# Patient Record
Sex: Female | Born: 1980 | Race: Black or African American | Hispanic: No | Marital: Married | Smoking: Never smoker
Health system: Southern US, Community
[De-identification: ages and names within clinical notes are randomized; demographics above are authoritative.]

## PROBLEM LIST (undated history)

## (undated) ENCOUNTER — Inpatient Hospital Stay (HOSPITAL_COMMUNITY): Payer: Self-pay

## (undated) DIAGNOSIS — I10 Essential (primary) hypertension: Secondary | ICD-10-CM

## (undated) DIAGNOSIS — F419 Anxiety disorder, unspecified: Secondary | ICD-10-CM

## (undated) DIAGNOSIS — L732 Hidradenitis suppurativa: Secondary | ICD-10-CM

## (undated) DIAGNOSIS — L0390 Cellulitis, unspecified: Secondary | ICD-10-CM

## (undated) DIAGNOSIS — E669 Obesity, unspecified: Secondary | ICD-10-CM

## (undated) DIAGNOSIS — M109 Gout, unspecified: Secondary | ICD-10-CM

## (undated) DIAGNOSIS — I878 Other specified disorders of veins: Secondary | ICD-10-CM

## (undated) DIAGNOSIS — F32A Depression, unspecified: Secondary | ICD-10-CM

## (undated) DIAGNOSIS — F329 Major depressive disorder, single episode, unspecified: Secondary | ICD-10-CM

## (undated) DIAGNOSIS — I872 Venous insufficiency (chronic) (peripheral): Secondary | ICD-10-CM

## (undated) HISTORY — DX: Depression, unspecified: F32.A

## (undated) HISTORY — DX: Hidradenitis suppurativa: L73.2

## (undated) HISTORY — PX: TUBAL LIGATION: SHX77

## (undated) HISTORY — DX: Major depressive disorder, single episode, unspecified: F32.9

## (undated) SURGERY — Surgical Case
Anesthesia: Regional | Laterality: Bilateral

---

## 2004-11-30 ENCOUNTER — Ambulatory Visit (HOSPITAL_COMMUNITY): Admission: RE | Admit: 2004-11-30 | Discharge: 2004-11-30 | Payer: Self-pay | Admitting: Obstetrics and Gynecology

## 2004-12-03 ENCOUNTER — Observation Stay (HOSPITAL_COMMUNITY): Admission: AD | Admit: 2004-12-03 | Discharge: 2004-12-03 | Payer: Self-pay | Admitting: Obstetrics & Gynecology

## 2004-12-04 ENCOUNTER — Inpatient Hospital Stay (HOSPITAL_COMMUNITY): Admission: AD | Admit: 2004-12-04 | Discharge: 2004-12-12 | Payer: Self-pay | Admitting: Obstetrics & Gynecology

## 2004-12-07 ENCOUNTER — Encounter: Payer: Self-pay | Admitting: *Deleted

## 2004-12-09 ENCOUNTER — Ambulatory Visit: Payer: Self-pay | Admitting: *Deleted

## 2005-08-01 ENCOUNTER — Emergency Department (HOSPITAL_COMMUNITY): Admission: EM | Admit: 2005-08-01 | Discharge: 2005-08-01 | Payer: Self-pay | Admitting: Emergency Medicine

## 2005-10-09 ENCOUNTER — Emergency Department (HOSPITAL_COMMUNITY): Admission: EM | Admit: 2005-10-09 | Discharge: 2005-10-09 | Payer: Self-pay | Admitting: Emergency Medicine

## 2005-12-02 ENCOUNTER — Inpatient Hospital Stay (HOSPITAL_COMMUNITY): Admission: RE | Admit: 2005-12-02 | Discharge: 2005-12-04 | Payer: Self-pay | Admitting: Obstetrics and Gynecology

## 2005-12-06 ENCOUNTER — Observation Stay (HOSPITAL_COMMUNITY): Admission: AD | Admit: 2005-12-06 | Discharge: 2005-12-06 | Payer: Self-pay | Admitting: Obstetrics and Gynecology

## 2006-01-07 ENCOUNTER — Ambulatory Visit (HOSPITAL_COMMUNITY): Admission: AD | Admit: 2006-01-07 | Discharge: 2006-01-07 | Payer: Self-pay | Admitting: Obstetrics and Gynecology

## 2006-01-15 ENCOUNTER — Ambulatory Visit (HOSPITAL_COMMUNITY): Admission: RE | Admit: 2006-01-15 | Discharge: 2006-01-15 | Payer: Self-pay | Admitting: Obstetrics and Gynecology

## 2006-01-17 ENCOUNTER — Ambulatory Visit (HOSPITAL_COMMUNITY): Admission: AD | Admit: 2006-01-17 | Discharge: 2006-01-17 | Payer: Self-pay | Admitting: Obstetrics and Gynecology

## 2006-01-19 ENCOUNTER — Inpatient Hospital Stay (HOSPITAL_COMMUNITY): Admission: RE | Admit: 2006-01-19 | Discharge: 2006-01-20 | Payer: Self-pay | Admitting: Obstetrics and Gynecology

## 2006-01-27 ENCOUNTER — Ambulatory Visit (HOSPITAL_COMMUNITY): Admission: RE | Admit: 2006-01-27 | Discharge: 2006-01-27 | Payer: Self-pay | Admitting: Obstetrics and Gynecology

## 2006-02-09 ENCOUNTER — Ambulatory Visit (HOSPITAL_COMMUNITY): Admission: AD | Admit: 2006-02-09 | Discharge: 2006-02-09 | Payer: Self-pay | Admitting: Obstetrics and Gynecology

## 2006-02-11 ENCOUNTER — Observation Stay (HOSPITAL_COMMUNITY): Admission: AD | Admit: 2006-02-11 | Discharge: 2006-02-11 | Payer: Self-pay | Admitting: Obstetrics and Gynecology

## 2006-02-12 ENCOUNTER — Inpatient Hospital Stay (HOSPITAL_COMMUNITY): Admission: AD | Admit: 2006-02-12 | Discharge: 2006-02-15 | Payer: Self-pay | Admitting: Obstetrics and Gynecology

## 2006-02-13 ENCOUNTER — Encounter (INDEPENDENT_AMBULATORY_CARE_PROVIDER_SITE_OTHER): Payer: Self-pay | Admitting: Specialist

## 2006-05-20 ENCOUNTER — Emergency Department (HOSPITAL_COMMUNITY): Admission: EM | Admit: 2006-05-20 | Discharge: 2006-05-21 | Payer: Self-pay | Admitting: Emergency Medicine

## 2007-08-01 ENCOUNTER — Emergency Department (HOSPITAL_COMMUNITY): Admission: EM | Admit: 2007-08-01 | Discharge: 2007-08-01 | Payer: Self-pay | Admitting: Emergency Medicine

## 2007-12-25 ENCOUNTER — Emergency Department (HOSPITAL_COMMUNITY): Admission: EM | Admit: 2007-12-25 | Discharge: 2007-12-26 | Payer: Self-pay | Admitting: Emergency Medicine

## 2008-04-01 ENCOUNTER — Emergency Department (HOSPITAL_COMMUNITY): Admission: EM | Admit: 2008-04-01 | Discharge: 2008-04-01 | Payer: Self-pay | Admitting: Emergency Medicine

## 2010-07-01 ENCOUNTER — Other Ambulatory Visit: Payer: Self-pay

## 2010-07-01 ENCOUNTER — Other Ambulatory Visit: Payer: Self-pay | Admitting: Emergency Medicine

## 2010-07-02 ENCOUNTER — Ambulatory Visit: Payer: Self-pay | Admitting: Advanced Practice Midwife

## 2010-07-02 ENCOUNTER — Inpatient Hospital Stay (HOSPITAL_COMMUNITY): Admission: AD | Admit: 2010-07-02 | Discharge: 2010-07-02 | Payer: Self-pay | Admitting: Obstetrics & Gynecology

## 2010-07-15 ENCOUNTER — Ambulatory Visit: Payer: Self-pay | Admitting: Advanced Practice Midwife

## 2010-07-15 ENCOUNTER — Inpatient Hospital Stay (HOSPITAL_COMMUNITY): Admission: AD | Admit: 2010-07-15 | Discharge: 2010-07-15 | Payer: Self-pay | Admitting: Obstetrics & Gynecology

## 2010-08-05 ENCOUNTER — Emergency Department (HOSPITAL_COMMUNITY): Admission: EM | Admit: 2010-08-05 | Discharge: 2010-08-05 | Payer: Self-pay | Admitting: Emergency Medicine

## 2010-08-11 ENCOUNTER — Ambulatory Visit: Payer: Self-pay | Admitting: Obstetrics & Gynecology

## 2010-08-11 ENCOUNTER — Inpatient Hospital Stay (HOSPITAL_COMMUNITY): Admission: AD | Admit: 2010-08-11 | Discharge: 2010-08-14 | Payer: Self-pay | Admitting: Obstetrics & Gynecology

## 2010-12-16 ENCOUNTER — Encounter: Payer: Self-pay | Admitting: Family Medicine

## 2011-02-08 LAB — GLUCOSE, CAPILLARY
Glucose-Capillary: 118 mg/dL — ABNORMAL HIGH (ref 70–99)
Glucose-Capillary: 126 mg/dL — ABNORMAL HIGH (ref 70–99)
Glucose-Capillary: 130 mg/dL — ABNORMAL HIGH (ref 70–99)
Glucose-Capillary: 134 mg/dL — ABNORMAL HIGH (ref 70–99)
Glucose-Capillary: 135 mg/dL — ABNORMAL HIGH (ref 70–99)
Glucose-Capillary: 137 mg/dL — ABNORMAL HIGH (ref 70–99)
Glucose-Capillary: 146 mg/dL — ABNORMAL HIGH (ref 70–99)
Glucose-Capillary: 160 mg/dL — ABNORMAL HIGH (ref 70–99)
Glucose-Capillary: 164 mg/dL — ABNORMAL HIGH (ref 70–99)
Glucose-Capillary: 94 mg/dL (ref 70–99)

## 2011-02-08 LAB — CBC
HCT: 27.2 % — ABNORMAL LOW (ref 36.0–46.0)
Hemoglobin: 9 g/dL — ABNORMAL LOW (ref 12.0–15.0)
Hemoglobin: 9.8 g/dL — ABNORMAL LOW (ref 12.0–15.0)
MCH: 27 pg (ref 26.0–34.0)
MCH: 27.5 pg (ref 26.0–34.0)
MCHC: 33.1 g/dL (ref 30.0–36.0)
MCV: 83.8 fL (ref 78.0–100.0)
Platelets: 193 10*3/uL (ref 150–400)
Platelets: 241 10*3/uL (ref 150–400)
RBC: 3.27 MIL/uL — ABNORMAL LOW (ref 3.87–5.11)
RBC: 3.63 MIL/uL — ABNORMAL LOW (ref 3.87–5.11)
WBC: 9.5 10*3/uL (ref 4.0–10.5)

## 2011-02-08 LAB — URINALYSIS, ROUTINE W REFLEX MICROSCOPIC
Bilirubin Urine: NEGATIVE
Ketones, ur: NEGATIVE mg/dL
Nitrite: NEGATIVE
Protein, ur: NEGATIVE mg/dL
Urobilinogen, UA: 0.2 mg/dL (ref 0.0–1.0)

## 2011-02-08 LAB — WET PREP, GENITAL
Clue Cells Wet Prep HPF POC: NONE SEEN
Trich, Wet Prep: NONE SEEN
Trich, Wet Prep: NONE SEEN
Yeast Wet Prep HPF POC: NONE SEEN
Yeast Wet Prep HPF POC: NONE SEEN

## 2011-02-08 LAB — URINE MICROSCOPIC-ADD ON

## 2011-02-09 LAB — URINE CULTURE: Colony Count: >=100000

## 2011-02-09 LAB — DIFFERENTIAL
Basophils Absolute: 0 10*3/uL (ref 0.0–0.1)
Eosinophils Absolute: 0.2 10*3/uL (ref 0.0–0.7)
Lymphocytes Relative: 22 % (ref 12–46)
Lymphs Abs: 1.7 10*3/uL (ref 0.7–4.0)
Neutrophils Relative %: 68 % (ref 43–77)

## 2011-02-09 LAB — URINE MICROSCOPIC-ADD ON

## 2011-02-09 LAB — URINALYSIS, ROUTINE W REFLEX MICROSCOPIC
Ketones, ur: NEGATIVE mg/dL
Nitrite: NEGATIVE
Specific Gravity, Urine: 1.02 (ref 1.005–1.030)
Urobilinogen, UA: 1 mg/dL (ref 0.0–1.0)
pH: 6.5 (ref 5.0–8.0)

## 2011-02-09 LAB — BASIC METABOLIC PANEL
Calcium: 8.5 mg/dL (ref 8.4–10.5)
Creatinine, Ser: 0.49 mg/dL (ref 0.4–1.2)
GFR calc Af Amer: >60 mL/min (ref >60)

## 2011-02-09 LAB — CBC
Platelets: 219 10*3/uL (ref 150–400)
RBC: 3.1 MIL/uL — ABNORMAL LOW (ref 3.87–5.11)
WBC: 7.6 10*3/uL (ref 4.0–10.5)

## 2011-02-09 LAB — ABO/RH: ABO/RH(D): B POS

## 2011-02-09 LAB — GLUCOSE, CAPILLARY: Glucose-Capillary: 81 mg/dL (ref 70–99)

## 2011-04-13 NOTE — Discharge Summary (Signed)
Theresa Malone, Theresa Malone                 ACCOUNT NO.:  [id number]   MEDICAL RECORD NO.:  [id number]          PATIENT TYPE:  INP   LOCATION:  9103                          FACILITY:  [facility]   PHYSICIAN:  Adrian Blackwater, MDDATE OF BIRTH:  04-27-1981   DATE OF ADMISSION:  12/06/2004  DATE OF DISCHARGE:  12/12/2004                                 DISCHARGE SUMMARY   ADMISSION DIAGNOSES:  1.  A 33-1/7 week intrauterine pregnancy.  2.  Abdominal pain.  3.  Maternal fever.   DISCHARGE DIAGNOSES:  1.  Normal spontaneous vaginal delivery at 33 weeks and four days      gestational age.  2.  Gestational diabetes mellitus.  3.  Pregnancy induced hypertension.   DISCHARGE MEDICATIONS:  1.  Ibuprofen 600 mg q.6h. p.r.n.  2.  Prenatal vitamins p.o. one daily.  3.  Micronor birth control.   BRIEF HOSPITAL COURSE:  This is a 30 year old G1, para 1-0-0-1 who was  admitted with preterm contractions after being referred by [facility] with  positive fetal fibronectin, previous terbutaline treatment with contractions  and presenting UTI, treated with Keflex.  She was transferred to [facility] with suspicion of chorioamnionitis and preterm contractions.  Patient also _________ gestational diabetes for which she was using insulin  for control and also developed PIH that was closely monitored during  hospitalization.   PROBLEM #1 -  PRETERM CONTRACTIONS:  The patient was placed on continued  monitoring which showed occasional uterine irritability but no contractions  but having continued abdominal pain until December 09, 2004, at six in the  morning when patient spontaneously ruptured membranes.   PROBLEM #2 -  POSSIBLE CHORIOAMNIONITIS DUE TO MATERNAL FEVER AND SUBJECTIVE  ABDOMINAL TENDERNESS WITH VERY VIABLE ABDOMINAL PHYSICAL EXAMINATION AND NO  LOCALIZED POINT OF TENDERNESS:  Patient was placed on a ________ treatment 3  g q.6h. during hospital course.  It was changed to triple  antibiotic,  ampicillin, gentamicin and clindamycin, to continue treatment up to 24 hours  post delivery after blood cultures negative and patient being afebrile.   PROBLEM #3 -  GESTATIONAL DIABETES:  During hospital course the patient  presented with episodes of hypoglycemia that required to stop insulin  treatments.  The patient was discharged with no pharmaceutical treatment for  the diabetes but low carbohydrate diet.   PROBLEM #4 -  PREGNANCY INDUCED HYPERTENSION:  Blood pressure remained  stable during hospital course with normal values.  PIH panel done showed  some abnormalities.  The patient was placed on magnesium therapy as  prevention and therapy continued through 24 hours post delivery.  When  patient shows good diuresis and no symptoms of toxicity on toxic level.   PROCEDURES:  Abdominal and pelvic CT scan with contrast done on December 07, 2004, shows no appendicitis and no other focal abnormalities noted.   LABORATORY DATA:  Admission urinalysis with white blood cells 21 to 50,  small leukocyte esterase.  GBS negative.  White blood cells 8.9, hemoglobin  9.7, hematocrit 28.9, platelets 244.  Sodium 138, potassium 3.5, chloride  111, CO2  20, BUN 6, creatinine 0.8, glucose 111.  Lipase 26, amylase 92.  On  December 08, 2004, wbc 6.9, hemoglobin 9.7, platelets 257.  Creatinine  increased to 0.9.  Uric acid 8.  Liver function tests within normal limits.   DISCHARGE LABORATORY DATA:  Hemoglobin A 96.8, hemoglobin A2 2.6 indicating  possible iron-deficiency anemia.  Urine culture and blood culture negative.  Hemoglobin 9.  AST 20 ALT 13, LDH 177.  _________ 6.5, creatinine 0.7.   CONTRACEPTION:  Micronor pills.   The patient will be in follow-up appointment at [facility] in six weeks.      IM/MEDQ  D:  12/14/2004  T:  12/14/2004  Job:  [id number]

## 2011-04-13 NOTE — H&P (Signed)
[name], [name]                 ACCOUNT NO.:  [id number]   MEDICAL RECORD NO.:  [id number]          PATIENT TYPE:  OIB   LOCATION:  LDR3                          FACILITY:  [facility]   PHYSICIAN:  Tilda Burrow, M.D. DATE OF BIRTH:  10/26/1981   DATE OF ADMISSION:  12/06/2005  DATE OF DISCHARGE:  LH                                HISTORY & PHYSICAL   OBSERVATION NOTE   REASON FOR ADMISSION:  Theresa Malone is a 30 year old gravida 2, para 1, previous  Cesarean section X1 who has a history this week of prior hospitalization for  preterm labor which was arrested.  She was admitted last night with  complaints of right sided pain.  Upon arrival at the hospital and monitoring  for approximately one hour there was no contractions noted and repeat  vaginal examination reveals that there is no change in her cervix.   PHYSICAL EXAMINATION:  VITAL SIGNS:  Stable.  Fetal heart rate pattern is  stable.  PELVIC:  Cervix is unchanged.   PLAN:  We will discharge her home to continue her p.o. Brethine at home.  She is to follow up with Dr. Emelda Fear in the morning for evaluation.      Theresa Malone, Theresa Malone      Tilda Burrow, M.D.  Electronically Signed    DL/MEDQ  D:  82/95/6213  T:  12/06/2005  Job:  [id number]

## 2011-04-13 NOTE — H&P (Signed)
NAMEDRAVEN, LAINE                 ACCOUNT NO.:  [id number]   MEDICAL RECORD NO.:  [id number]          PATIENT TYPE:  OIB   LOCATION:  A415                          FACILITY:  [facility]   PHYSICIAN:  Tilda Burrow, M.D. DATE OF BIRTH:  05/09/1981   DATE OF ADMISSION:  01/07/2006  DATE OF DISCHARGE:  LH                                HISTORY & PHYSICAL   REASON FOR ADMISSION:  Pregnancy at 28 weeks with pelvic pressure.   HISTORY OF PRESENT ILLNESS:  Blia is a 30 year old, gravida 2, para 1, EDC  of February 13, 2006, at 34 weeks.   MEDICAL HISTORY:  Positive for gestational diabetes.   SURGICAL HISTORY:  Negative.   ALLERGIES:  No known allergies.   PHYSICAL EXAMINATION:  VITAL SIGNS:  Stable.  Her pulse is a little  irregular, but I think it is due to the fact that she is very excited.  PELVIC:  Cervix is 1-2 outer os, 1 inner os.  Presenting part is high.  The  cervix is long.  Fetal heart rate pattern is stable.   PLAN:  We are going to observe.  If no cervical change in 1-2 hours, will  discharge her home.      [name], [name]      Tilda Burrow, M.D.  Electronically Signed    DL/MEDQ  D:  04/54/0981  T:  01/07/2006  Job:  [id number]   cc:   [facility]

## 2011-04-13 NOTE — Consult Note (Signed)
NAMEALACIA, REHMANN                 ACCOUNT NO.:  [id number]   MEDICAL RECORD NO.:  [id number]          PATIENT TYPE:  INP   LOCATION:  A413                          FACILITY:  [facility]   PHYSICIAN:  Tilda Burrow, M.D. DATE OF BIRTH:  November 14, 1981   DATE OF CONSULTATION:  DATE OF DISCHARGE:  02/15/2006                                   CONSULTATION   CHIEF COMPLAINT:  Rule out labor.   HISTORY OF PRESENT ILLNESS:  This 30 year old female gravida 2, para 1,  prior preterm vaginal delivery of a 34-week gestation infant is placed in  Labor and Delivery to rule out labor.  She was seen in the office by Dr.  Despina Hidden and sent to Labor and Delivery where monitoring shows some occasional  mild contractions.  Cervix is posterior, long and closed.  The patient has  lots of complaints with pelvic discomfort, and that is the main reason she  is sent to Labor and Delivery.  External monitoring upon arrival shows only  an occasional mild contraction.  She is observed.  Fetal heart rate tracing  meets criteria for reactive __________.  The patient is observed through the  day, but makes no cervical change, and the pain essentially resolves with  continued bed rest.  After reassessment at the end of the work day (7:30  p.m.), the patient was considered stable for outpatient management and to be  brought back to the office in two days to recheck the cervix.   IMPRESSION:  False labor at 39 weeks, 5 days.      Tilda Burrow, M.D.  Electronically Signed     JVF/MEDQ  D:  02/18/2006  T:  02/19/2006  Job:  [id number]

## 2011-04-13 NOTE — H&P (Signed)
Theresa Malone, Theresa Malone                 ACCOUNT NO.:  [id number]   MEDICAL RECORD NO.:  [id number]          PATIENT TYPE:  INP   LOCATION:  LDR1                          FACILITY:  [facility]   PHYSICIAN:  Tilda Burrow, M.D. DATE OF BIRTH:  1980-12-31   DATE OF ADMISSION:  02/12/2006  DATE OF DISCHARGE:  LH                                HISTORY & PHYSICAL   CHIEF COMPLAINT:  Labor.   HISTORY OF PRESENT ILLNESS:  Theresa Malone is a 30 year old gravida 2, para 1 with  an EDC of February 13, 2006 placing her at 39 weeks 6 days' gestation. She  began prenatal care in her first trimester and has had regular visits since  then.   PRENATAL LABORATORY DATA:  Blood type B positive. Rubella is immune. HBSAG,  HIV, RPR,  gonorrhea, chlamydia and group B strep are all negative. She is  also negative for HSV-2. She had an abnormal one-hour GTT with a subsequent  normal three-hour GTT. MSAFP was also within normal limits.   Blood pressures have been 120s to 130s/60s to 80s. Total weight gain has  been 16 pounds. Her fundal height has actually been above normal limits. She  received serial ultrasounds starting at 25 weeks for amniotic fluid indices  and estimated fetal weight due to the increased fundal height; at 32 weeks,  estimated fetal weight was 5 pounds 8 ounce with normal AFI. She had a  repeat ultrasound at 38 weeks which placed her at the 90th to 99th  percentile in weight of an 8 pound 2 ounce baby. Also with a normal amniotic  fluid index. Fundal height is 42 cm.   PAST MEDICAL HISTORY:  Positive for hidradenitis on her body, under her  breasts and the genital area. Pregnancy has worsened it. She has taken  antibiotics from time to time.   PAST SURGICAL HISTORY:  No surgical history.   ALLERGIES:  No known drug allergies.   MEDICATIONS:  No medications at this time.   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 She  denies cigarette smoking, alcohol or drug use.   FAMILY HISTORY:  Noncontributory.   OBSTETRICAL HISTORY:  Positive for a vaginal delivery on January 09, 2005  of a 5-pound, 6-ounce female infant at 32 weeks due to an intra-amniotic  infection.   PHYSICAL EXAMINATION:  HEENT:  Within normal limits.  HEART:  Regular rate and rhythm.  LUNGS:  Clear.  ABDOMEN:  Soft and nontender.   She is having some mild irregular contractions. Fetal heart rate is in the  140s to 150s with average long-term variability. We have not seen any  accelerations at this point, and she does have an occasional very mild less  than 10 second variable. Cervical exam is 3 cm, 50% effaced, -2 station  which is a change from her exam yesterday.   As noted before, she does have multiple weeping areas of the hidradenitis.  She did receive some IV antibiotics for that yesterday. Legs are trace  edema.   IMPRESSION:  Intrauterine pregnancy at 39 weeks  6 days, prodromal labor.   PLAN:  We will plan an amniotomy and Pitocin to augment her labor. She also  is requesting an epidural at this time.      Theresa Malone, C.N.M.      Tilda Burrow, M.D.  Electronically Signed    FC/MEDQ  D:  02/12/2006  T:  02/12/2006  Job:  [id number]   cc:   [facility]   Francoise Schaumann. Milford Cage DO, FAAP  Fax: [phone]

## 2011-04-13 NOTE — Discharge Summary (Signed)
NAMEANTONAE, Theresa Malone                 ACCOUNT NO.:  [id number]   MEDICAL RECORD NO.:  [id number]          PATIENT TYPE:  INP   LOCATION:  LDR4                          FACILITY:  [facility]   PHYSICIAN:  Tilda Burrow, M.D. DATE OF BIRTH:  1981-10-11   DATE OF ADMISSION:  12/02/2005  DATE OF DISCHARGE:  LH                                 DISCHARGE SUMMARY   ADMISSION DIAGNOSIS:  Pregnancy at 29 weeks with preterm labor.   DISCHARGE DIAGNOSES:  1.  Pregnancy at 29 weeks with preterm labor.  2.  Gestational diabetes.   HOSPITAL COURSE:  Theresa Malone responded well with mag sulfate therapy and was  discontinued on put on p.o. Brethine which she continued to do well on that.  She is now on 5 of Brethine q.4h. which she states it makes her feel really  bad, so we are going to cut her dose to 2.5 unless she has breakthrough  contractions of which she is to go up to 5. Also, she has had diabetic  teaching. She is to be discharged with Brethine 2.5 p.o. q.4h., a  prescription for her Glucometer lancets, strips and supplies. She is to  monitor her glucose fasting upon arising every morning and two hours post  meals, keep a log of that, and she is to follow up with Dr. Emelda Fear Friday  in the office. Also, she is to have preterm labor cautions. She is not to  work. She is to have modified bedrest.   DISCHARGE MEDICATIONS:  Brethine 2.5 p.o. q.4h.   Preterm labor precautions are going to be issued, and she is to notify the  office or the hospital if she has any regular contractions greater than 5 in  an hour.      [name], [name]      Tilda Burrow, M.D.  Electronically Signed    DL/MEDQ  D:  98/09/9146  T:  12/04/2005  Job:  [id number]   cc:   [facility]   Francoise Schaumann. Milford Cage DO, FAAP  Fax: [phone]

## 2011-04-13 NOTE — H&P (Signed)
NAMEDENALY, GATLING                 ACCOUNT NO.:  [id number]   MEDICAL RECORD NO.:  [id number]          PATIENT TYPE:  INP   LOCATION:  LDR3                          FACILITY:  [facility]   PHYSICIAN:  Lazaro Arms, M.D.   DATE OF BIRTH:  10/25/1981   DATE OF ADMISSION:  01/19/2006  DATE OF DISCHARGE:  LH                                HISTORY & PHYSICAL   HISTORY OF PRESENT ILLNESS:  Theresa Malone is 30 year old African-American female  gravida 2, para 1, estimated date of delivery of February 13, 2006 currently at  35-3/[redacted] weeks gestation who came in complaining of nausea, vomiting, and  diarrhea.  She states she has had multiple episodes of each and also has  been dizzy-headed at home.  She was admitted, underwent IV hydration, her  hemoglobin was 10.4, hematocrit 30.6, white count was 10,900 with an  unremarkable differential, her comprehensive metabolic panel was completely  normal, her BUN does not point to dehydration since it was only 2 and her  albumin was slightly low but pretty normal for a pregnant lady.  As a  result, she will be admitted for IV hydration and observation.   PAST MEDICAL HISTORY:  History of gestational diabetes.   PAST SURGICAL HISTORY:  Negative.   PAST OBSTETRICAL HISTORY:  Vaginal delivery back in 2006 at [place].   PRENATAL LABORATORIES:  All of her prenatal labs are unremarkable.   IMPRESSION:  1.  Intrauterine pregnancy at 35-1/[redacted] weeks gestation.  2.  Viral gastroenteritis.   PLAN:  Patient admitted for IV hydration and evaluation.      Lazaro Arms, M.D.  Electronically Signed     LHE/MEDQ  D:  01/20/2006  T:  01/20/2006  Job:  [id number]

## 2011-04-13 NOTE — Discharge Summary (Signed)
NAMEPERCILLA, Theresa Malone                 ACCOUNT NO.:  [id number]   MEDICAL RECORD NO.:  [id number]          PATIENT TYPE:  OIB   LOCATION:  LDR2                          FACILITY:  [facility]   PHYSICIAN:  Langley Gauss, MD     DATE OF BIRTH:  [birth year]   DATE OF ADMISSION:  12/03/2004  DATE OF DISCHARGE:  01/08/2006LH                                 DISCHARGE SUMMARY   This is a 30 year old, gravida 1, para 0, at 29 weeks' gestation, who  presents to [facility] with the chief complaint of low pelvic pain,  onset p.m., December 02, 2004.  The patient is on p.o. terbutaline.  She  states she initially started having some vague pelvic pressure, was  continuing to take her p.o. terbutaline every 4 hours.  She did take a dose  at 2330 of December 02, 2004, as prescribed; however, this failed to have any  significant therapeutic result, such that the pelvic pressure as well as  some occasional tightening and loosening increased.  When she presented, she  continued to complain of these symptoms as well as some shooting back pain,  denied any vaginal bleeding, denied any leakage of fluid, denies any change  in vaginal discharge, no pain in her bowel habits and no change in the  prescriptions.  No recent sexual activity.  Prenatal course has been  complicated by a visit to [facility] for an office visit 2 days previously,  at which time patient was started on p.o. terbutaline and as a result of the  history she provided, she did not require monitoring at [facility].  She states that she had good response with the p.o. terbutaline at home  until this most recent episode.  History is not available to me, but patient  provides a history that some type of test was performed in their office  which subsequently came back positive and thus was also a deciding factor in  putting her on p.o. terbutaline.  Best conclusion I can make based on this  vague history is that the patient had a positive  fetal fibronectin test.  She has been somewhat compliant with the modified bed rest.  The patient's  prenatal course also complicated by findings of gestational diabetes.  Currently she is on dietary changes only.  Notation indicates that she has  had a blood sugar of 225 and based on this, she increased her home blood  glucose monitoring.   PAST MEDICAL HISTORY:  1.  In 1991, she had left arm surgery for boils.  2.  Otherwise, no other significant medical or surgical history.  3.  No prior history of diabetes prior to this diagnosis of gestational      diabetes.   FAMILY HISTORY:  Per the patient is significant for her mom also being  gestational diabetic and delivering by C-section a 9+ pound baby at 37  weeks.   The patient's entire prenatal record is reviewed which reveals a previous  hemoglobin of 11.5, hepatitis negative, HIV negative.  RPR is nonreactive.  Pap smear is class I.  Negative GC and Chlamydia cultures.  The patient did  decline the maternal serum AFP screening.   PHYSICAL EXAMINATION:  GENERAL:  She is noted to be obese, pleasant,  adolescent female.  VITAL SIGNS:  Temperature 98.9, pulse 124, respiratory rate 24, blood  pressure 134/81.  HEENT:  Negative.  No adenopathy.  NECK:  Supple.  Thyroid is nonpalpable.  LUNGS:  Clear.  CARDIOVASCULAR:  Regular rhythm.  ABDOMEN:  Soft and nontender.  Gravid uterus identified.  Panniculus is  noted.  Fundal height is 34 cm.  She is vertex presentation by Leopold's  maneuvers.  EXTREMITIES:  Normal.  PELVIC EXAM:  Normal external genitalia.  No lesions or ulcerations  identified.  No vaginal bleeding, no leakage of fluid.  External fetal  monitor does reveal reassuring fetal heart rate with fetal heart rate  baseline of 155 with an average long-term variability.  No decelerations are  noted.  Most pertinently, the toco for monitoring of uterine contractions  reveals initial pattern which is most consistent with urinary  irritability.  Cervix is noted to be closed with the presenting part not engaged.   INITIAL IMPRESSION:  Pelvic pressure and pelvic pain.  No uterine activity  identified.  Cervix closed.  However, due to the patient's prior history of  tightening and her positive fetal fibronectin, she is continued for close  evaluation and monitoring.  During the night, she was noted to have several  episodes of what appeared to be trace apparent uterine contractions;  however, she continued to have no cervical change.  She was treated with  subcu terbutaline x 1 which gave no significant results.  Best results were  obtained by giving the patient 2 p.o. Tylox which she states had complete  resolution/cessation of her uterine discomfort.  Subsequent review of her  laboratory studies revealed presence of moderate ketones upon her initial  presentation.  Thus, with return of what appeared to be uterine-type  activity with contractions on external fetal monitor, the patient was  treated with IV fluid, 1 liter of D5 LR as well as 2 g of IV morphine.  On  this regimen, the patient did very well.  She was able to rest.  She had  near complete cessation of pelvic pressure, pelvic tightening, or any  semblance of uterine activity.  This was confirmed by review of the fetal  monitor strip.  Cervix is again examined.  Cervix is noted to be closed.  There is a narrow mid pelvis noted.  Presenting part is not engaged but  seemingly is vertex.  Laboratory studies obtained did reveal a very good  clean catch with only rare epithelial cells, few bacteria, 21-50 white  cells, small leukocyte esterase.  Thus, patient is treated empirically based  on these with Keflex 500 mg p.o. b.i.d. x 7 days.   DISCHARGE MEDICATIONS:  1.  The patient is to continue with the p.o. terbutaline q.4h.  2.  Keflex 500 mg p.o. b.i.d. x 7 days. 3.  Tylox 1-2 q.4-6h. p.r.n. abdominal discomfort with pregnancy #40 with no      refill.    FINAL DIAGNOSES:  1.  A 32+ week intrauterine pregnancy.  2.  Threatened preterm labor.  3.  Gestational diabetes, currently dietary controlled only.  4.  Urinary tract infection.     Vira Blanco   DC/MEDQ  D:  12/03/2004  T:  12/03/2004  Job:  [id number]

## 2011-04-13 NOTE — Discharge Summary (Signed)
Theresa Malone, Theresa Malone                 ACCOUNT NO.:  [id number]   MEDICAL RECORD NO.:  [id number]          PATIENT TYPE:  OIB   LOCATION:  LDR3                          FACILITY:  [facility]   PHYSICIAN:  Tilda Burrow, M.D. DATE OF BIRTH:  04/03/1981   DATE OF ADMISSION:  12/06/2005  DATE OF DISCHARGE:  01/11/2007LH                                 DISCHARGE SUMMARY   REASON FOR ADMISSION:  Theresa Malone is a 30 year old gravida 2, para 1, previous  Cesarean section at 29 weeks who came in with right sided pain last night.  She was hospitalized previously this past week for preterm labor which was  arrested.  She is on Brethine p.o. and she is to follow up with Dr. Emelda Fear  in the morning.   HOSPITAL COURSE:  After getting here Theresa Malone settled down and went to sleep  and slept during the night and was awakened by myself at approximately 08:15  this morning.  On physical examination her vital signs are stable.  Cervix  is unchanged.   PLAN:  Will discharge her home on her previous medications and she is to see  Dr. Emelda Fear in the morning for evaluation.      Theresa Malone, Theresa Malone      Tilda Burrow, M.D.  Electronically Signed    DL/MEDQ  D:  60/45/4098  T:  12/06/2005  Job:  [id number]

## 2011-04-13 NOTE — H&P (Signed)
NAMEPARISH, AUGUSTINE                 ACCOUNT NO.:  [id number]   MEDICAL RECORD NO.:  [id number]          PATIENT TYPE:  INP   LOCATION:  A415                          FACILITY:  [facility]   PHYSICIAN:  Lazaro Arms, M.D.   DATE OF BIRTH:  Nov 06, 1981   DATE OF ADMISSION:  12/04/2004  DATE OF DISCHARGE:  LH                                HISTORY & PHYSICAL   REASON FOR ADMISSION:  Pregnancy at 32 weeks and 6 days with preterm  contractions and fever of 102.8.   HISTORY OF PRESENT ILLNESS:  Dorrie was discharged home Sunday from the  hospital for preterm labor.  She was given Brethine, Keflex 500 p.o. q.i.d.  and also some pain medicines for the discomfort.  She states that she did  fine up until last night.  She started hurting really bad last night.  She  got a little better this morning, but then again she started feeling worse  during the day and started chilling at that time.  She was not aware at the  time she had a fever.   PAST MEDICAL HISTORY:  Negative.   PAST SURGICAL HISTORY:  Positive for right arm surgery in 1999.   PRENATAL HISTORY:  She was transferred from [facility] for  gestational diabetes.  Blood type is B positive.  Hepatitis B surface  antigen negative, HIV negative, serologies nonreactive.  Pap smears normal.  GC and chlamydia are negative.  She declined AFP.  Her 1-hour glucose was  elevated.  Also her 3-hour glucoses were elevated, and she was transferred  to [place].  We saw her for the first time on September 1.  At that time she  attended from August 11 until August 28 and then transferred to [facility] and  came back to [place] on January 5.  At that time, fundal height was 40 cm at 31  weeks, and the patient was screened for possible preterm labor due to  macrosomia, and she had a positive AFP.   PHYSICAL EXAMINATION TODAY:  VITAL SIGNS:  Weight is 242 pounds.  Blood  pressure 138/80, temperature 102.8, apical pulse in the 150s, respirations  28.  Fetal  heart rate is in the 170s.  Fundal height is 42 cm.  She has  blood, leukocytes and 3+ in her urine.  Blood sugar was 61.  Heart rate was  regular rhythm and rate.  LUNGS:  Clear to auscultation bilaterally.   GC and chlamydia were obtained.  Also GBS.  The patient consulted with Dr.  Despina Hidden and exam done.  The patient was 1 cm thick, funneling and presenting  part high.   PLAN:  We are going to admit, get screening labs, electronic fetal  monitoring, a cath UA, IV antibiotics and observe.  Dr. Despina Hidden is aware and is  in agreement with the patient's plan of care.     Darl   DL/MEDQ  D:  16/08/9603  T:  12/04/2004  Job:  [id number]   cc:   [facility]

## 2011-04-13 NOTE — Op Note (Signed)
Theresa Malone, STRUVE                 ACCOUNT NO.:  [id number]   MEDICAL RECORD NO.:  [id number]          PATIENT TYPE:  INP   LOCATION:  A413                          FACILITY:  [facility]   PHYSICIAN:  Tilda Burrow, M.D. DATE OF BIRTH:  1981/07/28   DATE OF PROCEDURE:  02/13/2006  DATE OF DISCHARGE:  02/15/2006                                 OPERATIVE REPORT   PREOP DIAGNOSIS:  1.  Pregnancy at 40 weeks' gestation.  2.  Failure to progress ( cephalopelvic disproportion),   POSTOPERATIVE DIAGNOSES:  1.  Pregnancy at 40 weeks' gestation.  2.  Failure to progress (cephalopelvic disproportion),   PROCEDURE:  Primary low transverse cervical cesarean section.   SURGEON:  Tilda Burrow, M.D.   FIRST ASSISTANT:  None.   ANESTHESIA:  General.   COMPLICATIONS:  None.   FINDINGS:  Healthy infant, Apgars 9 and 9, and 9 pounds 7.7 ounces with  significant molding of the vertex, but presenting part still indicating bony  obstruction.   DETAILS OF PROCEDURE:  The patient was taken to the operating room.  The  epidural catheter had quit working due to dislodging of the catheter, with  the patient sweating and movement in bed.  We proceeded with spinal  anesthesia which was effected by Marylene Buerger C.R.N.A. with good results.  Foley catheter was then placed.  Pfannenstiel-type incision was performed  with surprisingly easy entry of the abdominal cavity and identification of  the bladder flap.  This was surprising given the significant obesity.  The  bladder flap was developed, transverse uterine incision made, and extended  laterally using index finger traction.  Fetal vertex could be identified. It  was elevated out of the pelvis, rotated into the incision, and delivered  with fundal pressure and axillary traction and the infant.  The cord was  clamped; and the infant placed in care of the pediatrician in attendance,  Dr. Milford Cage.   Subsequent cord blood samples were obtained; and then  the placenta delivered  intact Tomasa Blase presentation; a 3-vessel cord confirmed.  Uterus was  irrigated with antibiotic solution after removal of the placenta.  No  membrane remnants were encountered.  The uterus required a single-layer,  running, locking closure of the incision, followed by irrigation and  reapproximation of the bladder flap with 2-0 chromic.  Anterior peritoneum  was inspected, laparotomy equipment removed; and then the peritoneum closed  with 2-0 chromic.  The anterior peritoneum was closed with 2-0 chromic, the  fascia closed with #0 Vicryl.  The subcu fatty tissue was irrigated;  cauterized, as  necessary to achieve hemostasis, and then closed with 2-0 plain to  obliterate the potential space; and then staple closure of skin completed  the procedure.  The patient tolerated things well; went to recovery room in  good condition.  Sponge and needle counts correct.  She tolerated labor and  delivery quite well.      Tilda Burrow, M.D.  Electronically Signed     JVF/MEDQ  D:  02/18/2006  T:  02/18/2006  Job:  [id number]   cc:  [facility]   Francoise Schaumann. Milford Cage DO, FAAP  Fax: [phone]

## 2011-04-13 NOTE — H&P (Signed)
Theresa Malone, Theresa Malone                 ACCOUNT NO.:  [id number]   MEDICAL RECORD NO.:  [id number]          PATIENT TYPE:  OIB   LOCATION:  LDR4                          FACILITY:  [facility]   PHYSICIAN:  Tilda Burrow, M.D. DATE OF BIRTH:  [birth year]   DATE OF ADMISSION:  12/02/2005  DATE OF DISCHARGE:  LH                                HISTORY & PHYSICAL   ADMISSION DIAGNOSES:  1.  Pregnancy at 29+ weeks gestation.  Pre-term labor.  2.  History of pre-term delivery at [redacted] weeks gestation.   HISTORY OF PRESENT ILLNESS:  This 30 year old female, gravida 2, para 1, AB  0, LMP May 09, 2005, placing menstrual EDC of February 13, 2006, with  corresponding first and second trimester ultrasounds within one week is  admitted after presenting in the early morning hours of December 02, 2005,  complaining of pelvic pressures like when I delivered my other baby.  Initial evaluation suggested cervix was dilated to 4 cm, but after  reassessment, including transvaginal ultrasound, we found that she has a  floppy external os, but a quite long cervix, which on ultrasound is closed  with cervical length of 5.2 cm.  Her abdominal girth makes external  monitoring assessment challenging, but the patient gives the appearance of  having discomfort on a regular pattern, suggestive of contractions.  She is  admitted and placed on terbutaline dose x1, and then magnesium sulfate.  We  will treat this as a possible pre-term labor, including testing for Group B  Strep, initiation of antibiotic therapy until further results are obtained.  Currently, at 8 a.m., the patient remains stable with no visible  contractions on external monitoring.  Fetal heart rate is in normal range.  The patient is afebrile.   PAST MEDICAL HISTORY:  History of gestational diabetes with prior  pregnancies.  No other ongoing medical illnesses.   PAST SURGICAL HISTORY:  Negative.   ALLERGIES:  No known drug allergies.   SOCIAL HISTORY:  Works  as Clinical biochemist at [employer].   PHYSICAL EXAMINATION:  GENERAL:  She is a healthy-appearing, overweight  African-American female.  Alert and oriented x3.  Complaining of mild to  moderate intermittent abdominal discomfort.  HEENT:  Unremarkable.  ABDOMEN:  She is at 35 cm under light with examination, limited by abdominal  girth.  Height 5' 1.  PELVIC:  Shows a very long vaginal length, the cervix is anterior, soft,  floppy external os, but 5.2 cm in length and completely closed internal os  on transvaginal ultrasound.   LABORATORY DATA:  Chlamydia culture was previously negative in the  pregnancy.  Group B Strep is pending.   PLAN:  Magnesium sulfate for now.  May consider reduction of intervention  once CBC, urinalysis, etc., are back.      Tilda Burrow, M.D.  Electronically Signed     JVF/MEDQ  D:  12/02/2005  T:  12/02/2005  Job:  [id number]   cc:   [facility]

## 2011-04-13 NOTE — Op Note (Signed)
Theresa Malone, Theresa Malone                 ACCOUNT NO.:  [id number]   MEDICAL RECORD NO.:  [id number]          PATIENT TYPE:  INP   LOCATION:  A413                          FACILITY:  [facility]   PHYSICIAN:  Tilda Burrow, M.D. DATE OF BIRTH:  02-21-1981   DATE OF PROCEDURE:  02/13/2006  DATE OF DISCHARGE:                                 OPERATIVE REPORT   PREOPERATIVE DIAGNOSIS:  Pregnancy, 40 weeks. Cephalopelvic disproportion.   POSTOPERATIVE DIAGNOSIS:  Pregnancy, 40 weeks. Cephalopelvic disproportion.   PROCEDURE:  Primary low transverse cervical Cesarean section.   SURGEON:  Tilda Burrow, M.D.   ASSISTANT:  ___N/A_______.   ANESTHESIA:  Spinal, Nelda Severe, CRNA.   COMPLICATIONS:  None.   FINDINGS:  Cephalopelvic disproportion, arrest of labor at 7 cm, completely  effaced, -2 station after a lengthy strong labor. Findings:  9-pound, 7.7-  ounce female infant, Apgars 9 and 9, molded vertex.   DETAILS OF PROCEDURE:  The patient was taken to the operating room, prepped  and draped for lower abdominal surgery with spinal anesthesia introduced on  the first attempt with success. Satisfactory analgesic effect was confirmed.  Fetal heart tones had been documented shortly before spinal. Transverse  lower abdominal incision was performed in standard Pfannenstiel technique,  beneath a rather technically challenging panniculus. The patient had  evidence of hidradenitis suppurativa, but no abscesses were encountered  during the incision itself. Peritoneal cavity was entered, bladder flap  developed and the very thinned out lower uterine segment was opened  transversely and was quite thick. Fetal vertex was rotated into the incision  and expelled by fundal pressure. The shoulders were easily delivered without  any traction on the head. Body was easily further expelled, bulb suctioning  having been performed on the pharynx. Amniotic fluid was without malodor.  Infant was cared for by Dr.  Ara Kussmaul; see his notes for further  details regarding the baby with Apgars of 8 and 9 assigned.   Infant was subsequently weighed 9 pounds 7.7 ounces.   Uterus was irrigated with antibiotic solution after the cord blood samples  had been obtained from the placenta and the placenta expelled. A single  layer of running locking closure of the uterus was performed. The left side  had an arterial bleeder from the collateral artery in that area of the left  uterine artery, and this required three efforts to ligate it. Hemostasis was  excellent at the end of the procedure. Bladder flap was reapproximated with  2-0 chromic. The abdomen had been irrigated as had the bladder flap. The  anterior peritoneum was closed with running 2-0 chromic. Muscles were  loosely pulled together with 0 chromic interrupted x1. The fascia was pulled  together in two layers, sewing the internal and external oblique separately  for the lateral 3 cm on each side. The rest was run as a continuous running  closure. Subcutaneous fatty tissue was mobilized inferiorly enough to allow  for good tissue edge approximation. Interrupted 2-0 plain was used x5  sutures to reduce the potential space and staple closure of the skin  completed the procedure. The patient tolerated the procedure well with 800  cc blood loss estimated. Sponge and needle counts were correct. Condition in  the recovery room good.      Tilda Burrow, M.D.  Electronically Signed     JVF/MEDQ  D:  02/13/2006  T:  02/14/2006  Job:  [id number]   cc:   Francoise Schaumann. Milford Cage DO, FAAP  Fax: [phone]

## 2011-04-13 NOTE — Group Therapy Note (Signed)
Theresa Malone, Theresa Malone                 ACCOUNT NO.:  [id number]   MEDICAL RECORD NO.:  [id number]          PATIENT TYPE:  OIB   LOCATION:  LDR1                          FACILITY:  [facility]   PHYSICIAN:  Lazaro Arms, M.D.   DATE OF BIRTH:  12/10/1980   DATE OF PROCEDURE:  01/15/2006  DATE OF DISCHARGE:  01/15/2006                                   PROGRESS NOTE   HISTORY OF PRESENT ILLNESS:  Theresa Malone is a gravida 2, para 1 with an EDC of  February 13, 2006, putting her at about 34 weeks 5 days gestation who came to  Labor and Delivery this morning at about 7 a.m. with complaints of  contractions since last night, still persisting to this morning.  Her cervix  upon exam was about 1-2 cm, still real thick, -2 station.  Fetal heart rate  has been reactive.  She is having contractions on the monitor irregularly.  We kept her for five hours observation with opportunity to walk around and  also be monitored.  She did complain of questionable spontaneous rupture of  membranes with some clear urine smelling fluids in the bed that was  Nitrazine negative.  A sterile speculum exam was performed, and there was no  pulling of fluid in the vagina.  No fluids seen with coughing or Valsalva.  Nitrazine negative, fern negative.  Her cervix was reexamined, and there was  essentially no change.  She was offered a therapeutic rest which she  accepted, so 14 mg of morphine was given IM in the left thigh, and she was  discharged home with instructions to return to Labor and Delivery for  contractions increasing in intensity.      Jacklyn Shell, C.N.M.      Lazaro Arms, M.D.  Electronically Signed    FC/MEDQ  D:  01/15/2006  T:  01/15/2006  Job:  [id number]

## 2011-04-13 NOTE — Op Note (Signed)
NAMEHARBOR, PASTER                 ACCOUNT NO.:  [id number]   MEDICAL RECORD NO.:  [id number]          PATIENT TYPE:  INP   LOCATION:  A413                          FACILITY:  [facility]   PHYSICIAN:  Tilda Burrow, M.D. DATE OF BIRTH:  Apr 06, 1981   DATE OF PROCEDURE:  02/12/2006  DATE OF DISCHARGE:                                 OPERATIVE REPORT   PROCEDURE:  Epidural catheter placement.   INDICATIONS:  The patient in early labor at 3 cm dilated, requesting  epidural for labor management.   DETAILS OF PROCEDURE:  Continuous lumbar epidural catheter placed using loss-  of-resistance technique. The patient had fluid bolus, and while placed in  sitting position, the back was prepped and draped. Midline site was  identified. It was felt to represent L4-5 interspace. This was significantly  below the waist and the patient's natural waist. Loss-of-resistance  technique was used under local anesthesia to identify the epidural space on  the first attempt, at a depth of almost 9 cm depth. The test dose of 5 cc of  1.5% lidocaine with epinephrine was instilled and the epidural catheter  threaded 3 cm into the epidural space. It would not thread further. The  catheter was taped to the back with several layers of extra long tape and an  extra strip of tape below it in order to optimize potential for the tape to  remain stable, given the patient's very edematous and mobile skin of the  lower back. Catheter was attached to the pump. A 9-cc bolus of 0.125%  Marcaine with 2 mcg/mL of fentanyl administered and then 12 cc per hour  subsequently administered with good analgesic effect. Symmetric analgesia to  T8 was achieved. The patient tolerated the procedure well.      Tilda Burrow, M.D.  Electronically Signed     JVF/MEDQ  D:  02/13/2006  T:  02/14/2006  Job:  [id number]

## 2011-04-13 NOTE — Discharge Summary (Signed)
NAMEZURIEL, YEAMAN                 ACCOUNT NO.:  [id number]   MEDICAL RECORD NO.:  [id number]          PATIENT TYPE:  INP   LOCATION:  9103                          FACILITY:  [facility]   PHYSICIAN:  Adrian Blackwater, MDDATE OF BIRTH:  Apr 14, 1981   DATE OF ADMISSION:  12/06/2004  DATE OF DISCHARGE:  12/12/2004                                 DISCHARGE SUMMARY   ADMISSION DIAGNOSES:  1.  Preterm contractions.  2.  Urinary tract infection.  3.  Gestational diabetes mellitus.  4.  Abdominal pain.   DISCHARGE DIAGNOSES:  1.  Postpartum day number 3.  2.  Spontaneous vaginal delivery at 33.58 weeks with a spontaneous rupture      of membranes.  3.  Pregnancy-induced hypertension and possible endometritis.   BRIEF HISTORY:  This is a 30 year old P1 para 1 0 0 1 who presented at 32-1  weeks to [facility]U, referred from primary physician with complaints of premature  contractions 2-3 days prior to admission and fever of 102.  Received pre-  treatment prior to arrival to [facility] at [facility] with __________ and  terbutaline IM.  Urinalysis from there suggests UTI.  The patient was  started on Keflex and Tylox for pain at that point.  She was seen on Monday,  December 04, 2004, by Dr. Despina Hidden at [facility], having more contractions and  was sent back to [facility] and admitted, receiving treatment for  PTL.  On the day of admission, December 06, 2004, the patient came to [facility]  with fever of 102.8, fetal tachycardia and started treatment with Unasyn,  suspected a chorioamnionitis or UTI.  A PIH pane was negative, hemoglobin  11.5, hepatitis and HIV are negative.  GBS negative.  RPR negative.  GC and  Chlamydia negative.  GTC elevated with diagnosis of gestational diabetes.   LABORATORY DATA:  On admission: White blood cell 8.9, hemoglobin 9.7,  hematocrit 28.9, platelets 244,000.  A standard fetal monitor was  reassuring.   The patient was admitted on the 11th with persistent diagnosis  of preterm  contractions and fever suggesting UTI versus chorioamnionitis and also  gestational diabetes mellitus.  The patient was started on insulin regimen,  Tylox for pain, Unasyn for presumptive UTI.  During hospitalization, the  patient's blood pressure registered in the range of 135-155 systolic and 85-  90 diastolic.  So PIH panel was ordered and came back with liver function  tests within normal limits, BMP within normal limits, uric acid was 7.2.  The patient was placed on magnesium therapy for the [facility] since chief  complaint of abdominal pain, lab work shows increasing urinalysis  creatinine.   Gestational diabetes mellitus.  The patient came with diagnosis of  gestational diabetes mellitus and was placed on insulin during  hospitalization, requiring to halt doses on December 08, 2004, because of  hypoglycemia.  After that, the patient continued stabilizing glucose levels;  all were within normal limits.   Dictation ended at this point.      IM/MEDQ  D:  12/12/2004  T:  12/12/2004  Job:  [id number] 

## 2011-04-13 NOTE — Op Note (Signed)
Theresa Malone, FAIT                 ACCOUNT NO.:  [id number]   MEDICAL RECORD NO.:  [id number]          PATIENT TYPE:  INP   LOCATION:  A413                          FACILITY:  [facility]   PHYSICIAN:  Tilda Burrow, M.D. DATE OF BIRTH:  1981-05-03   DATE OF PROCEDURE:  02/12/2006  DATE OF DISCHARGE:  02/15/2006                                 OPERATIVE REPORT   PROCEDURE:  Epidural catheter placement.   INDICATIONS:  This is a 30 year old female gravida 2, para 1, requesting  epidural for labor management.  She is having prodromal labor symptoms,  tolerated poorly.  Cervix was 3 cm at admission.   DETAILS OF PROCEDURE:  The patient was placed in the sitting position,  flexed forward.  The epidural catheter placement attempt was performed at L4-  5 interspace, well below the waist.  The patient's assessment is compromised  by her significant obesity and mobile edematous posterior skin of her back  and buttocks.   Nonetheless, on the first attempt, we were able to identify the epidural  space at a depth of approximately 8-9 cm when loss-of-resistance technique  gave the sensation of epidural space location.  A 5 mL test dose of 1.5%  lidocaine with epinephrine is infused, followed by placement of the epidural  catheter 3 cm into the epidural space, whereupon, the catheter would not  advance any further.  The Tuohy needle was withdrawn, catheter left in  place; and taped thoroughly to the patient's back in order to minimize  chances of subsequent dislodging, which is increased to the skin mobility  and obesity.  The patient tolerated the procedure well and symmetric  analgesic effect with improved tolerance of labor.      Tilda Burrow, M.D.  Electronically Signed     JVF/MEDQ  D:  02/18/2006  T:  02/18/2006  Job:  [id number]

## 2011-04-13 NOTE — Discharge Summary (Signed)
Theresa Malone                 ACCOUNT NO.:  [id number]   MEDICAL RECORD NO.:  [id number]          PATIENT TYPE:  INP   LOCATION:  A415                          FACILITY:  [facility]   PHYSICIAN:  Lazaro Arms, M.D.   DATE OF BIRTH:  [birth year]   DATE OF ADMISSION:  12/04/2004  DATE OF DISCHARGE:  01/11/2006LH                                 DISCHARGE SUMMARY   TRANSFER FACILITY:  [facility].   ACCEPTING PHYSICIAN:  Dr. Okey Dupre.   TRANSFER DIAGNOSES:  1.  Intrauterine pregnancy at 32-2/[redacted] weeks gestation.  2.  Intraamniotic infection not responding to intravenous Unasyn.  3.  Class A2 diabetes mellitus.  4.  Preterm uterine activity without significant cervical change secondary      to intraamniotic uterine infection.   Please refer to the transcribed history and physical antepartum chart for  details of admission to the hospital.   HOSPITAL COURSE:  Patient was admitted on December 04, 2004 with what appeared  to be an intraamniotic infection.  She had not experienced rupture of  membranes.  She had a positive fetal fibronectin last week and we  prophylactically put her on terbutaline.  She came to the hospital on the  8th for about approximately 12 hours for increased uterine activity but did  not change her cervix.  She was afebrile at that time.  She presented to our  office on December 04, 2004 in the afternoon and stated that just prior to  coming to the office she started having chills.  In the office she had a  temperature of 102.8.  Her lungs were clear.  She had no upper respiratory  symptoms.  She had no CVA tenderness on examination but her uterus was quite  tender and she was having significant uterine irritability.  Her uterus was  basically long, thick, and closed at that time.  As a result, we admitted  her to the hospital, put her on IV Unasyn.  Her white blood count at time of  admission was 13,800.  It subsequently has come down to 10,500 and her left  shift  has improved with 88 segs down to 71% segs.  She has had a negative  urine culture to this point.  Two sets of blood cultures which have also  been negative to this point.  She continues to spike temperature yesterday  afternoon and last night up to 102.8 again with shaking chills and of course  when that happens her uterine activity picks up significantly.  The fetal  status has been stable.  Of course when she becomes significantly febrile  the fetal heart rate goes up to the 170s, 180s, but is nice and reactive  when she is not febrile.  There has been no concern about the fetal status  otherwise.  There has been no decelerations and no persistent fetal  tachycardia when the patient is not febrile.  Her cervix this morning is 1,  thick, and -2.  The baby is vertex.  She has had a couple of ultrasounds in  the last week which reveal  the estimated fetal weight to be 2441 g  consistent with [redacted] weeks gestation and an AFI of 16.2.   The patient continues to not improve despite IV antibiotics.  My impression  is this represents a non-responding intraamniotic infection which unusually  is occurred in a patient who has intact membranes.  As a result of patient's  ongoing status I am concerned about decompensation with sepsis and  decompensating fetal status and as a result of that feel as if the patient  will need to have delivery in the next short period of time.  As a result of  that, the fetus will need neonatal intensive care services and the patient  is transferred for that reason.     Luth   LHE/MEDQ  D:  12/06/2004  T:  12/06/2004  Job:  [id number]

## 2011-04-13 NOTE — Consult Note (Signed)
NAMEKRISTYANNA, Theresa Malone                 ACCOUNT NO.:  [id number]   MEDICAL RECORD NO.:  [id number]          PATIENT TYPE:  INP   LOCATION:  A413                          FACILITY:  [facility]   PHYSICIAN:  Tilda Burrow, M.D. DATE OF BIRTH:  [birth year]   DATE OF CONSULTATION:  02/13/2006  DATE OF DISCHARGE:                                   CONSULTATION   SUMMARYY NOTE:  Midnight (2400 hours) Theresa Malone has progressed slowly through  the night.  The patient has reached 7 cm dilated, completely effaced and -2  station but the vertex is absolutely not descending at all.  The vertex  shows molding present.  External monitor shows optimal contraction pattern  has been achieved.   IMPRESSION:  Cephalopelvic disproportion.   PLAN:  We will proceed with cesarean section at this time.  OR notified.      Tilda Burrow, M.D.  Electronically Signed     JVF/MEDQ  D:  02/13/2006  T:  02/14/2006  Job:  [id number]

## 2011-04-13 NOTE — Discharge Summary (Signed)
NAMEANIELA, Theresa Malone                 ACCOUNT NO.:  [id number]   MEDICAL RECORD NO.:  [id number]          PATIENT TYPE:  INP   LOCATION:  LDR3                          FACILITY:  [facility]   PHYSICIAN:  Lazaro Arms, M.D.   DATE OF BIRTH:  1981/07/19   DATE OF ADMISSION:  01/19/2006  DATE OF DISCHARGE:  02/25/2007LH                                 DISCHARGE SUMMARY   DISCHARGE DIAGNOSES:  1.  Intrauterine pregnancy at 35-1/[redacted] weeks gestation.  2.  Viral gastroenteritis.  3.  Resolved viral gastroenteritis with correction of dehydration.   PROCEDURES:  1.  Admission observation.  2.  Interpretation of NST.   Please refer to the transcribed history and physical and the antepartum  chart for details of admission to the hospital.   HOSPITAL COURSE:  The patient was admitted because of presumed dehydration  from viral gastroenteritis.  When she got to the hospital she did not have  any episodes of vomiting except 1 and no episodes of diarrhea.  Her  laboratory data are all normal.  The external fetal monitor revealed no  uterine activity and a reactive NST on several occasions.  She began feeling  better, was able to take clear liquids and then a regular diet and ate quite  a large amount without difficulty.  As a result she was discharged to home  on the afternoon of February 25.  She was given a prescription for Phenergan  and instructed to keep her followup appointment later this week.      Lazaro Arms, M.D.  Electronically Signed     LHE/MEDQ  D:  01/20/2006  T:  01/21/2006  Job:  [id number]

## 2011-04-13 NOTE — Discharge Summary (Signed)
[name], Theresa Malone                 ACCOUNT NO.:  [id number]   MEDICAL RECORD NO.:  [id number]          PATIENT TYPE:  INP   LOCATION:  A413                          FACILITY:  [facility]   PHYSICIAN:  Tilda Burrow, M.D. DATE OF BIRTH:  Sep 29, 1981   DATE OF ADMISSION:  02/12/2006  DATE OF DISCHARGE:  03/23/2007LH                                 DISCHARGE SUMMARY   ADMITTING DIAGNOSIS:  Pregnancy, term, active labor, 39 weeks 6 days.   DISCHARGE DIAGNOSES:  1.  Pregnancy, 39 weeks 6 days.  2.  Cephalopelvic disproportion.   PROCEDURE:  1.  Pitocin augmentation of labor which was on February 12, 2006.  2.  Primary low transverse cervical cesarean section February 13, 2006.  3.  Also, procedure on February 12, 2006, was lumbar epidural catheter      placement, Jannifer Franklin, M.D.   DISCHARGE MEDICATIONS:  1.  Tylox 1-2 q4-6h. p.r.n. pain.  2.  Motrin 800 mg q.8h. p.r.n. pain.  3.  Prenatal vitamins and iron daily.   FOLLOWUP:  In 5 days, staple removal in our office.   HOSPITAL SUMMARY:  This 30 year old female gravida 2, para 1, at 39 weeks 6  days was admitted for labor management.  She had had prior vaginal delivery  of a pre-term infant 5 pounds 6 ounces one year ago.  This was a baby  delivered at 32 weeks.   HOSPITAL COURSE:  The patient was admitted at 3 cm dilated, 50% effaced, -2  with prodrome of labor symptoms and was admitted early on February 12, 2006.  This patient progressed slowly through the day, had at 1:45 p.m. amniotomy  performed, epidural was in place and effective.  Cervix only made gradual  progress, reaching 4 cm, 50%, -2 by 6 p.m.  At 7 p.m. was 5, 50%, -3 and  then arrested at 7 cm, 100%, -2 by midnight, at which time primary cesarean  section was performed delivering a 9 pound 7.7 ounce female infant with molded  vertex.  Postoperatively, the hemoglobin was 8, hematocrit 26.  The patient  had an uneventful postoperative course, being stable for discharge home on  postop day 2 tolerating regular diet with staples to be removed in 1 week.     Tilda Burrow, M.D.  Electronically Signed    JVF/MEDQ  D:  03/11/2006  T:  03/12/2006  Job:  [id number]

## 2011-08-16 LAB — CBC
HCT: 34 — ABNORMAL LOW
MCHC: 33.4
MCV: 83.9
Platelets: 316

## 2011-08-16 LAB — URINALYSIS, ROUTINE W REFLEX MICROSCOPIC
Bilirubin Urine: NEGATIVE
Glucose, UA: NEGATIVE
Hgb urine dipstick: NEGATIVE
Ketones, ur: NEGATIVE
Protein, ur: NEGATIVE
pH: 6

## 2011-08-16 LAB — POCT CARDIAC MARKERS
Myoglobin, poc: 33.1
Operator id: 198161

## 2011-08-16 LAB — DIFFERENTIAL
Basophils Relative: 1
Eosinophils Absolute: 0.4
Eosinophils Relative: 4
Lymphs Abs: 3
Neutrophils Relative %: 57

## 2011-08-16 LAB — BASIC METABOLIC PANEL
BUN: 9
CO2: 26
Chloride: 101
Creatinine, Ser: 0.65
Glucose, Bld: 103 — ABNORMAL HIGH
Potassium: 3.6

## 2011-09-07 LAB — CBC
HCT: 33.4 — ABNORMAL LOW
MCHC: 33.9
MCV: 84.3
RBC: 3.97
WBC: 8.2

## 2011-09-07 LAB — BASIC METABOLIC PANEL
BUN: 8
CO2: 27
Chloride: 102
Creatinine, Ser: 0.64
GFR calc Af Amer: >60
Potassium: 3.3 — ABNORMAL LOW

## 2011-09-07 LAB — DIFFERENTIAL
Basophils Relative: 0
Eosinophils Absolute: 0.3
Eosinophils Relative: 3
Lymphs Abs: 2.6
Monocytes Relative: 8
Neutrophils Relative %: 56

## 2011-09-07 LAB — URINALYSIS, ROUTINE W REFLEX MICROSCOPIC
Bilirubin Urine: NEGATIVE
Glucose, UA: NEGATIVE
Hgb urine dipstick: NEGATIVE
Ketones, ur: NEGATIVE
Protein, ur: NEGATIVE
Urobilinogen, UA: 0.2

## 2011-09-07 LAB — PREGNANCY, URINE: Preg Test, Ur: NEGATIVE

## 2011-10-12 ENCOUNTER — Emergency Department (HOSPITAL_COMMUNITY)
Admission: EM | Admit: 2011-10-12 | Discharge: 2011-10-12 | Disposition: A | Discharge status: Home / Self Care | Attending: Emergency Medicine | Admitting: Emergency Medicine

## 2011-10-12 DIAGNOSIS — IMO0001 Reserved for inherently not codable concepts without codable children: Secondary | ICD-10-CM | POA: Insufficient documentation

## 2011-10-12 DIAGNOSIS — R059 Cough, unspecified: Secondary | ICD-10-CM | POA: Insufficient documentation

## 2011-10-12 DIAGNOSIS — R509 Fever, unspecified: Secondary | ICD-10-CM | POA: Insufficient documentation

## 2011-10-12 DIAGNOSIS — R51 Headache: Secondary | ICD-10-CM | POA: Insufficient documentation

## 2011-10-12 DIAGNOSIS — J029 Acute pharyngitis, unspecified: Secondary | ICD-10-CM | POA: Insufficient documentation

## 2011-10-12 DIAGNOSIS — R05 Cough: Secondary | ICD-10-CM | POA: Insufficient documentation

## 2011-10-12 DIAGNOSIS — R1110 Vomiting, unspecified: Secondary | ICD-10-CM | POA: Insufficient documentation

## 2011-10-12 DIAGNOSIS — J329 Chronic sinusitis, unspecified: Secondary | ICD-10-CM

## 2011-10-12 MED ORDER — FEXOFENADINE-PSEUDOEPHED ER 60-120 MG PO TB12: 1.0000 | ORAL_TABLET | Freq: Two times a day (BID) | ORAL | Status: AC

## 2011-10-12 MED ORDER — HYDROCODONE-ACETAMINOPHEN 5-325 MG PO TABS: 2.0000 | ORAL_TABLET | ORAL | Status: AC | PRN

## 2011-10-12 MED ORDER — DOXYCYCLINE HYCLATE 100 MG PO CAPS: 100.0000 mg | ORAL_CAPSULE | Freq: Two times a day (BID) | ORAL | Status: AC

## 2011-10-12 NOTE — ED Notes (Signed)
Sore throat for a month.  

## 2011-10-12 NOTE — ED Provider Notes (Signed)
History     CSN: [id number] Arrival date & time: 10/12/2011  9:45 AM   First MD Initiated Contact with Patient 10/12/11 [phone]      Chief Complaint  Patient presents with  . Sore Throat    (Consider location/radiation/quality/duration/timing/severity/associated sxs/prior treatment) Patient is a 30 y.o. female presenting with pharyngitis. The history is provided by the patient.  Sore Throat This is a new problem. The current episode started 1 to 4 weeks ago. The problem occurs daily. The problem has been gradually worsening. Associated symptoms include coughing, a fever, headaches, myalgias, a sore throat and vomiting. Pertinent negatives include no abdominal pain, arthralgias, chest pain or neck pain. The symptoms are aggravated by nothing. She has tried acetaminophen for the symptoms. The treatment provided no relief.    History reviewed. No pertinent past medical history.  History reviewed. No pertinent past surgical history.  History reviewed. No pertinent family history.  History  Substance Use Topics  . Smoking status: Not on file  . Smokeless tobacco: Not on file  . Alcohol Use: No    OB History    Grav Para Term Preterm Abortions TAB SAB Ect Mult Living                  Review of Systems  Constitutional: Positive for fever. Negative for activity change.       All ROS Neg except as noted in HPI  HENT: Positive for sore throat. Negative for nosebleeds and neck pain.   Eyes: Negative for photophobia and discharge.  Respiratory: Positive for cough. Negative for shortness of breath and wheezing.   Cardiovascular: Negative for chest pain and palpitations.  Gastrointestinal: Positive for vomiting. Negative for abdominal pain and blood in stool.  Genitourinary: Negative for dysuria, frequency and hematuria.  Musculoskeletal: Positive for myalgias. Negative for back pain and arthralgias.  Skin: Negative.   Neurological: Positive for headaches. Negative for dizziness,  seizures and speech difficulty.  Psychiatric/Behavioral: Negative for hallucinations and confusion.    Allergies  Review of patient's allergies indicates no known allergies.  Home Medications  No current outpatient prescriptions on file.  BP 125/60  Pulse 99  Temp 97.6 F (36.4 C)  Resp 20  Ht 5\' 1"  (1.549 m)  Wt 270 lb (122.471 kg)  BMI 51.02 kg/m2  SpO2 100%  LMP 10/08/2011  Physical Exam  Nursing note and vitals reviewed. Constitutional: She is oriented to person, place, and time. She appears well-developed and well-nourished.  Non-toxic appearance.  HENT:  Head: Normocephalic.  Right Ear: Tympanic membrane and external ear normal.  Left Ear: Tympanic membrane and external ear normal.       Increase redness of the posterior pharynx. Nasal congestion  Eyes: EOM and lids are normal. Pupils are equal, round, and reactive to light.  Neck: Normal range of motion. Neck supple. Carotid bruit is not present.  Cardiovascular: Normal rate, regular rhythm, normal heart sounds, intact distal pulses and normal pulses.   Pulmonary/Chest: Breath sounds normal. No respiratory distress.  Abdominal: Soft. Bowel sounds are normal. There is no tenderness. There is no guarding.  Musculoskeletal: Normal range of motion.  Lymphadenopathy:       Head (right side): No submandibular adenopathy present.       Head (left side): No submandibular adenopathy present.    She has no cervical adenopathy.  Neurological: She is alert and oriented to person, place, and time. She has normal strength. No cranial nerve deficit or sensory deficit.  Skin: Skin is  warm and dry.  Psychiatric: She has a normal mood and affect. Her speech is normal.    ED Course  Procedures (including critical care time)  Labs Reviewed - No data to display No results found.   No diagnosis found.    MDM  I have reviewed nursing notes, vital signs, and all appropriate lab and imaging results for this  patient.        Kathie Dike, [place] 10/12/11 [phone]

## 2011-10-15 NOTE — ED Provider Notes (Signed)
Medical screening examination/treatment/procedure(s) were performed by non-physician practitioner and as supervising physician I was immediately available for consultation/collaboration.  Harshika Mago, MD 10/15/11 1059 

## 2012-07-02 ENCOUNTER — Encounter (HOSPITAL_COMMUNITY): Payer: Self-pay

## 2012-07-02 ENCOUNTER — Emergency Department (HOSPITAL_COMMUNITY)
Admission: EM | Admit: 2012-07-02 | Discharge: 2012-07-02 | Disposition: A | Discharge status: Home / Self Care | Payer: Medicaid Other | Attending: Emergency Medicine | Admitting: Emergency Medicine

## 2012-07-02 DIAGNOSIS — I10 Essential (primary) hypertension: Secondary | ICD-10-CM | POA: Insufficient documentation

## 2012-07-02 DIAGNOSIS — J029 Acute pharyngitis, unspecified: Secondary | ICD-10-CM

## 2012-07-02 DIAGNOSIS — E119 Type 2 diabetes mellitus without complications: Secondary | ICD-10-CM | POA: Insufficient documentation

## 2012-07-02 HISTORY — DX: Essential (primary) hypertension: I10

## 2012-07-02 MED ORDER — KETOROLAC TROMETHAMINE 60 MG/2ML IM SOLN
60.0000 mg | Freq: Once | INTRAMUSCULAR | Status: AC
  Administered 2012-07-02: 60 mg via INTRAMUSCULAR
  Filled 2012-07-02: qty 2

## 2012-07-02 MED ORDER — HYDROCODONE-ACETAMINOPHEN 7.5-325 MG/15ML PO SOLN: 15.0000 mL | Freq: Three times a day (TID) | ORAL | Status: AC | PRN

## 2012-07-02 NOTE — ED Notes (Signed)
Headache, sore throat, cough, fever since monday

## 2012-07-02 NOTE — ED Provider Notes (Signed)
History     CSN: [id number]  Arrival date & time 07/02/12  0230   First MD Initiated Contact with Patient 07/02/12 [phone]      Chief Complaint  Patient presents with  . Headache  . Fever    (Consider location/radiation/quality/duration/timing/severity/associated sxs/prior treatment) HPI Comments: 31 year old female with a history of sore throat for the last 3 days. This is persistent, gradually worsening, associated with headache, cough and fever. She denies stiff neck, blurred vision, weakness or numbness. Her symptoms are moderate and rated her pain at 8/10. She states that she has a child with similar symptoms.  Patient is a 31 y.o. female presenting with headaches and fever. The history is provided by the patient and a relative.  Headache  Associated symptoms include a fever. Pertinent negatives include no nausea and no vomiting.  Fever Primary symptoms of the febrile illness include fever, headaches and cough. Primary symptoms do not include nausea, vomiting or rash.    Past Medical History  Diagnosis Date  . Hypertension   . Diabetes mellitus     Past Surgical History  Procedure Date  . Cesarean section     No family history on file.  History  Substance Use Topics  . Smoking status: Never Smoker   . Smokeless tobacco: Not on file  . Alcohol Use: No    OB History    Grav Para Term Preterm Abortions TAB SAB Ect Mult Living                  Review of Systems  Constitutional: Positive for fever.  HENT: Positive for sore throat.   Respiratory: Positive for cough.   Gastrointestinal: Negative for nausea and vomiting.  Skin: Negative for rash.  Neurological: Positive for headaches.    Allergies  Review of patient's allergies indicates no known allergies.  Home Medications   Current Outpatient Rx  Name Route Sig Dispense Refill  . LEVONORGESTREL 20 MCG/24HR IU IUD Intrauterine 1 each by Intrauterine route once.      . METFORMIN HCL 500 MG PO TABS Oral  Take 500 mg by mouth 2 (two) times daily with a meal.    . FEXOFENADINE-PSEUDOEPHED ER 60-120 MG PO TB12 Oral Take 1 tablet by mouth every 12 (twelve) hours. 20 tablet 0  . HYDROCODONE-ACETAMINOPHEN 7.5-325 MG/15ML PO SOLN Oral Take 15 mLs by mouth every 8 (eight) hours as needed for pain. 120 mL 0  . IBUPROFEN 200 MG PO TABS Oral Take 800 mg by mouth every 6 (six) hours as needed. Pain      BP 139/75  Pulse 124  Temp 100.6 F (38.1 C) (Oral)  Resp 18  Ht 5\' 1"  (1.549 m)  Wt 272 lb (123.378 kg)  BMI 51.39 kg/m2  SpO2 98%  LMP 06/30/2012  Physical Exam  Nursing note and vitals reviewed. Constitutional: She appears well-developed and well-nourished. No distress.  HENT:  Head: Normocephalic and atraumatic.  Mouth/Throat: No oropharyngeal exudate.       Oropharynx is erythematous with exudative spots on both tonsils, no asymmetry, no hypertrophy, mucous membranes moist, tympanic membranes normal bilaterally, nostrils clear bilaterally  Eyes: Conjunctivae and EOM are normal. Pupils are equal, round, and reactive to light. Right eye exhibits no discharge. Left eye exhibits no discharge. No scleral icterus.  Neck: Normal range of motion. Neck supple. No JVD present. No thyromegaly present.  Cardiovascular: Regular rhythm, normal heart sounds and intact distal pulses.  Exam reveals no gallop and no friction rub.  No murmur heard.      Pulse of 105 on my exam  Pulmonary/Chest: Effort normal and breath sounds normal. No respiratory distress. She has no wheezes. She has no rales.  Lymphadenopathy:    She has no cervical adenopathy.  Neurological: She is alert. Coordination normal.       Speech is clear, patient is oriented to place and situation, moves all extremities without difficulty.  Skin: Skin is warm and dry. No rash noted. No erythema.  Psychiatric: She has a normal mood and affect. Her behavior is normal.    ED Course  Procedures (including critical care time)   Labs Reviewed   RAPID STREP SCREEN   No results found.   1. Pharyngitis       MDM  Rule out strep pharyngitis by swab, intramuscular ketorolac given, patient benign in appearance overall with sore throat and fever likely pharyngitis. She has a normal mental status and no meningismus   Patient well appearing, strep is negative, home with hydrocodone suspension.   Discharge Prescriptions include:  Hydrocodone apap suspension  Vida Roller, MD 07/02/12 [phone]

## 2012-10-24 ENCOUNTER — Encounter (HOSPITAL_COMMUNITY): Payer: Self-pay | Admitting: Emergency Medicine

## 2012-10-24 ENCOUNTER — Emergency Department (HOSPITAL_COMMUNITY): Payer: Worker's Compensation

## 2012-10-24 ENCOUNTER — Emergency Department (HOSPITAL_COMMUNITY)
Admission: EM | Admit: 2012-10-24 | Discharge: 2012-10-24 | Disposition: A | Discharge status: Home / Self Care | Payer: Worker's Compensation | Attending: Emergency Medicine | Admitting: Emergency Medicine

## 2012-10-24 DIAGNOSIS — Y9210 Unspecified residential institution as the place of occurrence of the external cause: Secondary | ICD-10-CM | POA: Insufficient documentation

## 2012-10-24 DIAGNOSIS — I10 Essential (primary) hypertension: Secondary | ICD-10-CM | POA: Insufficient documentation

## 2012-10-24 DIAGNOSIS — R209 Unspecified disturbances of skin sensation: Secondary | ICD-10-CM | POA: Insufficient documentation

## 2012-10-24 DIAGNOSIS — S335XXA Sprain of ligaments of lumbar spine, initial encounter: Secondary | ICD-10-CM | POA: Insufficient documentation

## 2012-10-24 DIAGNOSIS — Y93F9 Activity, other caregiving: Secondary | ICD-10-CM | POA: Insufficient documentation

## 2012-10-24 DIAGNOSIS — X503XXA Overexertion from repetitive movements, initial encounter: Secondary | ICD-10-CM | POA: Insufficient documentation

## 2012-10-24 DIAGNOSIS — M5430 Sciatica, unspecified side: Secondary | ICD-10-CM | POA: Insufficient documentation

## 2012-10-24 DIAGNOSIS — Z79899 Other long term (current) drug therapy: Secondary | ICD-10-CM | POA: Insufficient documentation

## 2012-10-24 DIAGNOSIS — S39012A Strain of muscle, fascia and tendon of lower back, initial encounter: Secondary | ICD-10-CM

## 2012-10-24 DIAGNOSIS — E119 Type 2 diabetes mellitus without complications: Secondary | ICD-10-CM | POA: Insufficient documentation

## 2012-10-24 DIAGNOSIS — X500XXA Overexertion from strenuous movement or load, initial encounter: Secondary | ICD-10-CM | POA: Insufficient documentation

## 2012-10-24 DIAGNOSIS — M5431 Sciatica, right side: Secondary | ICD-10-CM

## 2012-10-24 MED ORDER — CYCLOBENZAPRINE HCL 10 MG PO TABS: ORAL_TABLET | ORAL | Status: DC

## 2012-10-24 MED ORDER — CYCLOBENZAPRINE HCL 10 MG PO TABS
10.0000 mg | ORAL_TABLET | Freq: Once | ORAL | Status: AC
  Administered 2012-10-24: 10 mg via ORAL
  Filled 2012-10-24: qty 1

## 2012-10-24 MED ORDER — PREDNISONE 50 MG PO TABS: 50.0000 mg | ORAL_TABLET | Freq: Every day | ORAL | Status: DC

## 2012-10-24 MED ORDER — PREDNISONE 50 MG PO TABS
60.0000 mg | ORAL_TABLET | Freq: Once | ORAL | Status: AC
  Administered 2012-10-24: 60 mg via ORAL
  Filled 2012-10-24: qty 1

## 2012-10-24 NOTE — ED Notes (Signed)
Pt transferring pt last night at work, [employer]. C/o lower back pain with most pain on right. Nad.

## 2012-10-24 NOTE — ED Provider Notes (Signed)
History     CSN: [id number]  Arrival date & time 10/24/12  1120   First MD Initiated Contact with Patient 10/24/12 1137      Chief Complaint  Patient presents with  . 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  She was trying to move a resident yest who was not being cooperative.  The resident pulled and nearly fell.  The pt injured her R lower back and has some "numbness" in the R leg.  States she also is having some numbness in the R arm.  She denies any arm or neck injury.  Has an MD in [place].  Patient is a 31 y.o. female presenting with back pain. The history is provided by the patient. No language interpreter was used.  Back Pain  This is a new problem. The current episode started yesterday. The problem occurs constantly. The problem has not changed since onset.The pain is the same all the time. Associated symptoms include numbness, leg pain and paresthesias. Pertinent negatives include no fever, no bowel incontinence, no perianal numbness, no bladder incontinence, no dysuria, no pelvic pain, no paresis, no tingling and no weakness. She has tried NSAIDs for the symptoms. The treatment provided no relief. Risk factors include obesity.    Past Medical History  Diagnosis Date  . Hypertension   . Diabetes mellitus     Past Surgical History  Procedure Date  . Cesarean section     History reviewed. No pertinent family history.  History  Substance Use Topics  . Smoking status: Never Smoker   . Smokeless tobacco: Not on file  . Alcohol Use: No    OB History    Grav Para Term Preterm Abortions TAB SAB Ect Mult Living                  Review of Systems  Constitutional: Negative for fever.  Gastrointestinal: Negative for bowel incontinence.  Genitourinary: Negative for bladder incontinence, dysuria and pelvic pain.  Musculoskeletal: Positive for  back pain.  Neurological: Positive for numbness and paresthesias. Negative for tingling and weakness.  All other systems reviewed and are negative.    Allergies  Review of patient's allergies indicates no known allergies.  Home Medications   Current Outpatient Rx  Name  Route  Sig  Dispense  Refill  . DOXYCYCLINE HYCLATE 100 MG PO TABS   Oral   Take 100 mg by mouth 2 (two) times daily.         Marland Kitchen HYDROCODONE-ACETAMINOPHEN 5-325 MG PO TABS   Oral   Take 1 tablet by mouth every 6 (six) hours as needed. Pain         . LEVONORGESTREL 20 MCG/24HR IU IUD   Intrauterine   1 each by Intrauterine route once.           . METFORMIN HCL 500 MG PO TABS   Oral   Take 500 mg by mouth 2 (two) times daily with a meal.         . CYCLOBENZAPRINE HCL 10 MG PO TABS      1/2 to one tab po TID   20 tablet   0   . PREDNISONE 50 MG PO TABS   Oral   Take 1 tablet (50 mg total) by mouth daily.   6 tablet   0     BP 136/88  Pulse 88  Temp 97.9 F (36.6 C) (Oral)  Resp 18  SpO2 100%  LMP 09/23/2012  Physical Exam  Nursing note and vitals reviewed. Constitutional: She is oriented to person, place, and time. She appears well-developed and well-nourished. No distress.  HENT:  Head: Normocephalic and atraumatic.  Eyes: EOM are normal.  Neck: Normal range of motion.  Cardiovascular: Normal rate and regular rhythm.   Pulmonary/Chest: Effort normal.  Abdominal: Soft. She exhibits no distension. There is no tenderness.  Musculoskeletal: She exhibits tenderness.       Lumbar back: She exhibits decreased range of motion, tenderness, bony tenderness and pain. She exhibits no swelling, no edema, no deformity, no laceration, no spasm and normal pulse.       Back:  Neurological: She is alert and oriented to person, place, and time.  Skin: Skin is warm and dry.  Psychiatric: She has a normal mood and affect. Judgment normal.    ED Course  Procedures (including critical care  time)  Labs Reviewed  GLUCOSE, CAPILLARY - Abnormal; Notable for the following:    Glucose-Capillary 157 (*)     All other components within normal limits   Dg Lumbar Spine Complete  10/24/2012  *RADIOLOGY REPORT*  Clinical Data: Back pain.  LUMBAR SPINE - COMPLETE 4+ VIEW  Comparison: None.  Findings: There are five lumbar-type vertebral bodies.  No fracture or malalignment.  Disc spaces well maintained.  SI joints are symmetric.  IUD noted in the uterus.  IMPRESSION: No acute bony abnormality.   Original Report Authenticated By: Charlett Nose, M.D.      1. Lumbar strain   2. Right sided sciatica       MDM  rx-flexeril, 20 rx-prednisone 50 mg, 6 Ice F/u with dr. Hilda Lias prn         Evalina Field, PA 10/24/12 1635

## 2012-10-25 NOTE — ED Provider Notes (Signed)
Medical screening examination/treatment/procedure(s) were performed by non-physician practitioner and as supervising physician I was immediately available for consultation/collaboration.   Arlenis Blaydes, MD 10/25/12 0819 

## 2013-03-07 ENCOUNTER — Emergency Department (HOSPITAL_COMMUNITY)
Admission: EM | Admit: 2013-03-07 | Discharge: 2013-03-07 | Disposition: A | Discharge status: Home / Self Care | Payer: Medicaid Other | Attending: Emergency Medicine | Admitting: Emergency Medicine

## 2013-03-07 ENCOUNTER — Encounter (HOSPITAL_COMMUNITY): Payer: Self-pay | Admitting: *Deleted

## 2013-03-07 DIAGNOSIS — J029 Acute pharyngitis, unspecified: Secondary | ICD-10-CM

## 2013-03-07 DIAGNOSIS — J209 Acute bronchitis, unspecified: Secondary | ICD-10-CM | POA: Insufficient documentation

## 2013-03-07 DIAGNOSIS — J3489 Other specified disorders of nose and nasal sinuses: Secondary | ICD-10-CM | POA: Insufficient documentation

## 2013-03-07 DIAGNOSIS — R059 Cough, unspecified: Secondary | ICD-10-CM | POA: Insufficient documentation

## 2013-03-07 DIAGNOSIS — J069 Acute upper respiratory infection, unspecified: Secondary | ICD-10-CM

## 2013-03-07 DIAGNOSIS — R51 Headache: Secondary | ICD-10-CM | POA: Insufficient documentation

## 2013-03-07 DIAGNOSIS — Z79899 Other long term (current) drug therapy: Secondary | ICD-10-CM | POA: Insufficient documentation

## 2013-03-07 DIAGNOSIS — J40 Bronchitis, not specified as acute or chronic: Secondary | ICD-10-CM

## 2013-03-07 DIAGNOSIS — E119 Type 2 diabetes mellitus without complications: Secondary | ICD-10-CM | POA: Insufficient documentation

## 2013-03-07 DIAGNOSIS — R1310 Dysphagia, unspecified: Secondary | ICD-10-CM | POA: Insufficient documentation

## 2013-03-07 DIAGNOSIS — R599 Enlarged lymph nodes, unspecified: Secondary | ICD-10-CM | POA: Insufficient documentation

## 2013-03-07 DIAGNOSIS — R05 Cough: Secondary | ICD-10-CM | POA: Insufficient documentation

## 2013-03-07 DIAGNOSIS — I10 Essential (primary) hypertension: Secondary | ICD-10-CM | POA: Insufficient documentation

## 2013-03-07 DIAGNOSIS — R21 Rash and other nonspecific skin eruption: Secondary | ICD-10-CM | POA: Insufficient documentation

## 2013-03-07 LAB — RAPID STREP SCREEN (MED CTR MEBANE ONLY): Streptococcus, Group A Screen (Direct): NEGATIVE

## 2013-03-07 MED ORDER — AZITHROMYCIN 250 MG PO TABS: 250.0000 mg | ORAL_TABLET | Freq: Every day | ORAL | Status: DC

## 2013-03-07 MED ORDER — NAPROXEN 500 MG PO TABS: 500.0000 mg | ORAL_TABLET | Freq: Two times a day (BID) | ORAL | Status: DC

## 2013-03-07 MED ORDER — HYDROCODONE-ACETAMINOPHEN 5-325 MG PO TABS: 1.0000 | ORAL_TABLET | Freq: Four times a day (QID) | ORAL | Status: DC | PRN

## 2013-03-07 NOTE — ED Notes (Signed)
Pt states she has had a cold for awhile and now it is painful to swallow and the pain extends to her left ear at times.

## 2013-03-07 NOTE — ED Notes (Signed)
Pt c/o sore throat which makes it hard for her to eat and has an earache.

## 2013-03-07 NOTE — ED Provider Notes (Signed)
History  This chart was scribed for Shelda Jakes, MD by Erskine Emery, ED Scribe. This patient was seen in room APA15/APA15 and the patient's care was started at 21:58.   CSN: [id number]  Arrival date & time 03/07/13  2135   First MD Initiated Contact with Patient 03/07/13 2158      Chief Complaint  Patient presents with  . Sore Throat  . Otalgia    (Consider location/radiation/quality/duration/timing/severity/associated sxs/prior treatment) The history is provided by the patient. No language interpreter was used.  Theresa Malone is a 32 y.o. female who presents to the Emergency Department complaining of gradually worsening sore throat with associated otalgia upon trying to swallow for the past week. Pt reports some associated productive cough (chunky phlegm, sometimes with blood), rhinorrhea, rash (on chest), and intermittent headache (not currently). Pt denies any associated fevers, nausea, emesis, diarrhea, visual changes, chest pain, SOB, abdominal pain, dysuria, swelling in the legs, neck pain, or new back pain. She has no h/o strep throat.   [facility] is the pt's PCP.  Past Medical History  Diagnosis Date  . Hypertension   . Diabetes mellitus     Past Surgical History  Procedure Laterality Date  . Cesarean section      History reviewed. No pertinent family history.  History  Substance Use Topics  . Smoking status: Never Smoker   . Smokeless tobacco: Not on file  . Alcohol Use: No    OB History   Grav Para Term Preterm Abortions TAB SAB Ect Mult Living                  Review of Systems  Constitutional: Negative for fever and diaphoresis.  HENT: Positive for ear pain, sore throat, rhinorrhea and trouble swallowing.   Eyes: Negative for visual disturbance.  Respiratory: Positive for cough. Negative for shortness of breath.   Cardiovascular: Negative for chest pain.  Gastrointestinal: Negative for nausea, vomiting, abdominal pain and diarrhea.   Genitourinary: Negative for dysuria.  Musculoskeletal: Negative for back pain.  Skin: Positive for rash.  Neurological: Positive for headaches.  Hematological: Does not bruise/bleed easily.  Psychiatric/Behavioral: Negative for confusion.  All other systems reviewed and are negative.    Allergies  Review of patient's allergies indicates no known allergies.  Home Medications   Current Outpatient Rx  Name  Route  Sig  Dispense  Refill  . HYDROcodone-acetaminophen (NORCO/VICODIN) 5-325 MG per tablet   Oral   Take 1 tablet by mouth every 6 (six) hours as needed. Pain         . metFORMIN (GLUCOPHAGE) 500 MG tablet   Oral   Take 500 mg by mouth 2 (two) times daily with a meal.         . azithromycin (ZITHROMAX) 250 MG tablet   Oral   Take 1 tablet (250 mg total) by mouth daily. Take first 2 tablets together, then 1 every day until finished.   6 tablet   0   . HYDROcodone-acetaminophen (NORCO/VICODIN) 5-325 MG per tablet   Oral   Take 1-2 tablets by mouth every 6 (six) hours as needed for pain.   10 tablet   0   . levonorgestrel (MIRENA) 20 MCG/24HR IUD   Intrauterine   1 each by Intrauterine route once.           . naproxen (NAPROSYN) 500 MG tablet   Oral   Take 1 tablet (500 mg total) by mouth 2 (two) times daily.  14 tablet   0     Triage Vitals: BP 147/98  Pulse 119  Temp(Src) 98.4 F (36.9 C) (Oral)  Resp 20  Ht 5\' 1"  (1.549 m)  Wt 260 lb (117.935 kg)  BMI 49.15 kg/m2  SpO2 100%  Physical Exam  Nursing note and vitals reviewed. Constitutional: She is oriented to person, place, and time. She appears well-developed and well-nourished. No distress.  HENT:  Head: Normocephalic and atraumatic.  Right Ear: External ear normal.  Left Ear: External ear normal.  Mouth/Throat: Oropharynx is clear and moist.  Tonsils are not enlarged or red.  Eyes: EOM are normal. Pupils are equal, round, and reactive to light. No scleral icterus.  Neck: Neck supple.  No tracheal deviation present.  Cervical adenopathy on both sides, more on the right.  Cardiovascular: Normal rate, regular rhythm and normal heart sounds.   No murmur heard. Pulmonary/Chest: Effort normal and breath sounds normal. No respiratory distress. She has no wheezes.  Abdominal: Soft. Bowel sounds are normal. She exhibits no distension. There is no tenderness.  Musculoskeletal: Normal range of motion. She exhibits no edema and no tenderness.  Lymphadenopathy:    She has cervical adenopathy.  Neurological: She is alert and oriented to person, place, and time. No cranial nerve deficit. Coordination normal.  Skin: Skin is warm and dry.  Psychiatric: She has a normal mood and affect.    ED Course  Procedures (including critical care time) DIAGNOSTIC STUDIES: Oxygen Saturation is 100% on room air, normal by my interpretation.    COORDINATION OF CARE: 22:05-I evaluated the patient and we discussed a treatment plan including strep test and antibiotics to which the pt agreed.  22:36--I rechecked the pt and notified her of the negative results of her strep test. I explained that we would put her on an antibiotic.   Results for orders placed during the hospital encounter of 03/07/13  RAPID STREP SCREEN      Result Value Range   Streptococcus, Group A Screen (Direct) NEGATIVE  NEGATIVE      1. Upper respiratory infection   2. Pharyngitis   3. Bronchitis       MDM  Patient is nontoxic no acute distress. Presents with sore throat upper respiratory infection and productive cough suggestive of a bronchitis. Strep throat test is negative symptoms most likely viral however will give a trial of a Z-Pak for the bronchitis and possible pneumonia. Patient also be treated with Naprosyn and hydrocodone as needed for more severe pain. She'll followup with her primary care Dr. in 2 days if not better.      I personally performed the services described in this documentation, which was  scribed in my presence. The recorded information has been reviewed and is accurate.     Shelda Jakes, MD 03/07/13 2245

## 2013-11-02 ENCOUNTER — Telehealth: Payer: Self-pay | Admitting: *Deleted

## 2013-11-02 NOTE — Telephone Encounter (Signed)
Pt informed that we would be unable to give her a letter for something that occurred in 2006, per Dr. Despina Hidden. Pt verbalized understanding.

## 2014-03-03 ENCOUNTER — Encounter: Payer: Self-pay | Admitting: Women's Health

## 2014-03-03 ENCOUNTER — Ambulatory Visit (INDEPENDENT_AMBULATORY_CARE_PROVIDER_SITE_OTHER): Payer: Medicaid Other | Admitting: Women's Health

## 2014-03-03 VITALS — BP 150/98 | Ht 61.0 in | Wt 259.0 lb

## 2014-03-03 DIAGNOSIS — Z30432 Encounter for removal of intrauterine contraceptive device: Secondary | ICD-10-CM

## 2014-03-03 DIAGNOSIS — O24919 Unspecified diabetes mellitus in pregnancy, unspecified trimester: Secondary | ICD-10-CM | POA: Insufficient documentation

## 2014-03-03 DIAGNOSIS — O10019 Pre-existing essential hypertension complicating pregnancy, unspecified trimester: Secondary | ICD-10-CM | POA: Insufficient documentation

## 2014-03-03 DIAGNOSIS — E119 Type 2 diabetes mellitus without complications: Secondary | ICD-10-CM

## 2014-03-03 DIAGNOSIS — I10 Essential (primary) hypertension: Secondary | ICD-10-CM

## 2014-03-03 DIAGNOSIS — Z6841 Body Mass Index (BMI) 40.0 and over, adult: Secondary | ICD-10-CM

## 2014-03-03 MED ORDER — PRENATAL PLUS 27-1 MG PO TABS: 1.0000 | ORAL_TABLET | Freq: Every day | ORAL | Status: DC

## 2014-03-03 NOTE — Patient Instructions (Addendum)
Labetalol 218m BID (twice a day) for blood pressure and Glyburide 586mBID for diabetes  Check blood sugars 4 times a day: in the morning before eating/drinking anything (<90), 2 hours after eating breakfast, lunch, and supper (<120)   Preparing for Pregnancy Preparing for pregnancy (preconceptual care) by getting counseling and information from your caregiver before getting pregnant is a good idea. It will help you and your baby have a better chance to have a healthy, safe pregnancy and delivery of your baby. Make an appointment with your caregiver to talk about your health, medical, and family history and how to prepare yourself before getting pregnant. Your caregiver will do a complete physical exam and a Pap test. They will want to know:  About you, your spouse or partner, and your family's medical and genetic history.  If you are eating a balanced diet and drinking enough fluids.  What vitamins and mineral supplements you are taking. This includes taking folic acid before getting pregnant to help prevent birth defects.  What medications you are taking including prescription, over-the-counter and herbal medications.  If there is any substance abuse like alcohol, smoking, and illegal drugs.  If there is any mental or physical domestic violence.  If there is any risk of sexually transmitted disease between you and your partner.  What immunizations and vaccinations you have had and what you may need before getting pregnant.  If you should get tested for HIV infection.  If there is any exposure to chemical or toxic substances at home or work.  If there are medical problems you have that need to be treated and kept under control before getting pregnant such as diabetes, high blood pressure or others.  If there were any past surgeries, pregnancies and problems with them.  What your current weight is and to set a goal as to how much weight you should gain while pregnant. Also, they will  check if you should lose or gain weight before getting pregnant.  What is your exercise routine and what it is safe when you are pregnant.  If there are any physical disabilities that need to be addressed.  About spacing your pregnancies when there are other children.  If there is a financial problem that may affect you having a child. After talking about the above points with your caregiver, your caregiver will give you advice on how to help treat and work with you on solving any issues, if necessary, before getting pregnant. The goal is to have a healthy and safe pregnancy for you and your baby. You should keep an accurate record of your menstrual periods because it will help in determining your due date. Immunizations that you should have before getting pregnant:   Regular measles, GeKoreaeasles (rubella) and mumps.  Tetanus and diphtheria.  Chickenpox, if not immune.  Herpes zoster (Varicella) if not immune.  Human papilloma virus vaccine (HPV) between the age of 9 50nd 265ears old.  Hepatitis A vaccine.  Hepatitis B vaccine.  Influenza vaccine.  Pneumococcal vaccine (pneumonia). You should avoid getting pregnant for one month after getting vaccinated with a live virus vaccine such as GeKoreaeasles (rubella) which is in the MMR (Measles, Mumps and Rubella) vaccine. Other immunizations may be necessary depending on where you live, such as malaria. Ask your caregiver if any other immunizations are needed for you. HOME CARE INSTRUCTIONS   Follow the advice of your caregiver.  Before getting pregnant:  Begin taking vitamins, supplements, and 0.4 milligrams folic  acid daily.  Get your immunizations up-to-date.  Get help from a nutrition counselor if you do not understand what a balanced diet is, need help with a special medical diet or if you need help to lose or gain weight.  Begin exercising.  Stop smoking, taking illegal drugs, and drinking alcoholic beverages.  Get  counseling if there is and type of domestic violence.  Get checked for sexually transmitted diseases including HIV.  Get any medical problems under control (diabetes, high blood pressure, convulsions, asthma or others).  Resolve any financial concerns or create a plan to do so.  Be sure you and your spouse or partner are ready to have a baby.  Keep an accurate record of your menstrual periods. Document Released: 10/25/2008 Document Revised: 09/02/2013 Document Reviewed: 10/25/2008 [facility] Patient Information 2014 [place].

## 2014-03-03 NOTE — Progress Notes (Signed)
Patient ID: Theresa Malone, female   DOB: [birth year], 33 y.o.   MRN: [id number] Theresa FriarKelly W Conard is a 33 y.o. year old [id number] African American female who presents for removal of a Mirena IUD, she would like to get pregnant. Her Mirena IUD was placed 2011.   PC[facility], has f/u appt w/ her next month  H/O Type2 DM since 33yo w/o evidence of end-organ damage, states last eye exam was normal, denies peripheral neuropathy. Was on Metformin, gave her terrible diarrhea, so she was just switched to Januvia 100mg  in January. States her cbg's are running in the 200-300s, checks them randomly.   H/O CHTN x 2338yrs, on HCTZ 25mg  daily x 2332yr  Patient's last menstrual period was 02/19/2014. BP 150/98  Ht 5\' 1"  (1.549 m)  Wt 259 lb (117.482 kg)  BMI 48.96 kg/m2  LMP 02/19/2014  A Graves speculum was placed in the vagina.  The cervix was visualized and the strings were visible. They were grasped and the Mirena was easily removed.   A: Mirena removal w/ intent to get pregnant     Morbid obesity     CHTN on HCTZ     Type2DM on Januvia     Preconception counseling  P: Would like to switch her to Labetalol and Glyburide in anticipation of pregnancy- she is to call her   PCP to make sure this is OK w/ her, and call us back so we can rx      Recommended postponing pregnancy until [facility] and DM under control      To begin checking CBGs QID: fasting (goal of <90) & 2hr pp breakfast, lunch, and supper (goal of   <120)      Reviewed ideal diet: lean meats, lots of vegetables. Drastically decrease carbs, water to drink      Advised exercise 3-5x/wk x 30mins, can start w/ walking      Begin pnv, rx prenatal plus w/ 11RF      F/U 1 mth for pap & physical      Keep appt w/ PCP next month to manage CHTN/DM until she is pregnant       Marge DuncansKimberly Randall Booker CNM, [facility]NP-BC 03/03/2014 2:26 PM

## 2014-03-04 ENCOUNTER — Telehealth: Payer: Self-pay | Admitting: Women's Health

## 2014-03-04 NOTE — Telephone Encounter (Signed)
Pt states discussed medication for her BS and hypertension with PCP as discussed with Joellyn HaffKim Booker, CNM and was given the ok. Pt states Selena BattenKim can e-scribed to [facility] in [place]. Pt informed Selena BattenKim not in office today will route message to her.

## 2014-03-08 MED ORDER — GLYBURIDE 5 MG PO TABS: 5.0000 mg | ORAL_TABLET | Freq: Two times a day (BID) | ORAL | Status: DC

## 2014-03-08 MED ORDER — LABETALOL HCL 200 MG PO TABS: 200.0000 mg | ORAL_TABLET | Freq: Two times a day (BID) | ORAL | Status: DC

## 2014-03-08 NOTE — Telephone Encounter (Signed)
Pt informed of Joellyn HaffKim Booker, CNM recommendations, pt verbalized understanding. Call transferred to front staff for an appt for f/u of effectiveness of medications in 1-2 weeks.

## 2014-03-22 ENCOUNTER — Encounter: Payer: Self-pay | Admitting: Women's Health

## 2014-03-22 ENCOUNTER — Ambulatory Visit (INDEPENDENT_AMBULATORY_CARE_PROVIDER_SITE_OTHER): Payer: Medicaid Other | Admitting: Women's Health

## 2014-03-22 VITALS — BP 150/70 | Ht 61.0 in | Wt 271.0 lb

## 2014-03-22 DIAGNOSIS — E119 Type 2 diabetes mellitus without complications: Secondary | ICD-10-CM

## 2014-03-22 DIAGNOSIS — I10 Essential (primary) hypertension: Secondary | ICD-10-CM

## 2014-03-22 NOTE — Patient Instructions (Signed)
Increase glyburide to 10mg  twice daily (2 pills in the morning, 2 pills at night of your 5mg  prescription until it runs out) Decrease carbohydrates: breads, pasta, rice, potatoes, corn, candy, cake, cookies, sweets, sodas Increase activity Increase water intake  Diabetes Meal Planning Guide The diabetes meal planning guide is a tool to help you plan your meals and snacks. It is important for people with diabetes to manage their blood glucose (sugar) levels. Choosing the right foods and the right amounts throughout your day will help control your blood glucose. Eating right can even help you improve your blood pressure and reach or maintain a healthy weight. CARBOHYDRATE COUNTING MADE EASY When you eat carbohydrates, they turn to sugar. This raises your blood glucose level. Counting carbohydrates can help you control this level so you feel better. When you plan your meals by counting carbohydrates, you can have more flexibility in what you eat and balance your medicine with your food intake. Carbohydrate counting simply means adding up the total amount of carbohydrate grams in your meals and snacks. Try to eat about the same amount at each meal. Foods with carbohydrates are listed below. Each portion below is 1 carbohydrate serving or 15 grams of carbohydrates. Ask your dietician how many grams of carbohydrates you should eat at each meal or snack. Grains and Starches  1 slice bread.   English muffin or hotdog/hamburger bun.   cup cold cereal (unsweetened).   cup cooked pasta or rice.   cup starchy vegetables (corn, potatoes, peas, beans, winter squash).  1 tortilla (6 inches).   bagel.  1 waffle or pancake (size of a CD).   cup cooked cereal.  4 to 6 small crackers. *Whole grain is recommended. Fruit  1 cup fresh unsweetened berries, melon, papaya, pineapple.  1 small fresh fruit.   banana or mango.   cup fruit juice (4 oz unsweetened).   cup canned fruit in natural  juice or water.  2 tbs dried fruit.  12 to 15 grapes or cherries. Milk and Yogurt  1 cup fat-free or 1% milk.  1 cup soy milk.  6 oz light yogurt with sugar-free sweetener.  6 oz low-fat soy yogurt.  6 oz plain yogurt. Vegetables  1 cup raw or  cup cooked is counted as 0 carbohydrates or a "free" food.  If you eat 3 or more servings at 1 meal, count them as 1 carbohydrate serving. Other Carbohydrates   oz chips or pretzels.   cup ice cream or frozen yogurt.   cup sherbet or sorbet.  2 inch square cake, no frosting.  1 tbs honey, sugar, jam, jelly, or syrup.  2 small cookies.  3 squares of graham crackers.  3 cups popcorn.  6 crackers.  1 cup broth-based soup.  Count 1 cup casserole or other mixed foods as 2 carbohydrate servings.  Foods with less than 20 calories in a serving may be counted as 0 carbohydrates or a "free" food. You may want to purchase a book or computer software that lists the carbohydrate gram counts of different foods. In addition, the nutrition facts panel on the labels of the foods you eat are a good source of this information. The label will tell you how big the serving size is and the total number of carbohydrate grams you will be eating per serving. Divide this number by 15 to obtain the number of carbohydrate servings in a portion. Remember, 1 carbohydrate serving equals 15 grams of carbohydrate. SERVING SIZES Measuring foods  and serving sizes helps you make sure you are getting the right amount of food. The list below tells how big or small some common serving sizes are.  1 oz.........4 stacked dice.  3 oz........Marland Kitchen.Deck of cards.  1 tsp.......Marland Kitchen.Tip of little finger.  1 tbs......Marland Kitchen.Marland Kitchen.Thumb.  2 tbs.......Marland Kitchen.Golf ball.   cup......Marland Kitchen.Half of a fist.  1 cup.......Marland Kitchen.A fist. SAMPLE DIABETES MEAL PLAN Below is a sample meal plan that includes foods from the grain and starches, dairy, vegetable, fruit, and meat groups. A dietician can  individualize a meal plan to fit your calorie needs and tell you the number of servings needed from each food group. However, controlling the total amount of carbohydrates in your meal or snack is more important than making sure you include all of the food groups at every meal. You may interchange carbohydrate containing foods (dairy, starches, and fruits). The meal plan below is an example of a 2000 calorie diet using carbohydrate counting. This meal plan has 17 carbohydrate servings. Breakfast  1 cup oatmeal (2 carb servings).   cup light yogurt (1 carb serving).  1 cup blueberries (1 carb serving).   cup almonds. Snack  1 large apple (2 carb servings).  1 low-fat string cheese stick. Lunch  Chicken breast salad.  1 cup spinach.   cup chopped tomatoes.  2 oz chicken breast, sliced.  2 tbs low-fat [place] dressing.  12 whole-wheat crackers (2 carb servings).  12 to 15 grapes (1 carb serving).  1 cup low-fat milk (1 carb serving). Snack  1 cup carrots.   cup hummus (1 carb serving). Dinner  3 oz broiled salmon.  1 cup brown rice (3 carb servings). Snack  1  cups steamed broccoli (1 carb serving) drizzled with 1 tsp olive oil and lemon juice.  1 cup light pudding (2 carb servings). DIABETES MEAL PLANNING WORKSHEET Your dietician can use this worksheet to help you decide how many servings of foods and what types of foods are right for you.  BREAKFAST Food Group and Servings / Carb Servings Grain/Starches __________________________________ Dairy __________________________________________ Vegetable ______________________________________ Fruit ___________________________________________ Meat __________________________________________ Fat ____________________________________________ LUNCH Food Group and Servings / Carb Servings Grain/Starches ___________________________________ Dairy ___________________________________________ Fruit  ____________________________________________ Meat ___________________________________________ Fat _____________________________________________ Laural GoldenINNER Food Group and Servings / Carb Servings Grain/Starches ___________________________________ Dairy ___________________________________________ Fruit ____________________________________________ Meat ___________________________________________ Fat _____________________________________________ SNACKS Food Group and Servings / Carb Servings Grain/Starches ___________________________________ Dairy ___________________________________________ Vegetable _______________________________________ Fruit ____________________________________________ Meat ___________________________________________ Fat _____________________________________________ DAILY TOTALS Starches _________________________ Vegetable ________________________ Fruit ____________________________ Dairy ____________________________ Meat ____________________________ Fat ______________________________ Document Released: 08/09/2005 Document Revised: 02/04/2012 Document Reviewed: 06/20/2009 [employer] Patient Information 2014 [employer].

## 2014-03-22 NOTE — Progress Notes (Signed)
Patient ID: Theresa BoutonKelly R Pinckney, female   DOB: 1981-11-16, 33 y.o.   MRN: [id number]   [facility]  Patient name: Theresa Malone MRN [id number]  Date of birth: 1981-11-16  CC & HPI:  Theresa BoutonKelly R Lacson is a 33 y.o. African American female presenting today for f/u after switching from hctz 25mg  daily to labetalol 200mg  bid and januvia 100mg  daily to glyburide 5mg  bid on 4/13 in anticipation of pregnancy after having Mirena IUD removed. FBS 95, 269, 251, 370. 2hr PP: 126-345 mostly in 200s. Hasn't been checking bp at home.  Has gained 11lbs since 03/03/14.  Still eating lots of carbs, has 1-2 sprites daily. States she hasn't had recent A1C or other labs.   Pertinent History Reviewed:  Medical & Surgical Hx:   Past Medical History  Diagnosis Date  . Hypertension   . Diabetes mellitus    Past Surgical History  Procedure Laterality Date  . Cesarean section     Medications: Reviewed & Updated - see associated section Social History: Reviewed -  reports that she has never smoked. She has never used smokeless tobacco.  Objective Findings:  Vitals: BP 150/70  Ht 5\' 1"  (1.549 m)  Wt 271 lb (122.925 kg)  BMI 51.23 kg/m2  LMP 03/17/2014  Physical Examination: General appearance - alert, well appearing, and in no distress  No results found for this or any previous visit (from the past 24 hour(s)).   Assessment & Plan:  A:   Type II DM uncontrolled  CHTN  Morbid obesity, BMI 51.23  Preconception counseling P:  Increase glyburide to 10mg  BID  Continue Labetalol 200mg  BID, take bp at home BID  Continue pnv daily   Drastically decrease/cut out carbs, including sodas- offered dietician referral- states she has been multiple times and knows what she is supposed to do  Increase activity  F/U this week for fasting labs: CBC, CMP, TSH, A1C, Lipid panel  F/U 5/11 as scheduled for pap & physical  Marge DuncansKimberly Randall Delayna Sparlin CNM, [facility]NP-BC 03/22/2014 3:45 PM

## 2014-03-26 ENCOUNTER — Other Ambulatory Visit: Payer: Medicaid Other

## 2014-03-29 ENCOUNTER — Other Ambulatory Visit: Payer: Medicaid Other

## 2014-04-05 ENCOUNTER — Other Ambulatory Visit: Payer: Medicaid Other | Admitting: Women's Health

## 2014-04-20 ENCOUNTER — Encounter (HOSPITAL_COMMUNITY): Payer: Self-pay | Admitting: Emergency Medicine

## 2014-04-20 ENCOUNTER — Emergency Department (INDEPENDENT_AMBULATORY_CARE_PROVIDER_SITE_OTHER)
Admission: EM | Admit: 2014-04-20 | Discharge: 2014-04-20 | Disposition: A | Discharge status: Home / Self Care | Payer: Self-pay | Source: Home / Self Care

## 2014-04-20 DIAGNOSIS — M778 Other enthesopathies, not elsewhere classified: Secondary | ICD-10-CM

## 2014-04-20 DIAGNOSIS — X503XXA Overexertion from repetitive movements, initial encounter: Secondary | ICD-10-CM

## 2014-04-20 DIAGNOSIS — M79601 Pain in right arm: Secondary | ICD-10-CM

## 2014-04-20 DIAGNOSIS — T148XXA Other injury of unspecified body region, initial encounter: Secondary | ICD-10-CM

## 2014-04-20 DIAGNOSIS — M779 Enthesopathy, unspecified: Principal | ICD-10-CM

## 2014-04-20 DIAGNOSIS — M79602 Pain in left arm: Secondary | ICD-10-CM

## 2014-04-20 DIAGNOSIS — M65839 Other synovitis and tenosynovitis, unspecified forearm: Secondary | ICD-10-CM

## 2014-04-20 DIAGNOSIS — M79609 Pain in unspecified limb: Secondary | ICD-10-CM

## 2014-04-20 DIAGNOSIS — R209 Unspecified disturbances of skin sensation: Secondary | ICD-10-CM

## 2014-04-20 DIAGNOSIS — X58XXXA Exposure to other specified factors, initial encounter: Secondary | ICD-10-CM

## 2014-04-20 DIAGNOSIS — R202 Paresthesia of skin: Secondary | ICD-10-CM

## 2014-04-20 DIAGNOSIS — M65849 Other synovitis and tenosynovitis, unspecified hand: Secondary | ICD-10-CM

## 2014-04-20 NOTE — ED Provider Notes (Signed)
Medical screening examination/treatment/procedure(s) were performed by resident physician or non-physician practitioner and as supervising physician I was immediately available for consultation/collaboration.   Pam Vanalstine DOUGLAS MD.   Sekou Zuckerman D Kiyoko Mcguirt, MD 04/20/14 1644 

## 2014-04-20 NOTE — Discharge Instructions (Signed)
Paresthesia Paresthesia is an abnormal burning or prickling sensation. This sensation is generally felt in the hands, arms, legs, or feet. However, it may occur in any part of the body. It is usually not painful. The feeling may be described as:  Tingling or numbness.  "Pins and needles."  Skin crawling.  Buzzing.  Limbs "falling asleep."  Itching. Most people experience temporary (transient) paresthesia at some time in their lives. CAUSES  Paresthesia may occur when you breathe too quickly (hyperventilation). It can also occur without any apparent cause. Commonly, paresthesia occurs when pressure is placed on a nerve. The feeling quickly goes away once the pressure is removed. For some people, however, paresthesia is a long-lasting (chronic) condition caused by an underlying disorder. The underlying disorder may be:  A traumatic, direct injury to nerves. Examples include a:  Broken (fractured) neck.  Fractured skull.  A disorder affecting the brain and spinal cord (central nervous system). Examples include:  Transverse myelitis.  Encephalitis.  Transient ischemic attack.  Multiple sclerosis.  Stroke.  Tumor or blood vessel problems, such as an arteriovenous malformation pressing against the brain or spinal cord.  A condition that damages the peripheral nerves (peripheral neuropathy). Peripheral nerves are not part of the brain and spinal cord. These conditions include:  Diabetes.  Peripheral vascular disease.  Nerve entrapment syndromes, such as carpal tunnel syndrome.  Shingles.  Hypothyroidism.  Vitamin B12 deficiencies.  Alcoholism.  Heavy metal poisoning (lead, arsenic).  Rheumatoid arthritis.  Systemic lupus erythematosus. DIAGNOSIS  Your caregiver will attempt to find the underlying cause of your paresthesia. Your caregiver may:  Take your medical history.  Perform a physical exam.  Order various lab tests.  Order imaging tests. TREATMENT    Treatment for paresthesia depends on the underlying cause. HOME CARE INSTRUCTIONS  Avoid drinking alcohol.  You may consider massage or acupuncture to help relieve your symptoms.  Keep all follow-up appointments as directed by your caregiver. SEEK IMMEDIATE MEDICAL CARE IF:   You feel weak.  You have trouble walking or moving.  You have problems with speech or vision.  You feel confused.  You cannot control your bladder or bowel movements.  You feel numbness after an injury.  You faint.  Your burning or prickling feeling gets worse when walking.  You have pain, cramps, or dizziness.  You develop a rash. MAKE SURE YOU:  Understand these instructions.  Will watch your condition.  Will get help right away if you are not doing well or get worse. Document Released: 11/02/2002 Document Revised: 02/04/2012 Document Reviewed: 08/03/2011 [facility] Patient Information 2014 [employer] [place].  Repetitive Strain Injuries Repetitive strain injuries (RSIs) result from overuse or misuse of soft tissues including muscles, tendons, or nerves. Tendons are the cord-like structures that attach muscles to bones. RSIs can affect almost any part of the body. However, RSIs are most common in the arms (thumbs, wrists, elbows, shoulders) and legs (ankles, knees). Common medical conditions that are often caused by repetitive strain include carpal tunnel syndrome, tennis or golfer's elbow, bursitis, and tendonitis. If RSIs are treated early, and therepeated activity is reduced or removed, the severity and length of your problems can usually be reduced. RSIs are also called cumulative trauma disorders (CTD).  CAUSES  Many RSIs occur due to repeating the same activity at work over weeks or months without sufficient rest, such as prolonged typing. RSIs also commonly occur when a hobby or sport is done repeatedly without sufficient rest. RSIs can also occur due to  repeated strain or stress on a body part  in someone who has one or more risk factors for RSIs. RISK FACTORS Workplace risk factors  Frequent computer use, especially if your workstation is not adjusted for your body type.  Infrequent rest breaks.  Working in a high-pressure environment.  Working at a [employer].  Repeating the same motion, such as frequent typing.  Working in an awkward position or holding the same position for a long time.  Forceful movements such as lifting, pulling, or pushing.  Vibration caused by using power tools.  Working in cold temperatures.  Job stress. Personal risk factors  Poor posture.  Being loose-jointed.  Not exercising regularly.  Being overweight.  Arthritis, diabetes, thyroid problems, or other long-term (chronic)medical conditions.  Vitamin deficiencies.  Keeping your fingernails long.  An unhealthy, stressful, or inactive lifestyle.  Not sleeping well. SYMPTOMS  Symptoms often begin at work but become more noticeable after the repeated stress has ended. For example, you may develop fatigue or soreness in your wrist while typingat work, and at night you may develop numbness and tingling in your fingers. Common symptoms include:   Burning, shooting, or aching pain, especially in the fingers, palms, wrists, forearms, or shoulders.  Tenderness.  Swelling.  Tingling, numbness, or loss of feeling.  Pain with certain activities, such as turning a doorknob or reaching above your head.  Weakness, heaviness, or loss of coordination in yourhand.  Muscle spasms or tightness. In some cases, symptoms can become so intense that it is difficult to perform everyday tasks. Symptoms that do not improve with rest may indicate a more serious condition.  DIAGNOSIS  Your caregiver may determine the type ofRSI you have based on your medical evaluation and a description of your activities.  TREATMENT  Treatment depends on the severity and type of RSI you have. Your caregiver  may recommend rest for the affected body part, medicines, and physical or occupational therapy to reduce pain, swelling, and soreness. Discuss the activities you do repeatedly with your caregiver. Your caregiver can help you decide whether you need to change your activities. An RSI may take months or years to heal, especially if the affected body part gets insufficient rest. In some cases, such as severe carpal tunnel syndrome, surgery may be recommended. PREVENTION  Talk with your supervisor to make sure you have the proper equipment for your work station.  Maintain good posture at your desk or work station with:  Feet flat on the floor.  Knees directly over the feet, bent at a right angle.  Lower back supported by your chair or a cushion in the curve of your lower back.  Shoulders and arms relaxed and at your sides.  Neck relaxed and not bent forwards or backwards.  Your desk and computer workstation properly adjusted to your body type.  Your chair adjusted so there is no excess pressure on the back of your thighs.  The keyboard resting above your thighs. You should be able to reach the keys with your elbows at your side, bent at a right angle. Your arms should be supported on forearm rests, with your forearms parallel to the ground.  The computer mouse within easy reach.  The monitor directly in front of you, so that your eyes are aligned with the top of the screen. The screen should be about 15 to 25 inches from your eyes.  While typing, keep your wrist straight, in a neutral position. Move your entire arm when you move  your mouse or when typing hard-to-reach keys.  Only use your computer as much as you need to for work. Do not use it during breaks.  Take breaks often from any repeated activity. Alternate with another task which requires you to use different muscles, or rest at least once every hour.  Change positions regularly. If you spend a lot of time sitting, get up, walk  around, and stretch.  Do not hold pens or pencils tightly when writing.  Exercise regularly.  Maintain a normal weight.  Eat a diet with plenty of vegetables, whole grains, and fruit.  Get sufficient, restful sleep. HOME CARE INSTRUCTIONS  If your caregiver prescribed medicine to help reduce swelling, take it as directed.  Only take over-the-counter or prescription medicines for pain, discomfort, or fever as directed by your caregiver.  Reduce, and if needed, stopthe activities that are causing your problems until you have no further symptoms.If your symptoms are work-related, you may need to talk to your supervisor about changing your activities.  When symptoms develop, put ice or a cold pack on the aching area.  Put ice in a plastic bag.  Place a towel between your skin and the bag.  Leave the ice on for 15-20 minutes.  If you were given a splint to keep your wrist from bending, wear it as instructed. It is important to wear the splint at night. Use the splint for as long as your caregiver recommends. SEEK MEDICAL CARE IF:  You develop new problems.  Your problems do not get better with medicine. MAKE SURE YOU:  Understand these instructions.  Will watch your condition.  Will get help right away if you are not doing well or get worse. Document Released: 11/02/2002 Document Revised: 05/13/2012 Document Reviewed: 01/03/2012 [facility] Patient Information 2014 [place], [place].  Tendinitis Tendinitis is swelling and inflammation of the tendons. Tendons are band-like tissues that connect muscle to bone. Tendinitis commonly occurs in the:   Shoulders (rotator cuff).  Heels (Achilles tendon).  Elbows (triceps tendon). CAUSES Tendinitis is usually caused by overusing the tendon, muscles, and joints involved. When the tissue surrounding a tendon (synovium) becomes inflamed, it is called tenosynovitis. Tendinitis commonly develops in people whose jobs require repetitive  motions. SYMPTOMS  Pain.  Tenderness.  Mild swelling. DIAGNOSIS Tendinitis is usually diagnosed by physical exam. Your caregiver may also order X-rays or other imaging tests. TREATMENT Your caregiver may recommend certain medicines or exercises for your treatment. HOME CARE INSTRUCTIONS   Use a sling or splint for as long as directed by your caregiver until the pain decreases.  Put ice on the injured area.  Put ice in a plastic bag.  Place a towel between your skin and the bag.  Leave the ice on for 15-20 minutes, 03-04 times a day.  Avoid using the limb while the tendon is painful. Perform gentle range of motion exercises only as directed by your caregiver. Stop exercises if pain or discomfort increase, unless directed otherwise by your caregiver.  Only take over-the-counter or prescription medicines for pain, discomfort, or fever as directed by your caregiver. SEEK MEDICAL CARE IF:   Your pain and swelling increase.  You develop new, unexplained symptoms, especially increased numbness in the hands. MAKE SURE YOU:   Understand these instructions.  Will watch your condition.  Will get help right away if you are not doing well or get worse. Document Released: 11/09/2000 Document Revised: 02/04/2012 Document Reviewed: 01/29/2011 [facility] Patient Information 2014 [place], [place].

## 2014-04-20 NOTE — ED Notes (Signed)
Numbness in both forearms and hands.  Intermittently has sharp pain that shoots.  Right arm worse than left.  Onset of symptoms started one week ago.  Reports a headache last night.

## 2014-04-20 NOTE — ED Provider Notes (Signed)
CSN: [id number]     Arrival date & time 04/20/14  1217 History   First MD Initiated Contact with Patient 04/20/14 1446     Chief Complaint  Patient presents with  . 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 No trauma or known injury. Gradual onset. Sx's intermittent and numbness mostly at night after work.    Past Medical History  Diagnosis Date  . Hypertension   . Diabetes mellitus    Past Surgical History  Procedure Laterality Date  . Cesarean section     Family History  Problem Relation Age of Onset  . Diabetes Paternal Grandfather   . Cancer Paternal Grandmother     liver  . Hypertension Father   . Diabetes Mother   . Hypertension Mother   . Diabetes Maternal Uncle    History  Substance Use Topics  . Smoking status: Never Smoker   . Smokeless tobacco: Never Used  . Alcohol Use: Yes     Comment: occ   OB History   Grav Para Term Preterm Abortions TAB SAB Ect Mult Living   3 3 2 1      3      Review of Systems  Constitutional: Positive for activity change. Negative for fever, chills and fatigue.  HENT: Negative.   Respiratory: Negative.   Musculoskeletal: Negative for arthralgias, back pain, joint swelling and neck pain.       As per HPI  Skin: Negative for color change, pallor and rash.  Neurological: Positive for numbness.    Allergies  Lisinopril  Home Medications   Prior to Admission medications   Medication Sig Start Date End Date Taking? Authorizing Provider  glyBURIDE (DIABETA) 5 MG tablet Take 1 tablet (5 mg total) by mouth 2 (two) times daily with a meal. 03/08/14   Marge Duncans, CNM  hydrochlorothiazide (HYDRODIURIL) 25 MG tablet Take 25 mg by mouth daily.    Historical Provider, MD  labetalol (NORMODYNE) 200 MG tablet Take 1 tablet (200 mg total) by mouth 2  (two) times daily. 03/08/14   Marge Duncans, CNM  prenatal vitamin w/FE, FA (PRENATAL 1 + 1) 27-1 MG TABS tablet Take 1 tablet by mouth daily at 12 noon. 03/03/14   Marge Duncans, CNM   BP 139/87  Pulse 94  Temp(Src) 98.3 F (36.8 C) (Oral)  Resp 16  SpO2 100%  LMP 03/17/2014 Physical Exam  Nursing note and vitals reviewed. Constitutional: She is oriented to person, place, and time. She appears well-developed and well-nourished. No distress.  HENT:  Head: Normocephalic and atraumatic.  Eyes: EOM are normal.  Neck: Normal range of motion. Neck supple.  Pulmonary/Chest: Effort normal.  Musculoskeletal:  Tenderness to bilat forearm extensor muscles, R lateral epicondyle and wrists.  + bilat Finklesteins', Pos extensor tendon pain of forearms with wrist extension.  Strength 4/5 bilat. Distal vascular, sensory, motor intact. Brisk cap refill. Radial pulses 2+.  Neurological: She is alert and oriented to person, place, and time. No cranial nerve deficit.  Skin: Skin is warm and dry.    ED Course  Procedures (including critical care time) Labs Review Labs Reviewed - No data to display  Imaging Review No results found.   MDM   1. Tendinitis of forearm   2. Tendinitis of thumb   3. Paresthesia and pain of both upper extremities  4. Overuse injury     Wrist /forearm splint during work, may switch sides prn Ice before, during and especially after work Modify extremity movement to lessen use of extensor muscles, as demo'd.    Hayden Rasmussenavid Cortlynn Hollinsworth, NP 04/20/14 [phone]

## 2014-06-21 ENCOUNTER — Telehealth: Payer: Self-pay | Admitting: Women's Health

## 2014-06-21 MED ORDER — PRENATAL PLUS 27-1 MG PO TABS: 1.0000 | ORAL_TABLET | Freq: Every day | ORAL | Status: DC

## 2014-06-21 NOTE — Telephone Encounter (Signed)
Pt states is now pregnant and wanted to make sure she should be taking the medication that was Rx'd by Selena BattenKim form DM and HTN.  I advised pt to remain on current medications until her new ob appointment.  Pt also states that she recently switched pharmacies and needs a refill on her PNV, she now uses [facility] [address]

## 2014-07-05 ENCOUNTER — Other Ambulatory Visit: Payer: Self-pay | Admitting: Obstetrics and Gynecology

## 2014-07-05 DIAGNOSIS — O3680X0 Pregnancy with inconclusive fetal viability, not applicable or unspecified: Secondary | ICD-10-CM

## 2014-07-06 ENCOUNTER — Other Ambulatory Visit: Payer: Self-pay | Admitting: Obstetrics and Gynecology

## 2014-07-06 ENCOUNTER — Ambulatory Visit (INDEPENDENT_AMBULATORY_CARE_PROVIDER_SITE_OTHER): Payer: Medicaid Other

## 2014-07-06 DIAGNOSIS — O09299 Supervision of pregnancy with other poor reproductive or obstetric history, unspecified trimester: Secondary | ICD-10-CM

## 2014-07-06 DIAGNOSIS — O34219 Maternal care for unspecified type scar from previous cesarean delivery: Secondary | ICD-10-CM

## 2014-07-06 DIAGNOSIS — O3680X0 Pregnancy with inconclusive fetal viability, not applicable or unspecified: Secondary | ICD-10-CM

## 2014-07-06 NOTE — Progress Notes (Signed)
U/S(7+5wks)-single IUP with +FCA noted, FHR-143 bpm, CRL c/w LMP dates, cx appears closed, bilateral adnexa appears WNL

## 2014-07-14 ENCOUNTER — Ambulatory Visit (INDEPENDENT_AMBULATORY_CARE_PROVIDER_SITE_OTHER): Payer: Medicaid Other | Admitting: Women's Health

## 2014-07-14 ENCOUNTER — Other Ambulatory Visit (HOSPITAL_COMMUNITY)
Admission: RE | Admit: 2014-07-14 | Discharge: 2014-07-14 | Disposition: A | Discharge status: Home / Self Care | Payer: Medicaid Other | Source: Ambulatory Visit | Attending: Obstetrics & Gynecology | Admitting: Obstetrics & Gynecology

## 2014-07-14 ENCOUNTER — Encounter: Payer: Self-pay | Admitting: Women's Health

## 2014-07-14 VITALS — BP 126/78 | Wt 255.0 lb

## 2014-07-14 DIAGNOSIS — Z1389 Encounter for screening for other disorder: Secondary | ICD-10-CM

## 2014-07-14 DIAGNOSIS — O09891 Supervision of other high risk pregnancies, first trimester: Secondary | ICD-10-CM

## 2014-07-14 DIAGNOSIS — Z1371 Encounter for nonprocreative screening for genetic disease carrier status: Secondary | ICD-10-CM

## 2014-07-14 DIAGNOSIS — O09211 Supervision of pregnancy with history of pre-term labor, first trimester: Secondary | ICD-10-CM

## 2014-07-14 DIAGNOSIS — Z331 Pregnant state, incidental: Secondary | ICD-10-CM

## 2014-07-14 DIAGNOSIS — Z1159 Encounter for screening for other viral diseases: Secondary | ICD-10-CM

## 2014-07-14 DIAGNOSIS — O24919 Unspecified diabetes mellitus in pregnancy, unspecified trimester: Secondary | ICD-10-CM

## 2014-07-14 DIAGNOSIS — Z01419 Encounter for gynecological examination (general) (routine) without abnormal findings: Secondary | ICD-10-CM | POA: Insufficient documentation

## 2014-07-14 DIAGNOSIS — Z1151 Encounter for screening for human papillomavirus (HPV): Secondary | ICD-10-CM | POA: Diagnosis present

## 2014-07-14 DIAGNOSIS — Z3481 Encounter for supervision of other normal pregnancy, first trimester: Secondary | ICD-10-CM

## 2014-07-14 DIAGNOSIS — Z113 Encounter for screening for infections with a predominantly sexual mode of transmission: Secondary | ICD-10-CM | POA: Insufficient documentation

## 2014-07-14 DIAGNOSIS — O34219 Maternal care for unspecified type scar from previous cesarean delivery: Secondary | ICD-10-CM | POA: Insufficient documentation

## 2014-07-14 DIAGNOSIS — Z0189 Encounter for other specified special examinations: Secondary | ICD-10-CM

## 2014-07-14 DIAGNOSIS — O10019 Pre-existing essential hypertension complicating pregnancy, unspecified trimester: Secondary | ICD-10-CM

## 2014-07-14 DIAGNOSIS — Z0184 Encounter for antibody response examination: Secondary | ICD-10-CM

## 2014-07-14 DIAGNOSIS — O09899 Supervision of other high risk pregnancies, unspecified trimester: Secondary | ICD-10-CM | POA: Insufficient documentation

## 2014-07-14 DIAGNOSIS — O09219 Supervision of pregnancy with history of pre-term labor, unspecified trimester: Secondary | ICD-10-CM

## 2014-07-14 DIAGNOSIS — Z130 Encounter for screening for diseases of the blood and blood-forming organs and certain disorders involving the immune mechanism: Secondary | ICD-10-CM

## 2014-07-14 LAB — POCT URINALYSIS DIPSTICK
Blood, UA: NEGATIVE
Glucose, UA: NEGATIVE
KETONES UA: NEGATIVE
Leukocytes, UA: NEGATIVE
Nitrite, UA: NEGATIVE
PROTEIN UA: NEGATIVE

## 2014-07-14 LAB — CBC
HEMATOCRIT: 30.4 % — AB (ref 36.0–46.0)
HEMOGLOBIN: 10.1 g/dL — AB (ref 12.0–15.0)
MCH: 26.2 pg (ref 26.0–34.0)
MCHC: 33.2 g/dL (ref 30.0–36.0)
MCV: 79 fL (ref 78.0–100.0)
Platelets: 288 10*3/uL (ref 150–400)
RBC: 3.85 MIL/uL — ABNORMAL LOW (ref 3.87–5.11)
RDW: 17.1 % — ABNORMAL HIGH (ref 11.5–15.5)
WBC: 8.8 10*3/uL (ref 4.0–10.5)

## 2014-07-14 LAB — HEMOGLOBIN A1C
Hgb A1c MFr Bld: 7.9 % — ABNORMAL HIGH (ref <5.7)
MEAN PLASMA GLUCOSE: 180 mg/dL — AB (ref <117)

## 2014-07-14 NOTE — Patient Instructions (Addendum)
Begin taking an 81mg  baby aspirin daily starting at 12 weeks (08/05/14)  Get strips for your meter, and check blood sugars 4 times a day: in the morning before eating/drinking anything (<90) and 2 hours after eating breakfast, lunch, and supper (<120).   Call eye doctor and schedule an appointment to have your eyes checked    Nausea & Vomiting  Have saltine crackers or pretzels by your bed and eat a few bites before you raise your head out of bed in the morning  Eat small frequent meals throughout the day instead of large meals  Drink plenty of fluids throughout the day to stay hydrated, just don't drink a lot of fluids with your meals.  This can make your stomach fill up faster making you feel sick  Do not brush your teeth right after you eat  Products with real ginger are good for nausea, like ginger ale and ginger hard candy Make sure it says made with real ginger!  Sucking on sour candy like lemon heads is also good for nausea  If your prenatal vitamins make you nauseated, take them at night so you will sleep through the nausea  Sea Bands  If you feel like you need medicine for the nausea & vomiting please let us know  If you are unable to keep any fluids or food down please let us know   First Trimester of Pregnancy The first trimester of pregnancy is from week 1 until the end of week 12 (months 1 through 3). A week after a sperm fertilizes an egg, the egg will implant on the wall of the uterus. This embryo will begin to develop into a baby. Genes from you and your partner are forming the baby. The female genes determine whether the baby is a boy or a girl. At 6-8 weeks, the eyes and face are formed, and the heartbeat can be seen on ultrasound. At the end of 12 weeks, all the baby's organs are formed.  Now that you are pregnant, you will want to do everything you can to have a healthy baby. Two of the most important things are to get good prenatal care and to follow your health care  provider's instructions. Prenatal care is all the medical care you receive before the baby's birth. This care will help prevent, find, and treat any problems during the pregnancy and childbirth. BODY CHANGES Your body goes through many changes during pregnancy. The changes vary from woman to woman.   You may gain or lose a couple of pounds at first.  You may feel sick to your stomach (nauseous) and throw up (vomit). If the vomiting is uncontrollable, call your health care provider.  You may tire easily.  You may develop headaches that can be relieved by medicines approved by your health care provider.  You may urinate more often. Painful urination may mean you have a bladder infection.  You may develop heartburn as a result of your pregnancy.  You may develop constipation because certain hormones are causing the muscles that push waste through your intestines to slow down.  You may develop hemorrhoids or swollen, bulging veins (varicose veins).  Your breasts may begin to grow larger and become tender. Your nipples may stick out more, and the tissue that surrounds them (areola) may become darker.  Your gums may bleed and may be sensitive to brushing and flossing.  Dark spots or blotches (chloasma, mask of pregnancy) may develop on your face. This will likely fade after  the baby is born.  Your menstrual periods will stop.  You may have a loss of appetite.  You may develop cravings for certain kinds of food.  You may have changes in your emotions from day to day, such as being excited to be pregnant or being concerned that something may go wrong with the pregnancy and baby.  You may have more vivid and strange dreams.  You may have changes in your hair. These can include thickening of your hair, rapid growth, and changes in texture. Some women also have hair loss during or after pregnancy, or hair that feels dry or thin. Your hair will most likely return to normal after your baby is  born. WHAT TO EXPECT AT YOUR PRENATAL VISITS During a routine prenatal visit:  You will be weighed to make sure you and the baby are growing normally.  Your blood pressure will be taken.  Your abdomen will be measured to track your baby's growth.  The fetal heartbeat will be listened to starting around week 10 or 12 of your pregnancy.  Test results from any previous visits will be discussed. Your health care provider may ask you:  How you are feeling.  If you are feeling the baby move.  If you have had any abnormal symptoms, such as leaking fluid, bleeding, severe headaches, or abdominal cramping.  If you have any questions. Other tests that may be performed during your first trimester include:  Blood tests to find your blood type and to check for the presence of any previous infections. They will also be used to check for low iron levels (anemia) and Rh antibodies. Later in the pregnancy, blood tests for diabetes will be done along with other tests if problems develop.  Urine tests to check for infections, diabetes, or protein in the urine.  An ultrasound to confirm the proper growth and development of the baby.  An amniocentesis to check for possible genetic problems.  Fetal screens for spina bifida and Down syndrome.  You may need other tests to make sure you and the baby are doing well. HOME CARE INSTRUCTIONS  Medicines  Follow your health care provider's instructions regarding medicine use. Specific medicines may be either safe or unsafe to take during pregnancy.  Take your prenatal vitamins as directed.  If you develop constipation, try taking a stool softener if your health care provider approves. Diet  Eat regular, well-balanced meals. Choose a variety of foods, such as meat or vegetable-based protein, fish, milk and low-fat dairy products, vegetables, fruits, and whole grain breads and cereals. Your health care provider will help you determine the amount of  weight gain that is right for you.  Avoid raw meat and uncooked cheese. These carry germs that can cause birth defects in the baby.  Eating four or five small meals rather than three large meals a day may help relieve nausea and vomiting. If you start to feel nauseous, eating a few soda crackers can be helpful. Drinking liquids between meals instead of during meals also seems to help nausea and vomiting.  If you develop constipation, eat more high-fiber foods, such as fresh vegetables or fruit and whole grains. Drink enough fluids to keep your urine clear or pale yellow. Activity and Exercise  Exercise only as directed by your health care provider. Exercising will help you:  Control your weight.  Stay in shape.  Be prepared for labor and delivery.  Experiencing pain or cramping in the lower abdomen or low back is  a good sign that you should stop exercising. Check with your health care provider before continuing normal exercises.  Try to avoid standing for long periods of time. Move your legs often if you must stand in one place for a long time.  Avoid heavy lifting.  Wear low-heeled shoes, and practice good posture.  You may continue to have sex unless your health care provider directs you otherwise. Relief of Pain or Discomfort  Wear a good support bra for breast tenderness.   Take warm sitz baths to soothe any pain or discomfort caused by hemorrhoids. Use hemorrhoid cream if your health care provider approves.   Rest with your legs elevated if you have leg cramps or low back pain.  If you develop varicose veins in your legs, wear support hose. Elevate your feet for 15 minutes, 3-4 times a day. Limit salt in your diet. Prenatal Care  Schedule your prenatal visits by the twelfth week of pregnancy. They are usually scheduled monthly at first, then more often in the last 2 months before delivery.  Write down your questions. Take them to your prenatal visits.  Keep all your  prenatal visits as directed by your health care provider. Safety  Wear your seat belt at all times when driving.  Make a list of emergency phone numbers, including numbers for family, friends, the hospital, and police and fire departments. General Tips  Ask your health care provider for a referral to a local prenatal education class. Begin classes no later than at the beginning of month 6 of your pregnancy.  Ask for help if you have counseling or nutritional needs during pregnancy. Your health care provider can offer advice or refer you to specialists for help with various needs.  Do not use hot tubs, steam rooms, or saunas.  Do not douche or use tampons or scented sanitary pads.  Do not cross your legs for long periods of time.  Avoid cat litter boxes and soil used by cats. These carry germs that can cause birth defects in the baby and possibly loss of the fetus by miscarriage or stillbirth.  Avoid all smoking, herbs, alcohol, and medicines not prescribed by your health care provider. Chemicals in these affect the formation and growth of the baby.  Schedule a dentist appointment. At home, brush your teeth with a soft toothbrush and be gentle when you floss. SEEK MEDICAL CARE IF:   You have dizziness.  You have mild pelvic cramps, pelvic pressure, or nagging pain in the abdominal area.  You have persistent nausea, vomiting, or diarrhea.  You have a bad smelling vaginal discharge.  You have pain with urination.  You notice increased swelling in your face, hands, legs, or ankles. SEEK IMMEDIATE MEDICAL CARE IF:   You have a fever.  You are leaking fluid from your vagina.  You have spotting or bleeding from your vagina.  You have severe abdominal cramping or pain.  You have rapid weight gain or loss.  You vomit blood or material that looks like coffee grounds.  You are exposed to [place] measles and have never had them.  You are exposed to fifth disease or  chickenpox.  You develop a severe headache.  You have shortness of breath.  You have any kind of trauma, such as from a fall or a car accident. Document Released: 11/06/2001 Document Revised: 03/29/2014 Document Reviewed: 09/22/2013 [facility] Patient Information 2015 [place], [place]. This information is not intended to replace advice given to you by your health care  provider. Make sure you discuss any questions you have with your health care provider.

## 2014-07-14 NOTE — Progress Notes (Signed)
Subjective:  Theresa Malone is a 33 y.o. [id number] African American female at 6746w6d by LMP c/w 7wk u/s, being seen today for her first obstetrical visit.  Her obstetrical history is significant for obesity, 1st pregnancy preterm SVD @ 30wks after SOL- was thought to be d/t possible chorio per pt, then 2nd c/s for FTP @ 7cm, 3rd pregnancy oligo and RLTCS. Dx w/ DM 1st pregnancy at age 89yo- never went away, taking glyburide 5mg  bid now. CHTN- taking labetalol 200mg  bid now. .  [facility]  [facility]. Was on Januvia 100mg  daily for Class B DM prior to pregnancy. Last visit to eye doctor was last year- all was normal, time to go again. Hasn't been checking cbg's b/c she's out of strips and she doesn't have insurance right now. Applied for [employer] last week.  No problems w/ PTL w/ any subsequent pregnancies after 1st.Pregnancy history fully reviewed.  Patient reports occ dizziness. No n/v. Denies vb, cramping, uti s/s, abnormal/malodorous vag d/c, or vulvovaginal itching/irritation.   BP 126/78  Wt 255 lb (115.667 kg)  LMP 05/13/2014  HISTORY: OB History  Gravida Para Term Preterm AB SAB TAB Ectopic Multiple Living  4 3 2 1      3     # Outcome Date GA Lbr Len/2nd Weight Sex Delivery Anes PTL Lv  4 CUR           3 TRM 08/11/10 561w0d  10 lb 4 oz (4.649 kg) M LTCS EPI  Y  2 TRM 02/13/06 5544w0d  9 lb 6.6 oz (4.269 kg) F LTCS EPI  Y  1 PRE 12/09/04 2271w0d  5 lb 6.6 oz (2.455 kg) F SVD EPI  Y     Past Medical History  Diagnosis Date  . Hypertension   . Diabetes mellitus    Past Surgical History  Procedure Laterality Date  . Cesarean section     Family History  Problem Relation Age of Onset  . Diabetes Paternal Grandfather   . Cancer Paternal Grandmother     liver  . Hypertension Father   . Diabetes Mother   . Hypertension Mother   . Diabetes Maternal Uncle   . Diabetes Maternal Grandmother   . Cancer Maternal Grandfather     Exam   System:     General: Well  developed & nourished, no acute distress   Skin: Warm & dry, normal coloration and turgor, no rashes   Neurologic: Alert & oriented, normal mood   Cardiovascular: Regular rate & rhythm   Respiratory: Effort & rate normal, LCTAB, acyanotic   Abdomen: Soft, non tender   Extremities: normal strength, tone   Pelvic Exam:    Perineum: Normal perineum   Vulva: Normal, no lesions   Vagina:  Normal mucosa, normal discharge   Cervix: Normal, bulbous, appears closed   Uterus: Normal size/shape/contour for GA   Thin prep pap smear obtained w/ high risk HPV cotesting FHR: 165 via informal transabdominal u/s   Assessment:   Pregnancy: [id number] Patient Active Problem List   Diagnosis Date Noted  . Supervision of other high-risk pregnancy(V23.89) 07/14/2014    Priority: High  . Benign essential hypertension antepartum 03/03/2014    Priority: High  . Diabetes mellitus, antepartum, Class B(648.03) 03/03/2014    Priority: High  . Morbid obesity with BMI of 45.0-49.9, adult 03/03/2014    Priority: High    10146w6d G4P2103 New OB visit Obesity CHTN, on labetalol Class B DM, on glyburide Prev  c/s x2 Prev spontaneous PTB @ 30wks    Plan:  Initial labs drawn, including A1C, 24hr urine for baseline protein Continue prenatal vitamins Problem list reviewed and updated Reviewed n/v relief measures and warning s/s to report Reviewed recommended weight gain based on pre-gravid BMI Encouraged well-balanced diet Genetic Screening discussed Integrated Screen: requested Cystic fibrosis screening discussed requested Ultrasound discussed; fetal survey: requested Follow up in 3 weeks for 1st nt/it and hrob visit w/ MD To get strips and start checking cbg's QID- gave printed info w/ this and goals Placed referral for diabetes educator/dietician for refresher on diet for pregnancy/DM CCNC completed Begin baby asa @ [address]   Marge Duncans CNM, [facility] 07/14/2014 9:47 AM

## 2014-07-15 LAB — URINALYSIS, ROUTINE W REFLEX MICROSCOPIC
Bilirubin Urine: NEGATIVE
Glucose, UA: NEGATIVE mg/dL
Hgb urine dipstick: NEGATIVE
KETONES UR: NEGATIVE mg/dL
Leukocytes, UA: NEGATIVE
NITRITE: NEGATIVE
Protein, ur: NEGATIVE mg/dL
Specific Gravity, Urine: 1.025 (ref 1.005–1.030)
UROBILINOGEN UA: 0.2 mg/dL (ref 0.0–1.0)
pH: 5.5 (ref 5.0–8.0)

## 2014-07-15 LAB — DRUG SCREEN, URINE, NO CONFIRMATION
Amphetamine Screen, Ur: NEGATIVE
BARBITURATE QUANT UR: NEGATIVE
Benzodiazepines.: NEGATIVE
CREATININE, U: 236.8 mg/dL
Cocaine Metabolites: NEGATIVE
METHADONE: NEGATIVE
Marijuana Metabolite: NEGATIVE
Opiate Screen, Urine: NEGATIVE
Phencyclidine (PCP): NEGATIVE
Propoxyphene: NEGATIVE

## 2014-07-15 LAB — HIV ANTIBODY (ROUTINE TESTING W REFLEX): HIV 1&2 Ab, 4th Generation: NONREACTIVE

## 2014-07-15 LAB — RPR

## 2014-07-15 LAB — CYTOLOGY - PAP

## 2014-07-15 LAB — VARICELLA ZOSTER ANTIBODY, IGG: Varicella IgG: 86.3 Index (ref <135.00)

## 2014-07-15 LAB — ABO AND RH: Rh Type: POSITIVE

## 2014-07-15 LAB — HEPATITIS B SURFACE ANTIGEN: Hepatitis B Surface Ag: NEGATIVE

## 2014-07-15 LAB — CYSTIC FIBROSIS DIAGNOSTIC STUDY

## 2014-07-15 LAB — RUBELLA SCREEN: RUBELLA: 1.94 {index} — AB (ref <0.90)

## 2014-07-15 LAB — SICKLE CELL SCREEN: SICKLE CELL SCREEN: NEGATIVE

## 2014-07-15 LAB — OXYCODONE SCREEN, UA, RFLX CONFIRM: OXYCODONE SCRN UR: NEGATIVE ng/mL

## 2014-07-15 LAB — ANTIBODY SCREEN: Antibody Screen: NEGATIVE

## 2014-07-16 LAB — URINE CULTURE: Colony Count: 5000

## 2014-07-17 LAB — PROTEIN, URINE, 24 HOUR
PROTEIN, URINE: 5 mg/dL
Protein, 24H Urine: 80 mg/d (ref 50–100)

## 2014-07-19 ENCOUNTER — Telehealth: Payer: Self-pay | Admitting: Women's Health

## 2014-07-19 NOTE — Telephone Encounter (Signed)
Patient states she has some boils on arm that are hurting and leaking green pus, advised pt she needs to be seen and call transferred to front staff for appointment to be made.

## 2014-07-20 ENCOUNTER — Encounter: Payer: Self-pay | Admitting: Advanced Practice Midwife

## 2014-07-20 ENCOUNTER — Ambulatory Visit (INDEPENDENT_AMBULATORY_CARE_PROVIDER_SITE_OTHER): Payer: Medicaid Other | Admitting: Advanced Practice Midwife

## 2014-07-20 VITALS — BP 98/62 | Wt 255.0 lb

## 2014-07-20 DIAGNOSIS — Z1389 Encounter for screening for other disorder: Secondary | ICD-10-CM

## 2014-07-20 DIAGNOSIS — Z331 Pregnant state, incidental: Secondary | ICD-10-CM

## 2014-07-20 DIAGNOSIS — L732 Hidradenitis suppurativa: Secondary | ICD-10-CM | POA: Insufficient documentation

## 2014-07-20 LAB — POCT URINALYSIS DIPSTICK
Blood, UA: NEGATIVE
Glucose, UA: NEGATIVE
Ketones, UA: NEGATIVE
LEUKOCYTES UA: NEGATIVE
Nitrite, UA: NEGATIVE
PROTEIN UA: NEGATIVE

## 2014-07-20 MED ORDER — CLINDAMYCIN HCL 300 MG PO CAPS: 300.0000 mg | ORAL_CAPSULE | Freq: Three times a day (TID) | ORAL | Status: DC

## 2014-07-20 MED ORDER — METRONIDAZOLE 500 MG PO TABS: 500.0000 mg | ORAL_TABLET | Freq: Two times a day (BID) | ORAL | Status: DC

## 2014-07-20 NOTE — Progress Notes (Signed)
[facility] Visit  Patient name: Theresa Malone MRN [id number]  Date of birth: [birth year]  CC & HPI:  MAUREENA DABBS is a 33 y.o. African American female presenting today for c/o "boils" on arms leaking green pus for 3 days.  She is 9.[redacted] weeks pregnant, Class B diabetes and has CHTN.  Has had this before, also in groin region  Pertinent History Reviewed:  Medical & Surgical Hx:   Past Medical History  Diagnosis Date  . Hypertension   . Diabetes mellitus    Past Surgical History  Procedure Laterality Date  . Cesarean section     Medications: Reviewed & Updated - see associated section Social History: Reviewed -  reports that she has never smoked. She has never used smokeless tobacco.  Objective Findings:  Vitals: BP 98/62  Wt 255 lb (115.667 kg)  LMP 05/13/2014  Physical Examination: General appearance - alert, well appearing, and in no distress Mental status - alert, oriented to person, place, and time Extremities - bilateral axillary abscesses  Results for orders placed in visit on 07/20/14 (from the past 24 hour(s))  POCT URINALYSIS DIPSTICK   Collection Time    07/20/14 10:32 AM      Result Value Ref Range   Color, UA       Clarity, UA       Glucose, UA neg     Bilirubin, UA       Ketones, UA neg     Spec Grav, UA       Blood, UA neg     pH, UA       Protein, UA neg     Urobilinogen, UA       Nitrite, UA neg     Leukocytes, UA Negative       Assessment & Plan:  A:   Hydradenitis suppurativa P:  Cleocin  TID X 14 days  Flagyl  BID X 14 days  Warm compresses  Wound culture taken   F/U as scheduled for NT/IT   CRESENZO-DISHMAN,Charles Andringa CNM 07/20/2014 10:54 AM

## 2014-07-23 LAB — WOUND CULTURE: Gram Stain: NONE SEEN

## 2014-07-24 ENCOUNTER — Encounter: Payer: Self-pay | Admitting: Women's Health

## 2014-07-24 DIAGNOSIS — O09899 Supervision of other high risk pregnancies, unspecified trimester: Secondary | ICD-10-CM | POA: Insufficient documentation

## 2014-07-24 DIAGNOSIS — Z2839 Other underimmunization status: Secondary | ICD-10-CM | POA: Insufficient documentation

## 2014-07-24 DIAGNOSIS — Z283 Underimmunization status: Secondary | ICD-10-CM

## 2014-07-27 ENCOUNTER — Encounter: Payer: Self-pay | Admitting: *Deleted

## 2014-07-27 ENCOUNTER — Encounter: Payer: Medicaid Other | Attending: Pediatric Endocrinology | Admitting: *Deleted

## 2014-07-27 VITALS — Ht 61.0 in | Wt 254.8 lb

## 2014-07-27 DIAGNOSIS — Z713 Dietary counseling and surveillance: Secondary | ICD-10-CM | POA: Insufficient documentation

## 2014-07-27 DIAGNOSIS — O99810 Abnormal glucose complicating pregnancy: Secondary | ICD-10-CM | POA: Diagnosis not present

## 2014-07-27 DIAGNOSIS — O24919 Unspecified diabetes mellitus in pregnancy, unspecified trimester: Secondary | ICD-10-CM

## 2014-07-27 NOTE — Progress Notes (Signed)
Appt start time: 1530 end time:  1630.  Assessment:  Patient was seen on  07/27/14 for individual diabetes education complicated by pregnancy. Theresa Malone is pregnant with her 4th pregnancy. She has a history a GDM X2 and now T2DM. She is not testing her glucose at present due to not having any test strips. She has now obtained pregnancy Medicaid and will begin testing again today. She will be testing , FBS and 2hpp breakfast, lunch and dinner.  Patient Education Plan per assessed needs and concerns is to attend individual session for Diabetes Self Management Education.  Current HbA1c: 7.9%  Preferred Learning Style:   No preference indicated   Learning Readiness:   Change in progress  MEDICATIONS: See list: glyburide  Usual physical activity: None   Intervention:  Nutrition counseling provided.  Discussed diabetes disease process and treatment options.  Discussed physiology of diabetes and role of obesity on insulin resistance.  Encouraged moderate weight reduction following delivery of baby to improve glucose levels.  Discussed role of medications and diet in glucose control  Provided education on macronutrients on glucose levels.  Provided education on carb counting, importance of regularly scheduled meals/snacks, and meal planning  Discussed effects of physical activity on glucose levels and long-term glucose control.  Recommended 150 minutes of physical activity/week.  Reviewed patient medications.  Discussed role of medication on blood glucose and possible side effects  Discussed blood glucose monitoring and interpretation.  Discussed recommended target ranges and individual ranges.    Described short-term complications: hyper- and hypo-glycemia.  Discussed causes,symptoms, and treatment options.  Discussed prevention, detection, and treatment of long-term complications.  Discussed the role of prolonged elevated glucose levels on body systems.  Discussed role of stress on blood  glucose levels and discussed strategies to manage psychosocial issues.  Plan:  Aim for 2 Carb Choices per meal (30 grams) +/- 1 either way for breakfast Aim for 3 Carb Choices per meal (45 grams) +/- 1 either way from lunch and dinner Aim for 1-2 Carbs per snack  Teaching Method Utilized:  Visual Auditory Hands on  Handouts given during visit include: Living Well with Diabetes Carb Counting  Meal Plan Card Nutrition, Diabetes & Pregnancy  Barriers to learning/adherence to lifestyle change: finances  Diabetes self-care support plan:   [facility] support group  Demonstrated degree of understanding via:  Teach Back   Monitoring/Evaluation:  Dietary intake, exercise, monitor glucose, and body weight prn.

## 2014-07-28 ENCOUNTER — Telehealth: Payer: Self-pay | Admitting: Adult Health

## 2014-07-28 NOTE — Telephone Encounter (Signed)
Pt states she was having some pressure yesterday and this morning when went to bathroom saw a trace of blood when wiped, no other bleeding since and she is having cramps today, she reports that she has had sex recently.  Advised pt to push fluids, rest and take Tylenol and if no improvement in cramps or if they get worse or she starts bleeding to call us back.  Pt verbalized understanding.

## 2014-08-02 ENCOUNTER — Other Ambulatory Visit: Payer: Self-pay | Admitting: Advanced Practice Midwife

## 2014-08-02 DIAGNOSIS — L732 Hidradenitis suppurativa: Secondary | ICD-10-CM

## 2014-08-02 MED ORDER — SULFAMETHOXAZOLE-TMP DS 800-160 MG PO TABS: 1.0000 | ORAL_TABLET | Freq: Two times a day (BID) | ORAL | Status: DC

## 2014-08-02 NOTE — Progress Notes (Unsigned)
Axillary lesions grew GBS, abx changed to bactrim

## 2014-08-04 ENCOUNTER — Other Ambulatory Visit: Payer: Self-pay

## 2014-08-04 ENCOUNTER — Encounter: Payer: Self-pay | Admitting: Obstetrics and Gynecology

## 2014-08-10 ENCOUNTER — Ambulatory Visit (INDEPENDENT_AMBULATORY_CARE_PROVIDER_SITE_OTHER): Payer: Medicaid Other

## 2014-08-10 ENCOUNTER — Other Ambulatory Visit: Payer: Self-pay | Admitting: Women's Health

## 2014-08-10 ENCOUNTER — Ambulatory Visit (INDEPENDENT_AMBULATORY_CARE_PROVIDER_SITE_OTHER): Payer: Medicaid Other | Admitting: Obstetrics and Gynecology

## 2014-08-10 ENCOUNTER — Encounter: Payer: Self-pay | Admitting: Obstetrics and Gynecology

## 2014-08-10 ENCOUNTER — Telehealth: Payer: Self-pay | Admitting: Obstetrics and Gynecology

## 2014-08-10 VITALS — BP 122/76 | Wt 255.0 lb

## 2014-08-10 DIAGNOSIS — O09899 Supervision of other high risk pregnancies, unspecified trimester: Secondary | ICD-10-CM

## 2014-08-10 DIAGNOSIS — O09219 Supervision of pregnancy with history of pre-term labor, unspecified trimester: Secondary | ICD-10-CM

## 2014-08-10 DIAGNOSIS — O09211 Supervision of pregnancy with history of pre-term labor, first trimester: Secondary | ICD-10-CM

## 2014-08-10 DIAGNOSIS — Z36 Encounter for antenatal screening of mother: Secondary | ICD-10-CM

## 2014-08-10 DIAGNOSIS — Z331 Pregnant state, incidental: Secondary | ICD-10-CM

## 2014-08-10 DIAGNOSIS — O161 Unspecified maternal hypertension, first trimester: Secondary | ICD-10-CM

## 2014-08-10 DIAGNOSIS — L732 Hidradenitis suppurativa: Secondary | ICD-10-CM

## 2014-08-10 DIAGNOSIS — O24311 Unspecified pre-existing diabetes mellitus in pregnancy, first trimester: Secondary | ICD-10-CM

## 2014-08-10 DIAGNOSIS — O169 Unspecified maternal hypertension, unspecified trimester: Secondary | ICD-10-CM

## 2014-08-10 DIAGNOSIS — O09891 Supervision of other high risk pregnancies, first trimester: Secondary | ICD-10-CM

## 2014-08-10 DIAGNOSIS — O24919 Unspecified diabetes mellitus in pregnancy, unspecified trimester: Secondary | ICD-10-CM

## 2014-08-10 DIAGNOSIS — Z1389 Encounter for screening for other disorder: Secondary | ICD-10-CM

## 2014-08-10 LAB — POCT URINALYSIS DIPSTICK
Blood, UA: NEGATIVE
Leukocytes, UA: NEGATIVE
NITRITE UA: NEGATIVE

## 2014-08-10 MED ORDER — INSULIN GLARGINE 100 UNIT/ML ~~LOC~~ SOLN: 15.0000 [IU] | Freq: Every day | SUBCUTANEOUS | Status: DC

## 2014-08-10 NOTE — Telephone Encounter (Signed)
phnone numbers corrected

## 2014-08-10 NOTE — Progress Notes (Signed)
High Risk Pregnancy Diagnosis(es):  1. 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  Urinalysis: neg HPI:CBG's160-248fasting,    pc's up to 300   BP weight and urine results all reviewed and noted. Patient reports good fetal movement, denies any bleeding and no rupture of membranes symptoms or regular contractions.  Fundal Height:  n/a Fetal Heart rate:  156 Edema:  neg  Patient is without complaints. All questions were answered.  Assessment:  [redacted]w[redacted]d,   Poor diabetic control  Medication(s) Plans:  Ad Lantus 15 u hs  Treatment Plan:  Weekly visit til controlled  Follow up in 1 weeks for OB appt, cbg review

## 2014-08-10 NOTE — Progress Notes (Signed)
Pt denies any problems or concerns at this time. Pt wants to discuss number of bowel movements she should be having.

## 2014-08-10 NOTE — Patient Instructions (Signed)
Add lantus 15 units HS

## 2014-08-10 NOTE — Progress Notes (Signed)
U/S(12+5wks)-single active fetus, CRL c/w dates, cx appears closed, posterior Gr 0 placenta, bilateral adnexa appears WNL, FHR-156 bpm, NB present, NT-1.38mm

## 2014-08-12 ENCOUNTER — Other Ambulatory Visit: Payer: Self-pay | Admitting: Adult Health

## 2014-08-12 ENCOUNTER — Telehealth: Payer: Self-pay | Admitting: Obstetrics and Gynecology

## 2014-08-12 ENCOUNTER — Telehealth: Payer: Self-pay | Admitting: *Deleted

## 2014-08-12 ENCOUNTER — Other Ambulatory Visit: Payer: Self-pay | Admitting: Obstetrics and Gynecology

## 2014-08-12 MED ORDER — "INSULIN SYRINGE 29G X 1/2"" 1 ML MISC": 1.0000 | Freq: Every day | Status: DC

## 2014-08-12 MED ORDER — GLYBURIDE 5 MG PO TABS: 5.0000 mg | ORAL_TABLET | Freq: Two times a day (BID) | ORAL | Status: DC

## 2014-08-12 MED ORDER — GLYBURIDE 5 MG PO TABS: ORAL_TABLET | ORAL | Status: DC

## 2014-08-12 NOTE — Telephone Encounter (Signed)
Pt is aware that JVF is out of the office and that as soon as I speak with him I would let her know. PT verbalized understanding.

## 2014-08-12 NOTE — Telephone Encounter (Signed)
Pt aware that Rx's have been sent to the pharmacy. Pt verbalized understanding.

## 2014-08-13 LAB — MATERNAL SCREEN, INTEGRATED #1

## 2014-08-16 ENCOUNTER — Ambulatory Visit (INDEPENDENT_AMBULATORY_CARE_PROVIDER_SITE_OTHER): Payer: Medicaid Other | Admitting: Obstetrics and Gynecology

## 2014-08-16 ENCOUNTER — Encounter: Payer: Self-pay | Admitting: Obstetrics and Gynecology

## 2014-08-16 VITALS — BP 120/76 | Wt 256.0 lb

## 2014-08-16 DIAGNOSIS — O24812 Other pre-existing diabetes mellitus in pregnancy, second trimester: Secondary | ICD-10-CM

## 2014-08-16 DIAGNOSIS — Z1389 Encounter for screening for other disorder: Secondary | ICD-10-CM

## 2014-08-16 DIAGNOSIS — O10912 Unspecified pre-existing hypertension complicating pregnancy, second trimester: Secondary | ICD-10-CM

## 2014-08-16 DIAGNOSIS — Z331 Pregnant state, incidental: Secondary | ICD-10-CM

## 2014-08-16 DIAGNOSIS — O24919 Unspecified diabetes mellitus in pregnancy, unspecified trimester: Secondary | ICD-10-CM

## 2014-08-16 DIAGNOSIS — O10019 Pre-existing essential hypertension complicating pregnancy, unspecified trimester: Secondary | ICD-10-CM

## 2014-08-16 DIAGNOSIS — O09219 Supervision of pregnancy with history of pre-term labor, unspecified trimester: Secondary | ICD-10-CM

## 2014-08-16 DIAGNOSIS — O09899 Supervision of other high risk pregnancies, unspecified trimester: Secondary | ICD-10-CM

## 2014-08-16 LAB — POCT URINALYSIS DIPSTICK
Glucose, UA: NEGATIVE
Ketones, UA: NEGATIVE
Leukocytes, UA: NEGATIVE
Nitrite, UA: NEGATIVE
Protein, UA: NEGATIVE
RBC UA: NEGATIVE

## 2014-08-16 MED ORDER — GLYBURIDE 5 MG PO TABS: ORAL_TABLET | ORAL | Status: DC

## 2014-08-16 MED ORDER — INSULIN GLARGINE 100 UNIT/ML ~~LOC~~ SOLN: 25.0000 [IU] | Freq: Every day | SUBCUTANEOUS | Status: DC

## 2014-08-16 NOTE — Progress Notes (Signed)
Pt states that she is having a lot of pressure on her right side. Pt denies any other problems or concerns at this time.

## 2014-08-16 NOTE — Progress Notes (Signed)
Disp; Rx glyburide 10 BID and increase lantus to 25 units HS.

## 2014-08-16 NOTE — Progress Notes (Signed)
Patient ID: Theresa Malone, female   DOB: 1981-07-18, 33 y.o.   MRN: [id number]  [id number] [redacted]w[redacted]d Estimated Date of Delivery: 02/17/15  Blood pressure 120/76, weight 256 lb (116.121 kg), last menstrual period 05/13/2014.   refer to the ob flow sheet for FH and FHR, also BP, Wt, Urine results: negative  Patient reports good fetal movement, denies any bleeding and no rupture of membranes symptoms or regular contractions.    Patient complaints: Hyperglycemia. She has been checking her sugar levels regularly.  They have been between 140 and 150 in the morning.  Her sugar levels have not been over 200 since her last visit.  After she eats her sugars will be between 150 and 170.  She has been taking Lantus once at night and  Glyburide twice a day.  She has felt side effects of hypoglycemia in the past and was given a Glucagon.      Questions were answered. Assessment: [redacted]w[redacted]d, [id number] Plan:  Continued routine obstetrical care, will increase Glyburide dosage to 10 bid, and Lantus dosage to 25 units.    F/u in 1 weeks

## 2014-08-27 ENCOUNTER — Telehealth: Payer: Self-pay | Admitting: *Deleted

## 2014-08-27 NOTE — Telephone Encounter (Signed)
Pt states she has not felt the baby move today, no vaginal bleeding, no cramping. Pt informed not unusual this early in the pregnancy not to feel fetal movement consistently. Pt verbalized understanding.

## 2014-08-30 ENCOUNTER — Ambulatory Visit (INDEPENDENT_AMBULATORY_CARE_PROVIDER_SITE_OTHER): Payer: Medicaid Other | Admitting: Obstetrics and Gynecology

## 2014-08-30 ENCOUNTER — Encounter: Payer: Self-pay | Admitting: Obstetrics and Gynecology

## 2014-08-30 VITALS — BP 104/66 | Wt 257.0 lb

## 2014-08-30 DIAGNOSIS — O24912 Unspecified diabetes mellitus in pregnancy, second trimester: Secondary | ICD-10-CM

## 2014-08-30 DIAGNOSIS — Z331 Pregnant state, incidental: Secondary | ICD-10-CM

## 2014-08-30 DIAGNOSIS — Z1389 Encounter for screening for other disorder: Secondary | ICD-10-CM

## 2014-08-30 DIAGNOSIS — O10012 Pre-existing essential hypertension complicating pregnancy, second trimester: Secondary | ICD-10-CM

## 2014-08-30 DIAGNOSIS — O09892 Supervision of other high risk pregnancies, second trimester: Secondary | ICD-10-CM

## 2014-08-30 DIAGNOSIS — Z369 Encounter for antenatal screening, unspecified: Secondary | ICD-10-CM

## 2014-08-30 LAB — POCT URINALYSIS DIPSTICK
Blood, UA: NEGATIVE
GLUCOSE UA: NEGATIVE
Ketones, UA: NEGATIVE
Leukocytes, UA: NEGATIVE
Nitrite, UA: NEGATIVE
Protein, UA: NEGATIVE

## 2014-08-30 NOTE — Progress Notes (Signed)
Patient ID: Theresa Malone, female   DOB: 03/16/1981, 33 y.o.   MRN: [id number]  High Risk Pregnancy Diagnosis(es):   DM-B and chronic Hypertension  G4P2103 7255w4d Estimated Date of Delivery: 02/17/15  Blood pressure 104/66, weight 257 lb (116.574 kg), last menstrual period 05/13/2014.  Urinalysis: Negative HPI: Pt is complaining of an increase in vaginal discharge that is thick and clear and intermittent cramping.  She denies any odor or irritation.  Her fasting blood sugar is 178 today.  She was taking insulin during her last pregnancy.  She is not symptomatic at 90 but is symptomatic at 75.  She takes 10mg  of Glyburide before breakfast and dinner and 25 units of Lantus at night.    BP weight and urine results all reviewed and noted.   Lengthy discussion of DM medication and diet. Pt will adjust meals , smaller, more frequent  Fundal Height:  n/a Fetal Heart rate:  152 Edema:  none  Assessment:  5655w4d, [id number]   Medication(s) Plans:  30 units of Lantus plus better spacing of diet Treatment Plan:  Encouraged pt to eat a smaller,healthier diet and pay more attention to portion control, pt motivated  Follow up in 2 weeks for OB appt,   This chart was scribed for Tilda BurrowJohn Ferguson V, MD by Carl Bestelina Holson, ED Scribe. This patient was seen in room 3 and the patient's care was started at 11:00 AM.

## 2014-08-30 NOTE — Progress Notes (Signed)
Pt states that she has noticed an increase in discharge, it thick and clear. Pt denies any odor, or irritation. Pt states that she does still have some cramping at times.

## 2014-09-01 ENCOUNTER — Telehealth: Payer: Self-pay | Admitting: Obstetrics and Gynecology

## 2014-09-01 NOTE — Telephone Encounter (Signed)
Pt states she is out of lancets and strips because she is checking her blood sugars 4 x daily verses 2 x day. Please clarify how often pt needs to check her blood sugars with pharmacy, so pt can have what she needs to check her blood sugars accurately.

## 2014-09-02 LAB — MATERNAL SCREEN, INTEGRATED #2
AFP MoM: 0.71
AFP, Serum: 17.2 ng/mL
CALCULATED GESTATIONAL AGE MAT SCREEN: 15.1
Crown Rump Length: 60.5 mm
Estriol Mom: 0.84
Estriol, Free: 0.45 ng/mL
INHIBIN A MOM MAT SCREEN: 0.63
Inhibin A Dimeric: 90 pg/mL
MSS Down Syndrome: <1:5000 {titer}
NT MOM MAT SCREEN: 1.02
NUMBER OF FETUSES MAT SCREEN 2: 1
Nuchal Translucency: 1.42 mm
PAPP-A MOM MAT SCREEN: 0.42
PAPP-A: 210 ng/mL
hCG MoM: 0.83
hCG, Serum: 32.3 IU/mL

## 2014-09-03 NOTE — Telephone Encounter (Signed)
Pt is testing self up to qid. Please call in correct info to Pharmacy

## 2014-09-06 ENCOUNTER — Encounter: Payer: Self-pay | Admitting: Obstetrics and Gynecology

## 2014-09-06 NOTE — Telephone Encounter (Signed)
Called [facility] pharmacy in [place] and clarified order for lantus and strips per Dr.Ferguson. Pt checks blood sugars 4 times daily.

## 2014-09-13 ENCOUNTER — Encounter: Payer: Medicaid Other | Admitting: Obstetrics and Gynecology

## 2014-09-15 ENCOUNTER — Encounter: Payer: Self-pay | Admitting: Obstetrics and Gynecology

## 2014-09-15 ENCOUNTER — Ambulatory Visit (INDEPENDENT_AMBULATORY_CARE_PROVIDER_SITE_OTHER): Payer: Medicaid Other | Admitting: Obstetrics and Gynecology

## 2014-09-15 VITALS — BP 112/62 | Wt 255.5 lb

## 2014-09-15 DIAGNOSIS — O09892 Supervision of other high risk pregnancies, second trimester: Secondary | ICD-10-CM

## 2014-09-15 DIAGNOSIS — Z331 Pregnant state, incidental: Secondary | ICD-10-CM

## 2014-09-15 DIAGNOSIS — O24912 Unspecified diabetes mellitus in pregnancy, second trimester: Secondary | ICD-10-CM

## 2014-09-15 DIAGNOSIS — O09212 Supervision of pregnancy with history of pre-term labor, second trimester: Secondary | ICD-10-CM

## 2014-09-15 DIAGNOSIS — L732 Hidradenitis suppurativa: Secondary | ICD-10-CM

## 2014-09-15 DIAGNOSIS — Z1389 Encounter for screening for other disorder: Secondary | ICD-10-CM

## 2014-09-15 DIAGNOSIS — R309 Painful micturition, unspecified: Secondary | ICD-10-CM

## 2014-09-15 LAB — POCT URINALYSIS DIPSTICK
Glucose, UA: NEGATIVE
Ketones, UA: NEGATIVE
Leukocytes, UA: NEGATIVE
Nitrite, UA: NEGATIVE
Protein, UA: NEGATIVE
RBC UA: 3

## 2014-09-15 MED ORDER — NITROFURANTOIN MONOHYD MACRO 100 MG PO CAPS: 100.0000 mg | ORAL_CAPSULE | Freq: Two times a day (BID) | ORAL | Status: DC

## 2014-09-15 MED ORDER — INSULIN ASPART 100 UNIT/ML ~~LOC~~ SOLN: 4.0000 [IU] | Freq: Three times a day (TID) | SUBCUTANEOUS | Status: DC

## 2014-09-15 NOTE — Progress Notes (Signed)
High Risk Pregnancy Diagnosis(es):   DM-B and chronic HTN G4P2103 7827w6d Estimated Date of Delivery: 02/17/15  Blood pressure 112/62, weight 255 lb 8 oz (115.894 kg), last menstrual period 05/13/2014.  Urinalysis: Positive for blood in urine otherwise negative   HPI: She states that she thinks that she has a UTI. Pt states that she has a lot of pressure and her stomach hurts. Pt states that she has had the symptoms since Saturday. She states that it hurts inside. She states that she is on 30 units of Lantus. Fastings are 131-172. Breakfast pc are good and under 140. Lunch pc is highly variable from 87-224.   BP weight and urine results all reviewed and noted. Patient reports good fetal movement, denies any bleeding and no rupture of membranes symptoms or regular contractions.  Fundal Height:  Not indicated Fetal Heart rate:  138 Edema:  negative I&D of 1 cm left axillary hidradenitis opened under local anesthesia. Granulation tissue treated with silver nitrate and partial extraction of granulation tissue.   Patient is without complaints. All questions were answered.  Assessment:  5127w6d,                n                  UTI,                        hidradenitis Medication(s) Plans:  Add 4 units of novolog with breakfast and lunch and 6 units at bedtime.                                    Continue Lantus 30 hs,                                    Continue Glyburide 10 bid                                     Consider switch to NPH instead of Lantus if Fasting am cbg's remain high   Treatment Plan:  Rx hidranitis with I &D, continue ABX Follow up in 1 weeks for OB appt, aid with DM   This chart was scribed for Tilda BurrowJohn Luiza Carranco V, MD by Chestine SporeSoijett Blue, ED Scribe. The patient was seen in room 2 at 11:07 AM.

## 2014-09-16 LAB — URINALYSIS
BILIRUBIN URINE: NEGATIVE
Glucose, UA: NEGATIVE mg/dL
KETONES UR: NEGATIVE mg/dL
Leukocytes, UA: NEGATIVE
Nitrite: NEGATIVE
PROTEIN: NEGATIVE mg/dL
SPECIFIC GRAVITY, URINE: 1.028 (ref 1.005–1.030)
UROBILINOGEN UA: 0.2 mg/dL (ref 0.0–1.0)
pH: 5 (ref 5.0–8.0)

## 2014-09-17 LAB — URINE CULTURE: Colony Count: 60000

## 2014-09-22 ENCOUNTER — Ambulatory Visit (INDEPENDENT_AMBULATORY_CARE_PROVIDER_SITE_OTHER): Payer: Medicaid Other | Admitting: Obstetrics and Gynecology

## 2014-09-22 ENCOUNTER — Encounter: Payer: Self-pay | Admitting: Obstetrics and Gynecology

## 2014-09-22 VITALS — BP 118/76 | Wt 257.0 lb

## 2014-09-22 DIAGNOSIS — O09892 Supervision of other high risk pregnancies, second trimester: Secondary | ICD-10-CM

## 2014-09-22 DIAGNOSIS — O24912 Unspecified diabetes mellitus in pregnancy, second trimester: Secondary | ICD-10-CM

## 2014-09-22 DIAGNOSIS — O09212 Supervision of pregnancy with history of pre-term labor, second trimester: Secondary | ICD-10-CM

## 2014-09-22 DIAGNOSIS — Z331 Pregnant state, incidental: Secondary | ICD-10-CM

## 2014-09-22 DIAGNOSIS — Z1389 Encounter for screening for other disorder: Secondary | ICD-10-CM

## 2014-09-22 LAB — POCT URINALYSIS DIPSTICK
Blood, UA: NEGATIVE
Glucose, UA: NEGATIVE
Ketones, UA: NEGATIVE
Leukocytes, UA: NEGATIVE
Nitrite, UA: NEGATIVE

## 2014-09-22 MED ORDER — INSULIN ASPART 100 UNIT/ML ~~LOC~~ SOLN: 6.0000 [IU] | Freq: Three times a day (TID) | SUBCUTANEOUS | Status: DC

## 2014-09-22 MED ORDER — INSULIN GLARGINE 100 UNIT/ML ~~LOC~~ SOLN: 30.0000 [IU] | Freq: Every day | SUBCUTANEOUS | Status: DC

## 2014-09-22 NOTE — Progress Notes (Signed)
Pt states that she has discomfort with sex and sometimes it hurts. Pt denies any other problems or concerns at this time.

## 2014-09-22 NOTE — Progress Notes (Signed)
High Risk Pregnancy Diagnosis(es):   DM-B and chronic HTN  G4P2103 1680w6d Estimated Date of Delivery: 02/17/15  Blood pressure 118/76, weight 257 lb (116.574 kg), last menstrual period 05/13/2014.  Urinalysis: trace of protein, otherwise negative  HPI: Pt states that she has discomfort with sex and sometimes it hurts. She states that it has only occurred with this pregnancy. She states that her weight has not changed since her last pregnancy. She states that she has been numb at her C-section scar since her first C-section. She denies any other symptoms.  Pt is NOT taking CBG's but once per day. Pt once AGAIN told of importance of 4x/day cbg monitoring, +FM.    BP weight and urine results all reviewed and noted. Patient reports good fetal movement, denies any bleeding and no rupture of membranes symptoms or regular contractions.  Fundal Height:  26 cm, U+6 with laxity of abd wall Fetal Heart rate:  145 Edema:  negative  Patient is without complaints. All questions were answered.  Assessment:  5180w6d,   DM-B and chronic HTN Medication(s) Plans:  Increase Lantus to 6 units for breakfast and 10 for dinner Treatment Plan:  Test Blood Sugar 4 times a day for a week.   Follow up in 1 weeks for OB appt and US  This chart was scribed for Tilda BurrowJohn Demontrez Rindfleisch V, MD by Chestine SporeSoijett Blue, ED Scribe. The patient was seen in room 1 at 9:51 AM.

## 2014-09-27 ENCOUNTER — Encounter: Payer: Self-pay | Admitting: Obstetrics and Gynecology

## 2014-09-29 ENCOUNTER — Ambulatory Visit (INDEPENDENT_AMBULATORY_CARE_PROVIDER_SITE_OTHER): Payer: Medicaid Other | Admitting: Obstetrics and Gynecology

## 2014-09-29 ENCOUNTER — Encounter: Payer: Self-pay | Admitting: Obstetrics and Gynecology

## 2014-09-29 VITALS — BP 120/70 | Wt 258.0 lb

## 2014-09-29 DIAGNOSIS — O24912 Unspecified diabetes mellitus in pregnancy, second trimester: Secondary | ICD-10-CM

## 2014-09-29 DIAGNOSIS — O09892 Supervision of other high risk pregnancies, second trimester: Secondary | ICD-10-CM

## 2014-09-29 DIAGNOSIS — Z1389 Encounter for screening for other disorder: Secondary | ICD-10-CM

## 2014-09-29 DIAGNOSIS — O09212 Supervision of pregnancy with history of pre-term labor, second trimester: Secondary | ICD-10-CM

## 2014-09-29 DIAGNOSIS — Z331 Pregnant state, incidental: Secondary | ICD-10-CM

## 2014-09-29 LAB — POCT URINALYSIS DIPSTICK
Glucose, UA: NEGATIVE
KETONES UA: NEGATIVE
Leukocytes, UA: NEGATIVE
Nitrite, UA: NEGATIVE
Protein, UA: NEGATIVE
RBC UA: NEGATIVE

## 2014-09-29 MED ORDER — INSULIN ASPART 100 UNIT/ML ~~LOC~~ SOLN: 6.0000 [IU] | Freq: Three times a day (TID) | SUBCUTANEOUS | Status: DC

## 2014-09-29 NOTE — Progress Notes (Signed)
High Risk Pregnancy Diagnosis(es):   CHTN, DMA2, repeat c.s and BTL Pt still noting sx of low blood sugar even with cbg of 105 Lunch. Has been back on glyburide 10 bid and Lantus 30 , as Novolog was lost, fell out of refridge. [id number] 6222w6d Estimated Date of Delivery: 02/17/15  Blood pressure 120/70, weight 258 lb (117.028 kg), last menstrual period 05/13/2014.  Urinalysis: Negative cbg's fasting : [phone]     2hr pc more variable: 50%are above 150, only one low 74, was symptomatic. HPI:  BP weight and urine results all reviewed and noted. Patient reports good fetal movement, denies any bleeding and no rupture of membranes symptoms or regular contractions.  Fundal Height:  U+4, 26 Fetal Heart rate:  144 Edema:  Neg.  Patient is without complaints. All questions were answered.  Assessment:  3322w6d,    DMA2 to go back to Novolog and Lantus.  Medication(s) Plans:  As above Treatment Plan:  U/s asap                            2wk  Follow up in 2 weeks for OB appt, u/s ?today

## 2014-09-29 NOTE — Progress Notes (Signed)
Pt states that she has been taking her glyburide due to her insulin falling out of the fridge. Pt states that her blood sugars have been ok but the pharmacy told her it was too early to refill it.

## 2014-10-05 ENCOUNTER — Other Ambulatory Visit: Payer: Self-pay | Admitting: Obstetrics and Gynecology

## 2014-10-05 DIAGNOSIS — O24912 Unspecified diabetes mellitus in pregnancy, second trimester: Secondary | ICD-10-CM

## 2014-10-05 DIAGNOSIS — O10912 Unspecified pre-existing hypertension complicating pregnancy, second trimester: Secondary | ICD-10-CM

## 2014-10-05 DIAGNOSIS — Z3689 Encounter for other specified antenatal screening: Secondary | ICD-10-CM

## 2014-10-06 ENCOUNTER — Other Ambulatory Visit: Payer: Self-pay | Admitting: Obstetrics and Gynecology

## 2014-10-06 ENCOUNTER — Ambulatory Visit (INDEPENDENT_AMBULATORY_CARE_PROVIDER_SITE_OTHER): Payer: Medicaid Other

## 2014-10-06 DIAGNOSIS — O24912 Unspecified diabetes mellitus in pregnancy, second trimester: Secondary | ICD-10-CM

## 2014-10-06 DIAGNOSIS — O09292 Supervision of pregnancy with other poor reproductive or obstetric history, second trimester: Secondary | ICD-10-CM

## 2014-10-06 DIAGNOSIS — O10912 Unspecified pre-existing hypertension complicating pregnancy, second trimester: Secondary | ICD-10-CM

## 2014-10-06 DIAGNOSIS — O34219 Maternal care for unspecified type scar from previous cesarean delivery: Secondary | ICD-10-CM

## 2014-10-06 DIAGNOSIS — O09212 Supervision of pregnancy with history of pre-term labor, second trimester: Secondary | ICD-10-CM

## 2014-10-06 DIAGNOSIS — O3421 Maternal care for scar from previous cesarean delivery: Secondary | ICD-10-CM

## 2014-10-06 DIAGNOSIS — O09892 Supervision of other high risk pregnancies, second trimester: Secondary | ICD-10-CM

## 2014-10-06 DIAGNOSIS — Z3689 Encounter for other specified antenatal screening: Secondary | ICD-10-CM

## 2014-10-06 DIAGNOSIS — Z36 Encounter for antenatal screening of mother: Secondary | ICD-10-CM

## 2014-10-06 NOTE — Progress Notes (Signed)
U/S(20+6wks)-active fetus, meas c/w dates, fluid wnl, posterior Gr 0 placenta, cx appears closed (4.8cm), bilateral adnexa appears WNL, no obvious abnl noted although unable to clearly view cardiac OFT's, FHR-148 bpm, female fetus, would like to reck anatomy ~28 weeks

## 2014-10-13 ENCOUNTER — Encounter: Payer: Medicaid Other | Admitting: Obstetrics and Gynecology

## 2014-10-18 ENCOUNTER — Ambulatory Visit (INDEPENDENT_AMBULATORY_CARE_PROVIDER_SITE_OTHER): Payer: Medicaid Other | Admitting: Obstetrics and Gynecology

## 2014-10-18 ENCOUNTER — Encounter: Payer: Self-pay | Admitting: Obstetrics and Gynecology

## 2014-10-18 VITALS — BP 122/70

## 2014-10-18 DIAGNOSIS — Z331 Pregnant state, incidental: Secondary | ICD-10-CM

## 2014-10-18 DIAGNOSIS — O24912 Unspecified diabetes mellitus in pregnancy, second trimester: Secondary | ICD-10-CM

## 2014-10-18 DIAGNOSIS — O09212 Supervision of pregnancy with history of pre-term labor, second trimester: Secondary | ICD-10-CM

## 2014-10-18 DIAGNOSIS — Z91199 Patient's noncompliance with other medical treatment and regimen due to unspecified reason: Secondary | ICD-10-CM

## 2014-10-18 DIAGNOSIS — Z9119 Patient's noncompliance with other medical treatment and regimen: Secondary | ICD-10-CM

## 2014-10-18 DIAGNOSIS — Z1389 Encounter for screening for other disorder: Secondary | ICD-10-CM

## 2014-10-18 DIAGNOSIS — O09892 Supervision of other high risk pregnancies, second trimester: Secondary | ICD-10-CM

## 2014-10-18 LAB — POCT URINALYSIS DIPSTICK
Blood, UA: NEGATIVE
GLUCOSE UA: NEGATIVE
Ketones, UA: NEGATIVE
Leukocytes, UA: NEGATIVE
NITRITE UA: NEGATIVE
Protein, UA: NEGATIVE

## 2014-10-18 NOTE — Progress Notes (Signed)
Patient ID: Theresa Malone, female   DOB: 1981/03/28, 33 y.o.   MRN: [id number]  High Risk Pregnancy Diagnosis(es):   Gestational DM  [id number] 3846w4d Estimated Date of Delivery: 02/17/15  Blood pressure 122/70, last menstrual period 05/13/2014.  Urinalysis: Negative  HPI: She has not checked her sugars since November 17 because she ran out of strips.  She has rare fasting checks and is averaging about 2-3 checks daily.  Her only fasting sugar was 155 and her post-granules have been 90-222, with half of them above 130.  BP weight and urine results all reviewed and noted. Patient reports good fetal movement, denies any bleeding and no rupture of membranes symptoms or regular contractions.  Fundal Height:  U+14cm Fetal Heart rate:  145 Edema:  none  Patient is complaining of constant cough, headache, and intermittent episodes of epistaxis.  She denies fever as an associated symptom.   All questions were answered.  Assessment:  7346w4d, [id number]  Medication(s) Plans:  Novolog 6 units q meal, to add 4 units before thanksgiving,dinner, and if cbg >250, Lantus HS 30 injection hs, Glyburide 10 mg bid Treatment Plan:  No change in pt's current medications,  Insufficient data  To change Follow up in 2 weeks for OB appt IMPortance of at least TID CBG strongly emphasized to pt.  This chart was scribed for Tilda BurrowJohn Citlally Captain V, MD by Carl Bestelina Holson, ED Scribe. This patient was seen in Room 1 and the patient's care was started at 12:12 PM.

## 2014-10-18 NOTE — Progress Notes (Signed)
Pt states that she has been having headaches, nose bleeds and has cold symptoms.

## 2014-10-26 ENCOUNTER — Ambulatory Visit (INDEPENDENT_AMBULATORY_CARE_PROVIDER_SITE_OTHER): Payer: Medicaid Other | Admitting: Women's Health

## 2014-10-26 ENCOUNTER — Encounter: Payer: Self-pay | Admitting: Women's Health

## 2014-10-26 VITALS — BP 120/68 | Wt 257.0 lb

## 2014-10-26 DIAGNOSIS — O34219 Maternal care for unspecified type scar from previous cesarean delivery: Secondary | ICD-10-CM

## 2014-10-26 DIAGNOSIS — Z1389 Encounter for screening for other disorder: Secondary | ICD-10-CM

## 2014-10-26 DIAGNOSIS — N949 Unspecified condition associated with female genital organs and menstrual cycle: Secondary | ICD-10-CM

## 2014-10-26 DIAGNOSIS — O3421 Maternal care for scar from previous cesarean delivery: Secondary | ICD-10-CM

## 2014-10-26 DIAGNOSIS — Z331 Pregnant state, incidental: Secondary | ICD-10-CM

## 2014-10-26 DIAGNOSIS — R102 Pelvic and perineal pain: Secondary | ICD-10-CM

## 2014-10-26 DIAGNOSIS — Z8751 Personal history of pre-term labor: Secondary | ICD-10-CM

## 2014-10-26 DIAGNOSIS — O09212 Supervision of pregnancy with history of pre-term labor, second trimester: Secondary | ICD-10-CM

## 2014-10-26 DIAGNOSIS — O26892 Other specified pregnancy related conditions, second trimester: Secondary | ICD-10-CM

## 2014-10-26 DIAGNOSIS — O09892 Supervision of other high risk pregnancies, second trimester: Secondary | ICD-10-CM

## 2014-10-26 DIAGNOSIS — O24912 Unspecified diabetes mellitus in pregnancy, second trimester: Secondary | ICD-10-CM

## 2014-10-26 LAB — POCT WET PREP (WET MOUNT): Clue Cells Wet Prep Whiff POC: NEGATIVE

## 2014-10-26 LAB — POCT URINALYSIS DIPSTICK
Blood, UA: NEGATIVE
GLUCOSE UA: NEGATIVE
Leukocytes, UA: NEGATIVE
Nitrite, UA: NEGATIVE

## 2014-10-26 MED ORDER — HYDROXYPROGESTERONE CAPROATE 250 MG/ML IM OIL
250.0000 mg | TOPICAL_OIL | Freq: Once | INTRAMUSCULAR | Status: AC
  Administered 2014-10-26: 250 mg via INTRAMUSCULAR

## 2014-10-26 MED ORDER — "INSULIN SYRINGE 29G X 1/2"" 1 ML MISC": 1.0000 | Freq: Every day | Status: DC

## 2014-10-26 MED ORDER — INSULIN NPH (HUMAN) (ISOPHANE) 100 UNIT/ML ~~LOC~~ SUSP: SUBCUTANEOUS | Status: DC

## 2014-10-26 NOTE — Progress Notes (Signed)
High Risk Pregnancy Diagnosis(es): Uncontrolled Class B DM, CHTN, h/o 30wk PTB [id number] 873w5d Estimated Date of Delivery: 02/17/15 BP 120/68 mmHg  Wt 257 lb (116.574 kg)  LMP 05/13/2014  Urinalysis: Positive for trace protein HPI:  Lots of pelvic pressure this am. Leaking x 1 week, wearing panty liner, thinks its urine. All cbg's out of range, still not checking QID consistently. Some 200 fastings & 2hr pp. Stopped glyburide ~1wk ago b/c she didn't think she was still supposed to be taking it w/ other insulins. So currently per her report, she has been doing: Novolog 6u BID before meals, 10u before supper, Lantus 30u q hs.  BP, weight, and urine reviewed.  Reports good fm. Denies regular uc's, vb, uti s/s. NO abnormal d/c, irritation or odor. Occ itching.   Fundal Height:  U+22 Fetal Heart rate:  135 Edema:  Trace SSE: cx visually closed, small amt thin white nonodorous d/c, no pooling or change w/ valsalva, fFN collected, nitrazine neg, Wet prep: neg, Fern: neg SVE: LTC, fFN not sent   Reviewed sugars w/ LHE. Offered Makena, pt accepted, 1st shot today, and her Makena ordered. Discussed ptl s/s, fm. Discussed importance of strict glycemic control and adherence to low carb diet during pregnancy as well as potential complications from uncontrolled diabetes during pregnancy. To consistently check sugars QID.    All questions were answered and new DM regimen was printed for her on AVS Assessment: 6373w5d uncontrolled ClassB DM, CHTN well controlled on Labetalol, h/o 30wk spontaneous PTB Medication(s) Plans:  Per LHE, change DM regimen to: NPH 20u & Novolog 10u q AM, NPH 10u & Novolog 10u q pm, restart glyburide 10mg  BID, stop Lantus for now. Continue Labetalol. 1st Makena today, then weekly.  Treatment Plan:  Growth/afi u/s q 4wks, will need fetal echo @ 28wks, begin 2x/wk testing @ 32wks Follow up in 1wk for high-risk OB appt, growth/afi u/s, and weekly Makena

## 2014-10-26 NOTE — Patient Instructions (Addendum)
STOP Lantus Restart Glyburide 10mg  in morning and at night AM: NPH 20 units        Novolog 10 units PM: NPH 10 units        Novolog 10 units Check sugars 4 times every day: fasting (in morning before you eat or drink anything), then 2 hours after breakfast, lunch, and supper  Call the office [phone]) or go to [facility] if:  You begin to have strong, frequent contractions  Your water breaks.  Sometimes it is a big gush of fluid, sometimes it is just a trickle that keeps getting your panties wet or running down your legs  You have vaginal bleeding.  It is normal to have a small amount of spotting if your cervix was checked.   You don't feel your baby moving like normal.  If you don't, get you something to eat and drink and lay down and focus on feeling your baby move.  If your baby is still not moving like normal, you should call the office or go to [facility].   Preterm Labor Information Preterm labor is when labor starts at less than 37 weeks of pregnancy. The normal length of a pregnancy is 39 to 41 weeks. CAUSES Often, there is no identifiable underlying cause as to why a woman goes into preterm labor. One of the most common known causes of preterm labor is infection. Infections of the uterus, cervix, vagina, amniotic sac, bladder, kidney, or even the lungs (pneumonia) can cause labor to start. Other suspected causes of preterm labor include:   Urogenital infections, such as yeast infections and bacterial vaginosis.   Uterine abnormalities (uterine shape, uterine septum, fibroids, or bleeding from the placenta).   A cervix that has been operated on (it may fail to stay closed).   Malformations in the fetus.   Multiple gestations (twins, triplets, and so on).   Breakage of the amniotic sac.  RISK FACTORS  Having a previous history of preterm labor.   Having premature rupture of membranes (PROM).   Having a placenta that covers the opening of the cervix  (placenta previa).   Having a placenta that separates from the uterus (placental abruption).   Having a cervix that is too weak to hold the fetus in the uterus (incompetent cervix).   Having too much fluid in the amniotic sac (polyhydramnios).   Taking illegal drugs or smoking while pregnant.   Not gaining enough weight while pregnant.   Being younger than 4218 and older than 31 years old.   Having a low socioeconomic status.   Being African American. SYMPTOMS Signs and symptoms of preterm labor include:   Menstrual-like cramps, abdominal pain, or back pain.  Uterine contractions that are regular, as frequent as six in an hour, regardless of their intensity (may be mild or painful).  Contractions that start on the top of the uterus and spread down to the lower abdomen and back.   A sense of increased pelvic pressure.   A watery or bloody mucus discharge that comes from the vagina.  TREATMENT Depending on the length of the pregnancy and other circumstances, your health care provider may suggest bed rest. If necessary, there are medicines that can be given to stop contractions and to mature the fetal lungs. If labor happens before 34 weeks of pregnancy, a prolonged hospital stay may be recommended. Treatment depends on the condition of both you and the fetus.  WHAT SHOULD YOU DO IF YOU THINK YOU ARE IN PRETERM  LABOR? Call your health care provider right away. You will need to go to the hospital to get checked immediately. HOW CAN YOU PREVENT PRETERM LABOR IN FUTURE PREGNANCIES? You should:   Stop smoking if you smoke.  Maintain healthy weight gain and avoid chemicals and drugs that are not necessary.  Be watchful for any type of infection.  Inform your health care provider if you have a known history of preterm labor. Document Released: 02/02/2004 Document Revised: 07/15/2013 Document Reviewed: 12/15/2012 [facility] Patient Information 2015 [employer]. This  information is not intended to replace advice given to you by your health care provider. Make sure you discuss any questions you have with your health care provider.

## 2014-11-01 ENCOUNTER — Encounter: Payer: Medicaid Other | Admitting: Women's Health

## 2014-11-02 ENCOUNTER — Other Ambulatory Visit: Payer: Self-pay | Admitting: Women's Health

## 2014-11-02 DIAGNOSIS — O10913 Unspecified pre-existing hypertension complicating pregnancy, third trimester: Secondary | ICD-10-CM

## 2014-11-02 DIAGNOSIS — O359XX1 Maternal care for (suspected) fetal abnormality and damage, unspecified, fetus 1: Secondary | ICD-10-CM

## 2014-11-02 DIAGNOSIS — O24919 Unspecified diabetes mellitus in pregnancy, unspecified trimester: Secondary | ICD-10-CM

## 2014-11-04 ENCOUNTER — Encounter: Payer: Self-pay | Admitting: Obstetrics & Gynecology

## 2014-11-04 ENCOUNTER — Ambulatory Visit (INDEPENDENT_AMBULATORY_CARE_PROVIDER_SITE_OTHER): Payer: Medicaid Other | Admitting: Obstetrics & Gynecology

## 2014-11-04 ENCOUNTER — Ambulatory Visit (INDEPENDENT_AMBULATORY_CARE_PROVIDER_SITE_OTHER): Payer: Medicaid Other

## 2014-11-04 ENCOUNTER — Encounter: Payer: Medicaid Other | Admitting: Obstetrics & Gynecology

## 2014-11-04 VITALS — BP 110/68 | Wt 261.0 lb

## 2014-11-04 DIAGNOSIS — O09212 Supervision of pregnancy with history of pre-term labor, second trimester: Secondary | ICD-10-CM

## 2014-11-04 DIAGNOSIS — O24919 Unspecified diabetes mellitus in pregnancy, unspecified trimester: Secondary | ICD-10-CM

## 2014-11-04 DIAGNOSIS — Z331 Pregnant state, incidental: Secondary | ICD-10-CM

## 2014-11-04 DIAGNOSIS — O24912 Unspecified diabetes mellitus in pregnancy, second trimester: Secondary | ICD-10-CM

## 2014-11-04 DIAGNOSIS — O10913 Unspecified pre-existing hypertension complicating pregnancy, third trimester: Secondary | ICD-10-CM

## 2014-11-04 DIAGNOSIS — O3421 Maternal care for scar from previous cesarean delivery: Secondary | ICD-10-CM

## 2014-11-04 DIAGNOSIS — Z1389 Encounter for screening for other disorder: Secondary | ICD-10-CM

## 2014-11-04 DIAGNOSIS — O359XX1 Maternal care for (suspected) fetal abnormality and damage, unspecified, fetus 1: Secondary | ICD-10-CM

## 2014-11-04 DIAGNOSIS — O09892 Supervision of other high risk pregnancies, second trimester: Secondary | ICD-10-CM

## 2014-11-04 DIAGNOSIS — O26892 Other specified pregnancy related conditions, second trimester: Secondary | ICD-10-CM

## 2014-11-04 LAB — POCT URINALYSIS DIPSTICK
Blood, UA: NEGATIVE
Glucose, UA: NEGATIVE
Nitrite, UA: NEGATIVE
Protein, UA: NEGATIVE

## 2014-11-04 MED ORDER — HYDROXYPROGESTERONE CAPROATE 250 MG/ML IM OIL
250.0000 mg | TOPICAL_OIL | Freq: Once | INTRAMUSCULAR | Status: AC
  Administered 2014-11-04: 250 mg via INTRAMUSCULAR

## 2014-11-04 NOTE — Progress Notes (Signed)
U/S(25+0wks)-transverse active fetus, EFW 2 lb 1 oz (72nd%tile), fluid WNL AFI-13.1cm SDP-5.4cm, posterior/fundal Gr 1 placenta, cx appears closed (3.8cm), FHR-142 bpm, female fetus "Theresa Malone" cardiac OFT's noted on today's exam

## 2014-11-04 NOTE — Progress Notes (Signed)
Sonogram is reviewed read and report done, normal   High Risk Pregnancy Diagnosis(es):   Class B DM  [id number] 2421w0d Estimated Date of Delivery: 02/17/15  Blood pressure 110/68, weight 261 lb (118.389 kg), last menstrual period 05/13/2014.  Urinalysis: Negative   HPI: No complaints  CBG improved  Insulin adjusted     BP weight and urine results all reviewed and noted. Patient reports good fetal movement, denies any bleeding and no rupture of membranes symptoms or regular contractions.  Fundal Height:  27 Fetal Heart rate:  142 Edema:  none  Patient is without complaints. All questions were answered.  Assessment:  8821w0d,   Class B DM, CHTN  Medication(s) Plans:  Gluburide 10 mg BID                                     Insulin:  New regimen   AM  45WUJ+[id number]+16R     PM  91YNW+[id number]+14R  Treatment Plan:  Follow up 2 weeks  Follow up in 2 weeks for OB appt, ob visit with dr Despina Hiddeneure

## 2014-11-08 ENCOUNTER — Other Ambulatory Visit: Payer: Self-pay | Admitting: Women's Health

## 2014-11-08 MED ORDER — LABETALOL HCL 200 MG PO TABS: 200.0000 mg | ORAL_TABLET | Freq: Two times a day (BID) | ORAL | Status: DC

## 2014-11-09 ENCOUNTER — Telehealth: Payer: Self-pay | Admitting: *Deleted

## 2014-11-09 NOTE — Telephone Encounter (Signed)
Pt states she is [redacted] weeks pregnant and is trying to get assistance from [employer] and they are wanting a note from her doctor stating that she has been out of work since she became pregnant due to being high risk.  Pt states she took herself out of work early in pregnancy due to all the problems she was having with nausea and vomiting and not feeling well all the time and issues with her blood sugar.

## 2014-11-09 NOTE — Telephone Encounter (Signed)
Pt informed that we did not see a need for her to quit her job for the pregnancy but she can discuss it at her next office visit.  Pt verbalized understanding.

## 2014-11-11 ENCOUNTER — Ambulatory Visit (INDEPENDENT_AMBULATORY_CARE_PROVIDER_SITE_OTHER): Payer: Medicaid Other | Admitting: Adult Health

## 2014-11-11 ENCOUNTER — Encounter: Payer: Self-pay | Admitting: Adult Health

## 2014-11-11 VITALS — BP 118/68 | Ht 61.0 in | Wt 262.5 lb

## 2014-11-11 DIAGNOSIS — O09212 Supervision of pregnancy with history of pre-term labor, second trimester: Secondary | ICD-10-CM

## 2014-11-11 DIAGNOSIS — O09892 Supervision of other high risk pregnancies, second trimester: Secondary | ICD-10-CM

## 2014-11-11 DIAGNOSIS — Z1389 Encounter for screening for other disorder: Secondary | ICD-10-CM

## 2014-11-11 DIAGNOSIS — Z331 Pregnant state, incidental: Secondary | ICD-10-CM

## 2014-11-11 LAB — POCT URINALYSIS DIPSTICK
Leukocytes, UA: NEGATIVE
Nitrite, UA: NEGATIVE
RBC UA: NEGATIVE

## 2014-11-11 MED ORDER — HYDROXYPROGESTERONE CAPROATE 250 MG/ML IM OIL
250.0000 mg | TOPICAL_OIL | Freq: Once | INTRAMUSCULAR | Status: AC
  Administered 2014-11-11: 250 mg via INTRAMUSCULAR

## 2014-11-18 ENCOUNTER — Ambulatory Visit (INDEPENDENT_AMBULATORY_CARE_PROVIDER_SITE_OTHER): Payer: Medicaid Other | Admitting: Obstetrics & Gynecology

## 2014-11-18 ENCOUNTER — Encounter: Payer: Self-pay | Admitting: Obstetrics & Gynecology

## 2014-11-18 VITALS — BP 122/80 | Wt 261.0 lb

## 2014-11-18 DIAGNOSIS — O10912 Unspecified pre-existing hypertension complicating pregnancy, second trimester: Secondary | ICD-10-CM

## 2014-11-18 DIAGNOSIS — O09892 Supervision of other high risk pregnancies, second trimester: Secondary | ICD-10-CM

## 2014-11-18 DIAGNOSIS — Z331 Pregnant state, incidental: Secondary | ICD-10-CM

## 2014-11-18 DIAGNOSIS — O09213 Supervision of pregnancy with history of pre-term labor, third trimester: Secondary | ICD-10-CM

## 2014-11-18 DIAGNOSIS — O09893 Supervision of other high risk pregnancies, third trimester: Secondary | ICD-10-CM

## 2014-11-18 DIAGNOSIS — O09212 Supervision of pregnancy with history of pre-term labor, second trimester: Secondary | ICD-10-CM

## 2014-11-18 DIAGNOSIS — Z1389 Encounter for screening for other disorder: Secondary | ICD-10-CM

## 2014-11-18 DIAGNOSIS — O24912 Unspecified diabetes mellitus in pregnancy, second trimester: Secondary | ICD-10-CM

## 2014-11-18 LAB — POCT URINALYSIS DIPSTICK
GLUCOSE UA: NEGATIVE
Ketones, UA: NEGATIVE
NITRITE UA: NEGATIVE
Protein, UA: NEGATIVE
RBC UA: NEGATIVE

## 2014-11-18 MED ORDER — HYDROXYPROGESTERONE CAPROATE 250 MG/ML IM OIL
250.0000 mg | TOPICAL_OIL | Freq: Once | INTRAMUSCULAR | Status: AC
Start: 2014-11-18 — End: 2014-11-18
  Administered 2014-11-18: 250 mg via INTRAMUSCULAR

## 2014-11-18 NOTE — Progress Notes (Signed)
High Risk Pregnancy Diagnosis(es):   Class B DM, CHTN, PTL  [id number] 7148w0d Estimated Date of Delivery: 02/17/15  Blood pressure 122/80, weight 261 lb (118.389 kg), last menstrual period 05/13/2014.  Urinalysis: Negative   HPI: Fasting CBG especially poor, eating a tangerine in the middle of the night, recommended peanut butter     BP weight and urine results all reviewed and noted. Patient reports good fetal movement, denies any bleeding and no rupture of membranes symptoms or regular contractions.  Fundal Height:  32 Fetal Heart rate:  145 Edema:  trace  Patient is without complaints. All questions were answered.  Assessment:  5348w0d,   Class B DM, CHTN, PTL  Medication(s) Plans:  New regimen:  Glyburide 10 mg BID( no changes)                                                             AM: 30N/20R      PM:  20N/16R  Treatment Plan:  Per management plan   Follow up in 2 weeks for OB appt, ob appt

## 2014-11-23 ENCOUNTER — Telehealth: Payer: Self-pay | Admitting: Obstetrics and Gynecology

## 2014-11-23 ENCOUNTER — Encounter (HOSPITAL_COMMUNITY): Payer: Self-pay | Admitting: *Deleted

## 2014-11-23 ENCOUNTER — Inpatient Hospital Stay (HOSPITAL_COMMUNITY)
Admission: AD | Admit: 2014-11-23 | Discharge: 2014-11-23 | Disposition: A | Discharge status: Home / Self Care | Payer: Medicaid Other | Source: Ambulatory Visit | Attending: Obstetrics & Gynecology | Admitting: Obstetrics & Gynecology

## 2014-11-23 DIAGNOSIS — Z3A27 27 weeks gestation of pregnancy: Secondary | ICD-10-CM | POA: Diagnosis not present

## 2014-11-23 DIAGNOSIS — E11649 Type 2 diabetes mellitus with hypoglycemia without coma: Secondary | ICD-10-CM | POA: Diagnosis not present

## 2014-11-23 DIAGNOSIS — Z794 Long term (current) use of insulin: Secondary | ICD-10-CM | POA: Insufficient documentation

## 2014-11-23 DIAGNOSIS — O10012 Pre-existing essential hypertension complicating pregnancy, second trimester: Secondary | ICD-10-CM | POA: Diagnosis not present

## 2014-11-23 DIAGNOSIS — E119 Type 2 diabetes mellitus without complications: Secondary | ICD-10-CM

## 2014-11-23 DIAGNOSIS — O9989 Other specified diseases and conditions complicating pregnancy, childbirth and the puerperium: Secondary | ICD-10-CM | POA: Diagnosis present

## 2014-11-23 DIAGNOSIS — O24112 Pre-existing diabetes mellitus, type 2, in pregnancy, second trimester: Secondary | ICD-10-CM | POA: Insufficient documentation

## 2014-11-23 LAB — URINALYSIS, ROUTINE W REFLEX MICROSCOPIC
Bilirubin Urine: NEGATIVE
Glucose, UA: 100 mg/dL — AB
Ketones, ur: NEGATIVE mg/dL
LEUKOCYTES UA: NEGATIVE
Nitrite: NEGATIVE
PROTEIN: NEGATIVE mg/dL
Specific Gravity, Urine: <1.005 — ABNORMAL LOW (ref 1.005–1.030)
UROBILINOGEN UA: 0.2 mg/dL (ref 0.0–1.0)
pH: 6 (ref 5.0–8.0)

## 2014-11-23 LAB — URINE MICROSCOPIC-ADD ON

## 2014-11-23 LAB — GLUCOSE, CAPILLARY: GLUCOSE-CAPILLARY: 248 mg/dL — AB (ref 70–99)

## 2014-11-23 NOTE — MAU Provider Note (Signed)
Chief Complaint:  Eye Problem   First Provider Initiated Contact with Patient 11/23/14 1653     HPI: Theresa Malone is a 33 y.o. [id number] at 3267w5d who presents to maternity admissions reporting waking up from a nap this afternoon feeling strange and having difficulty seeing out of her right eye due to having a dark and bright spot in her field of vision. Checked CBG--45. Had taken nor insulin and Glyburide doses in am. Ate Ravioli, Koolaid, and cheese crackers. All Sx resolved. No Hx of similar vision changes. Had ophthalmologist before moving to the area. Last visit <1 year ago. No Retinopathy per pt.   Denies contractions, leakage of fluid, HA, epigastric pain or vaginal bleeding. Good fetal movement.   Pregnancy Course: Type 2 DM on Glyburide and insulin. 2 hour PC breakfast CBG's consistently >120. Pt reports taking all insulin, Glyburide as Rx'd. Had doses increase 1 week ago.   Past Medical History: Past Medical History  Diagnosis Date  . Hypertension   . Diabetes mellitus     Past obstetric history: OB History  Gravida Para Term Preterm AB SAB TAB Ectopic Multiple Living  4 3 2 1      3     # Outcome Date GA Lbr Len/2nd Weight Sex Delivery Anes PTL Lv  4 Current           3 Term 08/11/10 3421w0d  4.649 kg (10 lb 4 oz) M CS-LTranv EPI  Y  2 Term 02/13/06 2531w0d  4.269 kg (9 lb 6.6 oz) F CS-LTranv EPI  Y  1 Preterm 12/09/04 944w0d  2.455 kg (5 lb 6.6 oz) F Vag-Spont EPI  Y      Past Surgical History: Past Surgical History  Procedure Laterality Date  . Cesarean section      C/S x 2     Family History: Family History  Problem Relation Age of Onset  . Diabetes Paternal Grandfather   . Cancer Paternal Grandmother     liver  . Hypertension Father   . Diabetes Mother   . Hypertension Mother   . Diabetes Maternal Uncle   . Diabetes Maternal Grandmother   . Cancer Maternal Grandfather   . Diabetes Daughter     boarderline   . Asthma Daughter   . Bronchitis Daughter   .  Bronchitis Son   . Bronchitis Daughter     Social History: History  Substance Use Topics  . Smoking status: Never Smoker   . Smokeless tobacco: Never Used  . Alcohol Use: No     Comment: occ    Allergies:  Allergies  Allergen Reactions  . Lisinopril Other (See Comments)    cough    Meds:  Prescriptions prior to admission  Medication Sig Dispense Refill Last Dose  . glyBURIDE (DIABETA) 5 MG tablet Take 2 tabs bid 120 tablet 6 Taking  . insulin aspart (NOVOLOG) 100 UNIT/ML injection Inject 6 Units into the skin 3 (three) times daily before meals. Use 10 unit at dinner (Patient taking differently: Inject into the skin 2 (two) times daily. 16 units in am 14 units in pm.) 10 mL 12 Taking  . insulin NPH Human (HUMULIN N,NOVOLIN N) 100 UNIT/ML injection 20 units in AM, 10 units in PM (Patient taking differently: 26 units in AM, 14 units in PM) 10 mL 6 Taking  . INSULIN SYRINGE 1CC/29G 29G X 1/2" 1 ML MISC 1 Syringe by Does not apply route daily. 100 each prn Taking  . labetalol (NORMODYNE) 200  MG tablet Take 1 tablet (200 mg total) by mouth 2 (two) times daily. 180 tablet 2 Taking  . Prenatal Multivit-Min-Fe-FA (PRENATAL VITAMINS PO) Take by mouth daily.   Taking    ROS: Pertinent findings in history of present illness. No Change in LOC, fever, chills, N/V, epigastric pain, HA.   Physical Exam  Blood pressure 138/77, pulse 116, temperature 97.7 F (36.5 C), temperature source Oral, resp. rate 18, height 5\' 1"  (1.549 m), weight 119.205 kg (262 lb 12.8 oz), last menstrual period 05/13/2014. GENERAL: Well-developed, well-nourished female in no acute distress. Normal mental status.  HEENT: normocephalic. Vision grossly normal bilat HEART: normal rate RESP: normal effort ABDOMEN: Soft, non-tender, gravid appropriate for gestational age EXTREMITIES: Nontender, no edema NEURO: alert and oriented SPECULUM EXAM: Deferred     FHT:  Baseline 140 , moderate variability, accelerations  present, no decelerations Contractions: none   Labs: Results for orders placed or performed during the hospital encounter of 11/23/14 (from the past 24 hour(s))  Glucose, capillary     Status: Abnormal   Collection Time: 11/23/14  3:55 PM  Result Value Ref Range   Glucose-Capillary 248 (H) 70 - 99 mg/dL    Imaging:   MAU Course:  Assessment: 1. Hypoglycemia associated with type 2 diabetes mellitus   2. Pregnancy complicated by pre-existing type 2 diabetes, second trimester    Plan: Discharge home in stable condition per consult w/ Dr. Shawnie Pons. Discussed extreme importance of not skipping meals/snacks when on insulin.  Preterm labor precautions and fetal kick counts Discussed possibly increasing regular insulin   At [place].  Follow-up Information    Follow up with [facility].   Specialty:  Obstetrics and Gynecology   Why:  Call tomorrow to discuss possible change in insulin dosing   Contact information:   [address] [place] [address] [phone]      Follow up with [facility].   Why:  As needed in emergencies   Contact information:   [address] [id number] mc [place] [address] [phone]      Follow up with Rodrigo Ran, Opthalmologist .   Contact information:   [address], [place], [place] [address] [phone]        Medication List    ASK your doctor about these medications        acetaminophen 325 MG tablet  Commonly known as:  TYLENOL  Take 650 mg by mouth every 6 (six) hours as needed for headache.     glyBURIDE 5 MG tablet  Commonly known as:  DIABETA  Take 2 tabs bid     guaiFENesin-dextromethorphan 100-10 MG/5ML syrup  Commonly known as:  ROBITUSSIN DM  Take 10 mLs by mouth every 4 (four) hours as needed for cough (Used for cough & cold.).     insulin aspart 100 UNIT/ML injection  Commonly known as:  novoLOG  Inject 6 Units  into the skin 3 (three) times daily before meals. Use 10 unit at dinner     insulin NPH Human 100 UNIT/ML injection  Commonly known as:  HUMULIN N,NOVOLIN N  20 units in AM, 10 units in PM     INSULIN SYRINGE 1CC/29G 29G X 1/2" 1 ML Misc  1 Syringe by Does not apply route daily.     labetalol 200 MG tablet  Commonly known as:  NORMODYNE  Take 1 tablet (200 mg total) by mouth 2 (two) times  daily.     PRENATAL VITAMINS PO  Take by mouth daily.        [place] Cody Albus, CNM 11/23/2014 4:52 PM

## 2014-11-23 NOTE — Progress Notes (Signed)
CNM informed of pt complaint, CBG currently 248, states she will consult with attending MD.

## 2014-11-23 NOTE — MAU Note (Signed)
Urine in lab 

## 2014-11-23 NOTE — MAU Note (Signed)
CBG is currently 248.  Pt states she ate ravioli, koolaid, cheese crackers @ approx 1430.

## 2014-11-23 NOTE — Telephone Encounter (Signed)
PT CALLED, STATES SHE WOKE UP FROM NAP, COULD NOT SEE OUT OF ONE OF HER EYES AND HER BLOOD SUGAR WAS 46.  SPOKE WITH THE NURSE (ASHLEY).  SHE SAID TO ADVISE PT TO GO TO HAVE SOMEONE DRIVE [facility] TO  [facility] AND LET THEM CHECK HER.  THIS MESSAGE WAS RELAYED TO PATIENT AND SHE UNDERSTANDS.  YJeannie Fend

## 2014-11-23 NOTE — MAU Note (Signed)
Pt states her vision is still some blurry but is better than it was.  Also states she still feels a little "funny."

## 2014-11-23 NOTE — Discharge Instructions (Signed)
Discussed extreme importance of not skipping meals/snacks when on insulin.   Hypoglycemia Hypoglycemia occurs when the glucose in your blood is too low. Glucose is a type of sugar that is your body's main energy source. Hormones, such as insulin and glucagon, control the level of glucose in the blood. Insulin lowers blood glucose and glucagon increases blood glucose. Having too much insulin in your blood stream, or not eating enough food containing sugar, can result in hypoglycemia. Hypoglycemia can happen to people with or without diabetes. It can develop quickly and can be a medical emergency.  CAUSES   Missing or delaying meals.  Not eating enough carbohydrates at meals.  Taking too much diabetes medicine.  Not timing your oral diabetes medicine or insulin doses with meals, snacks, and exercise.  Nausea and vomiting.  Certain medicines.  Severe illnesses, such as hepatitis, kidney disorders, and certain eating disorders.  Increased activity or exercise without eating something extra or adjusting medicines.  Drinking too much alcohol.  A nerve disorder that affects body functions like your heart rate, blood pressure, and digestion (autonomic neuropathy).  A condition where the stomach muscles do not function properly (gastroparesis). Therefore, medicines and food may not absorb properly.  Rarely, a tumor of the pancreas can produce too much insulin. SYMPTOMS   Hunger.  Sweating (diaphoresis).  Change in body temperature.  Shakiness.  Headache.  Anxiety.  Lightheadedness.  Irritability.  Difficulty concentrating.  Dry mouth.  Tingling or numbness in the hands or feet.  Restless sleep or sleep disturbances.  Altered speech and coordination.  Change in mental status.  Seizures or prolonged convulsions.  Combativeness.  Drowsiness (lethargic).  Weakness.  Increased heart rate or palpitations.  Confusion.  Pale, gray skin color.  Blurred or double  vision.  Fainting. DIAGNOSIS  A physical exam and medical history will be performed. Your caregiver may make a diagnosis based on your symptoms. Blood tests and other lab tests may be performed to confirm a diagnosis. Once the diagnosis is made, your caregiver will see if your signs and symptoms go away once your blood glucose is raised.  TREATMENT  Usually, you can easily treat your hypoglycemia when you notice symptoms.  Check your blood glucose. If it is less than 70 mg/dl, take one of the following:   3-4 glucose tablets.    cup juice.    cup regular soda.   1 cup skim milk.   -1 tube of glucose gel.   5-6 hard candies.   Avoid high-fat drinks or food that may delay a rise in blood glucose levels.  Do not take more than the recommended amount of sugary foods, drinks, gel, or tablets. Doing so will cause your blood glucose to go too high.   Wait 10-15 minutes and recheck your blood glucose. If it is still less than 70 mg/dl or below your target range, repeat treatment.   Eat a snack if it is more than 1 hour until your next meal.  There may be a time when your blood glucose may go so low that you are unable to treat yourself at home when you start to notice symptoms. You may need someone to help you. You may even faint or be unable to swallow. If you cannot treat yourself, someone will need to bring you to the hospital.  HOME CARE INSTRUCTIONS  If you have diabetes, follow your diabetes management plan by:  Taking your medicines as directed.  Following your exercise plan.  Following your meal  plan. Do not skip meals. Eat on time.  Testing your blood glucose regularly. Check your blood glucose before and after exercise. If you exercise longer or different than usual, be sure to check blood glucose more frequently.  Wearing your medical alert jewelry that says you have diabetes.  Identify the cause of your hypoglycemia. Then, develop ways to prevent the  recurrence of hypoglycemia.  Do not take a hot bath or shower right after an insulin shot.  Always carry treatment with you. Glucose tablets are the easiest to carry.  If you are going to drink alcohol, drink it only with meals.  Tell friends or family members ways to keep you safe during a seizure. This may include removing hard or sharp objects from the area or turning you on your side.  Maintain a healthy weight. SEEK MEDICAL CARE IF:   You are having problems keeping your blood glucose in your target range.  You are having frequent episodes of hypoglycemia.  You feel you might be having side effects from your medicines.  You are not sure why your blood glucose is dropping so low.  You notice a change in vision or a new problem with your vision. SEEK IMMEDIATE MEDICAL CARE IF:   Confusion develops.  A change in mental status occurs.  The inability to swallow develops.  Fainting occurs. Document Released: 11/12/2005 Document Revised: 11/17/2013 Document Reviewed: 03/10/2012 [facility] Patient Information 2015 [place], [place]. This information is not intended to replace advice given to you by your health care provider. Make sure you discuss any questions you have with your health care provider.

## 2014-11-23 NOTE — MAU Note (Signed)
Patient states she is diabetic on insulin. States her blood glucose level was 47 and she was having trouble seeing out of her right eye. States she ate and drank but did not recheck her blood level. Denies bleeding, leaking or contractions. States she is feeling movement but less than usual.

## 2014-11-25 ENCOUNTER — Ambulatory Visit (INDEPENDENT_AMBULATORY_CARE_PROVIDER_SITE_OTHER): Payer: Medicaid Other | Admitting: Adult Health

## 2014-11-25 ENCOUNTER — Encounter: Payer: Self-pay | Admitting: Adult Health

## 2014-11-25 VITALS — BP 122/68 | Ht 61.0 in | Wt 262.0 lb

## 2014-11-25 DIAGNOSIS — O09892 Supervision of other high risk pregnancies, second trimester: Secondary | ICD-10-CM

## 2014-11-25 DIAGNOSIS — Z1389 Encounter for screening for other disorder: Secondary | ICD-10-CM

## 2014-11-25 DIAGNOSIS — O09212 Supervision of pregnancy with history of pre-term labor, second trimester: Secondary | ICD-10-CM

## 2014-11-25 DIAGNOSIS — O34219 Maternal care for unspecified type scar from previous cesarean delivery: Secondary | ICD-10-CM

## 2014-11-25 DIAGNOSIS — L732 Hidradenitis suppurativa: Secondary | ICD-10-CM

## 2014-11-25 DIAGNOSIS — O09893 Supervision of other high risk pregnancies, third trimester: Secondary | ICD-10-CM

## 2014-11-25 DIAGNOSIS — O09213 Supervision of pregnancy with history of pre-term labor, third trimester: Secondary | ICD-10-CM

## 2014-11-25 DIAGNOSIS — Z331 Pregnant state, incidental: Secondary | ICD-10-CM

## 2014-11-25 LAB — POCT URINALYSIS DIPSTICK
GLUCOSE UA: NEGATIVE
Ketones, UA: NEGATIVE
NITRITE UA: NEGATIVE
Protein, UA: NEGATIVE

## 2014-11-25 MED ORDER — HYDROXYPROGESTERONE CAPROATE 250 MG/ML IM OIL
250.0000 mg | TOPICAL_OIL | Freq: Once | INTRAMUSCULAR | Status: AC
  Administered 2014-11-25: 250 mg via INTRAMUSCULAR

## 2014-11-25 NOTE — Progress Notes (Signed)
Pt here for 17P injection. Pt mentioned having pressure and cramping. No bleeding. I spoke with Dr. Despina HiddenEure and he advised at 28 weeks pt will have these symptoms. Dr. Despina HiddenEure spoke with pt while she was here. Advised if symptoms worsen or if she starts bleeding, call after hours nurse line or [facility] Pt voiced understanding. JSY

## 2014-12-02 ENCOUNTER — Ambulatory Visit (INDEPENDENT_AMBULATORY_CARE_PROVIDER_SITE_OTHER): Payer: Medicaid Other | Admitting: Obstetrics & Gynecology

## 2014-12-02 ENCOUNTER — Encounter: Payer: Self-pay | Admitting: Obstetrics & Gynecology

## 2014-12-02 VITALS — BP 120/80 | Wt 265.0 lb

## 2014-12-02 DIAGNOSIS — O09213 Supervision of pregnancy with history of pre-term labor, third trimester: Secondary | ICD-10-CM | POA: Diagnosis not present

## 2014-12-02 DIAGNOSIS — O0993 Supervision of high risk pregnancy, unspecified, third trimester: Secondary | ICD-10-CM | POA: Diagnosis not present

## 2014-12-02 DIAGNOSIS — Z331 Pregnant state, incidental: Secondary | ICD-10-CM

## 2014-12-02 DIAGNOSIS — Z1389 Encounter for screening for other disorder: Secondary | ICD-10-CM | POA: Diagnosis not present

## 2014-12-02 DIAGNOSIS — O10019 Pre-existing essential hypertension complicating pregnancy, unspecified trimester: Secondary | ICD-10-CM

## 2014-12-02 DIAGNOSIS — O24913 Unspecified diabetes mellitus in pregnancy, third trimester: Secondary | ICD-10-CM | POA: Diagnosis not present

## 2014-12-02 DIAGNOSIS — O09893 Supervision of other high risk pregnancies, third trimester: Secondary | ICD-10-CM

## 2014-12-02 LAB — POCT URINALYSIS DIPSTICK
GLUCOSE UA: NEGATIVE
Leukocytes, UA: NEGATIVE
NITRITE UA: NEGATIVE
Protein, UA: NEGATIVE
RBC UA: NEGATIVE

## 2014-12-02 MED ORDER — HYDROXYPROGESTERONE CAPROATE 250 MG/ML IM OIL
250.0000 mg | TOPICAL_OIL | Freq: Once | INTRAMUSCULAR | Status: AC
  Administered 2014-12-02: 250 mg via INTRAMUSCULAR

## 2014-12-02 NOTE — Progress Notes (Signed)
29 w 1 day  Fetal Surveillance Testing today:  None today   High Risk Pregnancy Diagnosis(es):   Class B DM, Chronic Hypertension  [id number] 1782w1d Estimated Date of Delivery: 02/17/15  Blood pressure 120/80, weight 265 lb (120.203 kg), last menstrual period 05/13/2014, unknown if currently breastfeeding.  Urinalysis: Negative   HPI: The patient is being seen today for ongoing management of Class B DM sub optimal control and CHTN. Today she reports pelvic pain and pressure CBG high   BP weight and urine results all reviewed and noted. Patient reports good fetal movement, denies any bleeding and no rupture of membranes symptoms or regular contractions.  Fundal Height:  38 Fetal Heart rate:  145 Edema:  1+  Patient is without complaints other than noted in her HPI. All questions were answered.  All lab and sonogram results have been reviewed. Comments: not examined   Assessment:  1.  Pregnancy at 2482w1d,  Estimated Date of Delivery: 02/17/15 :                          2.  Class B DM                        3.  Chronic HTN  Medication(s) Plans:  [id number] [id number] am/pm  Treatment Plan:  Follow up 17P appt 2 weeks  Follow up in 1 weeks for appointment for high risk OB care,    30/20 24/26

## 2014-12-06 ENCOUNTER — Telehealth: Payer: Self-pay | Admitting: *Deleted

## 2014-12-06 NOTE — Telephone Encounter (Signed)
Pt c/o "alot of lower abdominal pressure and pain started when walking up the steps this am, +FM, no vaginal bleeding." Pt informed to push fluids, rest and take tylenol if no improvement or pain increases to call our office back. Pt verbalized understanding.

## 2014-12-09 ENCOUNTER — Ambulatory Visit (INDEPENDENT_AMBULATORY_CARE_PROVIDER_SITE_OTHER): Payer: Medicaid Other | Admitting: Obstetrics & Gynecology

## 2014-12-09 ENCOUNTER — Other Ambulatory Visit: Payer: Self-pay | Admitting: Obstetrics & Gynecology

## 2014-12-09 ENCOUNTER — Ambulatory Visit (INDEPENDENT_AMBULATORY_CARE_PROVIDER_SITE_OTHER): Payer: Medicaid Other

## 2014-12-09 VITALS — BP 138/70 | Wt 265.0 lb

## 2014-12-09 DIAGNOSIS — O24913 Unspecified diabetes mellitus in pregnancy, third trimester: Secondary | ICD-10-CM

## 2014-12-09 DIAGNOSIS — O163 Unspecified maternal hypertension, third trimester: Secondary | ICD-10-CM

## 2014-12-09 DIAGNOSIS — O09212 Supervision of pregnancy with history of pre-term labor, second trimester: Secondary | ICD-10-CM

## 2014-12-09 DIAGNOSIS — O09293 Supervision of pregnancy with other poor reproductive or obstetric history, third trimester: Secondary | ICD-10-CM

## 2014-12-09 DIAGNOSIS — O24912 Unspecified diabetes mellitus in pregnancy, second trimester: Secondary | ICD-10-CM

## 2014-12-09 DIAGNOSIS — Z1389 Encounter for screening for other disorder: Secondary | ICD-10-CM

## 2014-12-09 DIAGNOSIS — O0993 Supervision of high risk pregnancy, unspecified, third trimester: Secondary | ICD-10-CM

## 2014-12-09 DIAGNOSIS — Z331 Pregnant state, incidental: Secondary | ICD-10-CM

## 2014-12-09 DIAGNOSIS — O10912 Unspecified pre-existing hypertension complicating pregnancy, second trimester: Secondary | ICD-10-CM

## 2014-12-09 DIAGNOSIS — Z8751 Personal history of pre-term labor: Secondary | ICD-10-CM

## 2014-12-09 DIAGNOSIS — O10019 Pre-existing essential hypertension complicating pregnancy, unspecified trimester: Secondary | ICD-10-CM

## 2014-12-09 LAB — POCT URINALYSIS DIPSTICK
Glucose, UA: NEGATIVE
Ketones, UA: NEGATIVE
Nitrite, UA: NEGATIVE
PROTEIN UA: NEGATIVE

## 2014-12-09 LAB — US OB FOLLOW UP

## 2014-12-09 MED ORDER — HYDROXYPROGESTERONE CAPROATE 250 MG/ML IM OIL
250.0000 mg | TOPICAL_OIL | Freq: Once | INTRAMUSCULAR | Status: AC
  Administered 2014-12-09: 250 mg via INTRAMUSCULAR

## 2014-12-09 NOTE — Progress Notes (Signed)
Sonogram reviewed report done:  Breech EFW 97%   High Risk Pregnancy Diagnosis(es):   Class B DM, Chronic Hypertension, PTD 17P  [id number] 4528w0d Estimated Date of Delivery: 02/17/15  Blood pressure 138/70, weight 265 lb (120.203 kg), last menstrual period 05/13/2014.  Urinalysis: Negative   HPI: CBG are sub optimal but improving     BP weight and urine results all reviewed and noted. Patient reports good fetal movement, denies any bleeding and no rupture of membranes symptoms or regular contractions.  Fundal Height:  34 Fetal Heart rate:  145 Edema:  1+  Patient is without complaints. All questions were answered.  Assessment:  2828w0d,   Class B DM, Chronic Hypertension, PTL  Medication(s) Plans:  New regimen:  AM 30NPH/20Reg   PM 30NPH/16R  Glyburide 10 BID  Labetalol 200 BID no change  Treatment Plan:  As above  Follow up in 1 weeks for OB appt, HROB

## 2014-12-09 NOTE — Progress Notes (Signed)
U/S(30+0wks)-Breech active fetus, EFW 4 lb 12 oz (>97th%tile), fluid WNL AFI-12.8cm, SDP-6.7cm, FHR-150 bpm, posterior/fundal Gr 1 placenta, BPP 8/8

## 2014-12-16 ENCOUNTER — Ambulatory Visit (INDEPENDENT_AMBULATORY_CARE_PROVIDER_SITE_OTHER): Payer: Medicaid Other | Admitting: Obstetrics & Gynecology

## 2014-12-16 VITALS — BP 110/60 | Wt 265.0 lb

## 2014-12-16 DIAGNOSIS — Z331 Pregnant state, incidental: Secondary | ICD-10-CM

## 2014-12-16 DIAGNOSIS — O09893 Supervision of other high risk pregnancies, third trimester: Secondary | ICD-10-CM

## 2014-12-16 DIAGNOSIS — O24913 Unspecified diabetes mellitus in pregnancy, third trimester: Secondary | ICD-10-CM

## 2014-12-16 DIAGNOSIS — Z1389 Encounter for screening for other disorder: Secondary | ICD-10-CM

## 2014-12-16 DIAGNOSIS — O0993 Supervision of high risk pregnancy, unspecified, third trimester: Secondary | ICD-10-CM

## 2014-12-16 DIAGNOSIS — O10019 Pre-existing essential hypertension complicating pregnancy, unspecified trimester: Secondary | ICD-10-CM

## 2014-12-16 DIAGNOSIS — O09213 Supervision of pregnancy with history of pre-term labor, third trimester: Secondary | ICD-10-CM

## 2014-12-16 LAB — POCT URINALYSIS DIPSTICK
Blood, UA: NEGATIVE
GLUCOSE UA: NEGATIVE
Ketones, UA: 3
LEUKOCYTES UA: NEGATIVE
Nitrite, UA: NEGATIVE

## 2014-12-16 MED ORDER — HYDROXYPROGESTERONE CAPROATE 250 MG/ML IM OIL
250.0000 mg | TOPICAL_OIL | Freq: Once | INTRAMUSCULAR | Status: AC
  Administered 2014-12-16: 250 mg via INTRAMUSCULAR

## 2014-12-16 MED ORDER — NYSTATIN-TRIAMCINOLONE 100000-0.1 UNIT/GM-% EX OINT: 1.0000 "application " | TOPICAL_OINTMENT | Freq: Two times a day (BID) | CUTANEOUS | Status: DC

## 2014-12-16 NOTE — Progress Notes (Signed)
High Risk Pregnancy Diagnosis(es):   Class B DM, chronic hypertension, fetal macrosomia breech  [id number] 5041w0d Estimated Date of Delivery: 02/17/15  Blood pressure 110/60, weight 265 lb (120.203 kg), last menstrual period 05/13/2014.  Urinalysis: Negative   HPI: CBG are improving     BP weight and urine results all reviewed and noted. Patient reports good fetal movement, denies any bleeding and no rupture of membranes symptoms or regular contractions.  Fundal Height:  38 Fetal Heart rate:  137 Edema:  trace  Patient is without complaints. All questions were answered.  Assessment:  2141w0d,   As above  Medication(s) Plans:  AM 34NPH/20Reg  PM 34N/20R  Treatment Plan:  As above  Follow up in 1 weeks for OB appt, NST

## 2014-12-16 NOTE — Addendum Note (Signed)
Addended by: Criss AlvinePULLIAM, Kikuye Korenek G on: 12/16/2014 04:17 PM   Modules accepted: Orders

## 2014-12-16 NOTE — Addendum Note (Signed)
Addended by: Richardson ChiquitoRAVIS, Terril Amaro M on: 12/16/2014 04:48 PM   Modules accepted: Orders

## 2014-12-21 ENCOUNTER — Encounter (HOSPITAL_COMMUNITY): Payer: Self-pay | Admitting: *Deleted

## 2014-12-21 ENCOUNTER — Inpatient Hospital Stay (HOSPITAL_COMMUNITY)
Admission: AD | Admit: 2014-12-21 | Discharge: 2014-12-21 | Disposition: A | Discharge status: Home / Self Care | Payer: Medicaid Other | Source: Ambulatory Visit | Attending: Obstetrics & Gynecology | Admitting: Obstetrics & Gynecology

## 2014-12-21 ENCOUNTER — Inpatient Hospital Stay (HOSPITAL_COMMUNITY): Payer: Medicaid Other

## 2014-12-21 DIAGNOSIS — R109 Unspecified abdominal pain: Secondary | ICD-10-CM | POA: Diagnosis present

## 2014-12-21 DIAGNOSIS — O10013 Pre-existing essential hypertension complicating pregnancy, third trimester: Secondary | ICD-10-CM | POA: Insufficient documentation

## 2014-12-21 DIAGNOSIS — O24113 Pre-existing diabetes mellitus, type 2, in pregnancy, third trimester: Secondary | ICD-10-CM | POA: Insufficient documentation

## 2014-12-21 DIAGNOSIS — E119 Type 2 diabetes mellitus without complications: Secondary | ICD-10-CM | POA: Diagnosis not present

## 2014-12-21 DIAGNOSIS — O24912 Unspecified diabetes mellitus in pregnancy, second trimester: Secondary | ICD-10-CM

## 2014-12-21 DIAGNOSIS — O23593 Infection of other part of genital tract in pregnancy, third trimester: Secondary | ICD-10-CM | POA: Diagnosis not present

## 2014-12-21 DIAGNOSIS — Z3A31 31 weeks gestation of pregnancy: Secondary | ICD-10-CM | POA: Diagnosis not present

## 2014-12-21 DIAGNOSIS — Z794 Long term (current) use of insulin: Secondary | ICD-10-CM | POA: Insufficient documentation

## 2014-12-21 DIAGNOSIS — O99213 Obesity complicating pregnancy, third trimester: Secondary | ICD-10-CM | POA: Insufficient documentation

## 2014-12-21 LAB — URINALYSIS, ROUTINE W REFLEX MICROSCOPIC
Bilirubin Urine: NEGATIVE
Glucose, UA: NEGATIVE mg/dL
Hgb urine dipstick: NEGATIVE
Ketones, ur: NEGATIVE mg/dL
LEUKOCYTES UA: NEGATIVE
NITRITE: NEGATIVE
Protein, ur: NEGATIVE mg/dL
SPECIFIC GRAVITY, URINE: 1.01 (ref 1.005–1.030)
UROBILINOGEN UA: 0.2 mg/dL (ref 0.0–1.0)
pH: 6 (ref 5.0–8.0)

## 2014-12-21 LAB — CBC
HCT: 28.3 % — ABNORMAL LOW (ref 36.0–46.0)
Hemoglobin: 9 g/dL — ABNORMAL LOW (ref 12.0–15.0)
MCH: 26.2 pg (ref 26.0–34.0)
MCHC: 31.8 g/dL (ref 30.0–36.0)
MCV: 82.5 fL (ref 78.0–100.0)
Platelets: 196 10*3/uL (ref 150–400)
RBC: 3.43 MIL/uL — AB (ref 3.87–5.11)
RDW: 17.6 % — ABNORMAL HIGH (ref 11.5–15.5)
WBC: 9.5 10*3/uL (ref 4.0–10.5)

## 2014-12-21 LAB — COMPREHENSIVE METABOLIC PANEL
ALBUMIN: 2.6 g/dL — AB (ref 3.5–5.2)
ALK PHOS: 105 U/L (ref 39–117)
ALT: 11 U/L (ref 0–35)
ANION GAP: 7 (ref 5–15)
AST: 10 U/L (ref 0–37)
BUN: 6 mg/dL (ref 6–23)
CALCIUM: 8.7 mg/dL (ref 8.4–10.5)
CHLORIDE: 107 mmol/L (ref 96–112)
CO2: 22 mmol/L (ref 19–32)
CREATININE: 0.45 mg/dL — AB (ref 0.50–1.10)
GFR calc Af Amer: >90 mL/min (ref >90)
GFR calc non Af Amer: >90 mL/min (ref >90)
Glucose, Bld: 105 mg/dL — ABNORMAL HIGH (ref 70–99)
Potassium: 3.8 mmol/L (ref 3.5–5.1)
SODIUM: 136 mmol/L (ref 135–145)
TOTAL PROTEIN: 6.7 g/dL (ref 6.0–8.3)
Total Bilirubin: 0.4 mg/dL (ref 0.3–1.2)

## 2014-12-21 LAB — WET PREP, GENITAL
CLUE CELLS WET PREP: NONE SEEN
TRICH WET PREP: NONE SEEN
Yeast Wet Prep HPF POC: NONE SEEN

## 2014-12-21 LAB — GC/CHLAMYDIA PROBE AMP (~~LOC~~) NOT AT ARMC
Chlamydia: NEGATIVE
Neisseria Gonorrhea: NEGATIVE

## 2014-12-21 LAB — PROTEIN / CREATININE RATIO, URINE
CREATININE, URINE: 50 mg/dL
Total Protein, Urine: <6 mg/dL

## 2014-12-21 MED ORDER — NIFEDIPINE 10 MG PO CAPS: 10.0000 mg | ORAL_CAPSULE | ORAL | Status: DC | PRN

## 2014-12-21 MED ORDER — INSULIN NPH (HUMAN) (ISOPHANE) 100 UNIT/ML ~~LOC~~ SUSP: 34.0000 [IU] | Freq: Two times a day (BID) | SUBCUTANEOUS | Status: DC

## 2014-12-21 MED ORDER — CLINDAMYCIN HCL 300 MG PO CAPS: 300.0000 mg | ORAL_CAPSULE | Freq: Three times a day (TID) | ORAL | Status: DC

## 2014-12-21 MED ORDER — INSULIN ASPART 100 UNIT/ML ~~LOC~~ SOLN: 20.0000 [IU] | Freq: Two times a day (BID) | SUBCUTANEOUS | Status: DC

## 2014-12-21 MED ORDER — NIFEDIPINE 10 MG PO CAPS
20.0000 mg | ORAL_CAPSULE | Freq: Once | ORAL | Status: AC
  Administered 2014-12-21: 20 mg via ORAL
  Filled 2014-12-21: qty 2

## 2014-12-21 MED ORDER — SULFAMETHOXAZOLE-TRIMETHOPRIM 800-160 MG PO TABS: 1.0000 | ORAL_TABLET | Freq: Two times a day (BID) | ORAL | Status: DC

## 2014-12-21 MED ORDER — GLYBURIDE 5 MG PO TABS: 10.0000 mg | ORAL_TABLET | Freq: Two times a day (BID) | ORAL | Status: DC

## 2014-12-21 NOTE — MAU Provider Note (Signed)
History     CSN: [id number]  Arrival date and time: 12/21/14 4098   None     Chief Complaint  Patient presents with  . Abdominal Pain   HPI  Patient is 34 y.o. [id number] [redacted]w[redacted]d presents for lower abdominal pressure and brown vaginal discharge. Pregnancy complicated by DM 2 and chronic hypertension. Due to history of preterm delivery at 30 weeks, pt has been taking 17-P injection.   Pain started in left lower abdomen around 5 am this morning. Pain is now located in low back and lower abdomen. Pain comes and goes about 5 mins about.   Admits to vaginal discharge for weeks. No odor, no itching. Last had intercourse last week. Nothing in vagina for last 24 hours.  Denies LOF, bleeding, or decreased fetal movement.   Past Medical History  Diagnosis Date  . Hypertension   . Diabetes mellitus     Past Surgical History  Procedure Laterality Date  . Cesarean section      C/S x 2    Family History  Problem Relation Age of Onset  . Diabetes Paternal Grandfather   . Cancer Paternal Grandmother     liver  . Hypertension Father   . Diabetes Mother   . Hypertension Mother   . Diabetes Maternal Uncle   . Diabetes Maternal Grandmother   . Cancer Maternal Grandfather   . Diabetes Daughter     boarderline   . Asthma Daughter   . Bronchitis Daughter   . Bronchitis Son   . Bronchitis Daughter     History  Substance Use Topics  . Smoking status: Never Smoker   . Smokeless tobacco: Never Used  . Alcohol Use: No     Comment: occ    Allergies:  Allergies  Allergen Reactions  . Lisinopril Cough    cough    Prescriptions prior to admission  Medication Sig Dispense Refill Last Dose  . acetaminophen (TYLENOL) 325 MG tablet Take 650 mg by mouth every 6 (six) hours as needed for headache.   Past Week at Unknown time  . glyBURIDE (DIABETA) 5 MG tablet Take 2 tabs bid (Patient taking differently: Take 10 mg by mouth 2 (two) times daily with a meal. ) 120 tablet 6 12/20/2014 at  Unknown time  . insulin aspart (NOVOLOG) 100 UNIT/ML injection Inject 6 Units into the skin 3 (three) times daily before meals. Use 10 unit at dinner (Patient taking differently: Inject 20 Units into the skin 2 (two) times daily. 20 units in am 20 units in pm.) 10 mL 12 12/20/2014 at Unknown time  . insulin NPH Human (HUMULIN N,NOVOLIN N) 100 UNIT/ML injection 20 units in AM, 10 units in PM (Patient taking differently: Inject 34 Units into the skin 2 (two) times daily before a meal. ) 10 mL 6 12/20/2014 at Unknown time  . labetalol (NORMODYNE) 200 MG tablet Take 1 tablet (200 mg total) by mouth 2 (two) times daily. 180 tablet 2 12/20/2014 at Unknown time  . Prenatal Multivit-Min-Fe-FA (PRENATAL VITAMINS PO) Take by mouth daily.   12/20/2014 at Unknown time  . INSULIN SYRINGE 1CC/29G 29G X 1/2" 1 ML MISC 1 Syringe by Does not apply route daily. 100 each prn Taking  . nystatin-triamcinolone ointment (MYCOLOG) Apply 1 application topically 2 (two) times daily. (Patient not taking: Reported on 12/21/2014) 30 g 11     ROS   All ROS neg except noted in HPI  Physical Exam   Blood pressure 154/88, temperature 97.8 F (36.6  C), resp. rate 18, height 5\' 1"  (1.549 m), weight 121.564 kg (268 lb), last menstrual period 05/13/2014.  Physical Exam  Constitutional: She is oriented to person, place, and time. She appears well-developed.  Morbidly obese  HENT:  Head: Normocephalic and atraumatic.  Neck: Normal range of motion. Neck supple.  Respiratory: Effort normal.  Genitourinary:  Vulvar abscess. White, thin vaginal discharge. Cervical exam: fingertip  Musculoskeletal: Normal range of motion.  Neurological: She is oriented to person, place, and time.  Skin: Skin is warm and dry.    MAU Course  Procedures  MDM NST reassuring  Assessment and Plan  Patient is 34 y.o. [id number] 2158w5d reporting lower abdominal pain  # lower abdominal pain- low suspicion for preterm labor due to cervical check and no  contractions. 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   Berenice BoutonKelly R Giovanetti is a 34 y.o. 316 [id number] 7317G4P2103 reporting back pain, vaginal discharge +FM, denies LOF, VB  PE: BP 127/71 mmHg  Pulse 95  Temp(Src) 97.8 F (36.6 C)  Resp 18  Ht 5\' 1"  (1.549 m)  Wt 268 lb (121.564 kg)  BMI 50.66 kg/m2  LMP 05/13/2014 Gen: calm comfortable, NAD Resp: normal effort, no distress Abd: gravid GU: 2cm fluctuant area with central pustules, nontender  ROS, labs, PMH reviewed NST reactive, initially no contractions then regular q453min contractions CL: 4.3cm, ordered 2/2 contractions, FFN thrown away on accident  Plan: - fetal kick counts reinforced, preterm labor precautions - continue routine follow up in OB clinic - rx procardia 10mg  #10 prn contractions - rx clindamycin 300mg , declines I&D reporting they normally start draining on their own  Perry MountACOSTA,Candiss Galeana ROCIO, MD 11:48 AM

## 2014-12-21 NOTE — MAU Note (Addendum)
Pt presents to MAU with complaints of lower abdominal pain with vaginal pressure that started around 5 this morning. Denies any vaginal bleeding or LOF. Pt has history of preterm delivery and 2 previous cesarean sections.  Pt also complains of itchy watery brown vaginal discharge.  Last intercourse or VE last week.  Currently take 17P injections.  Last time last Thurs.

## 2014-12-21 NOTE — Discharge Instructions (Signed)
Preterm Birth °Preterm birth is a birth that happens before 37 weeks of pregnancy. Most pregnancies last about 39-41 weeks. Every week in the womb is important and is beneficial to the health of the infant. Infants born before 37 weeks of pregnancy are at a higher risk for complications. Depending on when the infant was born, he or she may be: °· Late preterm. Born between 32 weeks and 37 weeks of pregnancy. °· Very preterm. Born at less than 32 weeks of pregnancy. °· Extremely preterm. Born at less than 25 weeks of pregnancy. °The earlier a baby is born, the more likely the child will have issues related to prematurity. Complications and problems that can be seen in infants born too early include: °· Problems breathing (respiratory distress syndrome). °· Low birth weight. °· Problems feeding. °· Sleeping problems. °· Yellowing of the skin (jaundice). °· Infections such as pneumonia.  °Babies born very preterm or extremely preterm are at risk for more serious medical issues. These include: °· More severe breathing issues. °· Eyesight issues. °· Brain development issues (intraventricular hemorrhage). °· Behavioral and emotional development issues. °· Growth and developmental delays. °· Cerebral palsy. °· Serious feeding or bowel complications (necrotizing enterocolitis). °CAUSES  °There are two broad categories of preterm birth. °· Spontaneous preterm birth. This is a birth resulting from preterm labor (not medically induced) or preterm premature rupture of membranes (PPROM). °· Indicated preterm birth. This is a birth resulting from labor being medically induced due to health, personal, or social reasons. °RISK FACTORS °Preterm birth may be related to certain medical conditions, lifestyle factors, or demographic factors encountered by the mother or fetus. °· Medical conditions include: °¨ Multiple gestations (twins, triplets, and so on). °¨ Infection. °¨ Diabetes. °¨ Heart disease. °¨ Kidney disease. °¨ Cervical or  uterine abnormalities. °¨ Being underweight. °¨ High blood pressure or preeclampsia. °¨ Premature rupture of membranes (PROM). °¨ Birth defects in the fetus. °· Lifestyle factors include: °¨ Poor prenatal care. °¨ Poor nutrition or anemia. °¨ Cigarette smoking. °¨ Consuming alcohol. °¨ High levels of stress and lack of social or emotional support. °¨ Exposure to chemical or environmental toxins. °¨ Substance abuse. °· Demographic factors include: °¨ African-American ethnicity. °¨ Age (younger than 18 or older than 32 years of age). °¨ Low socioeconomic status. °Women with a history of preterm labor or who become pregnant within 18 months of giving birth are also at increased risk for preterm birth. °DIAGNOSIS  °Your health care provider may request additional tests to diagnose underlying complications resulting from preterm birth. Tests on the infant may include: °· Physical exam. °· Blood tests. °· Chest X-rays. °· Heart-lung monitoring. °TREATMENT  °After birth, special care will be taken to assess any problems or complications for the infant. Supportive care will be provided for the infant. Treatment depends on what problems are present and any complications that develop. Some preterm infants are cared for in a neonatal intensive care unit. In general, care may include: °· Maintaining temperature and oxygen in a clear heated box (baby isolette). °· Monitoring the infant's heart rate, breathing, and level of oxygen in the blood. °· Monitoring for signs of infection and, if needed, giving IV antibiotic medicine. °· Inserting a feeding tube (nose, mouth) or giving IV nutrition if unable to feed. °· Inserting a breathing tube (ventilation). °· Respiration support (continuous positive airway pressure [CPAP] or oxygen).  °Treatment will change as the infant builds up strength and is able to breathe and eat on his or her   own. For some infants, no special treatment is necessary. Parents may be educated on the potential  health risks of prematurity to the infant. °HOME CARE INSTRUCTIONS °· Understand your infant's special conditions and needs. It may be reassuring to learn about infant CPR. °· Monitor your infant in the car seat until he or she grows and matures. Infant car seats can cause breathing difficulties for preterm infants. °· Keep your infant warm. Dress your infant in layers and keep him or her away from drafts, especially in cold months of the year. °· Wash your hands thoroughly after going to the bathroom or changing a diaper. Late preterm infants may be more prone to infection. °· Follow all your health care provider's instructions for providing support and care to your preterm infant. °· Get support from organizations and groups that understand your challenges. °· Follow up with your infant's health care provider as directed. °Prevention °There are some things you can do to help lower your risk of having a preterm infant in the future. These include: °· Good prenatal care throughout the entire pregnancy. See a health care provider regularly for advice and tests. °· Management of underlying medical conditions. °· Proper self-care and lifestyle changes. °· Proper diet and weight control. °· Watching for signs of various infections. °SEEK MEDICAL CARE IF: °· Your infant has feeding difficulties. °· Your infant has sleeping difficulties. °· Your infant has breathing difficulties. °· Your infant's skin starts to look yellow. °· Your infant shows signs of infection, such as a stuffy nose, fever, crying, or bluish color of the skin. °FOR MORE INFORMATION °[employer] of [employer]: www.marchofdimes.com °Prematurity[employer]org: www.prematurity.org °Document Released: 02/02/2004 Document Revised: 09/02/2013 Document Reviewed: 06/11/2013 °[employer]® Patient Information ©2015 [employer]. This information is not intended to replace advice given to you by your health care provider. Make sure you discuss any questions you have with your health  care provider. ° °

## 2014-12-23 ENCOUNTER — Ambulatory Visit (INDEPENDENT_AMBULATORY_CARE_PROVIDER_SITE_OTHER): Payer: Medicaid Other | Admitting: Obstetrics & Gynecology

## 2014-12-23 ENCOUNTER — Encounter: Payer: Self-pay | Admitting: Obstetrics & Gynecology

## 2014-12-23 VITALS — BP 130/80 | Wt 265.0 lb

## 2014-12-23 DIAGNOSIS — Z331 Pregnant state, incidental: Secondary | ICD-10-CM

## 2014-12-23 DIAGNOSIS — O09893 Supervision of other high risk pregnancies, third trimester: Secondary | ICD-10-CM

## 2014-12-23 DIAGNOSIS — Z1389 Encounter for screening for other disorder: Secondary | ICD-10-CM

## 2014-12-23 DIAGNOSIS — O10019 Pre-existing essential hypertension complicating pregnancy, unspecified trimester: Secondary | ICD-10-CM

## 2014-12-23 DIAGNOSIS — O24913 Unspecified diabetes mellitus in pregnancy, third trimester: Secondary | ICD-10-CM

## 2014-12-23 DIAGNOSIS — O0993 Supervision of high risk pregnancy, unspecified, third trimester: Secondary | ICD-10-CM

## 2014-12-23 DIAGNOSIS — O09213 Supervision of pregnancy with history of pre-term labor, third trimester: Secondary | ICD-10-CM

## 2014-12-23 DIAGNOSIS — O24919 Unspecified diabetes mellitus in pregnancy, unspecified trimester: Secondary | ICD-10-CM

## 2014-12-23 MED ORDER — HYDROXYPROGESTERONE CAPROATE 250 MG/ML IM OIL
250.0000 mg | TOPICAL_OIL | Freq: Once | INTRAMUSCULAR | Status: AC
  Administered 2014-12-23: 250 mg via INTRAMUSCULAR

## 2014-12-23 NOTE — Progress Notes (Signed)
Patient ID: Theresa Malone, female   DOB: [birth year], 34 y.o.   MRN: [id number] Pt given 17P injection in Right Deltoid.

## 2014-12-23 NOTE — Progress Notes (Signed)
Reactive NST   High Risk Pregnancy Diagnosis(es):   Class B DM, chronic hypertension and pre term delivery  [id number] 806w0d Estimated Date of Delivery: 02/17/15  Blood pressure 130/80, weight 265 lb (120.203 kg), last menstrual period 05/13/2014.  Urinalysis: Negative   HPI: CBG are sub optimal     BP weight and urine results all reviewed and noted. Patient reports good fetal movement, denies any bleeding and no rupture of membranes symptoms or regular contractions.  Fundal Height:  40 Fetal Heart rate:  145 Edema:  none  Patient is without complaints. All questions were answered.  Assessment:  646w0d,   Class B DM, chronic hypertension, history pre term delivery  Medication(s) Plans:  AM 38/24    PM 38/24  Treatment Plan:  twice weekly surveillance continues  Follow up in Monday weeks for OB appt, sonogram

## 2014-12-23 NOTE — Addendum Note (Signed)
Addended by: Richardson ChiquitoRAVIS, ASHLEY M on: 12/23/2014 03:14 PM   Modules accepted: Orders

## 2014-12-24 LAB — POCT URINALYSIS DIPSTICK
GLUCOSE UA: NEGATIVE
KETONES UA: NEGATIVE
LEUKOCYTES UA: NEGATIVE
NITRITE UA: NEGATIVE
RBC UA: NEGATIVE

## 2014-12-27 ENCOUNTER — Other Ambulatory Visit: Payer: Self-pay | Admitting: Obstetrics & Gynecology

## 2014-12-27 ENCOUNTER — Ambulatory Visit (INDEPENDENT_AMBULATORY_CARE_PROVIDER_SITE_OTHER): Payer: Medicaid Other

## 2014-12-27 ENCOUNTER — Ambulatory Visit (INDEPENDENT_AMBULATORY_CARE_PROVIDER_SITE_OTHER): Payer: Medicaid Other | Admitting: Obstetrics & Gynecology

## 2014-12-27 ENCOUNTER — Encounter: Payer: Self-pay | Admitting: Obstetrics & Gynecology

## 2014-12-27 VITALS — BP 130/80 | Wt 268.0 lb

## 2014-12-27 DIAGNOSIS — O0993 Supervision of high risk pregnancy, unspecified, third trimester: Secondary | ICD-10-CM

## 2014-12-27 DIAGNOSIS — O10019 Pre-existing essential hypertension complicating pregnancy, unspecified trimester: Secondary | ICD-10-CM

## 2014-12-27 DIAGNOSIS — O24913 Unspecified diabetes mellitus in pregnancy, third trimester: Secondary | ICD-10-CM

## 2014-12-27 DIAGNOSIS — Z1389 Encounter for screening for other disorder: Secondary | ICD-10-CM

## 2014-12-27 DIAGNOSIS — Z331 Pregnant state, incidental: Secondary | ICD-10-CM

## 2014-12-27 LAB — US OB FOLLOW UP

## 2014-12-27 NOTE — Progress Notes (Signed)
Sonogram is reviewed, fetal macrosomia but otherwise normal   High Risk Pregnancy Diagnosis(es):   Class B DM, chronic hypertension  G4P2103 4180w4d Estimated Date of Delivery: 02/17/15  Blood pressure 130/80, weight 268 lb (121.564 kg), last menstrual period 05/13/2014.  Urinalysis: Negative   HPI: CBG are better     BP weight and urine results all reviewed and noted. Patient reports good fetal movement, denies any bleeding and no rupture of membranes symptoms or regular contractions.  Fundal Height:  38 Fetal Heart rate:  146 Edema:  trace  Patient is without complaints. All questions were answered.  Assessment:  7280w4d,   Class B DM, chronic hypertension  Am 38/24      PM 38/24    Keep the same  Medication(s) Plans:  No changes  Treatment Plan:   Twice weekly  Follow up in thursday weeks for OB appt, nst

## 2014-12-27 NOTE — Progress Notes (Signed)
U/S(32+4wks)-vtx active fetus, FHR- 140 bpm, EFw 6 lb 4 oz (>97th%tile), fluid WNL AFI-13.2cm SDP- 7.0cm, fundal Gr 2 placenta, BPP 8/8, UA Doppler RI- 0.69 & 0.65

## 2014-12-29 ENCOUNTER — Encounter (HOSPITAL_COMMUNITY): Payer: Self-pay | Admitting: *Deleted

## 2014-12-29 ENCOUNTER — Inpatient Hospital Stay (HOSPITAL_COMMUNITY)
Admission: AD | Admit: 2014-12-29 | Discharge: 2014-12-29 | Disposition: A | Discharge status: Home / Self Care | Payer: Medicaid Other | Source: Ambulatory Visit | Attending: Obstetrics & Gynecology | Admitting: Obstetrics & Gynecology

## 2014-12-29 DIAGNOSIS — O368130 Decreased fetal movements, third trimester, not applicable or unspecified: Secondary | ICD-10-CM | POA: Diagnosis not present

## 2014-12-29 DIAGNOSIS — E119 Type 2 diabetes mellitus without complications: Secondary | ICD-10-CM | POA: Diagnosis not present

## 2014-12-29 DIAGNOSIS — O10013 Pre-existing essential hypertension complicating pregnancy, third trimester: Secondary | ICD-10-CM | POA: Insufficient documentation

## 2014-12-29 DIAGNOSIS — O368131 Decreased fetal movements, third trimester, fetus 1: Secondary | ICD-10-CM

## 2014-12-29 DIAGNOSIS — Z3A32 32 weeks gestation of pregnancy: Secondary | ICD-10-CM | POA: Diagnosis not present

## 2014-12-29 DIAGNOSIS — O368120 Decreased fetal movements, second trimester, not applicable or unspecified: Secondary | ICD-10-CM | POA: Diagnosis present

## 2014-12-29 DIAGNOSIS — O24113 Pre-existing diabetes mellitus, type 2, in pregnancy, third trimester: Secondary | ICD-10-CM | POA: Insufficient documentation

## 2014-12-29 DIAGNOSIS — O3421 Maternal care for scar from previous cesarean delivery: Secondary | ICD-10-CM | POA: Diagnosis not present

## 2014-12-29 DIAGNOSIS — Z794 Long term (current) use of insulin: Secondary | ICD-10-CM | POA: Diagnosis not present

## 2014-12-29 LAB — POCT URINALYSIS DIPSTICK
Glucose, UA: NEGATIVE
KETONES UA: NEGATIVE
LEUKOCYTES UA: NEGATIVE
NITRITE UA: NEGATIVE
RBC UA: NEGATIVE

## 2014-12-29 NOTE — MAU Provider Note (Signed)
History     CSN: [id number]  Arrival date and time: 12/29/14 1754   None     Chief Complaint  Patient presents with  . Decreased Fetal Movement  . Abdominal Cramping   HPI  Patient is 34 y.o. [id number] [redacted]w[redacted]d here with complaints of decreased fetal movement.  Did not feel any fetal movement since yesterday.  Reports skipping meals and medications, fasting glucose 65, after breakfast was 200, after lunch 134.  Did fetal kick counts prior to arrival, felt 4 kicks in 2 hours.  Since being here in MAU has had good fetal movement.  Insulin: NPH 38u BID, novolog 34u with breakfast/dinner, last seen in clinic 2/1 and has appointment tomorrow for NST.  +FM, denies LOF, VB, contractions, vaginal discharge.   Past Medical History  Diagnosis Date  . Hypertension   . Diabetes mellitus     Past Surgical History  Procedure Laterality Date  . Cesarean section      C/S x 2    Family History  Problem Relation Age of Onset  . Diabetes Paternal Grandfather   . Cancer Paternal Grandmother     liver  . Hypertension Father   . Diabetes Mother   . Hypertension Mother   . Diabetes Maternal Uncle   . Diabetes Maternal Grandmother   . Cancer Maternal Grandfather   . Diabetes Daughter     boarderline   . Asthma Daughter   . Bronchitis Daughter   . Bronchitis Son   . Bronchitis Daughter     History  Substance Use Topics  . Smoking status: Never Smoker   . Smokeless tobacco: Never Used  . Alcohol Use: No     Comment: occ    Allergies:  Allergies  Allergen Reactions  . Lisinopril Cough    cough    Prescriptions prior to admission  Medication Sig Dispense Refill Last Dose  . clindamycin (CLEOCIN) 300 MG capsule Take 1 capsule (300 mg total) by mouth 3 (three) times daily. 30 capsule 0 12/29/2014 at Unknown time  . glyBURIDE (DIABETA) 5 MG tablet Take 2 tablets (10 mg total) by mouth 2 (two) times daily with a meal. 120 tablet 6 12/29/2014 at Unknown time  . insulin aspart (NOVOLOG)  100 UNIT/ML injection Inject 20 Units into the skin 2 (two) times daily. 20 units in am 20 units in pm. 10 mL 12 12/29/2014 at Unknown time  . insulin NPH Human (HUMULIN N,NOVOLIN N) 100 UNIT/ML injection Inject 0.34 mLs (34 Units total) into the skin 2 (two) times daily before a meal. 10 mL 6 12/29/2014 at Unknown time  . labetalol (NORMODYNE) 200 MG tablet Take 1 tablet (200 mg total) by mouth 2 (two) times daily. 180 tablet 2 12/29/2014 at Unknown time  . Prenatal Multivit-Min-Fe-FA (PRENATAL VITAMINS PO) Take by mouth daily.   12/29/2014 at Unknown time  . acetaminophen (TYLENOL) 325 MG tablet Take 650 mg by mouth every 6 (six) hours as needed for headache.   Taking  . INSULIN SYRINGE 1CC/29G 29G X 1/2" 1 ML MISC 1 Syringe by Does not apply route daily. 100 each prn Taking  . sulfamethoxazole-trimethoprim (BACTRIM DS,SEPTRA DS) 800-160 MG per tablet Take 1 tablet by mouth 2 (two) times daily. (Patient not taking: Reported on 12/23/2014) 14 tablet 0 Not Taking    Review of Systems  Constitutional: Negative for fever and chills.  Respiratory: Negative for cough and shortness of breath.   Cardiovascular: Negative for chest pain and leg swelling.  Gastrointestinal: Negative  for heartburn, nausea, vomiting and diarrhea.  Genitourinary: Negative for dysuria, urgency, frequency and hematuria.  Neurological:       No headache   Physical Exam   Blood pressure 137/63, pulse 91, temperature 98.2 F (36.8 C), temperature source Oral, resp. rate 18, last menstrual period 05/13/2014.  Physical Exam  Constitutional: She is oriented to person, place, and time. She appears well-developed and well-nourished.  HENT:  Head: Normocephalic and atraumatic.  Eyes: Conjunctivae and EOM are normal.  Neck: Normal range of motion.  Cardiovascular: Normal rate.   Respiratory: Effort normal. No respiratory distress.  GI: Soft. She exhibits no distension. There is no tenderness.  Musculoskeletal: Normal range of motion.  She exhibits no edema.  Neurological: She is alert and oriented to person, place, and time.  Skin: Skin is warm and dry. No erythema.    MAU Course  Procedures  MDM NST reactive  Assessment and Plan  Patient is 34 y.o. [phone] 7065w6d reporting decreased fetal movement since yesterday now with good fetal movement and reactive NST - fetal kick counts reinforced - preterm labor precautions  DM: f/u in clinic tomorrow with log, no changes made tonight 2/2 no log and suspicion that hyperglycemia may in fact be due to noncompliance/irregular meals  CHTN: well controlled at this time   Theresa Malone ROCIO 12/29/2014, 6:33 PM

## 2014-12-29 NOTE — MAU Note (Signed)
No fetal movement today.   Some mild cramping in lower abd. No bleeding or leaking

## 2014-12-29 NOTE — Discharge Instructions (Signed)
fFetal Movement Counts Patient Name: __________________________________________________ Patient Due Date: ____________________ Performing a fetal movement count is highly recommended in high-risk pregnancies, but it is good for every pregnant woman to do. Your health care provider may ask you to start counting fetal movements at 28 weeks of the pregnancy. Fetal movements often increase:  After eating a full meal.  After physical activity.  After eating or drinking something sweet or cold.  At rest. Pay attention to when you feel the baby is most active. This will help you notice a pattern of your baby's sleep and wake cycles and what factors contribute to an increase in fetal movement. It is important to perform a fetal movement count at the same time each day when your baby is normally most active.  HOW TO COUNT FETAL MOVEMENTS 1. Find a quiet and comfortable area to sit or lie down on your left side. Lying on your left side provides the best blood and oxygen circulation to your baby. 2. Write down the day and time on a sheet of paper or in a journal. 3. Start counting kicks, flutters, swishes, rolls, or jabs in a 2-hour period. You should feel at least 10 movements within 2 hours. 4. If you do not feel 10 movements in 2 hours, wait 2-3 hours and count again. Look for a change in the pattern or not enough counts in 2 hours. SEEK MEDICAL CARE IF:  You feel less than 10 counts in 2 hours, tried twice.  There is no movement in over an hour.  The pattern is changing or taking longer each day to reach 10 counts in 2 hours.  You feel the baby is not moving as he or she usually does. Date: ____________ Movements: ____________ Start time: ____________ Theresa MartinFinish time: ____________  Date: ____________ Movements: ____________ Start time: ____________ Theresa MartinFinish time: ____________ Date: ____________ Movements: ____________ Start time: ____________ Theresa MartinFinish time: ____________ Date: ____________ Movements:  ____________ Start time: ____________ Theresa MartinFinish time: ____________ Date: ____________ Movements: ____________ Start time: ____________ Theresa MartinFinish time: ____________ Date: ____________ Movements: ____________ Start time: ____________ Theresa MartinFinish time: ____________ Date: ____________ Movements: ____________ Start time: ____________ Theresa MartinFinish time: ____________ Date: ____________ Movements: ____________ Start time: ____________ Theresa MartinFinish time: ____________  Date: ____________ Movements: ____________ Start time: ____________ Theresa MartinFinish time: ____________ Date: ____________ Movements: ____________ Start time: ____________ Theresa MartinFinish time: ____________ Date: ____________ Movements: ____________ Start time: ____________ Theresa MartinFinish time: ____________ Date: ____________ Movements: ____________ Start time: ____________ Theresa MartinFinish time: ____________ Date: ____________ Movements: ____________ Start time: ____________ Theresa MartinFinish time: ____________ Date: ____________ Movements: ____________ Start time: ____________ Theresa MartinFinish time: ____________ Date: ____________ Movements: ____________ Start time: ____________ Theresa MartinFinish time: ____________  Date: ____________ Movements: ____________ Start time: ____________ Theresa MartinFinish time: ____________ Date: ____________ Movements: ____________ Start time: ____________ Theresa MartinFinish time: ____________ Date: ____________ Movements: ____________ Start time: ____________ Theresa MartinFinish time: ____________ Date: ____________ Movements: ____________ Start time: ____________ Theresa MartinFinish time: ____________ Date: ____________ Movements: ____________ Start time: ____________ Theresa MartinFinish time: ____________ Date: ____________ Movements: ____________ Start time: ____________ Theresa MartinFinish time: ____________ Date: ____________ Movements: ____________ Start time: ____________ Theresa MartinFinish time: ____________  Date: ____________ Movements: ____________ Start time: ____________ Theresa MartinFinish time: ____________ Date: ____________ Movements: ____________ Start time: ____________ Theresa MartinFinish  time: ____________ Date: ____________ Movements: ____________ Start time: ____________ Theresa MartinFinish time: ____________ Date: ____________ Movements: ____________ Start time: ____________ Theresa MartinFinish time: ____________ Date: ____________ Movements: ____________ Start time: ____________ Theresa MartinFinish time: ____________ Date: ____________ Movements: ____________ Start time: ____________ Theresa MartinFinish time: ____________ Date: ____________ Movements: ____________ Start time: ____________ Theresa MartinFinish time: ____________  Date: ____________ Movements: ____________ Start time: ____________ Theresa MartinFinish  time: ____________ °Date: ____________ Movements: ____________ Start time: ____________ Finish time: ____________ °Date: ____________ Movements: ____________ Start time: ____________ Finish time: ____________ °Date: ____________ Movements: ____________ Start time: ____________ Finish time: ____________ °Date: ____________ Movements: ____________ Start time: ____________ Finish time: ____________ °Date: ____________ Movements: ____________ Start time: ____________ Finish time: ____________ °Date: ____________ Movements: ____________ Start time: ____________ Finish time: ____________  °Date: ____________ Movements: ____________ Start time: ____________ Finish time: ____________ °Date: ____________ Movements: ____________ Start time: ____________ Finish time: ____________ °Date: ____________ Movements: ____________ Start time: ____________ Finish time: ____________ °Date: ____________ Movements: ____________ Start time: ____________ Finish time: ____________ °Date: ____________ Movements: ____________ Start time: ____________ Finish time: ____________ °Date: ____________ Movements: ____________ Start time: ____________ Finish time: ____________ °Date: ____________ Movements: ____________ Start time: ____________ Finish time: ____________  °Date: ____________ Movements: ____________ Start time: ____________ Finish time: ____________ °Date: ____________  Movements: ____________ Start time: ____________ Finish time: ____________ °Date: ____________ Movements: ____________ Start time: ____________ Finish time: ____________ °Date: ____________ Movements: ____________ Start time: ____________ Finish time: ____________ °Date: ____________ Movements: ____________ Start time: ____________ Finish time: ____________ °Date: ____________ Movements: ____________ Start time: ____________ Finish time: ____________ °Date: ____________ Movements: ____________ Start time: ____________ Finish time: ____________  °Date: ____________ Movements: ____________ Start time: ____________ Finish time: ____________ °Date: ____________ Movements: ____________ Start time: ____________ Finish time: ____________ °Date: ____________ Movements: ____________ Start time: ____________ Finish time: ____________ °Date: ____________ Movements: ____________ Start time: ____________ Finish time: ____________ °Date: ____________ Movements: ____________ Start time: ____________ Finish time: ____________ °Date: ____________ Movements: ____________ Start time: ____________ Finish time: ____________ °Document Released: 12/12/2006 Document Revised: 03/29/2014 Document Reviewed: 09/08/2012 °[employer]® Patient Information ©2015 [employer]. This information is not intended to replace advice given to you by your health care provider. Make sure you discuss any questions you have with your health care provider. ° °

## 2014-12-30 ENCOUNTER — Other Ambulatory Visit: Payer: Medicaid Other | Admitting: Obstetrics & Gynecology

## 2015-01-03 ENCOUNTER — Encounter: Payer: Self-pay | Admitting: Obstetrics & Gynecology

## 2015-01-03 ENCOUNTER — Ambulatory Visit (INDEPENDENT_AMBULATORY_CARE_PROVIDER_SITE_OTHER): Payer: Medicaid Other | Admitting: Obstetrics & Gynecology

## 2015-01-03 VITALS — BP 120/80 | Wt 275.0 lb

## 2015-01-03 DIAGNOSIS — Z331 Pregnant state, incidental: Secondary | ICD-10-CM

## 2015-01-03 DIAGNOSIS — O0993 Supervision of high risk pregnancy, unspecified, third trimester: Secondary | ICD-10-CM

## 2015-01-03 DIAGNOSIS — O10019 Pre-existing essential hypertension complicating pregnancy, unspecified trimester: Secondary | ICD-10-CM

## 2015-01-03 DIAGNOSIS — Z1389 Encounter for screening for other disorder: Secondary | ICD-10-CM

## 2015-01-03 DIAGNOSIS — O09213 Supervision of pregnancy with history of pre-term labor, third trimester: Secondary | ICD-10-CM

## 2015-01-03 DIAGNOSIS — O09893 Supervision of other high risk pregnancies, third trimester: Secondary | ICD-10-CM

## 2015-01-03 DIAGNOSIS — O24913 Unspecified diabetes mellitus in pregnancy, third trimester: Secondary | ICD-10-CM

## 2015-01-03 LAB — POCT URINALYSIS DIPSTICK
Glucose, UA: NEGATIVE
Ketones, UA: NEGATIVE
Nitrite, UA: NEGATIVE
Protein, UA: NEGATIVE
RBC UA: NEGATIVE

## 2015-01-03 MED ORDER — METRONIDAZOLE 500 MG PO TABS: 500.0000 mg | ORAL_TABLET | Freq: Two times a day (BID) | ORAL | Status: DC

## 2015-01-03 MED ORDER — HYDROXYPROGESTERONE CAPROATE 250 MG/ML IM OIL
250.0000 mg | TOPICAL_OIL | Freq: Once | INTRAMUSCULAR | Status: AC
  Administered 2015-01-03: 250 mg via INTRAMUSCULAR

## 2015-01-03 NOTE — Progress Notes (Signed)
Reactive NST   High Risk Pregnancy Diagnosis(es):   Class B DM, Chronic Hypertension  G4P2103 7251w4d Estimated Date of Delivery: 02/17/15  Blood pressure 120/80, weight 275 lb (124.739 kg), last menstrual period 05/13/2014.  Urinalysis: Negative   HPI: Pt felt wet on her pad last night, no gush of fluid.   Wet Prep: +BV no yeast no trich No ferning  CBG are ok for her still not perfect inconsistent which makes adjustments difficult especially her fastings ranging from 75-114 the rest are decent if sub optimal     BP weight and urine results all reviewed and noted. Patient reports good fetal movement, denies any bleeding and no rupture of membranes symptoms or regular contractions.  Fundal Height:  38 Fetal Heart rate:  140 Edema:  trce  Patient is without complaints. All questions were answered.  Assessment:  5451w4d,   Class B DM, Chronic Hypertension  Medication(s) Plans:  AM NPH 42/ 28 Reg      PM  NPH 42/  28 Reg  Treatment Plan:   Continue current labetalol and change insulin as above  Follow up in thursday weeks for appointment for high risk OB care, sonogram

## 2015-01-03 NOTE — Addendum Note (Signed)
Addended by: Criss AlvinePULLIAM, CHRYSTAL G on: 01/03/2015 10:25 AM   Modules accepted: Orders

## 2015-01-04 ENCOUNTER — Other Ambulatory Visit: Payer: Self-pay | Admitting: Obstetrics & Gynecology

## 2015-01-04 DIAGNOSIS — O24419 Gestational diabetes mellitus in pregnancy, unspecified control: Secondary | ICD-10-CM

## 2015-01-04 DIAGNOSIS — O163 Unspecified maternal hypertension, third trimester: Secondary | ICD-10-CM

## 2015-01-07 ENCOUNTER — Encounter: Payer: Self-pay | Admitting: Obstetrics & Gynecology

## 2015-01-07 ENCOUNTER — Ambulatory Visit (INDEPENDENT_AMBULATORY_CARE_PROVIDER_SITE_OTHER): Payer: Medicaid Other | Admitting: Obstetrics & Gynecology

## 2015-01-07 ENCOUNTER — Ambulatory Visit (INDEPENDENT_AMBULATORY_CARE_PROVIDER_SITE_OTHER): Payer: Medicaid Other

## 2015-01-07 VITALS — BP 138/80 | Wt 264.0 lb

## 2015-01-07 DIAGNOSIS — O09893 Supervision of other high risk pregnancies, third trimester: Secondary | ICD-10-CM

## 2015-01-07 DIAGNOSIS — Z1389 Encounter for screening for other disorder: Secondary | ICD-10-CM

## 2015-01-07 DIAGNOSIS — O163 Unspecified maternal hypertension, third trimester: Secondary | ICD-10-CM

## 2015-01-07 DIAGNOSIS — O0993 Supervision of high risk pregnancy, unspecified, third trimester: Secondary | ICD-10-CM

## 2015-01-07 DIAGNOSIS — Z331 Pregnant state, incidental: Secondary | ICD-10-CM

## 2015-01-07 DIAGNOSIS — O24419 Gestational diabetes mellitus in pregnancy, unspecified control: Secondary | ICD-10-CM

## 2015-01-07 DIAGNOSIS — O09213 Supervision of pregnancy with history of pre-term labor, third trimester: Secondary | ICD-10-CM

## 2015-01-07 DIAGNOSIS — O24913 Unspecified diabetes mellitus in pregnancy, third trimester: Secondary | ICD-10-CM

## 2015-01-07 DIAGNOSIS — O10019 Pre-existing essential hypertension complicating pregnancy, unspecified trimester: Secondary | ICD-10-CM

## 2015-01-07 LAB — POCT URINALYSIS DIPSTICK
Blood, UA: NEGATIVE
Glucose, UA: NEGATIVE
LEUKOCYTES UA: NEGATIVE
Nitrite, UA: NEGATIVE
Protein, UA: NEGATIVE

## 2015-01-07 NOTE — Progress Notes (Signed)
Sonogram is reviewed and read, report done, reassuring   High Risk Pregnancy Diagnosis(es):   Class B DM, Chronic hypertension  G4P2103 258w1d Estimated Date of Delivery: 02/17/15  Blood pressure 138/80, weight 264 lb (119.75 kg), last menstrual period 05/13/2014.  Urinalysis: Negative   HPI: CBG are pretty good  Make small adjustment     BP weight and urine results all reviewed and noted. Patient reports good fetal movement, denies any bleeding and no rupture of membranes symptoms or regular contractions.  Fundal Height:  39 Fetal Heart rate:  137 Edema:  1+  Patient is without complaints. All questions were answered.  Assessment:  6958w1d,   Class B DM, Chronic Hypertension, Pre term delivery history  Medication(s) Plans:  AM 42N/28R  PM 42N/28R  Treatment Plan:  Increase to 46N/32R   AM and  46N/32R PM  Follow up in Monday weeks for appointment for high risk OB care, NST

## 2015-01-07 NOTE — Progress Notes (Signed)
U/S(34+1wks)-Frank Breech active fetus, BPP 8/8, FHR- 137 bpm, UA Doppler RI-0.61 & 0.58, AFI WNL SDP-6.7cm AFI-13.9cm, Posterior Gr 2 placenta

## 2015-01-10 ENCOUNTER — Other Ambulatory Visit: Payer: Medicaid Other | Admitting: Obstetrics & Gynecology

## 2015-01-11 ENCOUNTER — Encounter: Payer: Self-pay | Admitting: Obstetrics and Gynecology

## 2015-01-11 ENCOUNTER — Ambulatory Visit (INDEPENDENT_AMBULATORY_CARE_PROVIDER_SITE_OTHER): Payer: Medicaid Other | Admitting: Obstetrics and Gynecology

## 2015-01-11 VITALS — BP 138/88 | Wt 276.0 lb

## 2015-01-11 DIAGNOSIS — Z6841 Body Mass Index (BMI) 40.0 and over, adult: Secondary | ICD-10-CM

## 2015-01-11 DIAGNOSIS — O09213 Supervision of pregnancy with history of pre-term labor, third trimester: Secondary | ICD-10-CM

## 2015-01-11 DIAGNOSIS — Z1389 Encounter for screening for other disorder: Secondary | ICD-10-CM

## 2015-01-11 DIAGNOSIS — Z331 Pregnant state, incidental: Secondary | ICD-10-CM

## 2015-01-11 DIAGNOSIS — O10019 Pre-existing essential hypertension complicating pregnancy, unspecified trimester: Secondary | ICD-10-CM

## 2015-01-11 DIAGNOSIS — O09893 Supervision of other high risk pregnancies, third trimester: Secondary | ICD-10-CM

## 2015-01-11 DIAGNOSIS — O24913 Unspecified diabetes mellitus in pregnancy, third trimester: Secondary | ICD-10-CM

## 2015-01-11 LAB — POCT URINALYSIS DIPSTICK
Blood, UA: NEGATIVE
Glucose, UA: NEGATIVE
KETONES UA: NEGATIVE
Leukocytes, UA: NEGATIVE
Nitrite, UA: NEGATIVE
PROTEIN UA: NEGATIVE

## 2015-01-11 NOTE — Progress Notes (Signed)
High Risk Pregnancy Diagnosis(es):   CHTN, Class BDM, prior PTB on 17-p,   Blood pressure 138/88, weight 276 lb (125.193 kg), last menstrual period 05/13/2014.  12POUNDS IN 4 DAYS : [id number] 2963w5d Estimated Date of Delivery: 02/17/15     Rpt c/s on 18thMarch or 17th     BP weight and urine results reviewed and notable for negative.. Patient reports    good fetal movement, denies any bleeding and no rupture of membranes symptoms or regular contractions .  Fundal Height:  53 with pannus, Fetal Heart rate:  148 Edema:  3+ pt noticing changes Urinalysis: Negative  .Assessment:6463w5d,   Increasing edema without PIH, CHTN, DMclassB,   Medication(s) Plans:no changes . fastings are edxcellent.  Treatment Plan:        No changes Follow up:           0.5 weeks for  BPP All questions were answered. Pt wishes to have section scheduled.

## 2015-01-11 NOTE — Progress Notes (Signed)
Pt states that she is hurting and she can't sleep at night because she can't breath.

## 2015-01-11 NOTE — Addendum Note (Signed)
Addended by: Tilda BurrowFERGUSON, Mayrin Schmuck V on: 01/11/2015 04:24 PM   Modules accepted: Medications

## 2015-01-12 ENCOUNTER — Other Ambulatory Visit: Payer: Self-pay | Admitting: Obstetrics & Gynecology

## 2015-01-12 ENCOUNTER — Telehealth: Payer: Self-pay | Admitting: *Deleted

## 2015-01-12 DIAGNOSIS — Z98891 History of uterine scar from previous surgery: Secondary | ICD-10-CM

## 2015-01-12 DIAGNOSIS — O24419 Gestational diabetes mellitus in pregnancy, unspecified control: Secondary | ICD-10-CM

## 2015-01-12 DIAGNOSIS — O163 Unspecified maternal hypertension, third trimester: Secondary | ICD-10-CM

## 2015-01-12 NOTE — Telephone Encounter (Signed)
Pt states needs lancets and strips for accu check nanno, checks blood sugars x 4 daily. Lancets and strips #100 with 3 [place] [facility] [place] per Dr. Despina HiddenEure.

## 2015-01-14 ENCOUNTER — Ambulatory Visit (INDEPENDENT_AMBULATORY_CARE_PROVIDER_SITE_OTHER): Payer: Medicaid Other

## 2015-01-14 DIAGNOSIS — O24419 Gestational diabetes mellitus in pregnancy, unspecified control: Secondary | ICD-10-CM

## 2015-01-14 DIAGNOSIS — O163 Unspecified maternal hypertension, third trimester: Secondary | ICD-10-CM

## 2015-01-14 DIAGNOSIS — Z98891 History of uterine scar from previous surgery: Secondary | ICD-10-CM

## 2015-01-14 DIAGNOSIS — Z9889 Other specified postprocedural states: Secondary | ICD-10-CM

## 2015-01-14 LAB — US OB FOLLOW UP

## 2015-01-14 NOTE — Progress Notes (Signed)
U/S(35+1wks)-Frank Breech active fetus BPP 8/8, fluid WNL AFI-14.9cm SDP- 6.3cm, Gr 2 placenta, FHR-138 bpm, EFw 8 lb 8 oz (>97th%tile), UA Doppler RI-0.64 & 0.58

## 2015-01-16 ENCOUNTER — Encounter (HOSPITAL_COMMUNITY): Payer: Self-pay | Admitting: *Deleted

## 2015-01-16 ENCOUNTER — Inpatient Hospital Stay (HOSPITAL_COMMUNITY)
Admission: AD | Admit: 2015-01-16 | Discharge: 2015-01-16 | Disposition: A | Discharge status: Home / Self Care | Payer: Medicaid Other | Source: Ambulatory Visit | Attending: Obstetrics & Gynecology | Admitting: Obstetrics & Gynecology

## 2015-01-16 DIAGNOSIS — Z794 Long term (current) use of insulin: Secondary | ICD-10-CM | POA: Diagnosis not present

## 2015-01-16 DIAGNOSIS — Z3A35 35 weeks gestation of pregnancy: Secondary | ICD-10-CM | POA: Insufficient documentation

## 2015-01-16 DIAGNOSIS — O9989 Other specified diseases and conditions complicating pregnancy, childbirth and the puerperium: Secondary | ICD-10-CM | POA: Diagnosis not present

## 2015-01-16 DIAGNOSIS — F410 Panic disorder [episodic paroxysmal anxiety] without agoraphobia: Secondary | ICD-10-CM

## 2015-01-16 DIAGNOSIS — R002 Palpitations: Secondary | ICD-10-CM | POA: Diagnosis not present

## 2015-01-16 DIAGNOSIS — G4700 Insomnia, unspecified: Secondary | ICD-10-CM

## 2015-01-16 DIAGNOSIS — O10013 Pre-existing essential hypertension complicating pregnancy, third trimester: Secondary | ICD-10-CM | POA: Insufficient documentation

## 2015-01-16 DIAGNOSIS — F430 Acute stress reaction: Secondary | ICD-10-CM

## 2015-01-16 DIAGNOSIS — R0600 Dyspnea, unspecified: Secondary | ICD-10-CM | POA: Insufficient documentation

## 2015-01-16 HISTORY — DX: Anxiety disorder, unspecified: F41.9

## 2015-01-16 MED ORDER — ALPRAZOLAM 0.25 MG PO TABS
0.5000 mg | ORAL_TABLET | Freq: Once | ORAL | Status: AC
  Administered 2015-01-16: 0.5 mg via ORAL
  Filled 2015-01-16: qty 2

## 2015-01-16 MED ORDER — ZOLPIDEM TARTRATE 5 MG PO TABS: 5.0000 mg | ORAL_TABLET | Freq: Every evening | ORAL | Status: DC | PRN

## 2015-01-16 NOTE — Discharge Instructions (Signed)
Panic Attacks °Panic attacks are sudden, short-lived surges of severe anxiety, fear, or discomfort. They may occur for no reason when you are relaxed, when you are anxious, or when you are sleeping. Panic attacks may occur for a number of reasons:  °· Healthy people occasionally have panic attacks in extreme, life-threatening situations, such as war or natural disasters. Normal anxiety is a protective mechanism of the body that helps us react to danger (fight or flight response). °· Panic attacks are often seen with anxiety disorders, such as panic disorder, social anxiety disorder, generalized anxiety disorder, and phobias. Anxiety disorders cause excessive or uncontrollable anxiety. They may interfere with your relationships or other life activities. °· Panic attacks are sometimes seen with other mental illnesses, such as depression and posttraumatic stress disorder. °· Certain medical conditions, prescription medicines, and drugs of abuse can cause panic attacks. °SYMPTOMS  °Panic attacks start suddenly, peak within 20 minutes, and are accompanied by four or more of the following symptoms: °· Pounding heart or fast heart rate (palpitations). °· Sweating. °· Trembling or shaking. °· Shortness of breath or feeling smothered. °· Feeling choked. °· Chest pain or discomfort. °· Nausea or strange feeling in your stomach. °· Dizziness, light-headedness, or feeling like you will faint. °· Chills or hot flushes. °· Numbness or tingling in your lips or hands and feet. °· Feeling that things are not real or feeling that you are not yourself. °· Fear of losing control or going crazy. °· Fear of dying. °Some of these symptoms can mimic serious medical conditions. For example, you may think you are having a heart attack. Although panic attacks can be very scary, they are not life threatening. °DIAGNOSIS  °Panic attacks are diagnosed through an assessment by your health care provider. Your health care provider will ask  questions about your symptoms, such as where and when they occurred. Your health care provider will also ask about your medical history and use of alcohol and drugs, including prescription medicines. Your health care provider may order blood tests or other studies to rule out a serious medical condition. Your health care provider may refer you to a mental health professional for further evaluation. °TREATMENT  °· Most healthy people who have one or two panic attacks in an extreme, life-threatening situation will not require treatment. °· The treatment for panic attacks associated with anxiety disorders or other mental illness typically involves counseling with a mental health professional, medicine, or a combination of both. Your health care provider will help determine what treatment is best for you. °· Panic attacks due to physical illness usually go away with treatment of the illness. If prescription medicine is causing panic attacks, talk with your health care provider about stopping the medicine, decreasing the dose, or substituting another medicine. °· Panic attacks due to alcohol or drug abuse go away with abstinence. Some adults need professional help in order to stop drinking or using drugs. °HOME CARE INSTRUCTIONS  °· Take all medicines as directed by your health care provider.   °· Schedule and attend follow-up visits as directed by your health care provider. It is important to keep all your appointments. °SEEK MEDICAL CARE IF: °· You are not able to take your medicines as prescribed. °· Your symptoms do not improve or get worse. °SEEK IMMEDIATE MEDICAL CARE IF:  °· You experience panic attack symptoms that are different than your usual symptoms. °· You have serious thoughts about hurting yourself or others. °· You are taking medicine for panic attacks and   have a serious side effect. MAKE SURE YOU:  Understand these instructions.  Will watch your condition.  Will get help right away if you are not  doing well or get worse. Document Released: 11/12/2005 Document Revised: 11/17/2013 Document Reviewed: 06/26/2013 [facility] Patient Information 2015 [place]. This information is not intended to replace advice given to you by your health care provider. Make sure you discuss any questions you have with your health care provider.  Insomnia Insomnia is frequent trouble falling and/or staying asleep. Insomnia can be a long term problem or a short term problem. Both are common. Insomnia can be a short term problem when the wakefulness is related to a certain stress or worry. Long term insomnia is often related to ongoing stress during waking hours and/or poor sleeping habits. Overtime, sleep deprivation itself can make the problem worse. Every little thing feels more severe because you are overtired and your ability to cope is decreased. CAUSES   Stress, anxiety, and depression.  Poor sleeping habits.  Distractions such as TV in the bedroom.  Naps close to bedtime.  Engaging in emotionally charged conversations before bed.  Technical reading before sleep.  Alcohol and other sedatives. They may make the problem worse. They can hurt normal sleep patterns and normal dream activity.  Stimulants such as caffeine for several hours prior to bedtime.  Pain syndromes and shortness of breath can cause insomnia.  Exercise late at night.  Changing time zones may cause sleeping problems (jet lag). It is sometimes helpful to have someone observe your sleeping patterns. They should look for periods of not breathing during the night (sleep apnea). They should also look to see how long those periods last. If you live alone or observers are uncertain, you can also be observed at a sleep clinic where your sleep patterns will be professionally monitored. Sleep apnea requires a checkup and treatment. Give your caregivers your medical history. Give your caregivers observations your family has made about your  sleep.  SYMPTOMS   Not feeling rested in the morning.  Anxiety and restlessness at bedtime.  Difficulty falling and staying asleep. TREATMENT   Your caregiver may prescribe treatment for an underlying medical disorders. Your caregiver can give advice or help if you are using alcohol or other drugs for self-medication. Treatment of underlying problems will usually eliminate insomnia problems.  Medications can be prescribed for short time use. They are generally not recommended for lengthy use.  Over-the-counter sleep medicines are not recommended for lengthy use. They can be habit forming.  You can promote easier sleeping by making lifestyle changes such as:  Using relaxation techniques that help with breathing and reduce muscle tension.  Exercising earlier in the day.  Changing your diet and the time of your last meal. No night time snacks.  Establish a regular time to go to bed.  Counseling can help with stressful problems and worry.  Soothing music and white noise may be helpful if there are background noises you cannot remove.  Stop tedious detailed work at least one hour before bedtime. HOME CARE INSTRUCTIONS   Keep a diary. Inform your caregiver about your progress. This includes any medication side effects. See your caregiver regularly. Take note of:  Times when you are asleep.  Times when you are awake during the night.  The quality of your sleep.  How you feel the next day. This information will help your caregiver care for you.  Get out of bed if you are still awake after  15 minutes. Read or do some quiet activity. Keep the lights down. Wait until you feel sleepy and go back to bed.  Keep regular sleeping and waking hours. Avoid naps.  Exercise regularly.  Avoid distractions at bedtime. Distractions include watching television or engaging in any intense or detailed activity like attempting to balance the household checkbook.  Develop a bedtime ritual. Keep  a familiar routine of bathing, brushing your teeth, climbing into bed at the same time each night, listening to soothing music. Routines increase the success of falling to sleep faster.  Use relaxation techniques. This can be using breathing and muscle tension release routines. It can also include visualizing peaceful scenes. You can also help control troubling or intruding thoughts by keeping your mind occupied with boring or repetitive thoughts like the old concept of counting sheep. You can make it more creative like imagining planting one beautiful flower after another in your backyard garden.  During your day, work to eliminate stress. When this is not possible use some of the previous suggestions to help reduce the anxiety that accompanies stressful situations. MAKE SURE YOU:   Understand these instructions.  Will watch your condition.  Will get help right away if you are not doing well or get worse. Document Released: 11/09/2000 Document Revised: 02/04/2012 Document Reviewed: 12/10/2007 [facility] Patient Information 2015 [place], [place]. This information is not intended to replace advice given to you by your health care provider. Make sure you discuss any questions you have with your health care provider.

## 2015-01-16 NOTE — MAU Note (Signed)
Pt c/o shortness of breath at 1700 while lying in bed, tried to reposition self, no improvement.  Worsened with difficulty breathing and reports heart fluttering.  Denies asthma, but says she has had panic attacks prior that have felt like this.

## 2015-01-16 NOTE — MAU Provider Note (Signed)
History     CSN: [id number]  Arrival date and time: 01/16/15 4098   First Provider Initiated Contact with Patient 01/16/15 2040      No chief complaint on file.  HPI Pt is a 34 y.o. [id number] at [redacted]w[redacted]d with h/o cHTN and class B DM who presents with dyspnea and palpitations since this afternoon. Her mom had gall bladder surgery earlier today and they lost her on the table, they got her back but she is now intubated in the ICU at [facility] long. She denies any symptoms prior to today. Denies bleeding, LOF. Few tight contractions without overt pain this evening. Normal FM. States her glucose and blood pressure have been well controlled on labetalol, glyburide and insulin.  OB History    Gravida Para Term Preterm AB TAB SAB Ectopic Multiple Living   Past Medical History  Diagnosis Date  . Hypertension   . Diabetes mellitus   . Anxiety     panic attacks    Past Surgical History  Procedure Laterality Date  . Cesarean section      C/S x 2    Family History  Problem Relation Age of Onset  . Diabetes Paternal Grandfather   . Cancer Paternal Grandmother     liver  . Hypertension Father   . Diabetes Mother   . Hypertension Mother   . Diabetes Maternal Uncle   . Diabetes Maternal Grandmother   . Cancer Maternal Grandfather   . Diabetes Daughter     boarderline   . Asthma Daughter   . Bronchitis Daughter   . Bronchitis Son   . Bronchitis Daughter     History  Substance Use Topics  . Smoking status: Never Smoker   . Smokeless tobacco: Never Used  . Alcohol Use: No     Comment: occ    Allergies:  Allergies  Allergen Reactions  . Lisinopril Cough    cough    Prescriptions prior to admission  Medication Sig Dispense Refill Last Dose  . glyBURIDE (DIABETA) 5 MG tablet Take 2 tablets (10 mg total) by mouth 2 (two) times daily with a meal. 120 tablet 6 01/16/2015 at Unknown time  . insulin aspart (NOVOLOG) 100 UNIT/ML injection Inject 20 Units into the  skin 2 (two) times daily. 20 units in am 20 units in pm. (Patient taking differently: Inject 28 Units into the skin 2 (two) times daily. 20 units in am 20 units in pm.) 10 mL 12 01/16/2015 at Unknown time  . insulin NPH Human (HUMULIN N,NOVOLIN N) 100 UNIT/ML injection Inject 0.34 mLs (34 Units total) into the skin 2 (two) times daily before a meal. (Patient taking differently: Inject 42 Units into the skin 2 (two) times daily before a meal. ) 10 mL 6 01/16/2015 at Unknown time  . labetalol (NORMODYNE) 200 MG tablet Take 1 tablet (200 mg total) by mouth 2 (two) times daily. 180 tablet 2 01/16/2015 at 1100  . nystatin cream (MYCOSTATIN) Apply 1 application topically 2 (two) times daily.   Past Month at Unknown time  . Prenatal Multivit-Min-Fe-FA (PRENATAL VITAMINS PO) Take by mouth daily.   Past Week at Unknown time  . triamcinolone ointment (KENALOG) 0.1 % Apply 1 application topically 2 (two) times daily.   Past Month at Unknown time  . clindamycin (CLEOCIN) 300 MG capsule Take 1 capsule (300 mg total) by mouth 3 (three) times daily. (Patient not taking: Reported  on 01/03/2015) 30 capsule 0 Completed Course at Unknown time  . glucose blood test strip 1 each by Other route as needed. Use as instructed  4 times daily will use x 6 wks more   Taking  . INSULIN SYRINGE 1CC/29G 29G X 1/2" 1 ML MISC 1 Syringe by Does not apply route daily. 100 each prn Taking  . metroNIDAZOLE (FLAGYL) 500 MG tablet Take 1 tablet (500 mg total) by mouth 2 (two) times daily. (Patient not taking: Reported on 01/16/2015) 14 tablet 0 Taking  . sulfamethoxazole-trimethoprim (BACTRIM DS,SEPTRA DS) 800-160 MG per tablet Take 1 tablet by mouth 2 (two) times daily. (Patient not taking: Reported on 12/23/2014) 14 tablet 0 Not Taking    ROS  See HPI Physical Exam   Blood pressure 154/78, pulse 89, resp. rate 24, height 5\' 1"  (1.549 m), weight 276 lb (125.193 kg), last menstrual period 05/13/2014, SpO2 100 %.  Physical Exam  Nursing note  and vitals reviewed. Constitutional: She is oriented to person, place, and time. She appears well-developed and well-nourished. She appears distressed.  HENT:  Head: Normocephalic and atraumatic.  Eyes: Conjunctivae are normal. Right eye exhibits no discharge. Left eye exhibits no discharge. No scleral icterus.  Cardiovascular: Normal rate, regular rhythm, normal heart sounds and intact distal pulses.   No murmur heard. Respiratory: Breath sounds normal. No respiratory distress. She has no wheezes.  Slightly increased WOB  GI: Soft. Bowel sounds are normal. She exhibits no distension. There is no tenderness.  Neurological: She is alert and oriented to person, place, and time.  Skin: Skin is warm and dry. No rash noted. She is not diaphoretic.  Psychiatric: She has a normal mood and affect. Her behavior is normal.    MAU Course  Procedures  FHT: bl 140/mod var/+accels/-decels  Toco: occasional uterine irritability  Assessment and Plan  Dyspnea and palpitations following traumatic incident with her mother. Likely panic attack. Will give xanax and monitor.   Feeling better after ambien. Not contracting and FHT reactice. D/c home. Will rx ambien for poor sleep recently.  Beverely Lowdamo, Elena 01/16/2015, 9:16 PM   Seen also by me Agree with note Aviva SignsMarie L Williams, CNM

## 2015-01-18 ENCOUNTER — Other Ambulatory Visit: Payer: Medicaid Other | Admitting: Obstetrics & Gynecology

## 2015-01-18 ENCOUNTER — Encounter: Payer: Medicaid Other | Admitting: Obstetrics & Gynecology

## 2015-01-19 ENCOUNTER — Other Ambulatory Visit: Payer: Medicaid Other

## 2015-01-19 ENCOUNTER — Encounter: Payer: Self-pay | Admitting: Obstetrics and Gynecology

## 2015-01-19 ENCOUNTER — Ambulatory Visit (INDEPENDENT_AMBULATORY_CARE_PROVIDER_SITE_OTHER): Payer: Medicaid Other | Admitting: Obstetrics and Gynecology

## 2015-01-19 VITALS — BP 136/100

## 2015-01-19 DIAGNOSIS — O99213 Obesity complicating pregnancy, third trimester: Secondary | ICD-10-CM

## 2015-01-19 DIAGNOSIS — Z118 Encounter for screening for other infectious and parasitic diseases: Secondary | ICD-10-CM

## 2015-01-19 DIAGNOSIS — O09213 Supervision of pregnancy with history of pre-term labor, third trimester: Secondary | ICD-10-CM

## 2015-01-19 DIAGNOSIS — O24913 Unspecified diabetes mellitus in pregnancy, third trimester: Secondary | ICD-10-CM

## 2015-01-19 DIAGNOSIS — Z3685 Encounter for antenatal screening for Streptococcus B: Secondary | ICD-10-CM

## 2015-01-19 DIAGNOSIS — Z331 Pregnant state, incidental: Secondary | ICD-10-CM

## 2015-01-19 DIAGNOSIS — O09893 Supervision of other high risk pregnancies, third trimester: Secondary | ICD-10-CM

## 2015-01-19 DIAGNOSIS — Z1389 Encounter for screening for other disorder: Secondary | ICD-10-CM

## 2015-01-19 DIAGNOSIS — Z1159 Encounter for screening for other viral diseases: Secondary | ICD-10-CM

## 2015-01-19 LAB — OB RESULTS CONSOLE GC/CHLAMYDIA
Chlamydia: NEGATIVE
GC PROBE AMP, GENITAL: NEGATIVE

## 2015-01-19 LAB — POCT URINALYSIS DIPSTICK
Glucose, UA: NEGATIVE
KETONES UA: NEGATIVE
Leukocytes, UA: NEGATIVE
Nitrite, UA: NEGATIVE
Protein, UA: NEGATIVE
RBC UA: NEGATIVE

## 2015-01-19 LAB — OB RESULTS CONSOLE GBS: GBS: NEGATIVE

## 2015-01-19 MED ORDER — HYDROXYPROGESTERONE CAPROATE 250 MG/ML IM OIL: 250.0000 mg | TOPICAL_OIL | Freq: Once | INTRAMUSCULAR | Status: DC

## 2015-01-19 NOTE — Progress Notes (Addendum)
I and D of right gluteal abscess done under local anesthesia,  Will rx keflex.   Fetal Surveillance Testing today:     High Risk Pregnancy Diagnosis(es):   DM2 CHTN,Hx PTB, Prior c/s for repeat and BTL  [id number] 7181w6d Estimated Date of Delivery: 02/17/15  Blood pressure 136/100, last menstrual period 05/13/2014.  Urinalysis: Negative   HPI: The patient is being seen today for ongoing management of DM chtn and abscess buttock. Today she reports severe paiin, right buttock   BP weight and urine results all reviewed and noted. Patient reports good fetal movement, denies any bleeding and no rupture of membranes symptoms or regular contractions.  Fundal Height:  53 Fetal Heart rate:  140 Edema:  Stable Gluteal abscess needs I&D.  Patient is without complaints other than noted in her HPI. All questions were answered.  All lab and sonogram results have been reviewed. Comments: normal nst reactive  Assessment:  1.  Pregnancy at 7181w6d,  Estimated Date of Delivery: 02/17/15 :  DM, HTN,                         2.  Gluteal abscess, I&D done                        3.  Reactive NST  Medication(s) Plans:  Keflex, continue insulin and oral agents  Treatment Plan:  Schedule c/s , to discuss with [facility]  Follow up in 2 days for appointment for high risk OB care, BPP.

## 2015-01-19 NOTE — Progress Notes (Signed)
Pt states that she has had decreased fetal movement today and that she has had pains that come pretty hard one after the other. Pt states that she did have a small gush of fluid this morning.

## 2015-01-20 ENCOUNTER — Other Ambulatory Visit: Payer: Self-pay | Admitting: Obstetrics and Gynecology

## 2015-01-20 DIAGNOSIS — O24913 Unspecified diabetes mellitus in pregnancy, third trimester: Secondary | ICD-10-CM

## 2015-01-20 DIAGNOSIS — O163 Unspecified maternal hypertension, third trimester: Secondary | ICD-10-CM

## 2015-01-20 DIAGNOSIS — O3663X1 Maternal care for excessive fetal growth, third trimester, fetus 1: Secondary | ICD-10-CM

## 2015-01-21 ENCOUNTER — Telehealth: Payer: Self-pay | Admitting: Obstetrics and Gynecology

## 2015-01-21 ENCOUNTER — Ambulatory Visit (INDEPENDENT_AMBULATORY_CARE_PROVIDER_SITE_OTHER): Payer: Medicaid Other

## 2015-01-21 DIAGNOSIS — O163 Unspecified maternal hypertension, third trimester: Secondary | ICD-10-CM

## 2015-01-21 DIAGNOSIS — O24913 Unspecified diabetes mellitus in pregnancy, third trimester: Secondary | ICD-10-CM

## 2015-01-21 DIAGNOSIS — O3663X1 Maternal care for excessive fetal growth, third trimester, fetus 1: Secondary | ICD-10-CM

## 2015-01-21 LAB — GC/CHLAMYDIA PROBE AMP
Chlamydia trachomatis, NAA: NEGATIVE
NEISSERIA GONORRHOEAE BY PCR: NEGATIVE

## 2015-01-21 NOTE — Addendum Note (Signed)
Addended by: Tilda BurrowFERGUSON, Brailee Riede V on: 01/21/2015 08:01 PM   Modules accepted: Orders

## 2015-01-21 NOTE — Progress Notes (Signed)
U/S(36+1wks)-Breech fetus, FHR- 142 bpm, Fluid WNL AFI-15.0cm, SDP-7.5cm, fundal Gr 1 plac. UA Doppler RI-0.63 & 0.56, BPP 8/8

## 2015-01-24 ENCOUNTER — Inpatient Hospital Stay (HOSPITAL_COMMUNITY): Payer: Medicaid Other | Admitting: Anesthesiology

## 2015-01-24 ENCOUNTER — Inpatient Hospital Stay (HOSPITAL_COMMUNITY)
Admission: AD | Admit: 2015-01-24 | Discharge: 2015-01-27 | DRG: 765 | Disposition: A | Discharge status: Home / Self Care | Payer: Medicaid Other | Source: Ambulatory Visit | Attending: Obstetrics and Gynecology | Admitting: Obstetrics and Gynecology

## 2015-01-24 ENCOUNTER — Telehealth: Payer: Self-pay | Admitting: *Deleted

## 2015-01-24 ENCOUNTER — Encounter (HOSPITAL_COMMUNITY)
Admission: AD | Disposition: A | Discharge status: Home / Self Care | Payer: Self-pay | Source: Ambulatory Visit | Attending: Obstetrics and Gynecology

## 2015-01-24 ENCOUNTER — Encounter (HOSPITAL_COMMUNITY): Payer: Self-pay | Admitting: *Deleted

## 2015-01-24 DIAGNOSIS — O09213 Supervision of pregnancy with history of pre-term labor, third trimester: Secondary | ICD-10-CM | POA: Diagnosis not present

## 2015-01-24 DIAGNOSIS — E119 Type 2 diabetes mellitus without complications: Secondary | ICD-10-CM | POA: Diagnosis present

## 2015-01-24 DIAGNOSIS — O34219 Maternal care for unspecified type scar from previous cesarean delivery: Secondary | ICD-10-CM

## 2015-01-24 DIAGNOSIS — O1413 Severe pre-eclampsia, third trimester: Secondary | ICD-10-CM | POA: Diagnosis present

## 2015-01-24 DIAGNOSIS — Z6841 Body Mass Index (BMI) 40.0 and over, adult: Secondary | ICD-10-CM

## 2015-01-24 DIAGNOSIS — O113 Pre-existing hypertension with pre-eclampsia, third trimester: Secondary | ICD-10-CM | POA: Diagnosis present

## 2015-01-24 DIAGNOSIS — Z794 Long term (current) use of insulin: Secondary | ICD-10-CM

## 2015-01-24 DIAGNOSIS — O99214 Obesity complicating childbirth: Secondary | ICD-10-CM | POA: Diagnosis present

## 2015-01-24 DIAGNOSIS — O2412 Pre-existing diabetes mellitus, type 2, in childbirth: Secondary | ICD-10-CM | POA: Diagnosis present

## 2015-01-24 DIAGNOSIS — O09893 Supervision of other high risk pregnancies, third trimester: Secondary | ICD-10-CM

## 2015-01-24 DIAGNOSIS — L732 Hidradenitis suppurativa: Secondary | ICD-10-CM

## 2015-01-24 DIAGNOSIS — O321XX0 Maternal care for breech presentation, not applicable or unspecified: Secondary | ICD-10-CM | POA: Diagnosis present

## 2015-01-24 DIAGNOSIS — Z3A36 36 weeks gestation of pregnancy: Secondary | ICD-10-CM | POA: Diagnosis present

## 2015-01-24 DIAGNOSIS — Z79899 Other long term (current) drug therapy: Secondary | ICD-10-CM

## 2015-01-24 DIAGNOSIS — Z302 Encounter for sterilization: Secondary | ICD-10-CM | POA: Diagnosis not present

## 2015-01-24 DIAGNOSIS — Z98891 History of uterine scar from previous surgery: Secondary | ICD-10-CM

## 2015-01-24 HISTORY — DX: Obesity, unspecified: E66.9

## 2015-01-24 LAB — COMPREHENSIVE METABOLIC PANEL
ALK PHOS: 128 U/L — AB (ref 39–117)
ALT: 11 U/L (ref 0–35)
ANION GAP: 4 — AB (ref 5–15)
AST: 13 U/L (ref 0–37)
Albumin: 2.3 g/dL — ABNORMAL LOW (ref 3.5–5.2)
BILIRUBIN TOTAL: 0.3 mg/dL (ref 0.3–1.2)
BUN: 8 mg/dL (ref 6–23)
CHLORIDE: 109 mmol/L (ref 96–112)
CO2: 20 mmol/L (ref 19–32)
Calcium: 8.4 mg/dL (ref 8.4–10.5)
Creatinine, Ser: 0.52 mg/dL (ref 0.50–1.10)
GFR calc non Af Amer: >90 mL/min (ref >90)
GLUCOSE: 139 mg/dL — AB (ref 70–99)
POTASSIUM: 3.6 mmol/L (ref 3.5–5.1)
Sodium: 133 mmol/L — ABNORMAL LOW (ref 135–145)
Total Protein: 6 g/dL (ref 6.0–8.3)

## 2015-01-24 LAB — GLUCOSE, CAPILLARY
GLUCOSE-CAPILLARY: 49 mg/dL — AB (ref 70–99)
GLUCOSE-CAPILLARY: 123 mg/dL — AB (ref 70–99)
Glucose-Capillary: 50 mg/dL — ABNORMAL LOW (ref 70–99)
Glucose-Capillary: 139 mg/dL — ABNORMAL HIGH (ref 70–99)

## 2015-01-24 LAB — CBC
HEMATOCRIT: 29.8 % — AB (ref 36.0–46.0)
HEMATOCRIT: 30.6 % — AB (ref 36.0–46.0)
Hemoglobin: 9.5 g/dL — ABNORMAL LOW (ref 12.0–15.0)
Hemoglobin: 9.8 g/dL — ABNORMAL LOW (ref 12.0–15.0)
MCH: 26 pg (ref 26.0–34.0)
MCH: 26.2 pg (ref 26.0–34.0)
MCHC: 31.9 g/dL (ref 30.0–36.0)
MCHC: 32 g/dL (ref 30.0–36.0)
MCV: 81.6 fL (ref 78.0–100.0)
MCV: 81.8 fL (ref 78.0–100.0)
Platelets: 191 10*3/uL (ref 150–400)
Platelets: 234 10*3/uL (ref 150–400)
RBC: 3.65 MIL/uL — ABNORMAL LOW (ref 3.87–5.11)
RBC: 3.74 MIL/uL — ABNORMAL LOW (ref 3.87–5.11)
RDW: 17.6 % — ABNORMAL HIGH (ref 11.5–15.5)
RDW: 17.6 % — ABNORMAL HIGH (ref 11.5–15.5)
WBC: 7.7 10*3/uL (ref 4.0–10.5)
WBC: 7.9 10*3/uL (ref 4.0–10.5)

## 2015-01-24 LAB — TYPE AND SCREEN
ABO/RH(D): B POS
Antibody Screen: NEGATIVE

## 2015-01-24 LAB — PROTEIN / CREATININE RATIO, URINE
CREATININE, URINE: 369 mg/dL
PROTEIN CREATININE RATIO: 0.15 (ref 0.00–0.15)
TOTAL PROTEIN, URINE: 56 mg/dL

## 2015-01-24 LAB — ABO/RH: ABO/RH(D): B POS

## 2015-01-24 LAB — CULTURE, BETA STREP (GROUP B ONLY): Strep Gp B Culture: NEGATIVE

## 2015-01-24 LAB — GLUCOSE, RANDOM: Glucose, Bld: 130 mg/dL — ABNORMAL HIGH (ref 70–99)

## 2015-01-24 SURGERY — Surgical Case
Anesthesia: Spinal

## 2015-01-24 MED ORDER — SODIUM CHLORIDE 0.9 % IR SOLN
Status: DC | PRN
  Administered 2015-01-24: 1000 mL

## 2015-01-24 MED ORDER — LACTATED RINGERS IV SOLN: INTRAVENOUS | Status: DC | PRN

## 2015-01-24 MED ORDER — MAGNESIUM SULFATE 40 G IN LACTATED RINGERS - SIMPLE
2.0000 g/h | INTRAVENOUS | Status: DC
  Administered 2015-01-24 – 2015-01-25 (×3): 2 g/h via INTRAVENOUS
  Filled 2015-01-24 (×2): qty 500

## 2015-01-24 MED ORDER — BUPIVACAINE IN DEXTROSE 0.75-8.25 % IT SOLN: INTRATHECAL | Status: DC | PRN

## 2015-01-24 MED ORDER — SIMETHICONE 80 MG PO CHEW
80.0000 mg | CHEWABLE_TABLET | ORAL | Status: DC
  Administered 2015-01-24 – 2015-01-26 (×3): 80 mg via ORAL
  Filled 2015-01-24 (×3): qty 1

## 2015-01-24 MED ORDER — FENTANYL CITRATE 0.05 MG/ML IJ SOLN
INTRAMUSCULAR | Status: AC
  Filled 2015-01-24: qty 2

## 2015-01-24 MED ORDER — DIPHENHYDRAMINE HCL 25 MG PO CAPS: 25.0000 mg | ORAL_CAPSULE | ORAL | Status: DC | PRN

## 2015-01-24 MED ORDER — ERYTHROMYCIN 5 MG/GM OP OINT
TOPICAL_OINTMENT | OPHTHALMIC | Status: AC
  Filled 2015-01-24: qty 1

## 2015-01-24 MED ORDER — MAGNESIUM SULFATE BOLUS VIA INFUSION
4.0000 g | Freq: Once | INTRAVENOUS | Status: AC
  Administered 2015-01-24: 4 g via INTRAVENOUS
  Filled 2015-01-24: qty 500

## 2015-01-24 MED ORDER — PHENYLEPHRINE HCL 10 MG/ML IJ SOLN
INTRAMUSCULAR | Status: DC | PRN
  Administered 2015-01-24 (×3): 40 ug via INTRAVENOUS
  Administered 2015-01-24: 80 ug via INTRAVENOUS
  Administered 2015-01-24: 40 ug via INTRAVENOUS

## 2015-01-24 MED ORDER — IBUPROFEN 600 MG PO TABS
600.0000 mg | ORAL_TABLET | Freq: Four times a day (QID) | ORAL | Status: DC
  Administered 2015-01-25 – 2015-01-27 (×10): 600 mg via ORAL
  Filled 2015-01-24 (×10): qty 1

## 2015-01-24 MED ORDER — DIPHENHYDRAMINE HCL 50 MG/ML IJ SOLN
12.5000 mg | INTRAMUSCULAR | Status: DC | PRN
  Administered 2015-01-24: 12.5 mg via INTRAVENOUS
  Filled 2015-01-24: qty 1

## 2015-01-24 MED ORDER — OXYTOCIN 10 UNIT/ML IJ SOLN
INTRAMUSCULAR | Status: AC
  Filled 2015-01-24: qty 4

## 2015-01-24 MED ORDER — OXYTOCIN 40 UNITS IN LACTATED RINGERS INFUSION - SIMPLE MED: 62.5000 mL/h | INTRAVENOUS | Status: AC

## 2015-01-24 MED ORDER — KETOROLAC TROMETHAMINE 30 MG/ML IJ SOLN: 30.0000 mg | Freq: Four times a day (QID) | INTRAMUSCULAR | Status: AC | PRN

## 2015-01-24 MED ORDER — DEXTROSE 5 % IV SOLN
3.0000 g | INTRAVENOUS | Status: DC
  Filled 2015-01-24 (×2): qty 3000

## 2015-01-24 MED ORDER — NALBUPHINE HCL 10 MG/ML IJ SOLN: 5.0000 mg | Freq: Once | INTRAMUSCULAR | Status: DC | PRN

## 2015-01-24 MED ORDER — MAGNESIUM SULFATE 40 G IN LACTATED RINGERS - SIMPLE
2.0000 g/h | INTRAVENOUS | Status: DC
  Filled 2015-01-24: qty 500

## 2015-01-24 MED ORDER — ONDANSETRON HCL 4 MG/2ML IJ SOLN: 4.0000 mg | Freq: Three times a day (TID) | INTRAMUSCULAR | Status: DC | PRN

## 2015-01-24 MED ORDER — SCOPOLAMINE 1 MG/3DAYS TD PT72
1.0000 | MEDICATED_PATCH | Freq: Once | TRANSDERMAL | Status: DC
  Filled 2015-01-24: qty 1

## 2015-01-24 MED ORDER — MENTHOL 3 MG MT LOZG
1.0000 | LOZENGE | OROMUCOSAL | Status: DC | PRN
Start: 2015-01-24 — End: 2015-01-27

## 2015-01-24 MED ORDER — FENTANYL CITRATE 0.05 MG/ML IJ SOLN
INTRAMUSCULAR | Status: DC | PRN
  Administered 2015-01-24: 25 ug via INTRAVENOUS

## 2015-01-24 MED ORDER — SENNOSIDES-DOCUSATE SODIUM 8.6-50 MG PO TABS
2.0000 | ORAL_TABLET | ORAL | Status: DC
  Administered 2015-01-24 – 2015-01-26 (×3): 2 via ORAL
  Filled 2015-01-24 (×3): qty 2

## 2015-01-24 MED ORDER — MEPERIDINE HCL 25 MG/ML IJ SOLN: 6.2500 mg | INTRAMUSCULAR | Status: DC | PRN

## 2015-01-24 MED ORDER — DIPHENHYDRAMINE HCL 25 MG PO CAPS
25.0000 mg | ORAL_CAPSULE | ORAL | Status: DC | PRN
  Filled 2015-01-24: qty 1

## 2015-01-24 MED ORDER — OXYCODONE-ACETAMINOPHEN 5-325 MG PO TABS
1.0000 | ORAL_TABLET | ORAL | Status: DC | PRN
  Administered 2015-01-25 – 2015-01-26 (×4): 1 via ORAL
  Filled 2015-01-24 (×4): qty 1

## 2015-01-24 MED ORDER — DIBUCAINE 1 % RE OINT: 1.0000 "application " | TOPICAL_OINTMENT | RECTAL | Status: DC | PRN

## 2015-01-24 MED ORDER — ONDANSETRON HCL 4 MG/2ML IJ SOLN: 4.0000 mg | INTRAMUSCULAR | Status: DC | PRN

## 2015-01-24 MED ORDER — SIMETHICONE 80 MG PO CHEW
80.0000 mg | CHEWABLE_TABLET | Freq: Three times a day (TID) | ORAL | Status: DC
  Administered 2015-01-25 – 2015-01-27 (×7): 80 mg via ORAL
  Filled 2015-01-24 (×7): qty 1

## 2015-01-24 MED ORDER — OXYTOCIN 10 UNIT/ML IJ SOLN
40.0000 [IU] | INTRAVENOUS | Status: DC | PRN
  Administered 2015-01-24: 40 [IU] via INTRAVENOUS

## 2015-01-24 MED ORDER — DEXTROSE 5 % IV SOLN
3.0000 g | INTRAVENOUS | Status: AC
  Administered 2015-01-24: 3 g via INTRAVENOUS
  Filled 2015-01-24 (×3): qty 3000

## 2015-01-24 MED ORDER — OXYCODONE-ACETAMINOPHEN 5-325 MG PO TABS
2.0000 | ORAL_TABLET | ORAL | Status: DC | PRN
  Administered 2015-01-24 – 2015-01-27 (×7): 2 via ORAL
  Filled 2015-01-24 (×7): qty 2

## 2015-01-24 MED ORDER — MORPHINE SULFATE (PF) 0.5 MG/ML IJ SOLN
INTRAMUSCULAR | Status: DC | PRN
  Administered 2015-01-24: .2 mg via EPIDURAL

## 2015-01-24 MED ORDER — SODIUM CHLORIDE 0.9 % IJ SOLN
3.0000 mL | INTRAMUSCULAR | Status: DC | PRN
  Administered 2015-01-25: 3 mL via INTRAVENOUS
  Filled 2015-01-24: qty 3

## 2015-01-24 MED ORDER — DEXTROSE IN LACTATED RINGERS 5 % IV SOLN
INTRAVENOUS | Status: DC
  Administered 2015-01-24: 17:00:00 via INTRAVENOUS

## 2015-01-24 MED ORDER — TETANUS-DIPHTH-ACELL PERTUSSIS 5-2.5-18.5 LF-MCG/0.5 IM SUSP
0.5000 mL | Freq: Once | INTRAMUSCULAR | Status: AC
  Administered 2015-01-26: 0.5 mL via INTRAMUSCULAR
  Filled 2015-01-24 (×2): qty 0.5

## 2015-01-24 MED ORDER — BUPIVACAINE IN DEXTROSE 0.75-8.25 % IT SOLN
INTRATHECAL | Status: DC | PRN
  Administered 2015-01-24: 1.2 mL via INTRATHECAL

## 2015-01-24 MED ORDER — HYDROMORPHONE HCL 1 MG/ML IJ SOLN: 0.2500 mg | INTRAMUSCULAR | Status: DC | PRN

## 2015-01-24 MED ORDER — NALBUPHINE HCL 10 MG/ML IJ SOLN
INTRAMUSCULAR | Status: AC
  Filled 2015-01-24: qty 1

## 2015-01-24 MED ORDER — NALOXONE HCL 1 MG/ML IJ SOLN
1.0000 ug/kg/h | INTRAVENOUS | Status: DC | PRN
  Filled 2015-01-24: qty 2

## 2015-01-24 MED ORDER — GLYBURIDE 5 MG PO TABS
5.0000 mg | ORAL_TABLET | Freq: Two times a day (BID) | ORAL | Status: DC
  Administered 2015-01-25 – 2015-01-27 (×5): 5 mg via ORAL
  Filled 2015-01-24 (×5): qty 1

## 2015-01-24 MED ORDER — KETOROLAC TROMETHAMINE 30 MG/ML IJ SOLN
30.0000 mg | Freq: Four times a day (QID) | INTRAMUSCULAR | Status: DC | PRN
Start: 2015-01-24 — End: 2015-01-24

## 2015-01-24 MED ORDER — NALBUPHINE HCL 10 MG/ML IJ SOLN
5.0000 mg | INTRAMUSCULAR | Status: DC | PRN
  Administered 2015-01-25 (×2): 5 mg via INTRAVENOUS
  Filled 2015-01-24 (×2): qty 1

## 2015-01-24 MED ORDER — IBUPROFEN 600 MG PO TABS: 600.0000 mg | ORAL_TABLET | Freq: Four times a day (QID) | ORAL | Status: DC | PRN

## 2015-01-24 MED ORDER — LACTATED RINGERS IV SOLN
INTRAVENOUS | Status: DC | PRN
  Administered 2015-01-24: 19:00:00 via INTRAVENOUS

## 2015-01-24 MED ORDER — LABETALOL HCL 5 MG/ML IV SOLN
INTRAVENOUS | Status: DC | PRN
  Administered 2015-01-24: 5 mg via INTRAVENOUS

## 2015-01-24 MED ORDER — FENTANYL CITRATE 0.05 MG/ML IJ SOLN
25.0000 ug | INTRAMUSCULAR | Status: DC | PRN
  Administered 2015-01-24: 25 ug via INTRAVENOUS

## 2015-01-24 MED ORDER — LANOLIN HYDROUS EX OINT: 1.0000 "application " | TOPICAL_OINTMENT | CUTANEOUS | Status: DC | PRN

## 2015-01-24 MED ORDER — NALBUPHINE HCL 10 MG/ML IJ SOLN: 5.0000 mg | Freq: Once | INTRAMUSCULAR | Status: AC | PRN

## 2015-01-24 MED ORDER — LABETALOL HCL 200 MG PO TABS
200.0000 mg | ORAL_TABLET | Freq: Two times a day (BID) | ORAL | Status: DC
  Administered 2015-01-24 – 2015-01-26 (×5): 200 mg via ORAL
  Filled 2015-01-24 (×6): qty 1

## 2015-01-24 MED ORDER — SCOPOLAMINE 1 MG/3DAYS TD PT72
MEDICATED_PATCH | TRANSDERMAL | Status: AC
  Filled 2015-01-24: qty 1

## 2015-01-24 MED ORDER — WITCH HAZEL-GLYCERIN EX PADS: 1.0000 "application " | MEDICATED_PAD | CUTANEOUS | Status: DC | PRN

## 2015-01-24 MED ORDER — DIPHENHYDRAMINE HCL 50 MG/ML IJ SOLN: 12.5000 mg | INTRAMUSCULAR | Status: DC | PRN

## 2015-01-24 MED ORDER — DEXTROSE 5 % IN LACTATED RINGERS IV BOLUS
500.0000 mL | Freq: Once | INTRAVENOUS | Status: AC
  Administered 2015-01-24: 500 mL via INTRAVENOUS

## 2015-01-24 MED ORDER — NALBUPHINE HCL 10 MG/ML IJ SOLN: 5.0000 mg | INTRAMUSCULAR | Status: DC | PRN

## 2015-01-24 MED ORDER — PHENYLEPHRINE 8 MG IN D5W 100 ML (0.08MG/ML) PREMIX OPTIME
INJECTION | INTRAVENOUS | Status: DC | PRN
Start: 2015-01-24 — End: 2015-01-24
  Administered 2015-01-24: 80 ug/min via INTRAVENOUS

## 2015-01-24 MED ORDER — SCOPOLAMINE 1 MG/3DAYS TD PT72
MEDICATED_PATCH | TRANSDERMAL | Status: DC | PRN
  Administered 2015-01-24: 1 via TRANSDERMAL

## 2015-01-24 MED ORDER — ONDANSETRON HCL 4 MG PO TABS: 4.0000 mg | ORAL_TABLET | ORAL | Status: DC | PRN

## 2015-01-24 MED ORDER — SIMETHICONE 80 MG PO CHEW
80.0000 mg | CHEWABLE_TABLET | ORAL | Status: DC | PRN
  Administered 2015-01-27: 80 mg via ORAL
  Filled 2015-01-24: qty 1

## 2015-01-24 MED ORDER — NALOXONE HCL 1 MG/ML IJ SOLN: 1.0000 ug/kg/h | INTRAVENOUS | Status: DC | PRN

## 2015-01-24 MED ORDER — PROMETHAZINE HCL 25 MG/ML IJ SOLN: 6.2500 mg | INTRAMUSCULAR | Status: DC | PRN

## 2015-01-24 MED ORDER — DIPHENHYDRAMINE HCL 25 MG PO CAPS
25.0000 mg | ORAL_CAPSULE | Freq: Four times a day (QID) | ORAL | Status: DC | PRN
  Administered 2015-01-25 – 2015-01-26 (×2): 25 mg via ORAL
  Filled 2015-01-24 (×2): qty 1

## 2015-01-24 MED ORDER — NALBUPHINE HCL 10 MG/ML IJ SOLN
5.0000 mg | INTRAMUSCULAR | Status: DC | PRN
  Administered 2015-01-24: 5 mg via SUBCUTANEOUS

## 2015-01-24 MED ORDER — NALOXONE HCL 0.4 MG/ML IJ SOLN: 0.4000 mg | INTRAMUSCULAR | Status: DC | PRN

## 2015-01-24 MED ORDER — LABETALOL HCL 5 MG/ML IV SOLN
INTRAVENOUS | Status: AC
  Filled 2015-01-24: qty 4

## 2015-01-24 MED ORDER — PHENYLEPHRINE 8 MG IN D5W 100 ML (0.08MG/ML) PREMIX OPTIME
INJECTION | INTRAVENOUS | Status: AC
  Filled 2015-01-24: qty 100

## 2015-01-24 MED ORDER — ONDANSETRON HCL 4 MG/2ML IJ SOLN
INTRAMUSCULAR | Status: DC | PRN
  Administered 2015-01-24: 4 mg via INTRAVENOUS

## 2015-01-24 MED ORDER — KETOROLAC TROMETHAMINE 30 MG/ML IJ SOLN: 30.0000 mg | Freq: Four times a day (QID) | INTRAMUSCULAR | Status: DC | PRN

## 2015-01-24 MED ORDER — SODIUM CHLORIDE 0.9 % IJ SOLN
INTRAMUSCULAR | Status: AC
  Filled 2015-01-24: qty 3

## 2015-01-24 MED ORDER — CITRIC ACID-SODIUM CITRATE 334-500 MG/5ML PO SOLN
ORAL | Status: AC
  Administered 2015-01-24: 30 mL
  Filled 2015-01-24: qty 15

## 2015-01-24 MED ORDER — SODIUM CHLORIDE 0.9 % IJ SOLN: 3.0000 mL | INTRAMUSCULAR | Status: DC | PRN

## 2015-01-24 MED ORDER — MORPHINE SULFATE 0.5 MG/ML IJ SOLN
INTRAMUSCULAR | Status: AC
  Filled 2015-01-24: qty 10

## 2015-01-24 MED ORDER — LIDOCAINE HCL (PF) 1 % IJ SOLN
INTRAMUSCULAR | Status: AC
  Filled 2015-01-24: qty 5

## 2015-01-24 MED ORDER — ONDANSETRON HCL 4 MG/2ML IJ SOLN
INTRAMUSCULAR | Status: AC
  Filled 2015-01-24: qty 2

## 2015-01-24 MED ORDER — LACTATED RINGERS IV SOLN
INTRAVENOUS | Status: DC
  Administered 2015-01-25 (×2): via INTRAVENOUS

## 2015-01-24 MED ORDER — PRENATAL MULTIVITAMIN CH
1.0000 | ORAL_TABLET | Freq: Every day | ORAL | Status: DC
  Administered 2015-01-25 – 2015-01-27 (×3): 1 via ORAL
  Filled 2015-01-24 (×3): qty 1

## 2015-01-24 SURGICAL SUPPLY — 27 items
CLAMP CORD UMBIL (MISCELLANEOUS) IMPLANT
CONTAINER PREFILL 10% NBF 15ML (MISCELLANEOUS) IMPLANT
DRAPE SHEET LG 3/4 BI-LAMINATE (DRAPES) IMPLANT
DRESSING DISP NPWT PICO 4X16 (MISCELLANEOUS) ×2 IMPLANT
DRSG OPSITE POSTOP 4X10 (GAUZE/BANDAGES/DRESSINGS) ×2 IMPLANT
DURAPREP 26ML APPLICATOR (WOUND CARE) ×2 IMPLANT
ELECT REM PT RETURN 9FT ADLT (ELECTROSURGICAL) ×2
ELECTRODE REM PT RTRN 9FT ADLT (ELECTROSURGICAL) ×1 IMPLANT
EXTRACTOR VACUUM M CUP 4 TUBE (SUCTIONS) IMPLANT
GLOVE BIOGEL PI IND STRL 6.5 (GLOVE) ×1 IMPLANT
GLOVE BIOGEL PI INDICATOR 6.5 (GLOVE) ×1
GLOVE SURG SS PI 6.0 STRL IVOR (GLOVE) ×2 IMPLANT
GOWN STRL REUS W/TWL LRG LVL3 (GOWN DISPOSABLE) ×4 IMPLANT
KIT ABG SYR 3ML LUER SLIP (SYRINGE) IMPLANT
NEEDLE HYPO 25X5/8 SAFETYGLIDE (NEEDLE) IMPLANT
NS IRRIG 1000ML POUR BTL (IV SOLUTION) ×2 IMPLANT
PACK C SECTION WH (CUSTOM PROCEDURE TRAY) ×2 IMPLANT
PAD OB MATERNITY 4.3X12.25 (PERSONAL CARE ITEMS) ×2 IMPLANT
RTRCTR C-SECT PINK 25CM LRG (MISCELLANEOUS) IMPLANT
SEPRAFILM MEMBRANE 5X6 (MISCELLANEOUS) IMPLANT
STAPLER VISISTAT 35W (STAPLE) IMPLANT
SUT PLAIN 0 NONE (SUTURE) ×2 IMPLANT
SUT PLAIN 2 0 XLH (SUTURE) ×4 IMPLANT
SUT VIC AB 0 CT1 36 (SUTURE) ×8 IMPLANT
SUT VIC AB 4-0 KS 27 (SUTURE) ×2 IMPLANT
TOWEL OR 17X24 6PK STRL BLUE (TOWEL DISPOSABLE) ×2 IMPLANT
TRAY FOLEY CATH 14FR (SET/KITS/TRAYS/PACK) ×2 IMPLANT

## 2015-01-24 NOTE — Anesthesia Postprocedure Evaluation (Signed)
  Anesthesia Post-op Note  Patient: Theresa Malone  Procedure(s) Performed: Procedure(s): CESAREAN SECTION (N/A)  Patient Location: PACU  Anesthesia Type:Spinal  Level of Consciousness: awake  Airway and Oxygen Therapy: Patient Spontanous Breathing  Post-op Pain: mild  Post-op Assessment: Post-op Vital signs reviewed, Patient's Cardiovascular Status Stable, Respiratory Function Stable, Patent Airway, No signs of Nausea or vomiting and Pain level controlled  Post-op Vital Signs: Reviewed and stable  Last Vitals:  Filed Vitals:   01/24/15 2145  BP: 168/96  Pulse: 84  Temp:   Resp: 19    Complications: No apparent anesthesia complications

## 2015-01-24 NOTE — Progress Notes (Signed)
CRITICAL VALUE ALERT  Critical value received:  CBG  Date of notification:  01/24/2015  Time of notification:  1759  Critical value read back:Yes.    Nurse who received alert:  Delrae SawyersS Tahjanae Blankenburg RN   MD notified (1st page):  1800  Time of first page:    MD notified (2nd page):  Time of second page:  Responding MD:  Dr Jolayne Pantheronstant  Time MD responded:

## 2015-01-24 NOTE — Op Note (Signed)
Theresa Malone PROCEDURE DATE: 01/24/2015  PREOPERATIVE DIAGNOSIS: Intrauterine pregnancy at  [redacted]w[redacted]d weeks gestation with severe preeclampsia; previous uterine incision kerr x2 and desire for permanent sterilization  POSTOPERATIVE DIAGNOSIS: The same  PROCEDURE:     Cesarean Section and Bilateral Tubal Sterilization using Pomeroy method   SURGEON:  Dr. Gigi Gin Alinda Egolf  ASSISTANT: none  INDICATIONS: Theresa Malone is a 34 y.o. [phone] at [redacted]w[redacted]d with severe preeclampsia scheduled for cesarean section secondary to previous uterine incision kerr x2.  The risks of cesarean section discussed with the patient included but were not limited to: bleeding which may require transfusion or reoperation; infection which may require antibiotics; injury to bowel, bladder, ureters or other surrounding organs; injury to the fetus; need for additional procedures including hysterectomy in the event of a life-threatening hemorrhage; placental abnormalities wth subsequent pregnancies, incisional problems, thromboembolic phenomenon and other postoperative/anesthesia complications. The patient concurred with the proposed plan, giving informed written consent for the procedure.  Patient also desires permanent sterilization. Risks and benefits of procedure discussed with patient including permanence of method, bleeding, infection, injury to surrounding organs and need for additional procedures. Risk failure of 0.5-1% with increased risk of ectopic gestation if pregnancy occurs was also discussed with patient.  FINDINGS:  Viable female infant in frank breech presentation.  Apgars 2, 7 and 8.  Cord pH 6.96 Clear amniotic fluid.  Intact placenta, three vessel cord.  Normal uterus, fallopian tubes and ovaries bilaterally.  ANESTHESIA:    Spinal INTRAVENOUS FLUIDS:1300 ml ESTIMATED BLOOD LOSS: 750 ml URINE OUTPUT:  110 ml SPECIMENS: Placenta sent to L&D COMPLICATIONS: None immediate  PROCEDURE IN DETAIL:  The patient received  intravenous antibiotics and had sequential compression devices applied to her lower extremities while in the preoperative area.  She was then taken to the operating room where anesthesia was induced and was found to be adequate. A foley catheter was placed into her bladder and attached to Camyla Camposano gravity. She was then placed in a dorsal supine position with a leftward tilt, and prepped and draped in a sterile manner. After an adequate timeout was performed, a Pfannenstiel skin incision was made with scalpel and carried through to the underlying layer of fascia. The fascia was incised in the midline and this incision was extended bilaterally using the Mayo scissors. Kocher clamps were applied to the superior aspect of the fascial incision and the underlying rectus muscles were dissected off bluntly. A similar process was carried out on the inferior aspect of the facial incision. The rectus muscles were separated in the midline bluntly and the peritoneum was entered bluntly. The Alexis self-retaining retractor was introduced into the abdominal cavity. Attention was turned to the lower uterine segment where a bladder flap was created, and a transverse hysterotomy was made with a scalpel and extended bilaterally bluntly. The infant was successfully delivered, and cord was clamped and cut and infant was handed over to awaiting neonatology team. Uterine massage was then administered and the placenta delivered intact with three-vessel cord. The uterus was cleared of clot and debris.  The hysterotomy was closed with 0 Vicryl in a running locked fashion, and an imbricating layer was also placed with a 0 Vicryl. Overall, excellent hemostasis was noted. The pelvis copiously irrigated and cleared of all clot and debris. Hemostasis was confirmed on all surfaces. The patient's left fallopian tube was then identified, brought to the incision, and grasped with a Babcock clamp. The tube was then followed out to the fimbria. The  [employer]  clamp was then used to grasp the tube approximately 4 cm from the cornual region. A 3 cm segment of the tube was then ligated with free tie of plain gut suture, transected and excised. Good hemostasis was noted and the tube was returned to the abdomen. The right fallopian tube was then identified to its fimbriated end, ligated, and a 3 cm segment excised in a similar fashion. Excellent hemostasis was noted, and the tube returned to the abdomen. The peritoneum and the muscles were reapproximated using 0 vicryl interrupted stitches. The fascia was then closed using 0 Vicryl in a running locked fashion.  The subcutaneous layer was reapproximated with plain gut and the skin was closed in a subcuticular fashion using 3.0 Vicryl. The patient tolerated the procedure well. Sponge, lap, instrument and needle counts were correct x 2. She was taken to the recovery room in stable condition.    Misk Galentine,PEGGYMD  01/24/2015 8:27 PM

## 2015-01-24 NOTE — Progress Notes (Signed)
CRITICAL VALUE ALERT  Critical value received:  CBG 49  Date of notification:  01/24/2015  Time of notification:  1647  Critical value read back:Yes.    Nurse who received alert:  Delrae SawyersS Aksh Swart RN   MD notified (1st page):  [phone]  Time of first page:    MD notified (2nd page):  Time of second page:  Responding MD:  Dr Suezanne JacquetHenson  Time MD responded:  [phone]

## 2015-01-24 NOTE — Progress Notes (Signed)
Pt states she has been seeing stars  today

## 2015-01-24 NOTE — Telephone Encounter (Signed)
Pt c/o contractions 10-15 minutes apart, tingling in hands, dizziness. Pt is making groaning sounds while talking and [facility] [place]. Pt [facility]. Pt verbalized understanding.

## 2015-01-24 NOTE — Progress Notes (Signed)
[facility] and Mag Sulfate ordered.

## 2015-01-24 NOTE — Anesthesia Procedure Notes (Signed)
Spinal Patient location during procedure: OR Start time: 01/24/2015 7:08 PM End time: 01/24/2015 7:14 PM Staffing Anesthesiologist: Leilani AbleHATCHETT, Lashan Macias Performed by: anesthesiologist  Preanesthetic Checklist Completed: patient identified, surgical consent, pre-op evaluation, timeout performed, IV checked, risks and benefits discussed and monitors and equipment checked Spinal Block Patient position: sitting Prep: site prepped and draped and DuraPrep Patient monitoring: heart rate, cardiac monitor, continuous pulse ox and blood pressure Approach: midline Location: L3-4 Injection technique: single-shot Needle Needle type: Pencan  Needle gauge: 24 G Needle length: 9 cm Needle insertion depth: 8 cm Assessment Sensory level: T4 Additional Notes Neede 17G Tuohy X 1 to 7cm followed by 150mm  Sprotte to +CSF

## 2015-01-24 NOTE — H&P (Signed)
LABOR ADMISSION HISTORY AND PHYSICAL  Theresa Malone is a 34 y.o. female [phone] with IUP at [redacted]w[redacted]d by 7 week ultrasound presenting for "visual changes". Her pregnancy is complicated by Class B DM (on insulin and glyburide) and chronic hypertension that has been well controlled (SBP 120s-140s) on labetalol 200 mg BID. She presents to MAU today for evaluation of scotoma, right upper quadrant pain, and edema that has been progressively worsening over the weekend. Denies headache, chest pain, shortness of breath. +Fetal movement. Denies LOF, vaginal bleeding, vaginal discharge, or contractions. She plans on breast feeding.  Dating: By LMP consistent with 7 week ultrasound --->  Estimated Date of Delivery: 02/17/15  Sono:    , CWD, normal anatomy, breech presentation, 3842g, >97% EFW   Prenatal History/Complications: - Chronic hypertension on labetalol 200 mg BID - Class B DM on glyburide, insulin - Morbid obesity - History of preterm delivery, received 17P - Prior c-section x2, desires repeat  Past Medical History: Past Medical History  Diagnosis Date  . Hypertension   . Diabetes mellitus   . Anxiety     panic attacks  . Obesity     Past Surgical History: Past Surgical History  Procedure Laterality Date  . Cesarean section      C/S x 2    Obstetrical History: OB History    Gravida Para Term Preterm AB TAB SAB Ectopic Multiple Living   Social History: History   Social History  . Marital Status: Married    Spouse Name: N/A  . Number of Children: N/A  . Years of Education: N/A   Social History Main Topics  . Smoking status: Never Smoker   . Smokeless tobacco: Never Used  . Alcohol Use: No     Comment: occ  . Drug Use: No  . Sexual Activity: Yes    Birth Control/ Protection: None   Other Topics Concern  . None   Social History Narrative    Family History: Family History  Problem Relation Age of Onset  . Diabetes Paternal  Grandfather   . Cancer Paternal Grandmother     liver  . Hypertension Father   . Diabetes Mother   . Hypertension Mother   . Diabetes Maternal Uncle   . Diabetes Maternal Grandmother   . Cancer Maternal Grandfather   . Diabetes Daughter     boarderline   . Asthma Daughter   . Bronchitis Daughter   . Bronchitis Son   . Bronchitis Daughter     Allergies: Allergies  Allergen Reactions  . Lisinopril Cough    cough    Prescriptions prior to admission  Medication Sig Dispense Refill Last Dose  . glyBURIDE (DIABETA) 5 MG tablet Take 2 tablets (10 mg total) by mouth 2 (two) times daily with a meal. 120 tablet 6 01/24/2015 at Unknown time  . insulin aspart (NOVOLOG) 100 UNIT/ML injection Inject 20 Units into the skin 2 (two) times daily. 20 units in am 20 units in pm. (Patient taking differently: Inject 28 Units into the skin 2 (two) times daily. 20 units in am 20 units in pm.) 10 mL 12 01/23/2015 at Unknown time  . insulin NPH Human (HUMULIN N,NOVOLIN N) 100 UNIT/ML injection Inject 0.34 mLs (34 Units total) into the skin 2 (two) times daily before a meal. (Patient taking differently: Inject 42 Units into the skin 2 (two) times daily before a meal. ) 10 mL  6 01/23/2015 at Unknown time  . labetalol (NORMODYNE) 200 MG tablet Take 1 tablet (200 mg total) by mouth 2 (two) times daily. 180 tablet 2 01/24/2015 at Unknown time  . zolpidem (AMBIEN) 5 MG tablet Take 1 tablet (5 mg total) by mouth at bedtime as needed for sleep. 10 tablet 0 01/23/2015 at Unknown time  . clindamycin (CLEOCIN) 300 MG capsule Take 1 capsule (300 mg total) by mouth 3 (three) times daily. (Patient not taking: Reported on 01/24/2015) 30 capsule 0 Completed Course at Unknown time  . glucose blood test strip 1 each by Other route as needed. Use as instructed  4 times daily will use x 6 wks more   Taking  . INSULIN SYRINGE 1CC/29G 29G X 1/2" 1 ML MISC 1 Syringe by Does not apply route daily. 100 each prn Taking     Review of  Systems   All systems reviewed and negative except as stated in HPI  Blood pressure 172/101, pulse 93, temperature 98.7 F (37.1 C), temperature source Oral, resp. rate 20, weight 280 lb (127.007 kg), last menstrual period 05/13/2014, SpO2 100 %. General appearance: alert, cooperative and no distress Lungs: normal effort Heart: normal rate Abdomen: soft, non-tender; bowel sounds normal Extremities: Homans sign is negative, no sign of DVT DTR's 2+ Presentation: breech based on 35wk ultrasound Fetal monitoringBaseline: 150 bpm, Variability: Good {> 6 bpm), Accelerations: Non-reactive but appropriate for gestational age and Decelerations: Absent Uterine activityIrregular, rare  Dilation: Fingertip Effacement (%): Thick Station: Ballotable Exam by:: Sarajane Marek, RNC   Prenatal labs: ABO, Rh: B/POS/-- (08/19 1015) Antibody: NEG (08/19 1015) Rubella:   RPR: NON REAC (08/19 1015)  HBsAg: NEGATIVE (08/19 1015)  HIV: NONREACTIVE (08/19 1015)  GBS:    1 hr Glucola B GDM Genetic screening  Neg Anatomy US Normal   Results for orders placed or performed during the hospital encounter of 01/24/15 (from the past 24 hour(s))  Protein / creatinine ratio, urine   Collection Time: 01/24/15 12:08 PM  Result Value Ref Range   Creatinine, Urine 369.00 mg/dL   Total Protein, Urine 56 mg/dL   Protein Creatinine Ratio 0.15 0.00 - 0.15  CBC   Collection Time: 01/24/15  1:09 PM  Result Value Ref Range   WBC 7.7 4.0 - 10.5 K/uL   RBC 3.65 (L) 3.87 - 5.11 MIL/uL   Hemoglobin 9.5 (L) 12.0 - 15.0 g/dL   HCT 14.7 (L) 82.9 - 56.2 %   MCV 81.6 78.0 - 100.0 fL   MCH 26.0 26.0 - 34.0 pg   MCHC 31.9 30.0 - 36.0 g/dL   RDW 13.0 (H) 86.5 - 78.4 %   Platelets 191 150 - 400 K/uL  Comprehensive metabolic panel   Collection Time: 01/24/15  1:09 PM  Result Value Ref Range   Sodium 133 (L) 135 - 145 mmol/L   Potassium 3.6 3.5 - 5.1 mmol/L   Chloride 109 96 - 112 mmol/L   CO2 20 19 - 32 mmol/L   Glucose,  Bld 139 (H) 70 - 99 mg/dL   BUN 8 6 - 23 mg/dL   Creatinine, Ser 6.96 0.50 - 1.10 mg/dL   Calcium 8.4 8.4 - 29.5 mg/dL   Total Protein 6.0 6.0 - 8.3 g/dL   Albumin 2.3 (L) 3.5 - 5.2 g/dL   AST 13 0 - 37 U/L   ALT 11 0 - 35 U/L   Alkaline Phosphatase 128 (H) 39 - 117 U/L   Total Bilirubin 0.3 0.3 - 1.2  mg/dL   GFR calc non Af Amer >90 >90 mL/min   GFR calc Af Amer >90 >90 mL/min   Anion gap 4 (L) 5 - 15    Patient Active Problem List   Diagnosis Date Noted  . [redacted] weeks gestation of pregnancy   . Preterm labor   . Morbid obesity   . Obesity affecting pregnancy in third trimester, antepartum   . Noncompliance with diabetes treatment 10/18/2014  . Hidradenitis axillaris 09/15/2014  . Susceptible to varicella (non-immune), currently pregnant 07/24/2014  . Anemia affecting pregnancy, antepartum 07/24/2014  . Hidradenitis suppurativa 07/20/2014  . Supervision of other high-risk pregnancy 07/14/2014  . History of preterm delivery, currently pregnant 07/14/2014  . History of cesarean delivery, currently pregnant 07/14/2014  . Benign essential hypertension antepartum 03/03/2014  . Diabetes mellitus during pregnancy, antepartum 03/03/2014  . Morbid obesity with BMI of 45.0-49.9, adult 03/03/2014    Assessment: Theresa Malone is a 34 y.o. [phone] at 6160w4d here for chronic hypertension with superimposed preeclampsia with severe features. Given severe range blood pressures and visual changes, patient meets criteria for delivery.   #Preeclampsia with Severe Features.  - BPs consistently severe range with CNS symptoms (visual changes) - HELLP labs neg - UPC 0.15 - NPO since 10 am. Continue NPO status.  - Plan for repeat c-section at 1800 when patient will be NPO x 8 hours.  - Start magnesium sulfate for seizure ppx - IV antihypertensives as needed for BP control  #FWB: Cat I  #ID:  GBS neg #MOF: breast #MOC:need to ask  William DaltonMcEachern, Theresa Malone 01/24/2015, 4:00 PM

## 2015-01-24 NOTE — MAU Note (Signed)
Having contractions, every 10-15. No bleeding or leaking.  Has been seeing stars- started last night.  Pain in left leg and low back.

## 2015-01-24 NOTE — Transfer of Care (Signed)
Immediate Anesthesia Transfer of Care Note  Patient: Theresa CoyerKelly R Cashman  Procedure(s) Performed: Procedure(s): CESAREAN SECTION (N/A)  Patient Location: PACU  Anesthesia Type:Epidural  Level of Consciousness: awake, alert  and oriented  Airway & Oxygen Therapy: Patient Spontanous Breathing  Post-op Assessment: Report given to RN and Post -op Vital signs reviewed and stable  Post vital signs: Reviewed and stable  Last Vitals:  Filed Vitals:   01/24/15 1852  BP: 165/93  Pulse: 101  Temp:   Resp: 18    Complications: No apparent anesthesia complications

## 2015-01-24 NOTE — Anesthesia Preprocedure Evaluation (Signed)
Anesthesia Evaluation  Patient identified by MRN, date of birth, ID band Patient awake    Reviewed: Allergy & Precautions, H&P , NPO status , Patient's Chart, lab work & pertinent test results  Airway Mallampati: III  TM Distance: >3 FB Neck ROM: full    Dental no notable dental hx.    Pulmonary neg pulmonary ROS,    Pulmonary exam normal       Cardiovascular hypertension, Pt. on home beta blockers and Pt. on medications     Neuro/Psych negative neurological ROS  negative psych ROS   GI/Hepatic negative GI ROS, Neg liver ROS,   Endo/Other  diabetesMorbid obesity  Renal/GU negative Renal ROS     Musculoskeletal   Abdominal (+) + obese,   Peds  Hematology   Anesthesia Other Findings   Reproductive/Obstetrics (+) Pregnancy                             Anesthesia Physical Anesthesia Plan  ASA: III  Anesthesia Plan: Combined Spinal and Epidural   Post-op Pain Management:    Induction:   Airway Management Planned:   Additional Equipment:   Intra-op Plan:   Post-operative Plan:   Informed Consent: I have reviewed the patients History and Physical, chart, labs and discussed the procedure including the risks, benefits and alternatives for the proposed anesthesia with the patient or authorized representative who has indicated his/her understanding and acceptance.     Plan Discussed with: CRNA and Surgeon  Anesthesia Plan Comments:         Anesthesia Quick Evaluation

## 2015-01-24 NOTE — MAU Note (Signed)
Urine in lab 

## 2015-01-24 NOTE — Consult Note (Signed)
Neonatology Note:   Attendance at C-section:    I was asked by Dr. Jolayne Pantheronstant to attend this repeat C/S at 36 4/7 weeks due to known LGA infant, elevated BP, and breech presentation. The mother is a G4P3 B pos, GBS neg with known LGA infant, Class B DM (insulin, glyburide- poorly compliant), and chronic hypertension (Labetalol). ROM at delivery, fluid clear. Difficult extraction of head. Infant was floppy, pale, apneic, and had a HR about 40 at birth. We quickly bulb suctioned, then applied PPV. Her HR came up promptly and color improved. She began to breathe, then cry at about 2 minutes., and PPV was stopped. Tone remained low, but was improved by 10 minutes of life. O2 saturations normal in room air, lungs clear to auscultation. Ap 2/7/8. I spoke with her parents about the risk of hypoglycemia and the need to check blood glucose frequently. To CN to care of Pediatrician.   Doretha Souhristie C. Salem Mastrogiovanni, MD

## 2015-01-25 ENCOUNTER — Encounter (HOSPITAL_COMMUNITY): Payer: Self-pay | Admitting: Anesthesiology

## 2015-01-25 ENCOUNTER — Encounter (HOSPITAL_COMMUNITY): Payer: Self-pay | Admitting: Obstetrics and Gynecology

## 2015-01-25 ENCOUNTER — Encounter: Payer: Medicaid Other | Admitting: Obstetrics & Gynecology

## 2015-01-25 DIAGNOSIS — Z302 Encounter for sterilization: Secondary | ICD-10-CM

## 2015-01-25 DIAGNOSIS — Z98891 History of uterine scar from previous surgery: Secondary | ICD-10-CM

## 2015-01-25 LAB — RPR: RPR Ser Ql: NONREACTIVE

## 2015-01-25 LAB — CBC
HCT: 27.6 % — ABNORMAL LOW (ref 36.0–46.0)
Hemoglobin: 8.9 g/dL — ABNORMAL LOW (ref 12.0–15.0)
MCH: 26.4 pg (ref 26.0–34.0)
MCHC: 32.2 g/dL (ref 30.0–36.0)
MCV: 81.9 fL (ref 78.0–100.0)
PLATELETS: 172 10*3/uL (ref 150–400)
RBC: 3.37 MIL/uL — ABNORMAL LOW (ref 3.87–5.11)
RDW: 17.4 % — AB (ref 11.5–15.5)
WBC: 7.8 10*3/uL (ref 4.0–10.5)

## 2015-01-25 LAB — GLUCOSE, CAPILLARY
GLUCOSE-CAPILLARY: 139 mg/dL — AB (ref 70–99)
Glucose-Capillary: 108 mg/dL — ABNORMAL HIGH (ref 70–99)
Glucose-Capillary: 201 mg/dL — ABNORMAL HIGH (ref 70–99)

## 2015-01-25 LAB — BIRTH TISSUE RECOVERY COLLECTION (PLACENTA DONATION)

## 2015-01-25 MED ORDER — FUROSEMIDE 20 MG PO TABS
20.0000 mg | ORAL_TABLET | Freq: Once | ORAL | Status: AC
  Administered 2015-01-25: 20 mg via ORAL
  Filled 2015-01-25: qty 1

## 2015-01-25 MED ORDER — FUROSEMIDE 10 MG/ML IJ SOLN
40.0000 mg | Freq: Once | INTRAMUSCULAR | Status: AC
  Administered 2015-01-25: 40 mg via INTRAVENOUS
  Filled 2015-01-25: qty 4

## 2015-01-25 NOTE — Anesthesia Postprocedure Evaluation (Signed)
  Anesthesia Post-op Note  Patient: Theresa Malone  Procedure(s) Performed: Procedure(s): CESAREAN SECTION (N/A)  Patient Location: PACU  Anesthesia Type:Spinal  Level of Consciousness: awake, alert  and oriented  Airway and Oxygen Therapy: Patient Spontanous Breathing  Post-op Pain: none  Post-op Assessment: Post-op Vital signs reviewed, Patient's Cardiovascular Status Stable, Respiratory Function Stable, Adequate PO intake, Pain level controlled, No headache, No backache, No residual numbness and No residual motor weakness  Post-op Vital Signs: Reviewed and stable  Last Vitals:  Filed Vitals:   01/25/15 0820  BP:   Pulse:   Temp: 36.9 C  Resp: 18    Complications: No apparent anesthesia complications

## 2015-01-25 NOTE — Lactation Note (Signed)
This note was copied from the chart of Theresa Melchor AmourKelly Mcquinn. Lactation Consultation Note  Patient Name: Theresa Malone Reason for consult: Initial assessment NICU baby 20 hours of life, CGA 7961w4d. Mom has experience briefly nursing older children, and states that her milk came in well with each child. Mom has used DEBP once so far. Assisted mom to start pumping, and enc her to pump every three hours for 15 minutes. Enc mom to hand express after pumping, and given small bottles and instructions for use of stickers and labels. Mom given NICU brochure, aware of OP/BFSG, community resources, and [facility] after [facility]. [facility] and [facility]. Mom states that she has not been active with WIC. Discussed 2-week [facility]     Maternal Data Has patient been taught Hand Expression?: Yes (Per mom.) Does the patient have breastfeeding experience prior to this delivery?: Yes  Feeding    LATCH Score/Interventions                      Lactation Tools Discussed/Used Tools: Pump Breast pump type: Double-Electric Breast Pump Pump Review: Setup, frequency, and cleaning Initiated by:: JW Date initiated:: 01/25/15   Consult Status Consult Status: Follow-up Date: 01/26/15 Follow-up type: In-patient    Theresa Malone, Theresa Malone Malone, 4:39 PM

## 2015-01-25 NOTE — Progress Notes (Signed)
Inpatient Diabetes Program Recommendations  AACE/ADA: New Consensus Statement on Inpatient Glycemic Control (2013)  Target Ranges:  Prepandial:   less than 140 mg/dL      Peak postprandial:   less than 180 mg/dL (1-2 hours)      Critically ill patients:  140 - 180 mg/dL   Noted hypoglycemia yesterday, cbg's of 49mg /dL at [id number] and 50mg /dL an hour later. Unsure if any meds to control her glucose had been given earlier that day.  Following delivery, pts typically who have been on orals and insulins during pregnancy have a tremendous sensitivity to insulin (even with pre-existing dm. IIf glyburide does not control or actually causes hypoglycemia, please consider using a correction scale only per below: Inpatient Diabetes Program Recommendations Correction (SSI): May want to use the sensitive to moderate correction scale (from the glycemic control order set(, checking blood sugars tidwc and HS (No need to check post-prandials now) Oral Agents: Pt on glyburide 5 mg bid-less than home regimen without basal insulin . Hopefully will not cause hypoglycemia  Thank you Lenor CoffinAnn Aithana Kushner, RN, MSN, CDE  Diabetes Inpatient Program Office: [phone] Pager: [phone] 8:00 am to 5:00 pm

## 2015-01-25 NOTE — Progress Notes (Signed)
UR chart review completed.  

## 2015-01-25 NOTE — Progress Notes (Signed)
Subjective: Postpartum Day 1: Cesarean Delivery Patient reports incisional pain and tolerating PO.  BP controlled.  Has borderline urine output.  Objective: Vital signs in last 24 hours: Temp:  [98 F (36.7 C)-99 F (37.2 C)] 98.4 F (36.9 C) (03/01 0820) Pulse Rate:  [65-101] 65 (03/01 0900) Resp:  [10-21] 18 (03/01 0820) BP: (108-172)/(59-103) 108/61 mmHg (03/01 0900) SpO2:  [97 %-100 %] 100 % (03/01 0900) Weight:  [280 lb (127.007 kg)-292 lb (132.45 kg)] 292 lb (132.45 kg) (02/29 2237)  Physical Exam:  General: alert, cooperative and no distress Heart: regular rate, no murmur Lungs: clear to auscultation bilaterally, no wheezing. Lochia: appropriate Uterine Fundus: firm Incision: no significant drainage, no dehiscence DVT Evaluation: No evidence of DVT seen on physical exam. Negative Homan's sign.   Recent Labs  01/24/15 1635 01/25/15 0535  HGB 9.8* 8.9*  HCT 30.6* 27.6*    Assessment/Plan: Status post Cesarean section. Doing well postoperatively.  Continue current care. Lasix 20mg  to stimulate urine output. Continue glyburide and labetalol.  Theresa Malone 01/25/2015, 9:22 AM

## 2015-01-25 NOTE — Progress Notes (Signed)
Noted glyburide 5 mg given this am, then noted order for NPO. Called RN on AICU who states she is eating and will continue the glyburide 5 mg bid (see previous note with recommendations). [facility]   Thank you Lenor CoffinAnn Demon Volante, RN, MSN, CDE  Diabetes Inpatient Program Office: [phone] Pager: [phone] 8:00 am to 5:00 pm

## 2015-01-25 NOTE — Progress Notes (Signed)
Patient refused Tdap vaccine.  VIS provided to patient.  Stated she will think about it and review the education.  Stated to ask her about it again tomorrow.

## 2015-01-25 NOTE — Addendum Note (Signed)
Addendum  created 01/25/15 0847 by Shanon PayorSuzanne M Reed Dady, CRNA   Modules edited: Notes Section   Notes Section:  File: [id number]

## 2015-01-26 LAB — GLUCOSE, CAPILLARY
GLUCOSE-CAPILLARY: 83 mg/dL (ref 70–99)
Glucose-Capillary: 119 mg/dL — ABNORMAL HIGH (ref 70–99)
Glucose-Capillary: 126 mg/dL — ABNORMAL HIGH (ref 70–99)

## 2015-01-26 LAB — KLEIHAUER-BETKE STAIN
# VIALS RHIG: 1
Fetal Cells %: 0 %
QUANTITATION FETAL HEMOGLOBIN: 0 mL

## 2015-01-26 NOTE — Progress Notes (Signed)
Subjective: Postpartum Day 2: Cesarean Delivery Patient reports incisional pain, tolerating PO, + flatus and no problems voiding.    Objective: Vital signs in last 24 hours: Temp:  [97.8 F (36.6 C)-98.4 F (36.9 C)] 97.9 F (36.6 C) (03/02 0537) Pulse Rate:  [64-99] 86 (03/02 0537) Resp:  [18-20] 18 (03/02 0537) BP: (108-160)/(50-87) 135/74 mmHg (03/02 0620) SpO2:  [97 %-100 %] 100 % (03/02 0537) Weight:  [273 lb 4 oz (123.945 kg)] 273 lb 4 oz (123.945 kg) (03/02 0537)  Physical Exam:  General: alert, cooperative and no distress Lochia: appropriate Uterine Fundus: firm Incision: healing well, no significant drainage, no dehiscence, no significant erythema DVT Evaluation: No evidence of DVT seen on physical exam. Negative Homan's sign. No cords or calf tenderness. No significant calf/ankle edema.   Recent Labs  01/24/15 1635 01/25/15 0535  HGB 9.8* 8.9*  HCT 30.6* 27.6*    Assessment/Plan: Status post Cesarean section. Doing well postoperatively.  Continue current care. D/c tomorrow  Candelaria CelesteSTINSON, [place] JEHIEL 01/26/2015, 6:57 AM

## 2015-01-27 ENCOUNTER — Ambulatory Visit: Payer: Self-pay

## 2015-01-27 LAB — GLUCOSE, CAPILLARY
GLUCOSE-CAPILLARY: 126 mg/dL — AB (ref 70–99)
GLUCOSE-CAPILLARY: 105 mg/dL — AB (ref 70–99)

## 2015-01-27 MED ORDER — HYDROCHLOROTHIAZIDE 25 MG PO TABS: 25.0000 mg | ORAL_TABLET | Freq: Every day | ORAL | Status: DC

## 2015-01-27 MED ORDER — ZOLPIDEM TARTRATE 5 MG PO TABS
5.0000 mg | ORAL_TABLET | Freq: Every evening | ORAL | Status: DC | PRN
  Administered 2015-01-27: 5 mg via ORAL
  Filled 2015-01-27: qty 1

## 2015-01-27 MED ORDER — IBUPROFEN 600 MG PO TABS: 600.0000 mg | ORAL_TABLET | Freq: Four times a day (QID) | ORAL | Status: DC

## 2015-01-27 MED ORDER — OXYCODONE-ACETAMINOPHEN 5-325 MG PO TABS: 1.0000 | ORAL_TABLET | ORAL | Status: DC | PRN

## 2015-01-27 MED ORDER — LABETALOL HCL 200 MG PO TABS
400.0000 mg | ORAL_TABLET | Freq: Two times a day (BID) | ORAL | Status: DC
  Administered 2015-01-27: 400 mg via ORAL
  Filled 2015-01-27: qty 2

## 2015-01-27 MED ORDER — LABETALOL HCL 200 MG PO TABS: 200.0000 mg | ORAL_TABLET | Freq: Two times a day (BID) | ORAL | Status: DC

## 2015-01-27 MED ORDER — GLYBURIDE 5 MG PO TABS: 5.0000 mg | ORAL_TABLET | Freq: Two times a day (BID) | ORAL | Status: DC

## 2015-01-27 MED ORDER — HYDROCHLOROTHIAZIDE 25 MG PO TABS
25.0000 mg | ORAL_TABLET | Freq: Every day | ORAL | Status: DC
  Administered 2015-01-27: 25 mg via ORAL
  Filled 2015-01-27: qty 1

## 2015-01-27 NOTE — Lactation Note (Signed)
This note was copied from the chart of Theresa Malone. Lactation Consultation Note  Patient Name: Theresa Malone ZOXWR'UToday's Date: 01/27/2015 Reason for consult: Follow-up assessment;Pump rental NICU baby 66 hours of life, 6035w0d. WIC loaner given to mom, and [facility] Mom pumped prior to D/C, and is about to take EBM up to NICU. Mom is starting to get transitional milk. Mom aware of OP/BFSG and LC phone line assistance after D/C. Enc mom to ask for assistance as needed when visiting baby in NICU.   Maternal Data    Feeding Feeding Type: Formula Nipple Type: Regular  LATCH Score/Interventions                      Lactation Tools Discussed/Used     Consult Status Consult Status: PRN    Geralynn OchsWILLIARD, Yeilin Zweber 01/27/2015, 1:52 PM

## 2015-01-27 NOTE — Progress Notes (Signed)
Pt discharged home with husband... Discharge instructions reviewed with pt and she verbalized understanding... Condition stable... No equipment... Pt will visit baby in NICU and will discharge home from there.

## 2015-01-27 NOTE — Progress Notes (Signed)
Clinical Social Work Department PSYCHOSOCIAL ASSESSMENT - MATERNAL/CHILD 01/27/2015  Patient:  Theresa Malone, Theresa Malone  Account Number:  [id number]  Admit Date:  01/26/2015  Ardine Eng Name:   Jarrett Soho    Clinical Social Worker:  Terri Piedra, LCSW   Date/Time:  01/26/2015 01:00 PM  Date Referred:  01/26/2015   Referral source  RN     Referred reason  Advanced Directives   Other referral source:   NICU admission-No referral    I:  FAMILY / HOME ENVIRONMENT Child's legal guardian:  PARENT  Guardian - Name Guardian - Age Guardian - Address  Claiborne [address] [place], [place] [address]  [place] Mcquade  same   Other household support members/support persons Name Relationship DOB  Aniyah DAUGHTER 10  [place] DAUGHTER Justin 4   Other support:   MOB states they have a great support system on both sides of the family, however both of their mothers have been hospitalized recently.  She states she has a few good friends who are supportive as well.    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  FOB is transitioning from working at [employer] to a new job at [employer], which is a Insurance underwriter resources:  Medicaid If [place]:  [employer] / Grade:   Maternity Care Coordinator / Child Services Coordination / Early Interventions:   [place]  Cultural issues impacting care:   None stated    III  STRENGTHS Strengths  Adequate Resources  Compliance with medical plan  Home prepared for Child (including basic supplies)  Understanding of illness  Supportive family/friends  Other - See comment   Strength comment:  Pediatric follow up will be at [facility] Current Problem:  YES    Risk Factor & Current Problem Patient Issue Family Issue Risk Factor / Current Problem Comment  Other - See comment Y N Hx of Anxiety and panic attacks   N N     V  SOCIAL WORK ASSESSMENT  CSW met with MOB and FOB in MOB's third floor room/304 to introduce myself, complete assessment due to baby's admission to NICU and offer support.  Parents were very pleasant and receptive to CSW intervention.  CSW explained role in NICU and asked how parents have been coping with baby's admission.  FOB was fairly quiet throughout the conversation, but appears very supportive and loving towards MOB.  MOB was talkative and seemed appreciative of the opportunity to share her story.  MOB states she is saddened that she and her baby are separated by Hannah's admission to the NICU, but that she is "trying to stay positive" and just wants what is best for her baby.  She shared that her first baby was born at 54 weeks and was a "NICU baby."  She reports that Hannah's admission has brought back a lot of emotions related to her first daughter's birth.  CSW encouraged her to allow herself to address these emotions and helped her process them.  CSW offered for her to share her story of her first daughter's birth and NICU stay, which MOB eagerly did.  CSW suggested that she see Hannah's birth story as a  new and different situation, but to not suppress the feelings she is experiencing related to her first daughter's birth.  MOB seemed comforted in being allowed to talk about her feelings of fear and anxiety related to her first daughter's admission, even though the situation was much different.  She asked about what has change in the last 10 years, which we briefly discussed (parent rounds, shift change, etc).  CSW also spoke to [facility] about her hx of Anxiety and panic attacks.  She states she had one recently when she saw her mother in the hospital on a ventilator.  She reports even though she has had these episodes before, she did not  know what was happening this time, as it came on strongly and quickly.  CSW normalized the situation as anxiety provoking and asked her about her coping skills and past therapy.  MOB states she had therapy for approximately a year in 2013 at [facility].  She states she mainly listens to music to de-stress and has learned that she can get through a panic attack without it hindering her ability to function.  She also states she had to get rid of some negative people in her life and realize that she cannot please everyone.  She reports never taking medication for Anxiety and not feeling like she needs medication or therapy at this time.  CSW encouraged parents not to have expectations for baby's discharge, but to enjoy each day with her, whether she be in the hospital or at home.  CSW assisted parents in seeing positive aspects of the situation.  They are able to identify strengths and appear to have a positive frame of mind about the situation.  CSW provided education on signs and symptoms of PPD and asked that MOB talk with CSW and or her doctor if symptoms arise or if she has concerns about her emotions at any time.  MOB asked CSW questions that CSW felt would be better directed at the medical team and suggested that she and FOB write down a list of things they would like to speak to the MD about and that CSW will notify the MD that they would like to speak with him.  They were very appreciative.  CSW contacted Dr. Rattray/Neonatologist who agreed to meet with parents in MOB's room for an update.  Parents thanked CSW for talking with them and seemed to genuinely appreciate CSW's concern for their emotional wellbeing and education given.  CSW provided contact information.  CSW identifies no social concerns at this time and no barriers to discharge when baby is medically ready.   VI SOCIAL WORK PLAN Social Work Plan  Psychosocial Support/Ongoing Assessment of Needs  Patient/Family Education   Type  of pt/family education:   Ongoing support services offered by NICU CSW  PPD signs and symptoms  PTSD discussion from previous NICU baby   If child protective services report - county:   If child protective services report - date:   Information/referral to community resources comment:   No referral needs noted at this time.   Other social work plan:

## 2015-01-27 NOTE — Progress Notes (Signed)
Patient stated she was experiencing numbness and tingling in right arm. Stated it began in her fingers and has risen to her shoulder. Patient appears anxious. Informed her that swelling from edema in her extremities may cause her to feel those sensations. Elevated right arm with pillows for comfort. Informed Anyanwu MD of patient concerns no further interventions at this time. Will continue to monitor

## 2015-01-27 NOTE — Discharge Summary (Addendum)
Obstetric Discharge Summary Reason for Admission: severe preeclmapsia; chronic HTN; Class B GDM Prenatal Procedures: NST Intrapartum Procedures: cesarean: low cervical, transverse and tubal ligation Postpartum Procedures: none Complications-Operative and Postpartum: none HEMOGLOBIN  Date Value Ref Range Status  01/25/2015 8.9* 12.0 - 15.0 g/dL Final   HCT  Date Value Ref Range Status  01/25/2015 27.6* 36.0 - 46.0 % Final   Pt reports that overnight she had numbness in her right arm that has resolved.  She is tolerating a reg diet. Her she is ambulating without difficulty and tolerating her pain with po meds.  She has had a smooth post op course. Her FSG has been <130's.  She has had a few  BP's in the severe range but, was not started on her HCTZ which will be restarted today.  She is stable for discharge.   Physical Exam:  General: alert and no distress Lochia: appropriate Uterine Fundus: firm Incision: PICO wound vac in place.  Dressing dry. DVT Evaluation: No evidence of DVT seen on physical exam. Bilateral LE edema: symmetric  Discharge Diagnoses: Term Pregnancy-delivered, Preelampsia and Class B GDM  Discharge Information: Date: 01/27/2015 Activity: pelvic rest Diet: Carb modified Medications: Ibuprofen, Percocet and Labetolol; HCTZ and Glyburide  Condition: stable Instructions: refer to practice specific booklet Discharge to: home Follow-up Information    Follow up with [facility] In 4 days.   Why:  for removal of Wound Vac and repeat blood pressure    Contact information:   [address] [place] [phone] [phone]      Newborn Data: Live born female  Birth Weight: 9 lb 14.6 oz (4496 g) APGAR: 2, 7  In NICU   HARRAWAY-SMITH, Catalino Plascencia 01/27/2015, 10:02 AM

## 2015-01-27 NOTE — Discharge Instructions (Signed)

## 2015-01-28 ENCOUNTER — Other Ambulatory Visit: Payer: Medicaid Other

## 2015-01-28 NOTE — Telephone Encounter (Signed)
Pt has had cesarean after presenting with severe bp's and headaches.

## 2015-01-31 ENCOUNTER — Ambulatory Visit (INDEPENDENT_AMBULATORY_CARE_PROVIDER_SITE_OTHER): Payer: Medicaid Other | Admitting: Women's Health

## 2015-01-31 ENCOUNTER — Encounter: Payer: Self-pay | Admitting: Women's Health

## 2015-01-31 VITALS — BP 180/98 | HR 76 | Ht 61.0 in | Wt 262.0 lb

## 2015-01-31 DIAGNOSIS — Z5189 Encounter for other specified aftercare: Secondary | ICD-10-CM

## 2015-01-31 DIAGNOSIS — R309 Painful micturition, unspecified: Secondary | ICD-10-CM

## 2015-01-31 DIAGNOSIS — I10 Essential (primary) hypertension: Secondary | ICD-10-CM

## 2015-01-31 DIAGNOSIS — E119 Type 2 diabetes mellitus without complications: Secondary | ICD-10-CM | POA: Diagnosis not present

## 2015-01-31 DIAGNOSIS — Z1389 Encounter for screening for other disorder: Secondary | ICD-10-CM

## 2015-01-31 DIAGNOSIS — Z9889 Other specified postprocedural states: Secondary | ICD-10-CM

## 2015-01-31 LAB — POCT URINALYSIS DIPSTICK
Glucose, UA: NEGATIVE
KETONES UA: NEGATIVE
Leukocytes, UA: NEGATIVE
NITRITE UA: NEGATIVE
Protein, UA: NEGATIVE

## 2015-01-31 MED ORDER — TRIAMTERENE-HCTZ 75-50 MG PO TABS: 1.0000 | ORAL_TABLET | Freq: Every day | ORAL | Status: DC

## 2015-01-31 NOTE — Progress Notes (Signed)
Patient ID: Theresa BoutonKelly R Hoeppner, female   DOB: 11/28/1980, 34 y.o.   MRN: [id number]   [facility]  Patient name: Theresa BoutonKelly R Meixner MRN [id number]  Date of birth: 11/28/1980  CC & HPI:  Theresa BoutonKelly R Holroyd is a 34 y.o. [phone] [id number] African American female presenting today for incision check 1wk s/p RLTCS at 36.4wks for severe pre-e and breech presentation. She is also TypeII DM on glyburide 10mg  bid, states cbg's have been great since having baby. Breast and bottlefeeding. Infant just came home from NICU yesterday d/t hypoglycemia. On HCTZ 25mg  daily and Labetalol 200mg  bid for CHTN. Some occ ha's.  Pertinent History Reviewed:  Medical & Surgical Hx:   Past Medical History  Diagnosis Date  . Hypertension   . Diabetes mellitus   . Anxiety     panic attacks  . Obesity    Past Surgical History  Procedure Laterality Date  . Cesarean section      C/S x 2  . Cesarean section N/A 01/24/2015    Procedure: CESAREAN SECTION;  Surgeon: Catalina AntiguaPeggy Constant, MD;  Location: [facility];  Service: Obstetrics;  Laterality: N/A;   Medications: Reviewed & Updated - see associated section Social History: Reviewed -  reports that she has never smoked. She has never used smokeless tobacco.  Objective Findings:  Vitals: BP 180/98 mmHg  Pulse 76  Ht 5\' 1"  (1.549 m)  Wt 262 lb (118.842 kg)  BMI 49.53 kg/m2  LMP 05/13/2014  Breastfeeding? Yes  Physical Examination: General appearance - alert, well appearing, and in no distress Abdomen: peau d' orange appearance, incision w/ PICO wound vac- removed by LHE, incision healing well, painted w/ gentian violet by LHE Extremities: 1+ BLE edema  Results for orders placed or performed in visit on 01/31/15 (from the past 24 hour(s))  POCT Urinalysis Dipstick   Collection Time: 01/31/15  2:19 PM  Result Value Ref Range   Color, UA eg    Clarity, UA     Glucose, UA neg    Bilirubin, UA     Ketones, UA neg    Spec Grav, UA     Blood, UA large    pH, UA     Protein, UA neg    Urobilinogen, UA     Nitrite, UA neg    Leukocytes, UA Negative      Assessment & Plan:  A:   1wk s/p RLTCS at 36.4wks for severe pre-e & breech presentation  CHTN not controlled on labetalol & HCTZ  TypeII DM, controlled on glyburide 10mg  daily  Breast/bottlefeeding P:  Stop labetalol & HCTZ, rx maxzide 75/50 daily per LHE   F/U 1wk for incision check/pain w/ gentian violet  Keep incision clean and dry- can place peripad to assist w/ this  Marge DuncansBooker, Shea Kapur Randall CNM, [facility]NP-BC 01/31/2015 2:53 PM

## 2015-02-04 ENCOUNTER — Telehealth: Payer: Self-pay | Admitting: *Deleted

## 2015-02-04 NOTE — Telephone Encounter (Signed)
Pt states that she is having trouble with constipation. Last BM was Monday. Pt stated that she was taking a stool softener before delivery. Pt advised i would ask a provider to make sure what she could take.   I spoke with Dr. Despina HiddenEure and he advise the pt use a dulcolax suppository and then take stool softeners. I advised the pt of this. Pt verbalized understanding.

## 2015-02-05 ENCOUNTER — Encounter (HOSPITAL_COMMUNITY): Payer: Self-pay

## 2015-02-05 ENCOUNTER — Inpatient Hospital Stay (HOSPITAL_COMMUNITY)
Admission: AD | Admit: 2015-02-05 | Discharge: 2015-02-05 | Disposition: A | Discharge status: Home / Self Care | Payer: Medicaid Other | Source: Ambulatory Visit | Attending: Obstetrics & Gynecology | Admitting: Obstetrics & Gynecology

## 2015-02-05 DIAGNOSIS — R030 Elevated blood-pressure reading, without diagnosis of hypertension: Secondary | ICD-10-CM | POA: Diagnosis present

## 2015-02-05 DIAGNOSIS — O2413 Pre-existing diabetes mellitus, type 2, in the puerperium: Secondary | ICD-10-CM | POA: Diagnosis not present

## 2015-02-05 DIAGNOSIS — O1003 Pre-existing essential hypertension complicating the puerperium: Secondary | ICD-10-CM | POA: Insufficient documentation

## 2015-02-05 DIAGNOSIS — O165 Unspecified maternal hypertension, complicating the puerperium: Secondary | ICD-10-CM

## 2015-02-05 DIAGNOSIS — E119 Type 2 diabetes mellitus without complications: Secondary | ICD-10-CM | POA: Insufficient documentation

## 2015-02-05 LAB — CBC
HCT: 31.9 % — ABNORMAL LOW (ref 36.0–46.0)
Hemoglobin: 10 g/dL — ABNORMAL LOW (ref 12.0–15.0)
MCH: 25.6 pg — ABNORMAL LOW (ref 26.0–34.0)
MCHC: 31.3 g/dL (ref 30.0–36.0)
MCV: 81.8 fL (ref 78.0–100.0)
PLATELETS: 373 10*3/uL (ref 150–400)
RBC: 3.9 MIL/uL (ref 3.87–5.11)
RDW: 16.4 % — AB (ref 11.5–15.5)
WBC: 8.6 10*3/uL (ref 4.0–10.5)

## 2015-02-05 LAB — COMPREHENSIVE METABOLIC PANEL
ALBUMIN: 3.5 g/dL (ref 3.5–5.2)
ALK PHOS: 85 U/L (ref 39–117)
ALT: 25 U/L (ref 0–35)
AST: 17 U/L (ref 0–37)
Anion gap: 10 (ref 5–15)
BUN: 15 mg/dL (ref 6–23)
CALCIUM: 9.6 mg/dL (ref 8.4–10.5)
CHLORIDE: 102 mmol/L (ref 96–112)
CO2: 27 mmol/L (ref 19–32)
Creatinine, Ser: 0.83 mg/dL (ref 0.50–1.10)
GLUCOSE: 72 mg/dL (ref 70–99)
Potassium: 4.2 mmol/L (ref 3.5–5.1)
SODIUM: 139 mmol/L (ref 135–145)
Total Bilirubin: 0.5 mg/dL (ref 0.3–1.2)
Total Protein: 7.7 g/dL (ref 6.0–8.3)

## 2015-02-05 LAB — PROTEIN / CREATININE RATIO, URINE
Creatinine, Urine: 51 mg/dL
Protein Creatinine Ratio: 0.29 — ABNORMAL HIGH (ref 0.00–0.15)
Total Protein, Urine: 15 mg/dL

## 2015-02-05 LAB — URINALYSIS, ROUTINE W REFLEX MICROSCOPIC
Bilirubin Urine: NEGATIVE
Glucose, UA: NEGATIVE mg/dL
Ketones, ur: NEGATIVE mg/dL
NITRITE: NEGATIVE
PH: 6.5 (ref 5.0–8.0)
Protein, ur: NEGATIVE mg/dL
Specific Gravity, Urine: <1.005 — ABNORMAL LOW (ref 1.005–1.030)
Urobilinogen, UA: 0.2 mg/dL (ref 0.0–1.0)

## 2015-02-05 LAB — URINE MICROSCOPIC-ADD ON

## 2015-02-05 MED ORDER — LABETALOL HCL 200 MG PO TABS: 200.0000 mg | ORAL_TABLET | Freq: Two times a day (BID) | ORAL | Status: DC

## 2015-02-05 MED ORDER — LABETALOL HCL 100 MG PO TABS
200.0000 mg | ORAL_TABLET | Freq: Once | ORAL | Status: AC
  Administered 2015-02-05: 200 mg via ORAL
  Filled 2015-02-05: qty 2

## 2015-02-05 NOTE — Discharge Instructions (Signed)
Hypertension During Pregnancy °Hypertension is also called high blood pressure. Blood pressure moves blood in your body. Sometimes, the force that moves the blood becomes too strong. When you are pregnant, this condition should be watched carefully. It can cause problems for you and your baby. °HOME CARE  °· Make and keep all of your doctor visits. °· Take medicine as told by your doctor. Tell your doctor about all medicines you take. °· Eat very little salt. °· Exercise regularly. °· Do not drink alcohol. °· Do not smoke. °· Do not have drinks with caffeine. °· Lie on your left side when resting. °· Your health care provider may ask you to take one low-dose aspirin (81mg) each day. °GET HELP RIGHT AWAY IF: °· You have bad belly (abdominal) pain. °· You have sudden puffiness (swelling) in the hands, ankles, or face. °· You gain 4 pounds (1.8 kilograms) or more in 1 week. °· You throw up (vomit) repeatedly. °· You have bleeding from the vagina. °· You do not feel the baby moving as much. °· You have a headache. °· You have blurred or double vision. °· You have muscle twitching or spasms. °· You have shortness of breath. °· You have blue fingernails and lips. °· You have blood in your pee (urine). °MAKE SURE YOU: °· Understand these instructions. °· Will watch your condition. °· Will get help right away if you are not doing well or get worse. °Document Released: 12/15/2010 Document Revised: 03/29/2014 Document Reviewed: 06/11/2013 °ExitCare® Patient Information ©2015 [employer]. This information is not intended to replace advice given to you by your health care provider. Make sure you discuss any questions you have with your health care provider. ° °

## 2015-02-05 NOTE — MAU Provider Note (Signed)
History     CSN: [id number]  Arrival date and time: 02/05/15 2056   None     Chief Complaint  Patient presents with  . Hypertension  . Dizziness   HPI 34 y.o. [id number] at 12 days s/p RLTCS w/ c/o elevated BP, dizziness, "seeing stars" starting tonight. Pt has CHTN, had severe preeclampsia during pregnancy, as well as type 2 DM. Pt states she had a home visit with a nurse yesterday who advised her to come to hospital d/t elevated BP, but she did not come in at that time. Tonight started feeling dizzy, took BP at home and reports it was 160/122. Taking Maxide daily for HTN, states she had her normal dose at 10 AM this morning. + mild headache, denies abd pain, n/v. Pt is breastfeeding.   Past Medical History  Diagnosis Date  . Hypertension   . Diabetes mellitus   . Anxiety     panic attacks  . Obesity     Past Surgical History  Procedure Laterality Date  . Cesarean section      C/S x 2  . Cesarean section N/A 01/24/2015    Procedure: CESAREAN SECTION;  Surgeon: Catalina AntiguaPeggy Constant, MD;  Location: [facility];  Service: Obstetrics;  Laterality: N/A;    Family History  Problem Relation Age of Onset  . Diabetes Paternal Grandfather   . Cancer Paternal Grandmother     liver  . Hypertension Father   . Diabetes Mother   . Hypertension Mother   . Diabetes Maternal Uncle   . Diabetes Maternal Grandmother   . Cancer Maternal Grandfather   . Diabetes Daughter     boarderline   . Asthma Daughter   . Bronchitis Daughter   . Bronchitis Son   . Bronchitis Daughter     History  Substance Use Topics  . Smoking status: Never Smoker   . Smokeless tobacco: Never Used  . Alcohol Use: No     Comment: occ    Allergies:  Allergies  Allergen Reactions  . Lisinopril Cough    cough    Prescriptions prior to admission  Medication Sig Dispense Refill Last Dose  . glyBURIDE (DIABETA) 5 MG tablet Take 2 tablets (10 mg total) by mouth 2 (two) times daily with a meal. 120 tablet 6 02/05/2015  at Unknown time  . ibuprofen (ADVIL,MOTRIN) 600 MG tablet Take 1 tablet (600 mg total) by mouth every 6 (six) hours. 30 tablet 0 02/05/2015 at Unknown time  . oxyCODONE-acetaminophen (PERCOCET/ROXICET) 5-325 MG per tablet Take 1 tablet by mouth every 4 (four) hours as needed (for pain scale less than 7). 30 tablet 0 Past Week at Unknown time  . triamterene-hydrochlorothiazide (MAXZIDE) 75-50 MG per tablet Take 1 tablet by mouth daily. 30 tablet 3 02/05/2015 at Unknown time  . zolpidem (AMBIEN) 5 MG tablet Take 1 tablet (5 mg total) by mouth at bedtime as needed for sleep. 10 tablet 0 Past Week at Unknown time  . glucose blood test strip 1 each by Other route as needed. Use as instructed  4 times daily will use x 6 wks more   Taking  . glyBURIDE (DIABETA) 5 MG tablet Take 1 tablet (5 mg total) by mouth 2 (two) times daily with a meal. (Patient not taking: Reported on 01/31/2015) 30 tablet 0 Not Taking    Review of Systems  Constitutional: Negative.   Eyes:       + scotoma  Respiratory: Negative.   Cardiovascular: Negative.   Gastrointestinal: Negative  for nausea, vomiting, abdominal pain, diarrhea and constipation.  Genitourinary: Negative for dysuria, urgency, frequency, hematuria and flank pain.  Musculoskeletal: Negative.   Neurological: Positive for dizziness and headaches.  Psychiatric/Behavioral: Negative.    Physical Exam   Blood pressure 136/89, pulse 79, temperature 98.1 F (36.7 C), resp. rate 16, height  (1.549 m), weight 245 lb 12.8 oz (111.494 kg), SpO2 100 %, currently breastfeeding.  Physical Exam  Nursing note and vitals reviewed. Constitutional: She is oriented to person, place, and time. She appears well-developed. No distress.  Obese   HENT:  Head: Normocephalic and atraumatic.  Neck: Normal range of motion.  Cardiovascular: Normal rate and regular rhythm.   Respiratory: Effort normal and breath sounds normal.  GI: Soft. There is no tenderness.   Musculoskeletal: Normal range of motion.  Neurological: She is alert and oriented to person, place, and time. She has normal reflexes. No cranial nerve deficit.  Neg clonus   Skin: Skin is warm and dry.  Psychiatric: She has a normal mood and affect.    MAU Course  Procedures Results for orders placed or performed during the hospital encounter of 02/05/15 (from the past 24 hour(s))  Urinalysis, Routine w reflex microscopic     Status: Abnormal   Collection Time: 02/05/15  9:25 PM  Result Value Ref Range   Color, Urine YELLOW YELLOW   APPearance CLEAR CLEAR   Specific Gravity, Urine <1.005 (L) 1.005 - 1.030   pH 6.5 5.0 - 8.0   Glucose, UA NEGATIVE NEGATIVE mg/dL   Hgb urine dipstick LARGE (A) NEGATIVE   Bilirubin Urine NEGATIVE NEGATIVE   Ketones, ur NEGATIVE NEGATIVE mg/dL   Protein, ur NEGATIVE NEGATIVE mg/dL   Urobilinogen, UA 0.2 0.0 - 1.0 mg/dL   Nitrite NEGATIVE NEGATIVE   Leukocytes, UA LARGE (A) NEGATIVE  Protein / creatinine ratio, urine     Status: Abnormal   Collection Time: 02/05/15  9:25 PM  Result Value Ref Range   Creatinine, Urine 51.00 mg/dL   Total Protein, Urine 15 mg/dL   Protein Creatinine Ratio 0.29 (H) 0.00 - 0.15  Urine microscopic-add on     Status: None   Collection Time: 02/05/15  9:25 PM  Result Value Ref Range   Squamous Epithelial / LPF RARE RARE   WBC, UA 7-10 <3 WBC/hpf   RBC / HPF 11-20 <3 RBC/hpf   Bacteria, UA RARE RARE  CBC     Status: Abnormal   Collection Time: 02/05/15  9:42 PM  Result Value Ref Range   WBC 8.6 4.0 - 10.5 K/uL   RBC 3.90 3.87 - 5.11 MIL/uL   Hemoglobin 10.0 (L) 12.0 - 15.0 g/dL   HCT 16.1 (L) 09.6 - 04.5 %   MCV 81.8 78.0 - 100.0 fL   MCH 25.6 (L) 26.0 - 34.0 pg   MCHC 31.3 30.0 - 36.0 g/dL   RDW 40.9 (H) 81.1 - 91.4 %   Platelets 373 150 - 400 K/uL  Comprehensive metabolic panel     Status: None   Collection Time: 02/05/15  9:42 PM  Result Value Ref Range   Sodium 139 135 - 145 mmol/L   Potassium 4.2 3.5  - 5.1 mmol/L   Chloride 102 96 - 112 mmol/L   CO2 27 19 - 32 mmol/L   Glucose, Bld 72 70 - 99 mg/dL   BUN 15 6 - 23 mg/dL   Creatinine, Ser 7.82 0.50 - 1.10 mg/dL   Calcium 9.6 8.4 - 95.6 mg/dL  Total Protein 7.7 6.0 - 8.3 g/dL   Albumin 3.5 3.5 - 5.2 g/dL   AST 17 0 - 37 U/L   ALT 25 0 - 35 U/L   Alkaline Phosphatase 85 39 - 117 U/L   Total Bilirubin 0.5 0.3 - 1.2 mg/dL   GFR calc non Af Amer >90 >90 mL/min   GFR calc Af Amer >90 >90 mL/min   Anion gap 10 5 - 15   Filed Vitals:   02/05/15 2215 02/05/15 2235 02/05/15 2258 02/05/15 2318  BP: 161/96 151/83 151/91 136/89  Pulse: 88 83 79 79  Temp:      Resp:   16   Height:      Weight:      SpO2:       Labetalol 200 mg PO at 2204 2258 - pt resting comfortably, states "stars"/blurred vision has resolved, headache has resolved  Rev'd w/ Dr. Macon Large, ok to d/c to home w/ labetalol, f/u in office on Monday  Assessment and Plan   1. Hypertension, postpartum condition or complication   Add labetalol 200 mg bid, precautions rev'd, f/u in MAU w/ elevated pressures or increased symptoms, otherwise f/u in office on Monday    Medication List    TAKE these medications        glucose blood test strip  1 each by Other route as needed. Use as instructed  4 times daily will use x 6 wks more     glyBURIDE 5 MG tablet  Commonly known as:  DIABETA  Take 2 tablets (10 mg total) by mouth 2 (two) times daily with a meal.     ibuprofen 600 MG tablet  Commonly known as:  ADVIL,MOTRIN  Take 1 tablet (600 mg total) by mouth every 6 (six) hours.     labetalol 200 MG tablet  Commonly known as:  NORMODYNE  Take 1 tablet (200 mg total) by mouth 2 (two) times daily.     oxyCODONE-acetaminophen 5-325 MG per tablet  Commonly known as:  PERCOCET/ROXICET  Take 1 tablet by mouth every 4 (four) hours as needed (for pain scale less than 7).     triamterene-hydrochlorothiazide 75-50 MG per tablet  Commonly known as:  MAXZIDE  Take 1 tablet by  mouth daily.     zolpidem 5 MG tablet  Commonly known as:  AMBIEN  Take 1 tablet (5 mg total) by mouth at bedtime as needed for sleep.            Follow-up Information    Follow up with [facility]. Schedule an appointment as soon as possible for a visit on 02/07/2015.   Specialty:  Obstetrics and Gynecology   Contact information:   [address] [place] [address] [phone]        [facility] 02/05/2015, 11:31 PM

## 2015-02-05 NOTE — MAU Note (Addendum)
Nurse cked bp yesterday and was 194/96. Couldn't come yesterday. B/P at home was 166/122. Feeling dizzy and lightheaded. Had C/S 2/29 for preeclampsia. Blurry vision and slight headache. Takes Triamterene75/HCTZ 50mg  daily and took it this am at 1000. Diabetic

## 2015-02-07 ENCOUNTER — Encounter: Payer: Medicaid Other | Admitting: Obstetrics & Gynecology

## 2015-02-09 ENCOUNTER — Ambulatory Visit: Payer: Medicaid Other | Admitting: Adult Health

## 2015-02-10 ENCOUNTER — Encounter (HOSPITAL_COMMUNITY)
Admission: AD | Disposition: A | Discharge status: Home / Self Care | Payer: Self-pay | Source: Ambulatory Visit | Attending: Obstetrics and Gynecology

## 2015-02-10 ENCOUNTER — Encounter: Payer: Self-pay | Admitting: Obstetrics & Gynecology

## 2015-02-10 ENCOUNTER — Ambulatory Visit (INDEPENDENT_AMBULATORY_CARE_PROVIDER_SITE_OTHER): Payer: Medicaid Other | Admitting: Obstetrics & Gynecology

## 2015-02-10 VITALS — BP 140/90 | Wt 239.4 lb

## 2015-02-10 DIAGNOSIS — I158 Other secondary hypertension: Secondary | ICD-10-CM | POA: Diagnosis not present

## 2015-02-10 SURGERY — Surgical Case
Anesthesia: Regional

## 2015-02-10 MED ORDER — TRIAMTERENE-HCTZ 75-50 MG PO TABS: 1.0000 | ORAL_TABLET | Freq: Every day | ORAL | Status: DC

## 2015-02-10 MED ORDER — PRENATAL VITAMINS 0.8 MG PO TABS: 1.0000 | ORAL_TABLET | Freq: Every day | ORAL | Status: DC

## 2015-02-10 NOTE — Progress Notes (Signed)
Patient ID: Theresa Malone, female   DOB: 15-Mar-1981, 34 y.o.   MRN: [id number] Chief Complaint  Patient presents with  . Blood Pressure Check    Seen ER on 3 /12/16 b/p up.    Blood pressure 140/90, weight 239 lb 6.4 oz (108.591 kg), currently breastfeeding.  Pt had increased BP went to ER and they restarted her labetalol and stopped her maxzide  Restart the maxzide and continue labetalol  Will still try to stop the labetalol in the future     Face to face time:  10 minutes  Greater than 50% of the visit time was spent in counseling and coordination of care with the patient.  The summary and outline of the counseling and care coordination is summarized in the note above.   All questions were answered.

## 2015-02-24 ENCOUNTER — Ambulatory Visit: Payer: Medicaid Other | Admitting: Obstetrics & Gynecology

## 2015-03-04 ENCOUNTER — Ambulatory Visit (INDEPENDENT_AMBULATORY_CARE_PROVIDER_SITE_OTHER): Payer: Medicaid Other | Admitting: Obstetrics & Gynecology

## 2015-03-04 ENCOUNTER — Encounter: Payer: Self-pay | Admitting: Obstetrics & Gynecology

## 2015-03-04 VITALS — BP 116/80 | HR 76 | Wt 234.0 lb

## 2015-03-04 DIAGNOSIS — I10 Essential (primary) hypertension: Secondary | ICD-10-CM | POA: Diagnosis not present

## 2015-03-04 NOTE — Progress Notes (Signed)
Patient ID: Theresa BoutonKelly R Malone, female   DOB: [birth year], 34 y.o.   MRN: [id number] Patient ID: Theresa BoutonKelly R Malone, female   DOB: [birth year], 34 y.o.   MRN: [id number] Chief Complaint  Patient presents with  . Follow-up    check incision/ b/p.    Blood pressure 116/80, pulse 76, weight 234 lb (106.142 kg), currently breastfeeding.  Pt had increased BP went to ER and they restarted her labetalol and stopped her maxzide  Restarted  the maxzide and continued labetalol  Will still try to stop the labetalol in the future: stop today  Gentian violet placed on incision today due to moisture changes     Face to face time:  10 minutes  Greater than 50% of the visit time was spent in counseling and coordination of care with the patient.  The summary and outline of the counseling and care coordination is summarized in the note above.   All questions were answered.

## 2015-03-14 ENCOUNTER — Encounter: Payer: Self-pay | Admitting: *Deleted

## 2015-03-16 ENCOUNTER — Telehealth: Payer: Self-pay | Admitting: *Deleted

## 2015-03-16 ENCOUNTER — Other Ambulatory Visit: Payer: Self-pay | Admitting: Obstetrics & Gynecology

## 2015-03-16 MED ORDER — GLYBURIDE 5 MG PO TABS: 5.0000 mg | ORAL_TABLET | Freq: Two times a day (BID) | ORAL | Status: DC

## 2015-03-16 NOTE — Telephone Encounter (Signed)
Pt requesting a refill on Glyburide 5 mg po 2 times daily.

## 2015-03-18 ENCOUNTER — Ambulatory Visit (INDEPENDENT_AMBULATORY_CARE_PROVIDER_SITE_OTHER): Payer: Medicaid Other | Admitting: Obstetrics & Gynecology

## 2015-03-18 ENCOUNTER — Encounter: Payer: Self-pay | Admitting: Obstetrics & Gynecology

## 2015-03-18 DIAGNOSIS — R319 Hematuria, unspecified: Secondary | ICD-10-CM

## 2015-03-18 MED ORDER — SULFAMETHOXAZOLE-TRIMETHOPRIM 800-160 MG PO TABS: 1.0000 | ORAL_TABLET | Freq: Two times a day (BID) | ORAL | Status: DC

## 2015-03-18 NOTE — Addendum Note (Signed)
Addended by: Criss AlvinePULLIAM, Daquawn Seelman G on: 03/18/2015 11:25 AM   Modules accepted: Orders

## 2015-03-18 NOTE — Progress Notes (Signed)
Patient ID: Theresa BoutonKelly R Malone, female   DOB: 1981-08-13, 34 y.o.   MRN: [id number] Subjective:     Theresa BoutonKelly R Malone is a 34 y.o. female who presents for a postpartum visit. She is 6 weeks postpartum following a low cervical transverse Cesarean section. I have fully reviewed the prenatal and intrapartum course. The delivery was at 36 gestational weeks. Outcome: repeat cesarean section, low transverse incision. Anesthesia: spinal. Postpartum course has been unremarkable. Baby's course has been normal. Baby is feeding by bottle - Similac Advance. Bleeding no bleeding. Bowel function is normal. Bladder function is abnormal: UTI. Patient is sexually active. Contraception method is tubal ligation. Postpartum depression screening: negative.  The following portions of the patient's history were reviewed and updated as appropriate: allergies, current medications, past family history, past medical history, past social history, past surgical history and problem list.  Review of Systems Pertinent items are noted in HPI.   Objective:    BP 120/90 mmHg  Pulse 72  Wt 237 lb (107.502 kg)  LMP 03/12/2015  General:  alert, cooperative and no distress   Breasts:    Lungs:   Heart:    Abdomen: soft, non-tender; bowel sounds normal; no masses,  no organomegaly   Vulva:  normal  Vagina: normal vagina  Cervix:  normal  Corpus: normal size, contour, position, consistency, mobility, non-tender  Adnexa:  normal adnexa  Rectal Exam:         Assessment:     Normal postpartum exam. Pap smear not done at today's visit.   Plan:    1. Contraception: tubal ligation 2. UTI: bactrim 3. Follow up in: 6 months or as needed.

## 2015-03-20 LAB — URINE CULTURE

## 2015-06-04 ENCOUNTER — Encounter (HOSPITAL_COMMUNITY): Payer: Self-pay | Admitting: Emergency Medicine

## 2015-06-04 ENCOUNTER — Emergency Department (HOSPITAL_COMMUNITY)
Admission: EM | Admit: 2015-06-04 | Discharge: 2015-06-04 | Disposition: A | Discharge status: Home / Self Care | Payer: Medicaid Other | Attending: Emergency Medicine | Admitting: Emergency Medicine

## 2015-06-04 DIAGNOSIS — I10 Essential (primary) hypertension: Secondary | ICD-10-CM | POA: Insufficient documentation

## 2015-06-04 DIAGNOSIS — Z79899 Other long term (current) drug therapy: Secondary | ICD-10-CM | POA: Diagnosis not present

## 2015-06-04 DIAGNOSIS — E669 Obesity, unspecified: Secondary | ICD-10-CM | POA: Insufficient documentation

## 2015-06-04 DIAGNOSIS — L732 Hidradenitis suppurativa: Secondary | ICD-10-CM | POA: Insufficient documentation

## 2015-06-04 DIAGNOSIS — L02411 Cutaneous abscess of right axilla: Secondary | ICD-10-CM

## 2015-06-04 DIAGNOSIS — E119 Type 2 diabetes mellitus without complications: Secondary | ICD-10-CM | POA: Insufficient documentation

## 2015-06-04 DIAGNOSIS — Z8659 Personal history of other mental and behavioral disorders: Secondary | ICD-10-CM | POA: Insufficient documentation

## 2015-06-04 MED ORDER — PROPOFOL 10 MG/ML IV BOLUS
INTRAVENOUS | Status: AC | PRN
  Administered 2015-06-04: 60 mg via INTRAVENOUS

## 2015-06-04 MED ORDER — KETAMINE HCL 10 MG/ML IJ SOLN: 1.0000 mg/kg | Freq: Once | INTRAMUSCULAR | Status: DC

## 2015-06-04 MED ORDER — SODIUM CHLORIDE 0.9 % IV BOLUS (SEPSIS)
1000.0000 mL | Freq: Once | INTRAVENOUS | Status: AC
  Administered 2015-06-04: 1000 mL via INTRAVENOUS

## 2015-06-04 MED ORDER — PROPOFOL 10 MG/ML IV BOLUS
0.5000 mg/kg | Freq: Once | INTRAVENOUS | Status: DC
  Filled 2015-06-04: qty 20

## 2015-06-04 MED ORDER — OXYCODONE-ACETAMINOPHEN 5-325 MG PO TABS: 1.0000 | ORAL_TABLET | Freq: Four times a day (QID) | ORAL | Status: DC | PRN

## 2015-06-04 MED ORDER — HYDROMORPHONE HCL 1 MG/ML IJ SOLN
1.0000 mg | Freq: Once | INTRAMUSCULAR | Status: AC
  Administered 2015-06-04: 1 mg via INTRAMUSCULAR
  Filled 2015-06-04: qty 1

## 2015-06-04 MED ORDER — LIDOCAINE-EPINEPHRINE 1 %-1:100000 IJ SOLN
20.0000 mL | Freq: Once | INTRAMUSCULAR | Status: AC
  Administered 2015-06-04: 20 mL
  Filled 2015-06-04: qty 1

## 2015-06-04 MED ORDER — HYDROMORPHONE HCL 1 MG/ML IJ SOLN: 1.0000 mg | Freq: Once | INTRAMUSCULAR | Status: DC

## 2015-06-04 MED ORDER — KETAMINE HCL 10 MG/ML IJ SOLN
INTRAMUSCULAR | Status: AC | PRN
  Administered 2015-06-04: 60 mg via INTRAVENOUS

## 2015-06-04 MED ORDER — HYDROMORPHONE HCL 1 MG/ML IJ SOLN
1.0000 mg | Freq: Once | INTRAMUSCULAR | Status: AC
  Administered 2015-06-04: 1 mg via INTRAVENOUS
  Filled 2015-06-04: qty 1

## 2015-06-04 MED ORDER — DOXYCYCLINE HYCLATE 100 MG PO CAPS: 100.0000 mg | ORAL_CAPSULE | Freq: Two times a day (BID) | ORAL | Status: DC

## 2015-06-04 NOTE — Consult Note (Signed)
Theresa Malone 24-Feb-1981  [id number].    Requesting MD: Dr. Pricilla Loveless Chief Complaint/Reason for Consult: right axillary abscess HPI: This is a 34 yo black female with a h/o HTN, DM, and hidradenitis.  She states that on 7-4 she started developing some right axillary pain and swelling.  It has progressively worsened.  She put "Boil-ease" on this and it ruptured last night, but she was having a lot of pain.  She denies any fevers that she knows of but did feel warm on Thursday.  Upon arrival to the [facility], they attempted a bedside I&D but felt there may be more infection deeper and have asked [place] to evaluate the patient.  Her mother states she has had hidradenitis for many years and the husband says she has been given an abx before that dried up her drainage.  She states she generally has drainage from her axillary as well as her groins and under her breasts.  ROS : Please see HPI, otherwise negative  Family History  Problem Relation Age of Onset  . Diabetes Paternal Grandfather   . Cancer Paternal Grandmother     liver  . Hypertension Father   . Diabetes Mother   . Hypertension Mother   . Diabetes Maternal Uncle   . Diabetes Maternal Grandmother   . Cancer Maternal Grandfather   . Diabetes Daughter     boarderline   . Asthma Daughter   . Bronchitis Daughter   . Bronchitis Son   . Bronchitis Daughter     Past Medical History  Diagnosis Date  . Hypertension   . Diabetes mellitus   . Anxiety     panic attacks  . Obesity     Past Surgical History  Procedure Laterality Date  . Cesarean section      C/S x 2  . Cesarean section N/A 01/24/2015    Procedure: CESAREAN SECTION;  Surgeon: Catalina Antigua, MD;  Location: [facility];  Service: Obstetrics;  Laterality: N/A;    Social History:  reports that she has never smoked. She has never used smokeless tobacco. She reports that she does not drink alcohol or use illicit drugs.  Allergies:  Allergies  Allergen Reactions  .  Lisinopril Cough    cough     (Not in a hospital admission)  Blood pressure 128/77, pulse 89, temperature 98.9 F (37.2 C), temperature source Oral, resp. rate 24, height  (1.549 m), weight 114.306 kg (252 lb), SpO2 100 %, currently breastfeeding. Physical Exam: General: pleasant, morbidly obese black female who is laying in bed in NAD HEENT: head is normocephalic, atraumatic.  Sclera are noninjected.  PERRL.  Ears and nose without any masses or lesions.  Mouth is pink and moist Heart: regular, rate, and rhythm.  Normal s1,s2. No obvious murmurs, gallops, or rubs noted.  Palpable radial and pedal pulses bilaterally Lungs: CTAB, no wheezes, rhonchi, or rales noted.  Respiratory effort nonlabored Abd: soft, NT, ND, +BS, no masses, hernias, or organomegaly Skin: warm and dry.  She has chronic appearing hidradenitis under her bilateral axilla.  Her right has an incision from the EDPA.  No drainage currently.  Minimal erythema tracking posteriorly.  No other areas of fluctuance are noted.  Chronic scaring and induration noted.  Hidradenitis noted under her breast and groins as well. Psych: A&Ox3 with an appropriate affect.    No results found for this or any previous visit (from the past 48 hour(s)). No results found.     Assessment/Plan 1. Hidradenitis,  acute on chronic infection of right axilla  - no further surgical intervention needed currently.  Recommend a 2-3 week course of doxycycline and she can follow up prn with Dr. Magnus Malone in our office if she would like to consider surgical excision in the future.  Otherwise she should follow up with her PCP for chronic management.  Malone,Theresa E 06/04/2015, 11:30 AM Pager: [phone]  Agree with above.  Ovidio Kinavid Theresa Bucks, MD, [facility] Pager: [phone] Office phone:  [phone]

## 2015-06-04 NOTE — Discharge Instructions (Signed)
Take percocet for severe pain only. No driving or operating heavy machinery while taking percocet. This medication may cause drowsiness. Take doxycycline twice daily for 3 weeks. Follow-up with Dr. Magnus IvanBlackman with surgery.  Hidradenitis Suppurativa, Sweat Gland Abscess Hidradenitis suppurativa is a long lasting (chronic), uncommon disease of the sweat glands. With this, boil-like lumps and scarring develop in the groin, some times under the arms (axillae), and under the breasts. It may also uncommonly occur behind the ears, in the crease of the buttocks, and around the genitals.  CAUSES  The cause is from a blocking of the sweat glands. They then become infected. It may cause drainage and odor. It is not contagious. So it cannot be given to someone else. It most often shows up in puberty (about 3210 to 32 years of age). But it may happen much later. It is similar to acne which is a disease of the sweat glands. This condition is slightly more common in African-Americans and women. SYMPTOMS   Hidradenitis usually starts as one or more red, tender, swellings in the groin or under the arms (axilla).  Over a period of hours to days the lesions get larger. They often open to the skin surface, draining clear to yellow-colored fluid.  The infected area heals with scarring. DIAGNOSIS  Your caregiver makes this diagnosis by looking at you. Sometimes cultures (growing germs on plates in the lab) may be taken. This is to see what germ (bacterium) is causing the infection.  TREATMENT   Topical germ killing medicine applied to the skin (antibiotics) are the treatment of choice. Antibiotics taken by mouth (systemic) are sometimes needed when the condition is getting worse or is severe.  Avoid tight-fitting clothing which traps moisture in.  Dirt does not cause hidradenitis and it is not caused by poor hygiene.  Involved areas should be cleaned daily using an antibacterial soap. Some patients find that the  liquid form of Lever 2000, applied to the involved areas as a lotion after bathing, can help reduce the odor related to this condition.  Sometimes surgery is needed to drain infected areas or remove scarred tissue. Removal of large amounts of tissue is used only in severe cases.  Birth control pills may be helpful.  Oral retinoids (vitamin A derivatives) for 6 to 12 months which are effective for acne may also help this condition.  Weight loss will improve but not cure hidradenitis. It is made worse by being overweight. But the condition is not caused by being overweight.  This condition is more common in people who have had acne.  It may become worse under stress. There is no medical cure for hidradenitis. It can be controlled, but not cured. The condition usually continues for years with periods of getting worse and getting better (remission). Document Released: 06/26/2004 Document Revised: 02/04/2012 Document Reviewed: 02/12/2014 [facility] Patient Information 2015 [employer] [place]. This information is not intended to replace advice given to you by your health care provider. Make sure you discuss any questions you have with your health care provider.

## 2015-06-04 NOTE — ED Provider Notes (Signed)
CSN: [id number]     Arrival date & time 06/04/15  0631 History   First MD Initiated Contact with Patient 06/04/15 [phone]     Chief Complaint  Patient presents with  . Abscess    Right armpit     (Consider location/radiation/quality/duration/timing/severity/associated sxs/prior Treatment) HPI Comments: 34 y/o F presenting with an abscess under her R axilla that has been present for about 5 days, worsening last night when it "busted". She applied "boil cream" yesterday which brought it to a head. States the area is extremely painful, worse with any pressure, 10/10. No alleviating factors. Admits to subjective fevers. Reports a history of abscesses in the past. Denies history of MRSA.  Patient is a 34 y.o. female presenting with abscess. The history is provided by the patient.  Abscess Associated symptoms: fever     Past Medical History  Diagnosis Date  . Hypertension   . Diabetes mellitus   . Anxiety     panic attacks  . Obesity    Past Surgical History  Procedure Laterality Date  . Cesarean section      C/S x 2  . Cesarean section N/A 01/24/2015    Procedure: CESAREAN SECTION;  Surgeon: Catalina Antigua, MD;  Location: [facility];  Service: Obstetrics;  Laterality: N/A;   Family History  Problem Relation Age of Onset  . Diabetes Paternal Grandfather   . Cancer Paternal Grandmother     liver  . Hypertension Father   . Diabetes Mother   . Hypertension Mother   . Diabetes Maternal Uncle   . Diabetes Maternal Grandmother   . Cancer Maternal Grandfather   . Diabetes Daughter     boarderline   . Asthma Daughter   . Bronchitis Daughter   . Bronchitis Son   . Bronchitis Daughter    History  Substance Use Topics  . Smoking status: Never Smoker   . Smokeless tobacco: Never Used  . Alcohol Use: No     Comment: occ   OB History    Gravida Para Term Preterm AB TAB SAB Ectopic Multiple Living   0 4     Review of Systems  Constitutional: Positive for fever.  Skin:        + abscess.  All other systems reviewed and are negative.     Allergies  Lisinopril  Home Medications   Prior to Admission medications   Medication Sig Start Date End Date Taking? Authorizing Provider  glyBURIDE (DIABETA) 5 MG tablet Take 1 tablet (5 mg total) by mouth 2 (two) times daily with a meal. 03/16/15  Yes Lazaro Arms, MD  Multiple Vitamin (MULTIVITAMIN WITH MINERALS) TABS tablet Take 1 tablet by mouth once a week.   Yes Historical Provider, MD  triamterene-hydrochlorothiazide (MAXZIDE) 75-50 MG per tablet Take 1 tablet by mouth daily. 02/10/15  Yes Lazaro Arms, MD  doxycycline (VIBRAMYCIN) 100 MG capsule Take 1 capsule (100 mg total) by mouth 2 (two) times daily. One po bid x 21 days 06/04/15   Kathrynn Speed, PA-C  oxyCODONE-acetaminophen (PERCOCET) 5-325 MG per tablet Take 1-2 tablets by mouth every 6 (six) hours as needed for severe pain. 06/04/15   Maurina Fawaz M Ragina Fenter, PA-C   BP 128/77 mmHg  Pulse 89  Temp(Src) 98.9 F (37.2 C) (Oral)  Resp 24  Ht  (1.549 m)  Wt 252 lb (114.306 kg)  BMI 47.64 kg/m2  SpO2 100% Physical Exam  Constitutional: She is oriented  to person, place, and time. She appears well-developed and well-nourished. No distress.  HENT:  Head: Normocephalic and atraumatic.  Mouth/Throat: Oropharynx is clear and moist.  Eyes: Conjunctivae and EOM are normal.  Neck: Normal range of motion. Neck supple.  Cardiovascular: Normal rate, regular rhythm and normal heart sounds.   Pulmonary/Chest: Effort normal and breath sounds normal. No respiratory distress.  Musculoskeletal: Normal range of motion. She exhibits no edema.  Neurological: She is alert and oriented to person, place, and time. No sensory deficit.  Skin: Skin is warm and dry.  6 cm area of induration under R axilla with purulent drainage from an area of tracking. 3 cm area of surrounding cellulitis. Very tender.  Psychiatric: She has a normal mood and affect. Her behavior is normal.  Nursing  note and vitals reviewed.   ED Course  INCISION AND DRAINAGE Date/Time: 06/04/2015 10:34 AM Performed by: Kathrynn SpeedHESS, Teofil Maniaci M Authorized by: Kathrynn SpeedHESS, Ayush Boulet M Consent: Verbal consent obtained. Consent given by: patient Patient understanding: patient states understanding of the procedure being performed Patient consent: the patient's understanding of the procedure matches consent given Procedure consent: procedure consent matches procedure scheduled Patient identity confirmed: verbally with patient and arm band Type: abscess Body area: upper extremity (R axilla) Anesthesia: local infiltration Local anesthetic: lidocaine 1% with epinephrine Anesthetic total: 10 ml Patient sedated: no Scalpel size: 11 Needle gauge: 25. Incision type: single straight Complexity: complex Drainage characteristics: none other than initial drainage on initial exam. Drainage amount: none.   (including critical care time)   Labs Review Labs Reviewed - No data to display  Imaging Review No results found.   EKG Interpretation None      MDM   Final diagnoses:  Hydradenitis  Abscess of right axilla   Non-toxic appearing, NAD. AFVSS. Abscess is large and fluctuant with surrounding cellulitis. Abscess appears to have a tract. I&D attempted with local anesthetic without any increased drainage from what was present on initial exam. Bedside ultrasound performed confirming abscess. After discussion and eval by Dr. Criss AlvineGoldston, bedside procedural sedation with ketamine and propofol done to attempt further I&D. See attached note. No drainage expressed. I spoke with General Surgery who will evaluate pt.  11:37 AM Patient seen by general surgery PA Foye SpurlingKelly Osborn who discussed with attending, and states this is hidradenitis, and suggest starting the patient on doxycycline twice daily for 3 weeks, and to follow-up with Dr. Magnus IvanBlackman with Gen. Surgery. Stable for d/c. Rx percocet along with doxy. Return precautions given.  Patient states understanding of treatment care plan and is agreeable.  Discussed with attending Dr. Criss AlvineGoldston who also evaluated patient and agrees with plan of care.   Kathrynn SpeedRobyn M Lovette Merta, PA-C 06/04/15 1139  Pricilla LovelessScott Goldston, MD 06/04/15 [phone]

## 2015-06-04 NOTE — ED Provider Notes (Signed)
Medical screening examination/treatment/procedure(s) were conducted as a shared visit with non-physician practitioner(s) and myself.  I personally evaluated the patient during the encounter.   EKG Interpretation None       Patient with swelling and drainage concerning for abscess. Nothing able to be drained despite procedural sedation. Likely all just hidradenitis. Consult surgery.   Procedural sedation Performed by: Pricilla LovelessGOLDSTON, Jaedynn Bohlken T Consent: Verbal consent obtained. Risks and benefits: risks, benefits and alternatives were discussed Required items: required blood products, implants, devices, and special equipment available Patient identity confirmed: arm band and provided demographic data Time out: Immediately prior to procedure a "time out" was called to verify the correct patient, procedure, equipment, support staff and site/side marked as required.  Sedation type: moderate (conscious) sedation NPO time confirmed and considedered  Sedatives: PROPOFOL and KETAMINE  Physician Time at Bedside: 10 minutes  Vitals: Vital signs were monitored during sedation. Cardiac Monitor, pulse oximeter Patient tolerance: Patient tolerated the procedure well with no immediate complications. Comments: Pt with uneventful recovered. Returned to pre-procedural sedation baseline     Pricilla LovelessScott Kaylena Pacifico, MD 06/04/15 1734

## 2015-06-04 NOTE — ED Notes (Addendum)
Patient coming from home with c/o of abscess under the right arm pit.  The area has "busted" and having scant yellowish drainage.  Patient states the area "busted" yesterday.

## 2015-08-12 ENCOUNTER — Encounter: Payer: Self-pay | Admitting: *Deleted

## 2015-09-05 ENCOUNTER — Other Ambulatory Visit: Payer: Medicaid Other | Admitting: Obstetrics & Gynecology

## 2015-09-12 ENCOUNTER — Other Ambulatory Visit: Payer: Medicaid Other | Admitting: Obstetrics & Gynecology

## 2015-09-12 ENCOUNTER — Encounter: Payer: Self-pay | Admitting: *Deleted

## 2016-02-01 ENCOUNTER — Ambulatory Visit: Payer: Medicaid Other | Admitting: Women's Health

## 2016-02-07 ENCOUNTER — Encounter: Payer: Self-pay | Admitting: Women's Health

## 2016-02-07 ENCOUNTER — Ambulatory Visit (INDEPENDENT_AMBULATORY_CARE_PROVIDER_SITE_OTHER): Payer: Medicaid Other | Admitting: Women's Health

## 2016-02-07 VITALS — BP 168/98 | HR 68 | Wt 264.0 lb

## 2016-02-07 DIAGNOSIS — A499 Bacterial infection, unspecified: Secondary | ICD-10-CM | POA: Diagnosis not present

## 2016-02-07 DIAGNOSIS — R197 Diarrhea, unspecified: Secondary | ICD-10-CM | POA: Diagnosis not present

## 2016-02-07 DIAGNOSIS — E669 Obesity, unspecified: Secondary | ICD-10-CM

## 2016-02-07 DIAGNOSIS — N760 Acute vaginitis: Secondary | ICD-10-CM | POA: Diagnosis not present

## 2016-02-07 DIAGNOSIS — B9689 Other specified bacterial agents as the cause of diseases classified elsewhere: Secondary | ICD-10-CM

## 2016-02-07 DIAGNOSIS — N920 Excessive and frequent menstruation with regular cycle: Secondary | ICD-10-CM | POA: Diagnosis not present

## 2016-02-07 DIAGNOSIS — I10 Essential (primary) hypertension: Secondary | ICD-10-CM | POA: Insufficient documentation

## 2016-02-07 DIAGNOSIS — E119 Type 2 diabetes mellitus without complications: Secondary | ICD-10-CM

## 2016-02-07 DIAGNOSIS — I152 Hypertension secondary to endocrine disorders: Secondary | ICD-10-CM | POA: Insufficient documentation

## 2016-02-07 LAB — POCT WET PREP (WET MOUNT): Clue Cells Wet Prep Whiff POC: POSITIVE

## 2016-02-07 MED ORDER — TRIAMTERENE-HCTZ 75-50 MG PO TABS: 1.0000 | ORAL_TABLET | Freq: Every day | ORAL | Status: DC

## 2016-02-07 MED ORDER — GLYBURIDE 5 MG PO TABS: 5.0000 mg | ORAL_TABLET | Freq: Two times a day (BID) | ORAL | Status: DC

## 2016-02-07 MED ORDER — METRONIDAZOLE 500 MG PO TABS: 500.0000 mg | ORAL_TABLET | Freq: Two times a day (BID) | ORAL | Status: DC

## 2016-02-07 NOTE — Patient Instructions (Signed)
Come back one morning this week to get your labs. Lab is open from 8am-5pm. They close daily from 12:30-1pm for lunch. Do not eat or drink anything after midnight the night before you come  Dr. Dwana MelenaZack Hall Dr. Syliva OvermanMargaret Simpson Dr. Sherrill RaringKwanta Wabash

## 2016-02-07 NOTE — Progress Notes (Signed)
Patient ID: Theresa Malone Sheer, female   DOB: Nov 16, 1981, 35 y.o.   MRN: [id number]   [facility]  Patient name: Theresa Malone Pelzel MRN [id number]  Date of birth: Nov 16, 1981  CC & HPI:  Theresa Malone Pursley is a 35 y.o. [id number] African American female presenting today for report of heavy periods and losing control of bowels since c/s and BTL 4530yr ago (01/24/15). Periods are regular q 28-30d, last 5-6d, heavy entire time- changes saturated super tampon and super pad q 2hrs, occ quarter-sized clots, +cramping, and weakness.  Denies abnormal d/c, itching/odor/irritatio or any change in sexual partners.  Also reports loose bm's q 2hrs every day for past year, occ will lose control of bowels while sleeping and wake up w/ small amt of stool. Denies abd pain. States she does see clear mucous in stools, no blood.  She also has CHTN and reports she has been off maxide for about 1 year.  Also has Type2DM and has been off of glyburide for about 1 year. Took herself off both. States she went to [place] in [place] who 'messed up her medicines' and hasn't taken since- does not like that office and isn't going back. So currently doesn't have PCP.  Patient's last menstrual period was 01/30/2016. The current method of family planning is tubal ligation. Last pap 07/14/14 neg w/ -HRHPV  Pertinent History Reviewed:  Medical & Surgical Hx:   Past medical, surgical, family, and social history reviewed in electronic medical record Medications: Reviewed & Updated - see associated section Allergies: Reviewed in electronic medical record  Objective Findings:  Vitals: BP 168/98 mmHg  Pulse 68  Wt 264 lb (119.75 kg)  LMP 01/30/2016 Body mass index is 49.91 kg/(m^2). 1st BP 174/106 Physical Examination: General appearance - alert, well appearing, and in no distress Abd: soft, nontender, no organomegaly Pelvic - external genitalia normal, cx very anterior and appears normal w/o polyps or abnormalities, mod amt thin  white malodorous d/c- no blood in vault Bimanual: no CMT, unable to adequately palpate uterus/ovaries d/t body habitus, no tenderness  Results for orders placed or performed in visit on 02/07/16 (from the past 24 hour(s))  POCT Wet Prep Mellody Drown(Wet [place])   Collection Time: 02/07/16 12:58 PM  Result Value Ref Range   Source Wet Prep POC vaginal    WBC, Wet Prep HPF POC none    Bacteria Wet Prep HPF POC None None, Few, Too numerous to count   BACTERIA WET PREP MORPHOLOGY POC     Clue Cells Wet Prep HPF POC Moderate (A) None, Too numerous to count   Clue Cells Wet Prep Whiff POC Positive Whiff    Yeast Wet Prep HPF POC None    KOH Wet Prep POC     Trichomonas Wet Prep HPF POC none      Assessment & Plan:  A:   BV  Menorrhagia w/ regular cycle  Morbid obesity  CHTN, took self off meds, not controlled  Type2DM, took self off meds  Frequent loose stools w/ occ loss of bowel control while sleeping  P:  Reordered maxide 75/50mg  daily- begin asap  Reordered glyburide 5mg  BID- begin asap  Rx metronidazole 500mg  BID x 7d for BV, no sex or etoh while taking   CBC, CMP, TSH, A1C, Lipids will come back one morning this week when fasting  Referral to GI for frequent loose stools x 8130yr/loss of bowel control while sleeping  Find PCP, gave list of local MDs  Return  for this week for pelvic u/s then f/u w/ me.  Marge Duncans CNM, [facility] 02/07/2016 1:00 PM

## 2016-02-08 ENCOUNTER — Ambulatory Visit (INDEPENDENT_AMBULATORY_CARE_PROVIDER_SITE_OTHER): Payer: Medicaid Other

## 2016-02-08 ENCOUNTER — Ambulatory Visit (INDEPENDENT_AMBULATORY_CARE_PROVIDER_SITE_OTHER): Payer: Medicaid Other | Admitting: Women's Health

## 2016-02-08 DIAGNOSIS — N920 Excessive and frequent menstruation with regular cycle: Secondary | ICD-10-CM

## 2016-02-08 NOTE — Progress Notes (Signed)
PELVIC US TA/TV: Normal homogenous anteverted uterus,normal ov's bilat (mobile),EEC 7.2 mm,no free fluid,no pain during ultrasound

## 2016-02-09 LAB — GC/CHLAMYDIA PROBE AMP
Chlamydia trachomatis, NAA: NEGATIVE
NEISSERIA GONORRHOEAE BY PCR: NEGATIVE

## 2016-02-13 ENCOUNTER — Encounter: Payer: Self-pay | Admitting: Internal Medicine

## 2016-02-14 ENCOUNTER — Encounter: Payer: Self-pay | Admitting: *Deleted

## 2016-02-14 ENCOUNTER — Ambulatory Visit: Payer: Medicaid Other | Admitting: Women's Health

## 2016-02-29 ENCOUNTER — Ambulatory Visit: Payer: Self-pay | Admitting: Gastroenterology

## 2016-02-29 ENCOUNTER — Telehealth: Payer: Self-pay | Admitting: Gastroenterology

## 2016-02-29 ENCOUNTER — Encounter: Payer: Self-pay | Admitting: Gastroenterology

## 2016-02-29 NOTE — Telephone Encounter (Signed)
Pt was a no show

## 2016-02-29 NOTE — Telephone Encounter (Signed)
Letter mailed

## 2016-03-13 ENCOUNTER — Telehealth: Payer: Self-pay | Admitting: Women's Health

## 2016-03-13 NOTE — Telephone Encounter (Signed)
Called pt, never showed for f/u appt to discuss pelvic u/s and menorrhagia. Notified pelvic u/s normal. Never came to get labs. Feels like one of medicines (either glyburide, maxide, or flagyl) was making her sick. Feels like glyburide is bottoming sugar out, but is not checking sugars. To come in am for fasting labs, then schedule appt w/ me next week to discuss options. Switched to front desk to schedule.  Cheral MarkerKimberly R. Braxten Memmer, CNM, [facility] 03/13/2016 1:27 PM

## 2016-03-21 ENCOUNTER — Ambulatory Visit: Payer: Medicaid Other | Admitting: Women's Health

## 2016-03-28 ENCOUNTER — Ambulatory Visit (INDEPENDENT_AMBULATORY_CARE_PROVIDER_SITE_OTHER): Payer: Medicaid Other | Admitting: Women's Health

## 2016-03-29 LAB — COMPREHENSIVE METABOLIC PANEL
ALBUMIN: 3.9 g/dL (ref 3.5–5.5)
ALK PHOS: 104 IU/L (ref 39–117)
ALT: 19 IU/L (ref 0–32)
AST: 20 IU/L (ref 0–40)
Albumin/Globulin Ratio: 1.3 (ref 1.2–2.2)
BILIRUBIN TOTAL: 0.3 mg/dL (ref 0.0–1.2)
BUN / CREAT RATIO: 12 (ref 9–23)
BUN: 8 mg/dL (ref 6–20)
CHLORIDE: 95 mmol/L — AB (ref 96–106)
CO2: 21 mmol/L (ref 18–29)
Calcium: 9.6 mg/dL (ref 8.7–10.2)
Creatinine, Ser: 0.65 mg/dL (ref 0.57–1.00)
GFR calc Af Amer: 136 mL/min/{1.73_m2} (ref >59)
GFR calc non Af Amer: 118 mL/min/{1.73_m2} (ref >59)
Globulin, Total: 3.1 g/dL (ref 1.5–4.5)
Glucose: 286 mg/dL — ABNORMAL HIGH (ref 65–99)
Potassium: 3.9 mmol/L (ref 3.5–5.2)
Sodium: 135 mmol/L (ref 134–144)
Total Protein: 7 g/dL (ref 6.0–8.5)

## 2016-03-29 LAB — CBC
Hematocrit: 36.3 % (ref 34.0–46.6)
Hemoglobin: 11.6 g/dL (ref 11.1–15.9)
MCH: 26.9 pg (ref 26.6–33.0)
MCHC: 32 g/dL (ref 31.5–35.7)
MCV: 84 fL (ref 79–97)
PLATELETS: 273 10*3/uL (ref 150–379)
RBC: 4.31 x10E6/uL (ref 3.77–5.28)
RDW: 15.2 % (ref 12.3–15.4)
WBC: 6.8 10*3/uL (ref 3.4–10.8)

## 2016-03-29 LAB — LIPID PANEL W/O CHOL/HDL RATIO
Cholesterol, Total: 187 mg/dL (ref 100–199)
HDL: 46 mg/dL (ref >39)
TRIGLYCERIDES: 470 mg/dL — AB (ref 0–149)

## 2016-03-29 LAB — HGB A1C W/O EAG: Hgb A1c MFr Bld: 10 % — ABNORMAL HIGH (ref 4.8–5.6)

## 2016-03-29 LAB — TSH: TSH: 1.93 u[IU]/mL (ref 0.450–4.500)

## 2016-04-02 ENCOUNTER — Ambulatory Visit: Payer: Medicaid Other | Admitting: Women's Health

## 2016-04-02 ENCOUNTER — Telehealth: Payer: Self-pay | Admitting: Women's Health

## 2016-04-02 DIAGNOSIS — E1165 Type 2 diabetes mellitus with hyperglycemia: Secondary | ICD-10-CM

## 2016-04-02 DIAGNOSIS — E119 Type 2 diabetes mellitus without complications: Secondary | ICD-10-CM

## 2016-04-02 DIAGNOSIS — IMO0002 Reserved for concepts with insufficient information to code with codable children: Secondary | ICD-10-CM

## 2016-04-02 DIAGNOSIS — E118 Type 2 diabetes mellitus with unspecified complications: Principal | ICD-10-CM

## 2016-04-02 NOTE — Telephone Encounter (Signed)
Pt missed appt today for f/u to discuss labs, states she didn't have a babysitter. Stopped taking maxide 75/50 daily and glyburide 5mg  bid right after I re-prescribed them in march d/t feeling like one was making her sick- she was also on flagyl at same time for BV- so likely could have been flagyl. Finally came to get labs 03/28/16: A1C 10.0 (up from 7.9 5083yr ago), total cholesterol 187, triglycerides 470, HDL 46. Discussed labs w/ [facility], triglycerides up d/t high A1C- to get back on meds (can try metformin 500mg  BID if need to if glyburide making her sick)-will resume glyburide for now and if makes her sick to call and let me know, recheck labs in 3mths. Recommended weight loss, decrease carbs/increase lean meats/vegetables, increase exercise, try Whole 30. Reiterated importance of resuming and staying on meds, losing weight, f/u in 3mths for repeat labs. To get PCP asap.  Cheral MarkerKimberly R. Kevyn Wengert, CNM, [facility] 04/02/2016 12:33 PM

## 2016-04-05 ENCOUNTER — Ambulatory Visit: Payer: Medicaid Other | Admitting: Advanced Practice Midwife

## 2016-04-17 ENCOUNTER — Telehealth: Payer: Self-pay | Admitting: *Deleted

## 2016-04-17 ENCOUNTER — Telehealth: Payer: Self-pay | Admitting: Women's Health

## 2016-04-17 NOTE — Telephone Encounter (Signed)
Pt called stating that she would like a call back from [place], Pt did not state the reason for her call. Please contact pt

## 2016-04-17 NOTE — Telephone Encounter (Signed)
Spoke with pt. Pt needs refill on strips to check her sugar. Will refill per Selena BattenKim, CNM. Encounter closed. JSY

## 2016-04-17 NOTE — Telephone Encounter (Signed)
Refill received for strips. Selena BattenKim, CNM gave prn refills and [facility]. JSY

## 2016-04-17 NOTE — Telephone Encounter (Signed)
Spoke with pt. Pt has an Accucheck Aviva machine and needs strips. She checks her sugar QID. I spoke with Selena BattenKim, CNM and she advised can do prn refills. I spoke with pharmacy and they require a hard copy so they will fax over script and I will have Kim sign and will fax back. JSY

## 2016-05-18 NOTE — Telephone Encounter (Signed)
done

## 2016-06-07 ENCOUNTER — Encounter (HOSPITAL_COMMUNITY): Payer: Self-pay | Admitting: *Deleted

## 2016-06-07 ENCOUNTER — Emergency Department (HOSPITAL_COMMUNITY): Payer: Self-pay

## 2016-06-07 ENCOUNTER — Emergency Department (HOSPITAL_COMMUNITY)
Admission: EM | Admit: 2016-06-07 | Discharge: 2016-06-07 | Disposition: A | Discharge status: Home / Self Care | Payer: Self-pay | Attending: Emergency Medicine | Admitting: Emergency Medicine

## 2016-06-07 DIAGNOSIS — E1165 Type 2 diabetes mellitus with hyperglycemia: Secondary | ICD-10-CM | POA: Insufficient documentation

## 2016-06-07 DIAGNOSIS — R42 Dizziness and giddiness: Secondary | ICD-10-CM

## 2016-06-07 DIAGNOSIS — I10 Essential (primary) hypertension: Secondary | ICD-10-CM | POA: Insufficient documentation

## 2016-06-07 DIAGNOSIS — R739 Hyperglycemia, unspecified: Secondary | ICD-10-CM

## 2016-06-07 DIAGNOSIS — Z5181 Encounter for therapeutic drug level monitoring: Secondary | ICD-10-CM | POA: Insufficient documentation

## 2016-06-07 DIAGNOSIS — R200 Anesthesia of skin: Secondary | ICD-10-CM

## 2016-06-07 DIAGNOSIS — Z7984 Long term (current) use of oral hypoglycemic drugs: Secondary | ICD-10-CM | POA: Insufficient documentation

## 2016-06-07 DIAGNOSIS — E669 Obesity, unspecified: Secondary | ICD-10-CM | POA: Insufficient documentation

## 2016-06-07 DIAGNOSIS — Z6841 Body Mass Index (BMI) 40.0 and over, adult: Secondary | ICD-10-CM | POA: Insufficient documentation

## 2016-06-07 DIAGNOSIS — Z79899 Other long term (current) drug therapy: Secondary | ICD-10-CM | POA: Insufficient documentation

## 2016-06-07 DIAGNOSIS — E876 Hypokalemia: Secondary | ICD-10-CM | POA: Insufficient documentation

## 2016-06-07 DIAGNOSIS — Z791 Long term (current) use of non-steroidal anti-inflammatories (NSAID): Secondary | ICD-10-CM | POA: Insufficient documentation

## 2016-06-07 LAB — I-STAT CHEM 8, ED
BUN: 5 mg/dL — ABNORMAL LOW (ref 6–20)
CALCIUM ION: 1.12 mmol/L — AB (ref 1.13–1.30)
CHLORIDE: 99 mmol/L — AB (ref 101–111)
Creatinine, Ser: 0.5 mg/dL (ref 0.44–1.00)
GLUCOSE: 239 mg/dL — AB (ref 65–99)
HCT: 31 % — ABNORMAL LOW (ref 36.0–46.0)
HEMOGLOBIN: 10.5 g/dL — AB (ref 12.0–15.0)
Potassium: 3.3 mmol/L — ABNORMAL LOW (ref 3.5–5.1)
SODIUM: 136 mmol/L (ref 135–145)
TCO2: 26 mmol/L (ref 0–100)

## 2016-06-07 LAB — COMPREHENSIVE METABOLIC PANEL
ALK PHOS: 100 U/L (ref 38–126)
ALT: 40 U/L (ref 14–54)
ANION GAP: 9 (ref 5–15)
AST: 43 U/L — ABNORMAL HIGH (ref 15–41)
Albumin: 3.4 g/dL — ABNORMAL LOW (ref 3.5–5.0)
BILIRUBIN TOTAL: 0.5 mg/dL (ref 0.3–1.2)
BUN: 7 mg/dL (ref 6–20)
CALCIUM: 8.1 mg/dL — AB (ref 8.9–10.3)
CO2: 26 mmol/L (ref 22–32)
Chloride: 98 mmol/L — ABNORMAL LOW (ref 101–111)
Creatinine, Ser: 0.57 mg/dL (ref 0.44–1.00)
Glucose, Bld: 235 mg/dL — ABNORMAL HIGH (ref 65–99)
POTASSIUM: 3.1 mmol/L — AB (ref 3.5–5.1)
Sodium: 133 mmol/L — ABNORMAL LOW (ref 135–145)
TOTAL PROTEIN: 6.7 g/dL (ref 6.5–8.1)

## 2016-06-07 LAB — CBC
HCT: 30.9 % — ABNORMAL LOW (ref 36.0–46.0)
HEMOGLOBIN: 10.4 g/dL — AB (ref 12.0–15.0)
MCH: 27.9 pg (ref 26.0–34.0)
MCHC: 33.7 g/dL (ref 30.0–36.0)
MCV: 82.8 fL (ref 78.0–100.0)
PLATELETS: 252 10*3/uL (ref 150–400)
RBC: 3.73 MIL/uL — AB (ref 3.87–5.11)
RDW: 14.8 % (ref 11.5–15.5)
WBC: 6.9 10*3/uL (ref 4.0–10.5)

## 2016-06-07 LAB — DIFFERENTIAL
Basophils Absolute: 0 10*3/uL (ref 0.0–0.1)
Basophils Relative: 0 %
EOS ABS: 0.3 10*3/uL (ref 0.0–0.7)
EOS PCT: 4 %
LYMPHS ABS: 1.9 10*3/uL (ref 0.7–4.0)
LYMPHS PCT: 27 %
MONO ABS: 0.3 10*3/uL (ref 0.1–1.0)
Monocytes Relative: 5 %
NEUTROS PCT: 64 %
Neutro Abs: 4.4 10*3/uL (ref 1.7–7.7)

## 2016-06-07 LAB — I-STAT TROPONIN, ED: TROPONIN I, POC: 0 ng/mL (ref 0.00–0.08)

## 2016-06-07 LAB — PROTIME-INR
INR: 0.97 (ref 0.00–1.49)
PROTHROMBIN TIME: 13.1 s (ref 11.6–15.2)

## 2016-06-07 LAB — CBG MONITORING, ED: GLUCOSE-CAPILLARY: 254 mg/dL — AB (ref 65–99)

## 2016-06-07 LAB — APTT: aPTT: 31 seconds (ref 24–37)

## 2016-06-07 MED ORDER — POTASSIUM CHLORIDE CRYS ER 20 MEQ PO TBCR
40.0000 meq | EXTENDED_RELEASE_TABLET | Freq: Once | ORAL | Status: AC
  Administered 2016-06-07: 40 meq via ORAL
  Filled 2016-06-07: qty 2

## 2016-06-07 NOTE — ED Provider Notes (Signed)
CSN: [id number]     Arrival date & time 06/07/16  2103 History  By signing my name below, I, [name], attest that this documentation has been prepared under the direction and in the presence of Vanetta Mulders, MD. Electronically signed, [name], ED Scribe. 06/07/2016. 10:44 PM.     Chief Complaint  Patient presents with  . Dizziness    @EDPCLEARED @ The history is provided by the patient. No language interpreter was used.   HPI Comments: Theresa Malone is a 35 y.o. female with a PMHx of HTN, DM, Obesity, who presents to the Emergency Department complaining of gradual onset numbness to the left side of her face and left upper extremity, onset four hours ago. Pt states that her right side feels normal. Pt also complains of associated lightheadedness. Pt further states she has felt short of breath recently. Pt reports a Hx of similar symptoms at age 74 and was seen at an urgent care. Pt states "They said I had a mini stroke". Pt states she did not have a CT scan after prior episode. Pt denies any pain or weakness.   Past Medical History  Diagnosis Date  . Hypertension   . Diabetes mellitus   . Anxiety     panic attacks  . Obesity    Past Surgical History  Procedure Laterality Date  . Cesarean section      C/S x 2  . Cesarean section N/A 01/24/2015    Procedure: CESAREAN SECTION;  Surgeon: Catalina Antigua, MD;  Location: [facility];  Service: Obstetrics;  Laterality: N/A;  . Tubal ligation     Family History  Problem Relation Age of Onset  . Diabetes Paternal Grandfather   . Cancer Paternal Grandfather     liver  . Cancer Paternal Grandmother     liver  . Hypertension Father   . Diabetes Mother   . Hypertension Mother   . Diabetes Maternal Uncle   . Diabetes Maternal Grandmother   . Cancer Maternal Grandfather   . Diabetes Daughter     boarderline   . Asthma Daughter   . Bronchitis Daughter   . Bronchitis Son   . Bronchitis Daughter    Social History  Substance Use  Topics  . Smoking status: Never Smoker   . Smokeless tobacco: Never Used  . Alcohol Use: No     Comment: occ   OB History    Gravida Para Term Preterm AB TAB SAB Ectopic Multiple Living   4 4 2 2      0 4     Review of Systems  Constitutional: Negative for fever and chills.  HENT: Negative for rhinorrhea.   Eyes: Positive for visual disturbance (blurred).  Respiratory: Positive for cough and shortness of breath.   Cardiovascular: Negative for chest pain.  Gastrointestinal: Positive for abdominal pain. Negative for nausea, vomiting and diarrhea.  Genitourinary: Negative for dysuria.  Musculoskeletal: Positive for joint swelling. Negative for back pain.  Skin: Positive for rash (stomach).  Neurological: Positive for dizziness, numbness and headaches. Negative for syncope, speech difficulty and weakness.  Hematological: Does not bruise/bleed easily.  Psychiatric/Behavioral: Negative for confusion.  All other systems reviewed and are negative.     Allergies  Lisinopril  Home Medications   Prior to Admission medications   Medication Sig Start Date End Date Taking? Authorizing Provider  glyBURIDE (DIABETA) 5 MG tablet Take 1 tablet (5 mg total) by mouth 2 (two) times daily with a meal. 02/07/16  Yes Merlene Laughter  Booker, CNM  naproxen sodium (ANAPROX) 220 MG tablet Take 440 mg by mouth daily as needed (headache).   Yes Historical Provider, MD  triamterene-hydrochlorothiazide (MAXZIDE) 75-50 MG tablet Take 1 tablet by mouth daily. 02/07/16  Yes Cheral Marker, CNM  metroNIDAZOLE (FLAGYL) 500 MG tablet Take 1 tablet (500 mg total) by mouth 2 (two) times daily. Patient not taking: Reported on 06/07/2016 02/07/16   Cheral Marker, CNM   BP 135/80 mmHg  Pulse 92  Temp(Src) 99.1 F (37.3 C) (Oral)  Resp 24  Ht  (1.575 m)  Wt 122.471 kg  BMI 49.37 kg/m2  SpO2 99%  LMP 05/20/2016 Physical Exam  Constitutional: She is oriented to person, place, and time. She appears  well-developed and well-nourished. No distress.  HENT:  Head: Normocephalic and atraumatic.  Eyes: Conjunctivae and EOM are normal. Pupils are equal, round, and reactive to light.  Neck: Normal range of motion.  Cardiovascular: Normal rate, regular rhythm and intact distal pulses.   Cap refill to big toes 1 sec.  Pulmonary/Chest: Effort normal and breath sounds normal.  100% RA  Abdominal: Bowel sounds are normal. There is no tenderness.  Musculoskeletal: She exhibits no edema.  Neurological: She is alert and oriented to person, place, and time. She has normal reflexes. No cranial nerve deficit. She exhibits normal muscle tone. Coordination normal.  No pronator drift  Skin: Skin is warm and dry. She is not diaphoretic.  5mm skin cyst to abdomen  Psychiatric: She has a normal mood and affect. Judgment normal.  Nursing note and vitals reviewed.   ED Course  Procedures  DIAGNOSTIC STUDIES: Oxygen Saturation is 100% on RA, normal by my interpretation.  COORDINATION OF CARE: 9:48 PM-Will order CT. Discussed treatment plan with pt at bedside and pt agreed to plan.   Labs Review Labs Reviewed  CBC - Abnormal; Notable for the following:    RBC 3.73 (*)    Hemoglobin 10.4 (*)    HCT 30.9 (*)    All other components within normal limits  COMPREHENSIVE METABOLIC PANEL - Abnormal; Notable for the following:    Sodium 133 (*)    Potassium 3.1 (*)    Chloride 98 (*)    Glucose, Bld 235 (*)    Calcium 8.1 (*)    Albumin 3.4 (*)    AST 43 (*)    All other components within normal limits  CBG MONITORING, ED - Abnormal; Notable for the following:    Glucose-Capillary 254 (*)    All other components within normal limits  I-STAT CHEM 8, ED - Abnormal; Notable for the following:    Potassium 3.3 (*)    Chloride 99 (*)    BUN 5 (*)    Glucose, Bld 239 (*)    Calcium, Ion 1.12 (*)    Hemoglobin 10.5 (*)    HCT 31.0 (*)    All other components within normal limits  PROTIME-INR  APTT   DIFFERENTIAL  I-STAT TROPOININ, ED   Results for orders placed or performed during the hospital encounter of 06/07/16  Protime-INR  Result Value Ref Range   Prothrombin Time 13.1 11.6 - 15.2 seconds   INR 0.97 0.00 - 1.49  APTT  Result Value Ref Range   aPTT 31 24 - 37 seconds  CBC  Result Value Ref Range   WBC 6.9 4.0 - 10.5 K/uL   RBC 3.73 (L) 3.87 - 5.11 MIL/uL   Hemoglobin 10.4 (L) 12.0 - 15.0 g/dL  HCT 30.9 (L) 36.0 - 46.0 %   MCV 82.8 78.0 - 100.0 fL   MCH 27.9 26.0 - 34.0 pg   MCHC 33.7 30.0 - 36.0 g/dL   RDW 16.114.8 09.611.5 - 04.515.5 %   Platelets 252 150 - 400 K/uL  Differential  Result Value Ref Range   Neutrophils Relative % 64 %   Neutro Abs 4.4 1.7 - 7.7 K/uL   Lymphocytes Relative 27 %   Lymphs Abs 1.9 0.7 - 4.0 K/uL   Monocytes Relative 5 %   Monocytes Absolute 0.3 0.1 - 1.0 K/uL   Eosinophils Relative 4 %   Eosinophils Absolute 0.3 0.0 - 0.7 K/uL   Basophils Relative 0 %   Basophils Absolute 0.0 0.0 - 0.1 K/uL  Comprehensive metabolic panel  Result Value Ref Range   Sodium 133 (L) 135 - 145 mmol/L   Potassium 3.1 (L) 3.5 - 5.1 mmol/L   Chloride 98 (L) 101 - 111 mmol/L   CO2 26 22 - 32 mmol/L   Glucose, Bld 235 (H) 65 - 99 mg/dL   BUN 7 6 - 20 mg/dL   Creatinine, Ser 4.090.57 0.44 - 1.00 mg/dL   Calcium 8.1 (L) 8.9 - 10.3 mg/dL   Total Protein 6.7 6.5 - 8.1 g/dL   Albumin 3.4 (L) 3.5 - 5.0 g/dL   AST 43 (H) 15 - 41 U/L   ALT 40 14 - 54 U/L   Alkaline Phosphatase 100 38 - 126 U/L   Total Bilirubin 0.5 0.3 - 1.2 mg/dL   GFR calc non Af Amer >60 >60 mL/min   GFR calc Af Amer >60 >60 mL/min   Anion gap 9 5 - 15  CBG monitoring, ED  Result Value Ref Range   Glucose-Capillary 254 (H) 65 - 99 mg/dL  I-Stat Chem 8, ED  Result Value Ref Range   Sodium 136 135 - 145 mmol/L   Potassium 3.3 (L) 3.5 - 5.1 mmol/L   Chloride 99 (L) 101 - 111 mmol/L   BUN 5 (L) 6 - 20 mg/dL   Creatinine, Ser 8.110.50 0.44 - 1.00 mg/dL   Glucose, Bld 914239 (H) 65 - 99 mg/dL   Calcium,  Ion 7.821.12 (L) 1.13 - 1.30 mmol/L   TCO2 26 0 - 100 mmol/L   Hemoglobin 10.5 (L) 12.0 - 15.0 g/dL   HCT 95.631.0 (L) 21.336.0 - 08.646.0 %     Imaging Review Ct Head Code Stroke W/o Cm  06/07/2016  CLINICAL DATA:  35 year old presenting with acute onset of dizziness, left facial numbness and left arm numbness that began at 5 o'clock this afternoon. Patient states she had a headache yesterday but not today. EXAM: CT HEAD WITHOUT CONTRAST TECHNIQUE: Contiguous axial images were obtained from the base of the skull through the vertex without intravenous contrast. COMPARISON:  None. FINDINGS: Patient's head is tilted to the right in the gantry. Insert ventricles cavum septum pellucidum, a normal anatomic variant. No mass lesion. No midline shift. No acute hemorrhage or hematoma. No extra-axial fluid collections. No evidence of acute cortical stroke. No focal brain parenchymal abnormality. No skull fractures identified. No intrinsic osseous abnormality. Visualized paranasal sinuses well aerated. Nasal bones intact. Normal-appearing sella turcica. CT Visualized paranasal sinuses, bilateral mastoid air cells and bilateral middle ear cavities well-aerated. IMPRESSION: Normal examination. Electronically Signed   By: Hulan Saashomas  Lawrence M.D.   On: 06/07/2016 21:36   I have personally reviewed and evaluated these images and lab results as part of my medical decision-making.  EKG Interpretation None        ED ECG REPORT   Date: 06/07/2016  Rate: 92  Rhythm: normal sinus rhythm  QRS Axis: normal  Intervals: normal  ST/T Wave abnormalities: normal  Conduction Disutrbances:none  Narrative Interpretation:   Old EKG Reviewed: none available  I have personally reviewed the EKG tracing and agree with the computerized printout as noted.  MDM   Final diagnoses:  Dizziness  Hyperglycemia  Hypokalemia  Numbness   Patient presents with several complaints. Left side facial numbness left arm numbness left leg  numbness left-sided body numbness. No headache. No speech problems no weakness. Patient's symptoms do not correlate neurologically with a stroke type symptoms. Associated with dizziness as well lightheadedness but no vertigo. Patient's workup here is negative. Other than the finding of hyperglycemia she is a known diabetic. Also mild hypokalemia. Patient treated with 40 mEq of potassium orally here.  Patient stable for discharge home. Head CT was negative. If symptoms persist will require MRI. Patient referred to the [facility] family practice center.  Patient will return immediately for any new or worse neurological symptoms.  Do not feel symptoms are consistent with CVA.  I personally performed the services described in this documentation, which was scribed in my presence. The recorded information has been reviewed and is accurate.      Vanetta Mulders, MD 06/07/16 2322

## 2016-06-07 NOTE — ED Notes (Signed)
MD at bedside. 

## 2016-06-07 NOTE — ED Notes (Signed)
Patient transported to CT 

## 2016-06-07 NOTE — ED Notes (Addendum)
Pt c/o dizziness with left side facial numbness and left arm numbness that started at 5pm this afternoon, denies any headache today but reports that she had one yesterday,

## 2016-06-07 NOTE — Discharge Instructions (Signed)
Recommend follow-up that [facility]. For your blood sugars. Return for any new or worse symptoms particularly in the muscle weakness any speech problems visual changes. Today's workup without any acute findings.

## 2016-06-08 NOTE — Progress Notes (Signed)
Code stroke Beeped   No beep Order entered   920pm Pt. In ct   929 Exam ended   934 Radiologist called  936  Soc    936

## 2016-10-15 ENCOUNTER — Other Ambulatory Visit: Payer: Self-pay | Admitting: Women's Health

## 2016-11-14 ENCOUNTER — Encounter (INDEPENDENT_AMBULATORY_CARE_PROVIDER_SITE_OTHER): Payer: Self-pay | Admitting: Internal Medicine

## 2016-12-03 ENCOUNTER — Encounter (INDEPENDENT_AMBULATORY_CARE_PROVIDER_SITE_OTHER): Payer: Self-pay | Admitting: Internal Medicine

## 2016-12-03 ENCOUNTER — Ambulatory Visit (INDEPENDENT_AMBULATORY_CARE_PROVIDER_SITE_OTHER): Payer: Self-pay | Admitting: Internal Medicine

## 2017-05-14 ENCOUNTER — Emergency Department (HOSPITAL_COMMUNITY)
Admission: EM | Admit: 2017-05-14 | Discharge: 2017-05-15 | Disposition: A | Discharge status: Home / Self Care | Payer: Medicaid Other | Attending: Emergency Medicine | Admitting: Emergency Medicine

## 2017-05-14 ENCOUNTER — Encounter (HOSPITAL_COMMUNITY): Payer: Self-pay | Admitting: *Deleted

## 2017-05-14 DIAGNOSIS — R739 Hyperglycemia, unspecified: Secondary | ICD-10-CM

## 2017-05-14 DIAGNOSIS — B349 Viral infection, unspecified: Secondary | ICD-10-CM

## 2017-05-14 DIAGNOSIS — I10 Essential (primary) hypertension: Secondary | ICD-10-CM | POA: Insufficient documentation

## 2017-05-14 DIAGNOSIS — E1165 Type 2 diabetes mellitus with hyperglycemia: Secondary | ICD-10-CM | POA: Insufficient documentation

## 2017-05-14 MED ORDER — ACETAMINOPHEN 325 MG PO TABS
650.0000 mg | ORAL_TABLET | Freq: Once | ORAL | Status: AC
  Administered 2017-05-15: 650 mg via ORAL
  Filled 2017-05-14: qty 2

## 2017-05-14 NOTE — ED Triage Notes (Signed)
PT C/O headache that started today, fever, sore throat and pain behind bilateral ears that started yesterday, denies any n/v. States that she checked her blood pressure tonight and it was elevated at home 162/112

## 2017-05-15 LAB — I-STAT CHEM 8, ED
BUN: 6 mg/dL (ref 6–20)
CREATININE: 0.5 mg/dL (ref 0.44–1.00)
Calcium, Ion: 1.21 mmol/L (ref 1.15–1.40)
Chloride: 94 mmol/L — ABNORMAL LOW (ref 101–111)
GLUCOSE: 255 mg/dL — AB (ref 65–99)
HCT: 32 % — ABNORMAL LOW (ref 36.0–46.0)
HEMOGLOBIN: 10.9 g/dL — AB (ref 12.0–15.0)
Potassium: 3.5 mmol/L (ref 3.5–5.1)
Sodium: 135 mmol/L (ref 135–145)
TCO2: 25 mmol/L (ref 0–100)

## 2017-05-15 MED ORDER — HYDROCHLOROTHIAZIDE 25 MG PO TABS: 25.0000 mg | ORAL_TABLET | Freq: Every day | ORAL | 0 refills | Status: DC

## 2017-05-15 MED ORDER — METFORMIN HCL 500 MG PO TABS: 500.0000 mg | ORAL_TABLET | Freq: Two times a day (BID) | ORAL | 0 refills | Status: DC

## 2017-05-15 NOTE — ED Provider Notes (Signed)
[facility] Provider Note   CSN: [id number] Arrival date & time: 05/14/17  2302     History   Chief Complaint Chief Complaint  Patient presents with  . Headache    HPI Theresa Malone is a 36 y.o. female.  The history is provided by the patient.  Headache   This is a new problem. The current episode started yesterday. The problem occurs constantly. The problem has not changed since onset.Associated with: cough/sore throat/ear pain. The pain is moderate. Associated symptoms include a fever. Pertinent negatives include no vomiting.  pt reports sore throat, cough, ear pain and HA and fever since yesterday No vomiting No new diarrhea No cp/sob No focal weakness  She reports she was concerned for possible stroke after checking her blood pressure, but denies focal weakness/slurred speech She is supposed to be on meds for DM/HTN but not currently taking them  Past Medical History:  Diagnosis Date  . Anxiety    panic attacks  . Diabetes mellitus   . Hypertension   . Obesity     Patient Active Problem List   Diagnosis Date Noted  . Type 2 diabetes mellitus, uncontrolled 02/07/2016  . Chronic hypertension 02/07/2016  . Menorrhagia with regular cycle 02/07/2016  . Frequent loose stools 02/07/2016  . Status post cesarean section 01/25/2015  . Hidradenitis axillaris 09/15/2014  . Susceptible to varicella (non-immune), currently pregnant 07/24/2014  . Morbid obesity with BMI of 45.0-49.9, adult (HCC) 03/03/2014    Past Surgical History:  Procedure Laterality Date  . CESAREAN SECTION     C/S x 2  . CESAREAN SECTION N/A 01/24/2015   Procedure: CESAREAN SECTION;  Surgeon: Catalina Antigua, MD;  Location: [facility];  Service: Obstetrics;  Laterality: N/A;  . TUBAL LIGATION      OB History    Gravida Para Term Preterm AB Living   4 4 2 2   4    SAB TAB Ectopic Multiple Live Births         0 4       Home Medications    Prior to Admission medications   Medication  Sig Start Date End Date Taking? Authorizing Provider  hydrochlorothiazide (HYDRODIURIL) 25 MG tablet Take 1 tablet (25 mg total) by mouth daily. 05/15/17   Zadie Rhine, MD  metFORMIN (GLUCOPHAGE) 500 MG tablet Take 1 tablet (500 mg total) by mouth 2 (two) times daily with a meal. 05/15/17   Zadie Rhine, MD    Family History Family History  Problem Relation Age of Onset  . Diabetes Paternal Grandfather   . Cancer Paternal Grandfather        liver  . Cancer Paternal Grandmother        liver  . Hypertension Father   . Diabetes Mother   . Hypertension Mother   . Diabetes Maternal Uncle   . Diabetes Maternal Grandmother   . Cancer Maternal Grandfather   . Diabetes Daughter        boarderline   . Asthma Daughter   . Bronchitis Daughter   . Bronchitis Son   . Bronchitis Daughter     Social History Social History  Substance Use Topics  . Smoking status: Never Smoker  . Smokeless tobacco: Never Used  . Alcohol use No     Comment: occ     Allergies   Lisinopril   Review of Systems Review of Systems  Constitutional: Positive for fever.  HENT: Positive for sore throat.   Respiratory: Positive for cough.  Cardiovascular: Negative for chest pain.  Gastrointestinal: Negative for vomiting.  Neurological: Positive for headaches. Negative for speech difficulty.  All other systems reviewed and are negative.    Physical Exam Updated Vital Signs BP (!) 141/87 (BP Location: Left Leg)   Pulse (!) 101   Temp 99.3 F (37.4 C) (Oral)   Resp 18   Ht 1.549 m (5\' 1" )   Wt 107 kg (236 lb)   LMP 05/05/2017   SpO2 97%   BMI 44.59 kg/m   Physical Exam CONSTITUTIONAL: Well developed/well nourished HEAD: Normocephalic/atraumatic EYES: EOMI/PERRL, no nystagmus, no ptosis ENMT: Mucous membranes moist, uvula midline, no erythema/exudates.  Bilateral TM clear/intact NECK: supple no meningeal signs, no bruits SPINE/BACK:entire spine nontender CV: S1/S2 noted, no  murmurs/rubs/gallops noted LUNGS: Lungs are clear to auscultation bilaterally, no apparent distress ABDOMEN: soft, nontender, no rebound or guarding GU:no cva tenderness NEURO:Awake/alert, face symmetric, no arm or leg drift is noted Equal 5/5 strength with shoulder abduction, elbow flex/extension, wrist flex/extension in upper extremities and equal hand grips bilaterally Equal 5/5 strength with hip flexion,knee flex/extension, foot dorsi/plantar flexion Cranial nerves 3/4/5/6/06/03/09/11/12 tested and intact Gait normal without ataxia Sensation to light touch intact in all extremities EXTREMITIES: pulses normal, full ROM SKIN: warm, color normal PSYCH: no abnormalities of mood noted, alert and oriented to situation    ED Treatments / Results  Labs (all labs ordered are listed, but only abnormal results are displayed) Labs Reviewed  I-STAT CHEM 8, ED - Abnormal; Notable for the following:       Result Value   Chloride 94 (*)    Glucose, Bld 255 (*)    Hemoglobin 10.9 (*)    HCT 32.0 (*)    All other components within normal limits    EKG  EKG Interpretation None       Radiology No results found.  Procedures Procedures (including critical care time)  Medications Ordered in ED Medications  acetaminophen (TYLENOL) tablet 650 mg (650 mg Oral Given 05/15/17 0006)     Initial Impression / Assessment and Plan / ED Course  I have reviewed the triage vital signs and the nursing notes.  Pertinent labs   results that were available during my care of the patient were reviewed by me and considered in my medical decision making (see chart for details).     Pt well appearing No signs of stroke Suspect viral illness causing HA/sore throat/cough/ear pain  Will restart meds for DM/HTN She has PCP followup already arranged this month We discussed strict ER return precautions   Final Clinical Impressions(s) / ED Diagnoses   Final diagnoses:  Viral illness  Essential  hypertension  Hyperglycemia    New Prescriptions Discharge Medication List as of 05/15/2017 12:33 AM    START taking these medications   Details  hydrochlorothiazide (HYDRODIURIL) 25 MG tablet Take 1 tablet (25 mg total) by mouth daily., Starting Wed 05/15/2017, Print    metFORMIN (GLUCOPHAGE) 500 MG tablet Take 1 tablet (500 mg total) by mouth 2 (two) times daily with a meal., Starting Wed 05/15/2017, Print         Zadie RhineWickline, Kitzia Camus, MD 05/15/17 [id number]

## 2017-05-28 ENCOUNTER — Encounter: Payer: Self-pay | Admitting: Obstetrics & Gynecology

## 2018-02-18 ENCOUNTER — Ambulatory Visit: Payer: Self-pay | Admitting: Advanced Practice Midwife

## 2018-02-27 ENCOUNTER — Ambulatory Visit: Payer: Self-pay | Admitting: Advanced Practice Midwife

## 2018-03-13 ENCOUNTER — Encounter: Payer: Self-pay | Admitting: Advanced Practice Midwife

## 2018-03-13 ENCOUNTER — Ambulatory Visit: Payer: 59 | Admitting: Advanced Practice Midwife

## 2018-03-13 VITALS — BP 102/70 | HR 104 | Ht 61.0 in | Wt 231.0 lb

## 2018-03-13 DIAGNOSIS — Z7984 Long term (current) use of oral hypoglycemic drugs: Secondary | ICD-10-CM | POA: Diagnosis not present

## 2018-03-13 DIAGNOSIS — B373 Candidiasis of vulva and vagina: Secondary | ICD-10-CM | POA: Diagnosis not present

## 2018-03-13 DIAGNOSIS — B3731 Acute candidiasis of vulva and vagina: Secondary | ICD-10-CM | POA: Insufficient documentation

## 2018-03-13 DIAGNOSIS — E119 Type 2 diabetes mellitus without complications: Secondary | ICD-10-CM | POA: Diagnosis not present

## 2018-03-13 DIAGNOSIS — L732 Hidradenitis suppurativa: Secondary | ICD-10-CM

## 2018-03-13 MED ORDER — FLUCONAZOLE 150 MG PO TABS: ORAL_TABLET | ORAL | 2 refills | Status: DC

## 2018-03-13 NOTE — Progress Notes (Signed)
[facility] Visit  Patient name: Theresa Malone MRN [id number]  Date of birth: June 22, 1981  CC & HPI:  Theresa Malone is a 37 y.o. African American female presenting today for chronic yeast. For the past year, has suffered w/ what she thinks ar eyeast infections.  She will use Monistat, get some relief, then within a week or so, it will come back.  Is Type 2 diabetic, hast Hgb A1c 11+.  Working w/PCP to get it down. Also suffers from hidradenitis (axillary and groin).    Pertinent History Reviewed:  Medical & Surgical Hx:   Past Medical History:  Diagnosis Date  . Anxiety    panic attacks  . Depression   . Diabetes mellitus   . Hypertension   . Obesity    Past Surgical History:  Procedure Laterality Date  . CESAREAN SECTION     C/S x 2  . CESAREAN SECTION N/A 01/24/2015   Procedure: CESAREAN SECTION;  Surgeon: Catalina Antigua, MD;  Location: [facility];  Service: Obstetrics;  Laterality: N/A;  . TUBAL LIGATION     Family History  Problem Relation Age of Onset  . Diabetes Paternal Grandfather   . Cancer Paternal Grandfather        liver  . Cancer Paternal Grandmother        liver  . Hypertension Father   . Diabetes Mother   . Hypertension Mother   . Diabetes Maternal Uncle   . Diabetes Maternal Grandmother   . Cancer Maternal Grandfather   . Diabetes Daughter        boarderline   . Asthma Daughter   . Bronchitis Daughter   . Bronchitis Son   . Bronchitis Daughter     Current Outpatient Medications:  .  glyBURIDE (DIABETA) 5 MG tablet, Take 10 mg by mouth daily. , Disp: , Rfl:  .  triamterene-hydrochlorothiazide (MAXZIDE) 75-50 MG tablet, Take 1 tablet by mouth daily., Disp: , Rfl:  .  UNABLE TO FIND, Anti-depressant-takes 2 daily, Disp: , Rfl:  .  fluconazole (DIFLUCAN) 150 MG tablet, Daily for 10 days, Disp: 10 tablet, Rfl: 2 Social History: Reviewed -  reports that she has never smoked. She has never used smokeless tobacco.  Review of Systems:    Constitutional: Negative for fever and chills Eyes: Negative for visual disturbances Respiratory: Negative for shortness of breath, dyspnea Cardiovascular: Negative for chest pain or palpitations  Gastrointestinal: Negative for vomiting, diarrhea and constipation; no abdominal pain Genitourinary: Negative for dysuria and urgencyMusculoskeletal: Negative for back pain, joint pain, myalgias  Neurological: Negative for dizziness and headaches    Objective Findings:    Physical Examination: Vitals:   03/13/18 1042  BP: 102/70  Pulse: (!) 104  General appearance - well appearing, and in no distress Mental status - alert, oriented to person, place, and time Chest:  Normal respiratory effort Heart - normal rate and regular rhythm Abdomen:  Soft, nontender Pelvic: Vulvar skin discolored w/yeast (co-exam w/LHE). Small yeast plaques in vagina.  Musculoskeletal:  Normal range of motion without pain Extremities:  No edema    No results found for this or any previous visit (from the past 24 hour(s)).    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  Rx diflucan 105mg  qd for 10 days  Probiotics for prevention   Return in about 2 weeks (around 03/27/2018) for check yeast.  Jacklyn ShellFrances Cresenzo-Dishmon CNM 03/13/2018 1:59 PM

## 2018-03-13 NOTE — Patient Instructions (Signed)
Gentian Violet  Probiotic:    UP4 ADULT Superior, compatible and safe strains DDS  -1 L.acidophilus (super strain) with B. Longum, B.Bifidum & B.Infantis Acid-resistant-survives stomach acid and Bile salts   Fortified with prebiotic Fructooligosaccharide to enhance growth and performance Potency: 15 Billion CFU/capsule Dosage: 1 capsule daily, best before a meal.  [employer]:  $21.90 for 60 caps   iF THERE IS STILL A PROBLEM ADD,   FLORAJEN ACIDOPHILUS High Potency Acidophilus Florajen high potency acidophilus is especially effective for restoring and maintaining a healthy, comfortable balance of vaginal flora. It is also beneficial for overall intestinal health and maintaining the immune system. 20 Billion live cultures per capsule Available in 30 and 60 capsules   [employer]:  $19.99 for 60 caps

## 2018-03-27 ENCOUNTER — Ambulatory Visit: Payer: 59 | Admitting: Advanced Practice Midwife

## 2018-04-15 ENCOUNTER — Ambulatory Visit: Payer: 59 | Admitting: Advanced Practice Midwife

## 2018-07-20 ENCOUNTER — Encounter: Payer: Self-pay | Admitting: Emergency Medicine

## 2018-07-20 ENCOUNTER — Other Ambulatory Visit: Payer: Self-pay

## 2018-07-20 ENCOUNTER — Emergency Department
Admission: EM | Admit: 2018-07-20 | Discharge: 2018-07-20 | Disposition: A | Discharge status: Home / Self Care | Payer: 59 | Attending: Emergency Medicine | Admitting: Emergency Medicine

## 2018-07-20 DIAGNOSIS — K047 Periapical abscess without sinus: Secondary | ICD-10-CM | POA: Diagnosis not present

## 2018-07-20 DIAGNOSIS — Z79899 Other long term (current) drug therapy: Secondary | ICD-10-CM | POA: Insufficient documentation

## 2018-07-20 DIAGNOSIS — E119 Type 2 diabetes mellitus without complications: Secondary | ICD-10-CM | POA: Insufficient documentation

## 2018-07-20 DIAGNOSIS — K0889 Other specified disorders of teeth and supporting structures: Secondary | ICD-10-CM | POA: Diagnosis not present

## 2018-07-20 DIAGNOSIS — I10 Essential (primary) hypertension: Secondary | ICD-10-CM | POA: Insufficient documentation

## 2018-07-20 MED ORDER — IBUPROFEN 800 MG PO TABS: 800.0000 mg | ORAL_TABLET | Freq: Three times a day (TID) | ORAL | 0 refills | Status: DC | PRN

## 2018-07-20 MED ORDER — BUPIVACAINE HCL (PF) 0.5 % IJ SOLN
5.0000 mL | Freq: Once | INTRAMUSCULAR | Status: AC
  Administered 2018-07-20: 5 mL

## 2018-07-20 MED ORDER — LIDOCAINE HCL (PF) 1 % IJ SOLN
5.0000 mL | Freq: Once | INTRAMUSCULAR | Status: AC
  Administered 2018-07-20: 5 mL
  Filled 2018-07-20: qty 5

## 2018-07-20 MED ORDER — AMOXICILLIN-POT CLAVULANATE 875-125 MG PO TABS: 1.0000 | ORAL_TABLET | Freq: Two times a day (BID) | ORAL | 0 refills | Status: AC

## 2018-07-20 MED ORDER — TRAMADOL HCL 50 MG PO TABS: 50.0000 mg | ORAL_TABLET | Freq: Four times a day (QID) | ORAL | 0 refills | Status: DC | PRN

## 2018-07-20 MED ORDER — AMOXICILLIN 500 MG PO CAPS
1000.0000 mg | ORAL_CAPSULE | Freq: Once | ORAL | Status: AC
  Administered 2018-07-20: 1000 mg via ORAL
  Filled 2018-07-20: qty 2

## 2018-07-20 MED ORDER — IBUPROFEN 800 MG PO TABS
800.0000 mg | ORAL_TABLET | Freq: Once | ORAL | Status: AC
  Administered 2018-07-20: 800 mg via ORAL
  Filled 2018-07-20: qty 1

## 2018-07-20 NOTE — ED Triage Notes (Signed)
Pt presents to ED via POV with c/o dental abscess to R gum. Pt states pain started at approx 0200 after eating.

## 2018-07-20 NOTE — ED Provider Notes (Signed)
[facility] EMERGENCY DEPARTMENT Provider Note   CSN: [id number] Arrival date & time: 07/20/18  0704     History   Chief Complaint Chief Complaint  Patient presents with  . Dental Pain    HPI Theresa Malone is a 37 y.o. female.  Presents to the emergency department for evaluation of right sided dental pain.  She points to the right lower third molar, states that she has had severe pain since 2 AM this morning that occurred right after eating.  She denies feeling a pop or crack in her tooth.  She denies any facial swelling, fevers.  Pain is severe.  She has not had medications for pain.  Patient points to the right lower third molar and states the tooth is very tender to palpation.  She denies any oral drainage, nausea or vomiting.  No headaches or fevers.  She has had recent dental infections to the same tooth and she is currently not on any antibiotics.  HPI  Past Medical History:  Diagnosis Date  . Anxiety    panic attacks  . Depression   . Diabetes mellitus   . Hypertension   . Obesity     Patient Active Problem List   Diagnosis Date Noted  . Yeast vaginitis 03/13/2018  . Type 2 diabetes mellitus, uncontrolled 02/07/2016  . Chronic hypertension 02/07/2016  . Menorrhagia with regular cycle 02/07/2016  . Frequent loose stools 02/07/2016  . Status post cesarean section 01/25/2015  . Hidradenitis axillaris 09/15/2014  . Susceptible to varicella (non-immune), currently pregnant 07/24/2014  . Morbid obesity with BMI of 45.0-49.9, adult (HCC) 03/03/2014    Past Surgical History:  Procedure Laterality Date  . CESAREAN SECTION     C/S x 2  . CESAREAN SECTION N/A 01/24/2015   Procedure: CESAREAN SECTION;  Surgeon: Catalina Antigua, MD;  Location: [facility];  Service: Obstetrics;  Laterality: N/A;  . TUBAL LIGATION       OB History    Gravida  4   Para  4   Term  2   Preterm  2   AB      Living  4     SAB      TAB      Ectopic      Multiple  0   Live Births  4            Home Medications    Prior to Admission medications   Medication Sig Start Date End Date Taking? Authorizing Provider  amoxicillin-clavulanate (AUGMENTIN) 875-125 MG tablet Take 1 tablet by mouth every 12 (twelve) hours for 7 days. 07/20/18 07/27/18  Evon Slack, PA-C  fluconazole (DIFLUCAN) 150 MG tablet Daily for 10 days 03/13/18   Cresenzo-Dishmon, Scarlette Calico, CNM  glyBURIDE (DIABETA) 5 MG tablet Take 10 mg by mouth daily.     [provider]  ibuprofen (ADVIL,MOTRIN) 800 MG tablet Take 1 tablet (800 mg total) by mouth every 8 (eight) hours as needed. 07/20/18   Evon Slack, PA-C  traMADol (ULTRAM) 50 MG tablet Take 1 tablet (50 mg total) by mouth every 6 (six) hours as needed. 07/20/18 07/20/19  Evon Slack, PA-C  triamterene-hydrochlorothiazide (MAXZIDE) 75-50 MG tablet Take 1 tablet by mouth daily.    [provider]  UNABLE TO FIND Anti-depressant-takes 2 daily    [provider]    Family History Family History  Problem Relation Age of Onset  . Diabetes Paternal Grandfather   . Cancer Paternal  Grandfather        liver  . Cancer Paternal Grandmother        liver  . Hypertension Father   . Diabetes Mother   . Hypertension Mother   . Diabetes Maternal Uncle   . Diabetes Maternal Grandmother   . Cancer Maternal Grandfather   . Diabetes Daughter        boarderline   . Asthma Daughter   . Bronchitis Daughter   . Bronchitis Son   . Bronchitis Daughter     Social History Social History   Tobacco Use  . Smoking status: Never Smoker  . Smokeless tobacco: Never Used  Substance Use Topics  . Alcohol use: Yes    Comment: wine occ  . Drug use: No     Allergies   Lisinopril   Review of Systems Review of Systems  Constitutional: Negative.  Negative for chills and fever.  HENT: Positive for dental problem. Negative for drooling, facial swelling, mouth sores, trouble swallowing and voice  change.   Respiratory: Negative for shortness of breath.   Cardiovascular: Negative for chest pain.  Gastrointestinal: Negative for nausea and vomiting.  Musculoskeletal: Negative for arthralgias, neck pain and neck stiffness.  Skin: Negative.   Psychiatric/Behavioral: Negative for confusion.  All other systems reviewed and are negative.    Physical Exam Updated Vital Signs BP (!) 149/97 (BP Location: Left Arm)   Pulse (!) 113   Temp 98.6 F (37 C) (Oral)   Resp 18   Ht 5\' 1"  (1.549 m)   Wt 104.8 kg   SpO2 96%   BMI 43.65 kg/m   Physical Exam  Constitutional: She is oriented to person, place, and time. She appears well-developed and well-nourished. No distress.  HENT:  Head: Normocephalic and atraumatic.  Right Ear: External ear normal.  Left Ear: External ear normal.  Nose: Nose normal.  Mouth/Throat: Uvula is midline, oropharynx is clear and moist and mucous membranes are normal. No oral lesions. No trismus in the jaw. Normal dentition. Dental abscesses and dental caries present. No uvula swelling. No oropharyngeal exudate, posterior oropharyngeal edema, posterior oropharyngeal erythema or tonsillar abscesses.    Eyes: EOM are normal.  Neck: Normal range of motion. Neck supple.  Cardiovascular: Normal rate. Exam reveals no gallop and no friction rub.  No murmur heard. Pulmonary/Chest: Effort normal and breath sounds normal. No respiratory distress.  Neurological: She is alert and oriented to person, place, and time.  Skin: Skin is warm and dry.  Psychiatric: She has a normal mood and affect. Her behavior is normal. Thought content normal.     ED Treatments / Results  Labs (all labs ordered are listed, but only abnormal results are displayed) Labs Reviewed - No data to display  EKG None  Radiology No results found.  Procedures .Nerve Block Date/Time: 07/20/2018 7:57 AM Performed by: Evon SlackGaines, Thomas C, PA-C Authorized by: Evon SlackGaines, Thomas C, PA-C   Consent:      Consent obtained:  Verbal   Consent given by:  Patient   Risks discussed:  Unsuccessful block   Alternatives discussed:  No treatment Indications:    Indications:  Pain relief Location:    Nerve block body site: Right mandibular nerve block.   Laterality:  Right Procedure details (see MAR for exact dosages):    Block needle gauge:  25 G   Anesthetic injected:  Bupivacaine 0.5% w/o epi and lidocaine 1% w/o epi   Steroid injected:  None   Injection procedure:  Anatomic landmarks  identified, anatomic landmarks palpated and introduced needle   Paresthesia:  None Post-procedure details:    Outcome:  Pain relieved   Patient tolerance of procedure:  Tolerated well, no immediate complications   (including critical care time)  Medications Ordered in ED Medications  lidocaine (PF) (XYLOCAINE) 1 % injection 5 mL (has no administration in time range)  bupivacaine (MARCAINE) 0.5 % (with pres) injection 5 mL (has no administration in time range)  ibuprofen (ADVIL,MOTRIN) tablet 800 mg (has no administration in time range)  amoxicillin (AMOXIL) capsule 1,000 mg (has no administration in time range)     Initial Impression / Assessment and Plan / ED Course  I have reviewed the triage vital signs and the nursing notes.  Pertinent labs & imaging results that were available during my care of the patient were reviewed by me and considered in my medical decision making (see chart for details).     37 year old female with right lower dental pain to the right third molar.  She has mild soft tissue swelling but no fluctuant abscess or drainage.  She agreed and consented to a right mandibular nerve block which she tolerated well and saw 100% relief of her dental pain.  She is given a prescription for Augmentin, tramadol and ibuprofen.  She will follow with dental clinic.  He understands signs symptoms return to the ED for.  Final Clinical Impressions(s) / ED Diagnoses   Final diagnoses:  Dental  abscess  Toothache    ED Discharge Orders         Ordered    amoxicillin-clavulanate (AUGMENTIN) 875-125 MG tablet  Every 12 hours     07/20/18 0737    traMADol (ULTRAM) 50 MG tablet  Every 6 hours PRN     07/20/18 0737    ibuprofen (ADVIL,MOTRIN) 800 MG tablet  Every 8 hours PRN     07/20/18 0737           Evon Slack, PA-C 07/20/18 0759    Myrna Blazer, MD 07/20/18 [phone]

## 2018-07-20 NOTE — ED Triage Notes (Signed)
Pt presents with right-sided dental pain since yesterday. She states it makes her face hurt and her head hurt. Pt alert & oriented with NAD noted.

## 2018-07-20 NOTE — ED Notes (Signed)
Pt presents today with right sided dental pain. Pt states it started this morning around 2 am when she was eating.

## 2018-07-20 NOTE — Discharge Instructions (Signed)
Please take ibuprofen as needed for mild pain.  He may use tramadol as needed for more moderate to severe pain.  Please take antibiotics, complete 7-day course.  If any increasing pain, swelling, fevers, difficulty swallowing please return to the emergency department.

## 2018-10-21 DIAGNOSIS — L0231 Cutaneous abscess of buttock: Secondary | ICD-10-CM | POA: Diagnosis not present

## 2019-04-11 ENCOUNTER — Other Ambulatory Visit: Payer: Self-pay

## 2019-04-11 ENCOUNTER — Emergency Department (HOSPITAL_COMMUNITY): Payer: Medicaid Other

## 2019-04-11 ENCOUNTER — Inpatient Hospital Stay (HOSPITAL_COMMUNITY)
Admission: EM | Admit: 2019-04-11 | Discharge: 2019-04-15 | DRG: 074 | Disposition: A | Discharge status: Home / Self Care | Payer: Medicaid Other | Attending: Internal Medicine | Admitting: Internal Medicine

## 2019-04-11 ENCOUNTER — Encounter (HOSPITAL_COMMUNITY): Payer: Self-pay

## 2019-04-11 DIAGNOSIS — R079 Chest pain, unspecified: Secondary | ICD-10-CM

## 2019-04-11 DIAGNOSIS — R1110 Vomiting, unspecified: Secondary | ICD-10-CM

## 2019-04-11 DIAGNOSIS — F329 Major depressive disorder, single episode, unspecified: Secondary | ICD-10-CM | POA: Diagnosis present

## 2019-04-11 DIAGNOSIS — Z833 Family history of diabetes mellitus: Secondary | ICD-10-CM

## 2019-04-11 DIAGNOSIS — E1043 Type 1 diabetes mellitus with diabetic autonomic (poly)neuropathy: Principal | ICD-10-CM | POA: Diagnosis present

## 2019-04-11 DIAGNOSIS — Z888 Allergy status to other drugs, medicaments and biological substances status: Secondary | ICD-10-CM

## 2019-04-11 DIAGNOSIS — R109 Unspecified abdominal pain: Secondary | ICD-10-CM | POA: Diagnosis present

## 2019-04-11 DIAGNOSIS — I152 Hypertension secondary to endocrine disorders: Secondary | ICD-10-CM | POA: Diagnosis present

## 2019-04-11 DIAGNOSIS — Z23 Encounter for immunization: Secondary | ICD-10-CM

## 2019-04-11 DIAGNOSIS — Z800 Family history of malignant neoplasm of digestive organs: Secondary | ICD-10-CM

## 2019-04-11 DIAGNOSIS — Z825 Family history of asthma and other chronic lower respiratory diseases: Secondary | ICD-10-CM

## 2019-04-11 DIAGNOSIS — I10 Essential (primary) hypertension: Secondary | ICD-10-CM | POA: Diagnosis present

## 2019-04-11 DIAGNOSIS — L03115 Cellulitis of right lower limb: Secondary | ICD-10-CM | POA: Diagnosis present

## 2019-04-11 DIAGNOSIS — L0390 Cellulitis, unspecified: Secondary | ICD-10-CM

## 2019-04-11 DIAGNOSIS — Z794 Long term (current) use of insulin: Secondary | ICD-10-CM

## 2019-04-11 DIAGNOSIS — Z20828 Contact with and (suspected) exposure to other viral communicable diseases: Secondary | ICD-10-CM | POA: Diagnosis present

## 2019-04-11 DIAGNOSIS — K3184 Gastroparesis: Secondary | ICD-10-CM | POA: Diagnosis present

## 2019-04-11 DIAGNOSIS — E119 Type 2 diabetes mellitus without complications: Secondary | ICD-10-CM

## 2019-04-11 DIAGNOSIS — Z809 Family history of malignant neoplasm, unspecified: Secondary | ICD-10-CM

## 2019-04-11 DIAGNOSIS — F410 Panic disorder [episodic paroxysmal anxiety] without agoraphobia: Secondary | ICD-10-CM

## 2019-04-11 DIAGNOSIS — E873 Alkalosis: Secondary | ICD-10-CM | POA: Diagnosis present

## 2019-04-11 DIAGNOSIS — R112 Nausea with vomiting, unspecified: Secondary | ICD-10-CM | POA: Diagnosis present

## 2019-04-11 DIAGNOSIS — E1065 Type 1 diabetes mellitus with hyperglycemia: Secondary | ICD-10-CM | POA: Diagnosis present

## 2019-04-11 DIAGNOSIS — E869 Volume depletion, unspecified: Secondary | ICD-10-CM | POA: Diagnosis present

## 2019-04-11 DIAGNOSIS — Z8249 Family history of ischemic heart disease and other diseases of the circulatory system: Secondary | ICD-10-CM

## 2019-04-11 DIAGNOSIS — Z6841 Body Mass Index (BMI) 40.0 and over, adult: Secondary | ICD-10-CM

## 2019-04-11 DIAGNOSIS — E871 Hypo-osmolality and hyponatremia: Secondary | ICD-10-CM | POA: Diagnosis present

## 2019-04-11 DIAGNOSIS — E876 Hypokalemia: Secondary | ICD-10-CM | POA: Diagnosis present

## 2019-04-11 LAB — BLOOD GAS, VENOUS
Acid-Base Excess: 1.9 mmol/L (ref 0.0–2.0)
Bicarbonate: 27.6 mmol/L (ref 20.0–28.0)
O2 Saturation: 72.1 %
Patient temperature: 37
pCO2, Ven: 23.5 mmHg — ABNORMAL LOW (ref 44.0–60.0)
pH, Ven: 7.607 (ref 7.250–7.430)
pO2, Ven: 35.5 mmHg (ref 32.0–45.0)

## 2019-04-11 LAB — CBC WITH DIFFERENTIAL/PLATELET
Abs Immature Granulocytes: 0.1 10*3/uL — ABNORMAL HIGH (ref 0.00–0.07)
Basophils Absolute: 0 10*3/uL (ref 0.0–0.1)
Basophils Relative: 0 %
Eosinophils Absolute: 0 10*3/uL (ref 0.0–0.5)
Eosinophils Relative: 0 %
HCT: 38.6 % (ref 36.0–46.0)
Hemoglobin: 13.2 g/dL (ref 12.0–15.0)
Immature Granulocytes: 1 %
Lymphocytes Relative: 24 %
Lymphs Abs: 2.8 10*3/uL (ref 0.7–4.0)
MCH: 26.9 pg (ref 26.0–34.0)
MCHC: 34.2 g/dL (ref 30.0–36.0)
MCV: 78.8 fL — ABNORMAL LOW (ref 80.0–100.0)
Monocytes Absolute: 1.1 10*3/uL — ABNORMAL HIGH (ref 0.1–1.0)
Monocytes Relative: 9 %
Neutro Abs: 7.6 10*3/uL (ref 1.7–7.7)
Neutrophils Relative %: 66 %
Platelets: 460 10*3/uL — ABNORMAL HIGH (ref 150–400)
RBC: 4.9 MIL/uL (ref 3.87–5.11)
RDW: 15.2 % (ref 11.5–15.5)
WBC: 11.7 10*3/uL — ABNORMAL HIGH (ref 4.0–10.5)
nRBC: 0 % (ref 0.0–0.2)

## 2019-04-11 LAB — COMPREHENSIVE METABOLIC PANEL
ALT: 33 U/L (ref 0–44)
AST: 14 U/L — ABNORMAL LOW (ref 15–41)
Albumin: 3.5 g/dL (ref 3.5–5.0)
Alkaline Phosphatase: 144 U/L — ABNORMAL HIGH (ref 38–126)
Anion gap: 18 — ABNORMAL HIGH (ref 5–15)
BUN: 18 mg/dL (ref 6–20)
CO2: 21 mmol/L — ABNORMAL LOW (ref 22–32)
Calcium: 9.6 mg/dL (ref 8.9–10.3)
Chloride: 93 mmol/L — ABNORMAL LOW (ref 98–111)
Creatinine, Ser: 0.88 mg/dL (ref 0.44–1.00)
GFR calc Af Amer: >60 mL/min (ref >60)
GFR calc non Af Amer: >60 mL/min (ref >60)
Glucose, Bld: 247 mg/dL — ABNORMAL HIGH (ref 70–99)
Potassium: 3 mmol/L — ABNORMAL LOW (ref 3.5–5.1)
Sodium: 132 mmol/L — ABNORMAL LOW (ref 135–145)
Total Bilirubin: 1.2 mg/dL (ref 0.3–1.2)
Total Protein: 8.1 g/dL (ref 6.5–8.1)

## 2019-04-11 LAB — CBG MONITORING, ED: Glucose-Capillary: 228 mg/dL — ABNORMAL HIGH (ref 70–99)

## 2019-04-11 LAB — MAGNESIUM: Magnesium: 1.9 mg/dL (ref 1.7–2.4)

## 2019-04-11 LAB — BETA-HYDROXYBUTYRIC ACID: Beta-Hydroxybutyric Acid: 1.74 mmol/L — ABNORMAL HIGH (ref 0.05–0.27)

## 2019-04-11 LAB — TROPONIN I: Troponin I: <0.03 ng/mL (ref <0.03)

## 2019-04-11 LAB — LACTIC ACID, PLASMA: Lactic Acid, Venous: 1.7 mmol/L (ref 0.5–1.9)

## 2019-04-11 LAB — PHOSPHORUS: Phosphorus: 2.6 mg/dL (ref 2.5–4.6)

## 2019-04-11 LAB — SARS CORONAVIRUS 2 BY RT PCR (HOSPITAL ORDER, PERFORMED IN ~~LOC~~ HOSPITAL LAB): SARS Coronavirus 2: NEGATIVE

## 2019-04-11 MED ORDER — POTASSIUM CHLORIDE 10 MEQ/100ML IV SOLN
10.0000 meq | Freq: Once | INTRAVENOUS | Status: AC
  Administered 2019-04-11: 10 meq via INTRAVENOUS
  Filled 2019-04-11: qty 100

## 2019-04-11 MED ORDER — ONDANSETRON HCL 4 MG PO TABS: 4.0000 mg | ORAL_TABLET | Freq: Four times a day (QID) | ORAL | Status: DC | PRN

## 2019-04-11 MED ORDER — INSULIN LISPRO 100 UNIT/ML ~~LOC~~ SOLN
5.00 | SUBCUTANEOUS | Status: DC
Start: 2019-04-11 — End: 2019-04-11

## 2019-04-11 MED ORDER — HYDROXYZINE HCL 25 MG PO TABS
25.00 | ORAL_TABLET | ORAL | Status: DC
Start: ? — End: 2019-04-11

## 2019-04-11 MED ORDER — INSULIN LISPRO 100 UNIT/ML ~~LOC~~ SOLN
0.00 | SUBCUTANEOUS | Status: DC
Start: 2019-04-11 — End: 2019-04-11

## 2019-04-11 MED ORDER — MAGNESIUM SULFATE 2 GM/50ML IV SOLN
2.0000 g | Freq: Once | INTRAVENOUS | Status: AC
  Administered 2019-04-11: 2 g via INTRAVENOUS
  Filled 2019-04-11: qty 50

## 2019-04-11 MED ORDER — GENERIC EXTERNAL MEDICATION
Status: DC
Start: ? — End: 2019-04-11

## 2019-04-11 MED ORDER — MELATONIN 3 MG PO TABS
3.00 | ORAL_TABLET | ORAL | Status: DC
Start: 2019-04-11 — End: 2019-04-11

## 2019-04-11 MED ORDER — KETOROLAC TROMETHAMINE 30 MG/ML IJ SOLN
15.0000 mg | Freq: Once | INTRAMUSCULAR | Status: AC
  Administered 2019-04-11: 15 mg via INTRAVENOUS
  Filled 2019-04-11: qty 1

## 2019-04-11 MED ORDER — CLINDAMYCIN PHOSPHATE IN D5W 600 MG/50ML IV SOLN
600.00 | INTRAVENOUS | Status: DC
Start: 2019-04-11 — End: 2019-04-11

## 2019-04-11 MED ORDER — ONDANSETRON HCL 4 MG/2ML IJ SOLN
4.0000 mg | Freq: Once | INTRAMUSCULAR | Status: AC
  Administered 2019-04-11: 4 mg via INTRAVENOUS
  Filled 2019-04-11: qty 2

## 2019-04-11 MED ORDER — FLUOXETINE HCL 20 MG PO CAPS
20.00 | ORAL_CAPSULE | ORAL | Status: DC
Start: 2019-04-12 — End: 2019-04-11

## 2019-04-11 MED ORDER — LORAZEPAM 2 MG/ML IJ SOLN
0.50 | INTRAMUSCULAR | Status: DC
Start: ? — End: 2019-04-11

## 2019-04-11 MED ORDER — LIDOCAINE VISCOUS HCL 2 % MT SOLN
15.0000 mL | Freq: Once | OROMUCOSAL | Status: AC
  Administered 2019-04-11: 15 mL via ORAL
  Filled 2019-04-11: qty 15

## 2019-04-11 MED ORDER — GENERIC EXTERNAL MEDICATION
12.50 | Status: DC
Start: ? — End: 2019-04-11

## 2019-04-11 MED ORDER — DOXYCYCLINE HYCLATE 100 MG PO TABS
100.00 | ORAL_TABLET | ORAL | Status: DC
Start: 2019-04-12 — End: 2019-04-11

## 2019-04-11 MED ORDER — TRIAMTERENE-HCTZ 37.5-25 MG PO TABS
2.00 | ORAL_TABLET | ORAL | Status: DC
Start: 2019-04-12 — End: 2019-04-11

## 2019-04-11 MED ORDER — ACETAMINOPHEN 325 MG PO TABS
650.0000 mg | ORAL_TABLET | Freq: Four times a day (QID) | ORAL | Status: DC | PRN
  Administered 2019-04-12 – 2019-04-13 (×2): 650 mg via ORAL
  Filled 2019-04-11 (×2): qty 2

## 2019-04-11 MED ORDER — SODIUM CHLORIDE 0.9 % IV SOLN
125.00 | INTRAVENOUS | Status: DC
Start: ? — End: 2019-04-11

## 2019-04-11 MED ORDER — ACETAMINOPHEN 500 MG PO TABS
1000.00 | ORAL_TABLET | ORAL | Status: DC
Start: 2019-04-11 — End: 2019-04-11

## 2019-04-11 MED ORDER — INSULIN GLARGINE 100 UNIT/ML ~~LOC~~ SOLN
45.00 | SUBCUTANEOUS | Status: DC
Start: 2019-04-11 — End: 2019-04-11

## 2019-04-11 MED ORDER — SIMETHICONE 80 MG PO CHEW
80.00 | CHEWABLE_TABLET | ORAL | Status: DC
Start: ? — End: 2019-04-11

## 2019-04-11 MED ORDER — POTASSIUM CHLORIDE CRYS ER 20 MEQ PO TBCR
40.0000 meq | EXTENDED_RELEASE_TABLET | Freq: Once | ORAL | Status: AC
  Administered 2019-04-11: 40 meq via ORAL
  Filled 2019-04-11: qty 2

## 2019-04-11 MED ORDER — POTASSIUM CHLORIDE IN NACL 20-0.9 MEQ/L-% IV SOLN
INTRAVENOUS | Status: DC
  Administered 2019-04-11: 23:00:00 via INTRAVENOUS
  Filled 2019-04-11: qty 1000

## 2019-04-11 MED ORDER — DEXTROSE 50 % IV SOLN
12.50 | INTRAVENOUS | Status: DC
Start: ? — End: 2019-04-11

## 2019-04-11 MED ORDER — PANTOPRAZOLE SODIUM 40 MG PO TBEC
40.00 | DELAYED_RELEASE_TABLET | ORAL | Status: DC
Start: 2019-04-12 — End: 2019-04-11

## 2019-04-11 MED ORDER — SODIUM CHLORIDE 0.9 % IV BOLUS
1000.0000 mL | Freq: Once | INTRAVENOUS | Status: AC
  Administered 2019-04-11: 1000 mL via INTRAVENOUS

## 2019-04-11 MED ORDER — ONDANSETRON 4 MG PO TBDP
4.00 | ORAL_TABLET | ORAL | Status: DC
Start: ? — End: 2019-04-11

## 2019-04-11 MED ORDER — ACETAMINOPHEN 650 MG RE SUPP: 650.0000 mg | Freq: Four times a day (QID) | RECTAL | Status: DC | PRN

## 2019-04-11 MED ORDER — POTASSIUM CHLORIDE IN NACL 40-0.9 MEQ/L-% IV SOLN
INTRAVENOUS | Status: DC
  Administered 2019-04-12 – 2019-04-15 (×8): 125 mL/h via INTRAVENOUS
  Filled 2019-04-11 (×5): qty 1000

## 2019-04-11 MED ORDER — ALUM & MAG HYDROXIDE-SIMETH 200-200-20 MG/5ML PO SUSP
30.0000 mL | Freq: Once | ORAL | Status: AC
  Administered 2019-04-11: 30 mL via ORAL
  Filled 2019-04-11: qty 30

## 2019-04-11 MED ORDER — ENOXAPARIN SODIUM 40 MG/0.4ML ~~LOC~~ SOLN
40.00 | SUBCUTANEOUS | Status: DC
Start: 2019-04-11 — End: 2019-04-11

## 2019-04-11 MED ORDER — MIDAZOLAM HCL 2 MG/2ML IJ SOLN
2.0000 mg | Freq: Once | INTRAMUSCULAR | Status: AC
  Administered 2019-04-11: 2 mg via INTRAVENOUS
  Filled 2019-04-11: qty 2

## 2019-04-11 MED ORDER — LOSARTAN POTASSIUM 50 MG PO TABS
50.00 | ORAL_TABLET | ORAL | Status: DC
Start: 2019-04-12 — End: 2019-04-11

## 2019-04-11 MED ORDER — ONDANSETRON HCL 4 MG/2ML IJ SOLN
4.0000 mg | Freq: Four times a day (QID) | INTRAMUSCULAR | Status: DC | PRN
  Administered 2019-04-12 – 2019-04-14 (×6): 4 mg via INTRAVENOUS
  Filled 2019-04-11 (×7): qty 2

## 2019-04-11 NOTE — ED Notes (Signed)
Patient transported to X-ray 

## 2019-04-11 NOTE — ED Notes (Signed)
Pt provided portable battery powered fan after reporting she felt hot.

## 2019-04-11 NOTE — ED Notes (Signed)
ED Provider at bedside. 

## 2019-04-11 NOTE — ED Triage Notes (Signed)
Pt reports recent antbx therapy for cellulitis of lower right leg. Pt left AMA from [facility] stating treatment was not helping. Pt presents today c/o anxiety off and on all day, with Nausea and dizziness. Pt reports unable to eat since Wednesday and reports crushing abdominal pain.

## 2019-04-11 NOTE — ED Provider Notes (Signed)
[facility] EMERGENCY DEPARTMENT Provider Note   CSN: [id number] Arrival date & time: 04/11/19  1859    History   Chief Complaint Chief Complaint  Patient presents with  . Anxiety    HPI Theresa Malone is a 38 y.o. female.     HPI 38 year old woman with history of type 1 diabetes, hypertension, morbid obesity comes in with chief complaint of nausea, vomiting, chest discomfort, shortness of breath and panic attack.  Patient reports that she was admitted at [facility] for almost a week for infection in her leg that was not responding to oral antibiotics.  Patient got scared yesterday and left AMA.  She reports that she did not pick up her clindamycin or nausea medication.  She started having diffuse abdominal pain yesterday with associated nausea and vomiting.  Patient has had more than 5 episodes of emesis today that is bilious in nature.  She has not had a bowel movement today.  Patient states that earlier in the day she started having chest heaviness and shortness of breath similar to her panic attacks, prompting her eventually to come to the ER.  Patient denies any UTI-like symptoms.  She has no history of kidney stones and she denies any vaginal bleeding or discharge. Past Medical History:  Diagnosis Date  . Anxiety    panic attacks  . Depression   . Diabetes mellitus   . Hypertension   . Obesity     Patient Active Problem List   Diagnosis Date Noted  . Intractable abdominal pain 04/11/2019  . Metabolic alkalosis 04/11/2019  . Respiratory alkalosis 04/11/2019  . Volume depletion 04/11/2019  . Hypokalemia 04/11/2019  . Hyponatremia 04/11/2019  . Yeast vaginitis 03/13/2018  . Type 2 diabetes mellitus, uncontrolled 02/07/2016  . Chronic hypertension 02/07/2016  . Menorrhagia with regular cycle 02/07/2016  . Frequent loose stools 02/07/2016  . Status post cesarean section 01/25/2015  . Hidradenitis axillaris 09/15/2014  . Susceptible to varicella (non-immune),  currently pregnant 07/24/2014  . Morbid obesity with BMI of 45.0-49.9, adult (HCC) 03/03/2014    Past Surgical History:  Procedure Laterality Date  . CESAREAN SECTION     C/S x 2  . CESAREAN SECTION N/A 01/24/2015   Procedure: CESAREAN SECTION;  Surgeon: Catalina Antigua, MD;  Location: [facility];  Service: Obstetrics;  Laterality: N/A;  . TUBAL LIGATION       OB History    Gravida  4   Para  4   Term  2   Preterm  2   AB      Living  4     SAB      TAB      Ectopic      Multiple  0   Live Births  4            Home Medications    Prior to Admission medications   Medication Sig Start Date End Date Taking? Authorizing Provider  fluconazole (DIFLUCAN) 150 MG tablet Daily for 10 days 03/13/18   Cresenzo-Dishmon, Scarlette Calico, CNM  glyBURIDE (DIABETA) 5 MG tablet Take 10 mg by mouth daily.     [provider]  ibuprofen (ADVIL,MOTRIN) 800 MG tablet Take 1 tablet (800 mg total) by mouth every 8 (eight) hours as needed. 07/20/18   Evon Slack, PA-C  traMADol (ULTRAM) 50 MG tablet Take 1 tablet (50 mg total) by mouth every 6 (six) hours as needed. 07/20/18 07/20/19  Evon Slack, PA-C  triamterene-hydrochlorothiazide (MAXZIDE) 75-50 MG  tablet Take 1 tablet by mouth daily.    [provider]  UNABLE TO FIND Anti-depressant-takes 2 daily    [provider]    Family History Family History  Problem Relation Age of Onset  . Diabetes Paternal Grandfather   . Cancer Paternal Grandfather        liver  . Cancer Paternal Grandmother        liver  . Hypertension Father   . Diabetes Mother   . Hypertension Mother   . Diabetes Maternal Uncle   . Diabetes Maternal Grandmother   . Cancer Maternal Grandfather   . Diabetes Daughter        boarderline   . Asthma Daughter   . Bronchitis Daughter   . Bronchitis Son   . Bronchitis Daughter     Social History Social History   Tobacco Use  . Smoking status: Never Smoker  . Smokeless tobacco:  Never Used  Substance Use Topics  . Alcohol use: Yes    Comment: wine occ  . Drug use: No     Allergies   Lisinopril   Review of Systems Review of Systems  Constitutional: Positive for activity change.  Gastrointestinal: Positive for abdominal pain, nausea and vomiting.  Genitourinary: Negative for dysuria and flank pain.  Hematological: Does not bruise/bleed easily.  All other systems reviewed and are negative.    Physical Exam Updated Vital Signs BP (!) 154/110   Pulse (!) 114   Temp 97.6 F (36.4 C) (Oral)   Resp (!) 21   Ht 5\' 1"  (1.549 m)   Wt 101.2 kg   LMP 04/11/2019 (Exact Date)   SpO2 100%   BMI 42.14 kg/m   Physical Exam Vitals signs and nursing note reviewed.  Constitutional:      General: She is in acute distress.     Appearance: She is well-developed.     Comments: Because of nausea and vomiting  HENT:     Head: Normocephalic and atraumatic.  Neck:     Musculoskeletal: Normal range of motion and neck supple.  Cardiovascular:     Rate and Rhythm: Tachycardia present.  Pulmonary:     Effort: No respiratory distress.     Comments: Tachypnea Abdominal:     General: Bowel sounds are normal.     Palpations: Abdomen is soft.     Tenderness: There is abdominal tenderness. There is no guarding or rebound.  Skin:    General: Skin is warm and dry.  Neurological:     Mental Status: She is alert and oriented to person, place, and time.      ED Treatments / Results  Labs (all labs ordered are listed, but only abnormal results are displayed) Labs Reviewed  COMPREHENSIVE METABOLIC PANEL - Abnormal; Notable for the following components:      Result Value   Sodium 132 (*)    Potassium 3.0 (*)    Chloride 93 (*)    CO2 21 (*)    Glucose, Bld 247 (*)    AST 14 (*)    Alkaline Phosphatase 144 (*)    Anion gap 18 (*)    All other components within normal limits  CBC WITH DIFFERENTIAL/PLATELET - Abnormal; Notable for the following components:   WBC  11.7 (*)    MCV 78.8 (*)    Platelets 460 (*)    Monocytes Absolute 1.1 (*)    Abs Immature Granulocytes 0.10 (*)    All other components within normal limits  BLOOD  GAS, VENOUS - Abnormal; Notable for the following components:   pH, Ven 7.607 (*)    pCO2, Ven 23.5 (*)    All other components within normal limits  CBG MONITORING, ED - Abnormal; Notable for the following components:   Glucose-Capillary 228 (*)    All other components within normal limits  SARS CORONAVIRUS 2 (HOSPITAL ORDER, PERFORMED IN [facility])  TROPONIN I  LACTIC ACID, PLASMA  BETA-HYDROXYBUTYRIC ACID  MAGNESIUM  PHOSPHORUS    EKG EKG Interpretation  Date/Time:  Saturday Apr 11 2019 19:35:38 EDT Ventricular Rate:  122 PR Interval:    QRS Duration: 79 QT Interval:  345 QTC Calculation: 492 R Axis:   36 Text Interpretation:  Sinus tachycardia Borderline prolonged QT interval No acute changes No significant change since last tracing Confirmed by Derwood Kaplan ([id number]) on 04/11/2019 7:58:04 PM   Radiology Dg Abd Acute 2+v W 1v Chest  Result Date: 04/11/2019 CLINICAL DATA:  Nausea and chest pain. Patient left against medical advice from [facility]. EXAM: DG ABDOMEN ACUTE W/ 1V CHEST COMPARISON:  Report from abdominopelvic CT yesterday at [facility], images not available FINDINGS: The cardiomediastinal contours are normal. The lungs are clear. There is no free intra-abdominal air. No dilated bowel loops to suggest obstruction. Enteric contrast throughout the colon from recent CT. Ovoid densities projecting over the left lower quadrant of uncertain significance but may be ingested pills. Small volume of stool throughout the colon. No radiopaque calculi. No acute osseous abnormalities are seen. IMPRESSION: 1. Normal bowel gas pattern. Enteric contrast throughout the colon from CT yesterday at an outside institution. No free air. 2. Clear lungs. Electronically Signed   By: Narda Rutherford M.D.   On:  04/11/2019 20:46    Procedures .Critical Care Performed by: Derwood Kaplan, MD Authorized by: Derwood Kaplan, MD   Critical care provider statement:    Critical care time (minutes):  45   Critical care was necessary to treat or prevent imminent or life-threatening deterioration of the following conditions:  Metabolic crisis   Critical care was time spent personally by me on the following activities:  Discussions with consultants, evaluation of patient's response to treatment, examination of patient, ordering and performing treatments and interventions, ordering and review of laboratory studies, ordering and review of radiographic studies, pulse oximetry, re-evaluation of patient's condition, obtaining history from patient or surrogate and review of old charts   (including critical care time)  Medications Ordered in ED Medications  potassium chloride 10 mEq in 100 mL IVPB (10 mEq Intravenous New Bag/Given 04/11/19 2104)  0.9 % NaCl with KCl 20 mEq/ L  infusion (has no administration in time range)  midazolam (VERSED) injection 2 mg (2 mg Intravenous Given 04/11/19 1945)  sodium chloride 0.9 % bolus 1,000 mL (0 mLs Intravenous Stopped 04/11/19 2101)  ondansetron (ZOFRAN) injection 4 mg (4 mg Intravenous Given 04/11/19 1945)  alum & mag hydroxide-simeth (MAALOX/MYLANTA) 200-200-20 MG/5ML suspension 30 mL (30 mLs Oral Given 04/11/19 1945)    And  lidocaine (XYLOCAINE) 2 % viscous mouth solution 15 mL (15 mLs Oral Given 04/11/19 1945)  potassium chloride SA (K-DUR) CR tablet 40 mEq (40 mEq Oral Given 04/11/19 2101)  ketorolac (TORADOL) 30 MG/ML injection 15 mg (15 mg Intravenous Given 04/11/19 2126)     Initial Impression / Assessment and Plan / ED Course  I have reviewed the triage vital signs and the nursing notes.  Pertinent labs & imaging results that were available during my care of the  patient were reviewed by me and considered in my medical decision making (see chart for details).   Clinical Course as of Apr 10 2129  Sat Apr 11, 2019  2127 Patient is noted to have mild anion gap at 18.  She has a potassium of 3, hyponatremia, hypo-chloremia.  Bicarb is also slightly decreased -meaning she actually has slight metabolic acidosis  Comprehensive metabolic panel(!) [AN]  2127 Patient's venous blood gas shows pH of 7.6.  Given that she has mild metabolic acidosis, her primary acid-base disorder is likely respiratory alkalosis with metabolic compensation.  Typically metabolic alkalosis would have shown higher bicarb -so it is not clear if patient is having contraction alkalosis at all.   [AN]  2129 Spoke with Dr. Robb Matar.  He will admit the patient.  Beta hydroxybutyrate acid added, in case patient's anion gap is worsening then she might need DKA treatment.  CO2(!): 21 [AN]    Clinical Course User Index [AN] Derwood Kaplan, MD       38 year old female with history of type 1 diabetes and anxiety comes in a chief complaint of abdominal pain, nausea, vomiting, chest discomfort.  She was recently admitted to [facility] for cellulitis.  She had an extended stay because of intractable nausea and vomiting.  She informs me that she left AMA, but has not filled up her antibiotics and has not taken any nausea medication.  Review from care everywhere indicates that indeed patient was admitted last week for cellulitis and she was having intractable nausea and vomiting, which led to prolonged stay.  She had a CT scan done yesterday which did not have any acute GI findings.  On my clinical correlation there is no evidence of gluteal cellulitis.  Her abdominal exam is diffuse, CT scan ordered to ensure there is no evidence of ileus/sbo and it is negative.  Abdominal exam is not peritoneal.  Patient is having chest heaviness and shortness of breath.  She states that she has history of anxiety.  There is no history of PE, DVT and no risk factors for the same besides a recent admission, during which she  was getting Lovenox.  EKG shows tachycardia.  Troponin ordered.  Suspect anxiety, and she will get Versed.  Patient is type I diabetic, venous blood gas ordered to further evaluate for DKA.  Final Clinical Impressions(s) / ED Diagnoses   Final diagnoses:  Panic attack  Acute respiratory alkalosis  Intractable vomiting with nausea, unspecified vomiting type  Cellulitis, unspecified cellulitis site    ED Discharge Orders    None       Derwood Kaplan, MD 04/11/19 2130

## 2019-04-11 NOTE — ED Notes (Signed)
Pt provided sips of water with medications, but still reporting nausea.

## 2019-04-11 NOTE — Progress Notes (Signed)
RN paged C. Bodenheimer, NP per patient's request to see if patient can have anything for sleep, awaiting response.  P.J. Henderson Newcomer, RN

## 2019-04-11 NOTE — H&P (Signed)
History and Physical    Theresa Malone POI:[id number] DOB: 10-Jan-1981 DOA: 04/11/2019  PCP: Jannet Askew, MD   Patient coming from: Home.  I have personally briefly reviewed patient's old medical records in [facility]  Chief Complaint: Abdominal pain, nausea and vomiting.  HPI: Theresa Malone is a 38 y.o. female with medical history significant of anxiety, depression, type 2 diabetes, hypertension, morbid obesity with a BMI of 42 who was coming to the emergency department with complaints of anxiety.  Patient states that she was admitted to [facility] for almost a week due to right lower extremity cellulitis that did not respond to oral antibiotics.  She states that she received 5 days of IV antibiotics at that facility, but ended up signing AMA due to anxiety and feeling that the treatment was not helping.  She has been having palpitations and hyperventilating.  She has been vomiting several times a day and complains of moderate to severe epigastric pain.  She denies diarrhea, melena or hematochezia.  She has not had a bowel movement since Wednesday.  She had a CT scan done yesterday at the outside facility, which was normal.  She denies fever, chills, sore throat, rhinorrhea, cough, wheezing, hemoptysis, chest pain, diaphoresis, PND, orthopnea or pitting edema of the lower extremities.  She denies polyuria, polydipsia, polyphagia or blurred vision.  ED Malone: The patient received GI cocktail, Versed 2 mg IVP x1, Zofran 4 mg IVP x1, KCl 40 mEq p.o. x1, KCl 10 mEq IVPB x1 and 1000 mL of normal saline bolus.  I order another 1000 mL of NS bolus as well.  She received Phenergan 12.5 mg IVP x1 dose upon arrival to the floor.  White count was 11.7, hemoglobin 13.2 g/dL and platelets 312.  CMP shows sodium 132, potassium 3.0, chloride 93 and CO2 21 mmol/L.  Anion gap was 18.  Renal function was normal.  AST is slightly decreased at 14 and alkaline phosphatase slightly increased at 144  units/L.  The rest of the hepatic functions are normal.  Venous blood gas showed a pH of 7.607 and a PCO2 of 23.5 mmHg.  All other values are within normal limits.  Initial lactic acid was 1.7 mmol/L.  Troponin was less than 0.03 ng/mL.  Review of Systems: As per HPI otherwise 10 point review of systems negative.   Past Medical History:  Diagnosis Date  . Anxiety    panic attacks  . Depression   . Diabetes mellitus   . Hypertension   . Obesity     Past Surgical History:  Procedure Laterality Date  . CESAREAN SECTION     C/S x 2  . CESAREAN SECTION N/A 01/24/2015   Procedure: CESAREAN SECTION;  Surgeon: Catalina Antigua, MD;  Location: [facility];  Service: Obstetrics;  Laterality: N/A;  . TUBAL LIGATION       reports that she has never smoked. She has never used smokeless tobacco. She reports current alcohol use. She reports that she does not use drugs.  Allergies  Allergen Reactions  . Lisinopril Cough    cough    Family History  Problem Relation Age of Onset  . Diabetes Paternal Grandfather   . Cancer Paternal Grandfather        liver  . Cancer Paternal Grandmother        liver  . Hypertension Father   . Diabetes Mother   . Hypertension Mother   . Diabetes Maternal Uncle   . Diabetes Maternal  Grandmother   . Cancer Maternal Grandfather   . Diabetes Daughter        boarderline   . Asthma Daughter   . Bronchitis Daughter   . Bronchitis Son   . Bronchitis Daughter    Prior to Admission medications   Medication Sig Start Date End Date Taking? Authorizing Provider  acetaminophen (TYLENOL) 500 MG tablet Take 2 tablets by mouth every 6 (six) hours as needed for pain.   Yes [provider]  clindamycin (CLEOCIN) 300 MG capsule Take 1 capsule by mouth 4 (four) times daily. For 10 days 03/30/19  Yes [provider]  doxycycline (VIBRA-TABS) 100 MG tablet Take 1 tablet by mouth daily.   Yes [provider]  FLUoxetine (PROZAC) 20 MG capsule Take 1  capsule by mouth daily.   Yes [provider]  insulin glargine (LANTUS) 100 UNIT/ML injection Inject 45 Units into the skin 2 (two) times daily. 04/11/19 05/11/19 Yes [provider]  losartan (COZAAR) 50 MG tablet Take 1 tablet by mouth daily. 04/11/19 05/11/19 Yes [provider]  Melatonin 3 MG TABS Take 1 tablet by mouth at bedtime as needed. 04/11/19  Yes [provider]  metFORMIN (GLUMETZA) 1000 MG (MOD) 24 hr tablet Take 1 tablet by mouth 2 (two) times daily.   Yes [provider]  pantoprazole (PROTONIX) 40 MG tablet Take 1 tablet by mouth daily. 04/11/19 05/11/19 Yes [provider]  traMADol (ULTRAM) 50 MG tablet Take 1 tablet (50 mg total) by mouth every 6 (six) hours as needed. 07/20/18 07/20/19 Yes Evon SlackGaines, Thomas C, PA-C  triamterene-hydrochlorothiazide (MAXZIDE) 75-50 MG tablet Take 1 tablet by mouth daily.   Yes [provider]    Physical Exam: Vitals:   04/11/19 2200 04/11/19 2230 04/11/19 2245 04/11/19 2331  BP: (!) 142/88 (!) 150/104  (!) 158/106  Pulse: 90 87 99 90  Resp: (!) 0 (!) 23 19   Temp:    97.6 F (36.4 C)  TempSrc:    Oral  SpO2: 100% 100% 100% 97%  Weight:      Height:        Constitutional: NAD, calm, comfortable Eyes: PERRL, lids and conjunctivae normal ENMT: Mucous membranes are moist. Posterior pharynx clear of any exudate or lesions. Neck: normal, supple, no masses, no thyromegaly Respiratory: Decreased breath sounds on bases, otherwise clear to auscultation bilaterally, no wheezing, no crackles. Normal respiratory effort. No accessory muscle use.  Cardiovascular: Regular rate and rhythm, no murmurs / rubs / gallops. No extremity edema. 2+ pedal pulses. No carotid bruits.  Abdomen: Obese, soft, positive for gastric tenderness, no guarding or rebound, no masses palpated. No hepatosplenomegaly. Bowel sounds positive.  Musculoskeletal: no clubbing / cyanosis. Good ROM, no contractures. Normal  muscle tone.  Skin: Healed cellulitis on right pretibial area.  Please see image below. Neurologic: CN 2-12 grossly intact. Sensation intact, DTR normal. Strength 5/5 in all 4.  Psychiatric: Normal judgment and insight. Alert and oriented x 3. Normal mood.     Labs on Admission: I have personally reviewed following labs and imaging studies  CBC: Recent Labs  Lab 04/11/19 1938  WBC 11.7*  NEUTROABS 7.6  HGB 13.2  HCT 38.6  MCV 78.8*  PLT 460*   Basic Metabolic Panel: Recent Labs  Lab 04/11/19 1938  NA 132*  K 3.0*  CL 93*  CO2 21*  GLUCOSE 247*  BUN 18  CREATININE 0.88  CALCIUM 9.6  MG 1.9  PHOS 2.6   GFR:  Estimated Creatinine Clearance: 97.5 mL/min (by C-G formula based on SCr of 0.88 mg/dL). Liver Function Tests: Recent Labs  Lab 04/11/19 1938  AST 14*  ALT 33  ALKPHOS 144*  BILITOT 1.2  PROT 8.1  ALBUMIN 3.5   No results for input(s): LIPASE, AMYLASE in the last 168 hours. No results for input(s): AMMONIA in the last 168 hours. Coagulation Profile: No results for input(s): INR, PROTIME in the last 168 hours. Cardiac Enzymes: Recent Labs  Lab 04/11/19 1938  TROPONINI <0.03   BNP (last 3 results) No results for input(s): PROBNP in the last 8760 hours. HbA1C: No results for input(s): HGBA1C in the last 72 hours. CBG: Recent Labs  Lab 04/11/19 1933  GLUCAP 228*   Lipid Profile: No results for input(s): CHOL, HDL, LDLCALC, TRIG, CHOLHDL, LDLDIRECT in the last 72 hours. Thyroid Function Tests: No results for input(s): TSH, T4TOTAL, FREET4, T3FREE, THYROIDAB in the last 72 hours. Anemia Panel: No results for input(s): VITAMINB12, FOLATE, FERRITIN, TIBC, IRON, RETICCTPCT in the last 72 hours. Urine analysis:    Component Value Date/Time   COLORURINE YELLOW 02/05/2015 2125   APPEARANCEUR CLEAR 02/05/2015 2125   LABSPEC <1.005 (L) 02/05/2015 2125   PHURINE 6.5 02/05/2015 2125   GLUCOSEU NEGATIVE 02/05/2015 2125   HGBUR LARGE (A) 02/05/2015  2125   BILIRUBINUR NEGATIVE 02/05/2015 2125   KETONESUR NEGATIVE 02/05/2015 2125   PROTEINUR NEGATIVE 02/05/2015 2125   UROBILINOGEN 0.2 02/05/2015 2125   NITRITE NEGATIVE 02/05/2015 2125   LEUKOCYTESUR LARGE (A) 02/05/2015 2125    Radiological Exams on Admission: Dg Abd Acute 2+v W 1v Chest  Result Date: 04/11/2019 CLINICAL DATA:  Nausea and chest pain. Patient left against medical advice from [facility]. EXAM: DG ABDOMEN ACUTE W/ 1V CHEST COMPARISON:  Report from abdominopelvic CT yesterday at [facility], images not available FINDINGS: The cardiomediastinal contours are normal. The lungs are clear. There is no free intra-abdominal air. No dilated bowel loops to suggest obstruction. Enteric contrast throughout the colon from recent CT. Ovoid densities projecting over the left lower quadrant of uncertain significance but may be ingested pills. Small volume of stool throughout the colon. No radiopaque calculi. No acute osseous abnormalities are seen. IMPRESSION: 1. Normal bowel gas pattern. Enteric contrast throughout the colon from CT yesterday at an outside institution. No free air. 2. Clear lungs. Electronically Signed   By: Narda Rutherford M.D.   On: 04/11/2019 20:46    EKG: Independently reviewed.  Vent. rate 122 BPM PR interval * ms QRS duration 79 ms QT/QTc 345/492 ms P-R-T axes 60 36 24 Sinus tachycardia Borderline prolonged QT interval  Assessment/Plan Principal Problem:   Intractable abdominal pain With persistent nausea and emesis. Observation/telemetry. Keep n.p.o. Continue IV fluids and electrolyte replacement. Analgesics as needed. Antiemetics as needed. Switch Protonix to IV form for now.  Active Problems:   Metabolic alkalosis Secondary to volume contraction and vomiting. Was taking her Maxide earlier this week. Continue IV fluids.    Respiratory alkalosis Secondary to hyperventilation. Respiratory rate is normal now. Follow-up venous gas as needed.     Volume depletion Continue IV fluids.    Hypokalemia Secondary to GI losses and alkalosis. Likely reason for borderline prolonged QT interval. Currently replacing. Magnesium was supplemented as well.    Hyponatremia Secondary to GI losses. Continue normal saline infusion. Follow-up sodium level.    Type 2 diabetes mellitus, uncontrolled Currently n.p.o. Hyperglycemia has improved with NS. Continue IV fluids for now. Hold insulin since the patient has not  eaten much recently.    Chronic hypertension Medication intake has been a challenge due to emesis. For the moment, will use IV metoprolol as needed. Monitor blood pressure.    Cellulitis of right lower extremity The patient states that she received at least 5 days of antibiotics. Cellulitis seems to be healing and not actively infected. Monitor clinically.   DVT prophylaxis: SCDs. Code Status: Full code. Family Communication: Disposition Plan: Observation for IV hydration, electrolyte replacement and symptoms treatment. Consults called: Admission status: Observation/telemetry.   Bobette Mo MD [facility]  04/11/2019, 11:37 PM   This document was prepared using Dragon voice recognition software and may contain some unintended transcription errors.

## 2019-04-11 NOTE — ED Notes (Signed)
Date and time results received: 04/11/19 8:05 PM  (use smartphrase ".now" to insert current time)  Test: ph Critical Value: 7.607  Name of Provider Notified: Dr Rhunette Croft  Orders Received? Or Actions Taken?: none

## 2019-04-12 DIAGNOSIS — L03115 Cellulitis of right lower limb: Secondary | ICD-10-CM

## 2019-04-12 LAB — GLUCOSE, CAPILLARY
Glucose-Capillary: 199 mg/dL — ABNORMAL HIGH (ref 70–99)
Glucose-Capillary: 145 mg/dL — ABNORMAL HIGH (ref 70–99)
Glucose-Capillary: 111 mg/dL — ABNORMAL HIGH (ref 70–99)
Glucose-Capillary: 96 mg/dL (ref 70–99)
Glucose-Capillary: 135 mg/dL — ABNORMAL HIGH (ref 70–99)

## 2019-04-12 LAB — BASIC METABOLIC PANEL
Anion gap: 11 (ref 5–15)
BUN: 17 mg/dL (ref 6–20)
CO2: 24 mmol/L (ref 22–32)
Calcium: 8.3 mg/dL — ABNORMAL LOW (ref 8.9–10.3)
Chloride: 98 mmol/L (ref 98–111)
Creatinine, Ser: 0.86 mg/dL (ref 0.44–1.00)
GFR calc Af Amer: >60 mL/min (ref >60)
GFR calc non Af Amer: >60 mL/min (ref >60)
Glucose, Bld: 184 mg/dL — ABNORMAL HIGH (ref 70–99)
Potassium: 3.2 mmol/L — ABNORMAL LOW (ref 3.5–5.1)
Sodium: 133 mmol/L — ABNORMAL LOW (ref 135–145)

## 2019-04-12 LAB — BLOOD GAS, VENOUS
Acid-Base Excess: 2.1 mmol/L — ABNORMAL HIGH (ref 0.0–2.0)
Bicarbonate: 25.4 mmol/L (ref 20.0–28.0)
FIO2: 21
O2 Saturation: 58.1 %
Patient temperature: 37
pCO2, Ven: 40.3 mmHg — ABNORMAL LOW (ref 44.0–60.0)
pH, Ven: 7.426 (ref 7.250–7.430)
pO2, Ven: 37.4 mmHg (ref 32.0–45.0)

## 2019-04-12 MED ORDER — POTASSIUM CHLORIDE CRYS ER 20 MEQ PO TBCR
40.0000 meq | EXTENDED_RELEASE_TABLET | ORAL | Status: AC
  Administered 2019-04-12 (×2): 40 meq via ORAL
  Filled 2019-04-12 (×2): qty 2

## 2019-04-12 MED ORDER — BUSPIRONE HCL 5 MG PO TABS
10.0000 mg | ORAL_TABLET | Freq: Once | ORAL | Status: AC
  Administered 2019-04-12: 10 mg via ORAL
  Filled 2019-04-12: qty 2

## 2019-04-12 MED ORDER — PANTOPRAZOLE SODIUM 40 MG IV SOLR
40.0000 mg | INTRAVENOUS | Status: DC
  Administered 2019-04-12 – 2019-04-15 (×4): 40 mg via INTRAVENOUS
  Filled 2019-04-12 (×4): qty 40

## 2019-04-12 MED ORDER — PROMETHAZINE HCL 25 MG/ML IJ SOLN
25.0000 mg | INTRAMUSCULAR | Status: DC | PRN
  Administered 2019-04-12 – 2019-04-14 (×5): 25 mg via INTRAVENOUS
  Filled 2019-04-12 (×5): qty 1

## 2019-04-12 MED ORDER — INSULIN ASPART 100 UNIT/ML ~~LOC~~ SOLN: 0.0000 [IU] | Freq: Every day | SUBCUTANEOUS | Status: DC

## 2019-04-12 MED ORDER — INSULIN GLARGINE 100 UNIT/ML ~~LOC~~ SOLN: 45.0000 [IU] | Freq: Two times a day (BID) | SUBCUTANEOUS | Status: DC

## 2019-04-12 MED ORDER — PNEUMOCOCCAL VAC POLYVALENT 25 MCG/0.5ML IJ INJ
0.5000 mL | INJECTION | INTRAMUSCULAR | Status: AC
  Administered 2019-04-13: 0.5 mL via INTRAMUSCULAR
  Filled 2019-04-12: qty 0.5

## 2019-04-12 MED ORDER — METOPROLOL TARTRATE 5 MG/5ML IV SOLN: 5.0000 mg | Freq: Once | INTRAVENOUS | Status: DC

## 2019-04-12 MED ORDER — METOPROLOL TARTRATE 5 MG/5ML IV SOLN
5.0000 mg | INTRAVENOUS | Status: DC | PRN
  Administered 2019-04-12 – 2019-04-14 (×5): 5 mg via INTRAVENOUS
  Filled 2019-04-12 (×5): qty 5

## 2019-04-12 MED ORDER — INSULIN ASPART 100 UNIT/ML ~~LOC~~ SOLN
0.0000 [IU] | Freq: Three times a day (TID) | SUBCUTANEOUS | Status: DC
  Administered 2019-04-12 – 2019-04-13 (×2): 2 [IU] via SUBCUTANEOUS
  Administered 2019-04-14: 3 [IU] via SUBCUTANEOUS
  Administered 2019-04-14: 0 [IU] via SUBCUTANEOUS
  Administered 2019-04-15: 5 [IU] via SUBCUTANEOUS
  Administered 2019-04-15: 3 [IU] via SUBCUTANEOUS

## 2019-04-12 MED ORDER — LORAZEPAM 2 MG/ML IJ SOLN
1.0000 mg | Freq: Once | INTRAMUSCULAR | Status: AC
  Administered 2019-04-12: 1 mg via INTRAVENOUS
  Filled 2019-04-12: qty 1

## 2019-04-12 MED ORDER — ALPRAZOLAM 0.25 MG PO TABS
0.2500 mg | ORAL_TABLET | Freq: Three times a day (TID) | ORAL | Status: DC | PRN
  Administered 2019-04-12 – 2019-04-13 (×2): 0.25 mg via ORAL
  Filled 2019-04-12 (×2): qty 1

## 2019-04-12 MED ORDER — SODIUM CHLORIDE 0.9 % IV BOLUS
1000.0000 mL | Freq: Once | INTRAVENOUS | Status: AC
  Administered 2019-04-12: 1000 mL via INTRAVENOUS

## 2019-04-12 MED ORDER — INSULIN GLARGINE 100 UNIT/ML ~~LOC~~ SOLN
20.0000 [IU] | Freq: Two times a day (BID) | SUBCUTANEOUS | Status: DC
  Administered 2019-04-12 – 2019-04-15 (×7): 20 [IU] via SUBCUTANEOUS
  Filled 2019-04-12 (×9): qty 0.2

## 2019-04-12 MED ORDER — PROMETHAZINE HCL 25 MG/ML IJ SOLN
12.5000 mg | Freq: Once | INTRAMUSCULAR | Status: AC
  Administered 2019-04-12: 12.5 mg via INTRAVENOUS
  Filled 2019-04-12: qty 1

## 2019-04-12 NOTE — Progress Notes (Signed)
[facility]  I have evaluated the patient who was admitted by my colleague this AM.   Theresa Malone is a 38 y.o. female with medical history significant of anxiety, depression, type 2 diabetes, hypertension, morbid obesity with a BMI of 42 who was coming to the emergency department with complaints of anxiety which has contributed to vomiting and abdominal pain.   Principal Problem:   Intractable abdominal pain, vomiting - appear to be stemming from anxiety- she is feeling a bit better today- will start with a full liquids diet  Active Problems: Severe anxiety - she feels that doubling her Prozac to 20 mg has made her anxiety quite severe - today, will hold Prozac to allow her blood level to come down- when it is resumed, it should be resumed at 10 mg - will start Buspar for her severe anxiety which is causing tachycardia, nausea and vomiting  Dehydration, metabolic and respiratory alkalosis - cont IVF  Hypokalemia - continue to replace    Type 2 diabetes mellitus, uncontrolled - cont Lantus and SSI    Chronic hypertension - hold Cozaar, Triamterene and Maxzide while hydrating     Cellulitis of right lower extremity - has resolved  Calvert Cantor, MD

## 2019-04-12 NOTE — Progress Notes (Signed)
RN paged Dr. Robb Matar to make him aware patient states she is very anxious.  Patient states she takes Prozac at home and dose recently increased from 10 mg to 20 mg and since then she has become very anxious.  RN waiting for response from MD.  [facility] Newcomer, RN

## 2019-04-13 DIAGNOSIS — Z825 Family history of asthma and other chronic lower respiratory diseases: Secondary | ICD-10-CM | POA: Diagnosis not present

## 2019-04-13 DIAGNOSIS — Z23 Encounter for immunization: Secondary | ICD-10-CM | POA: Diagnosis not present

## 2019-04-13 DIAGNOSIS — Z888 Allergy status to other drugs, medicaments and biological substances status: Secondary | ICD-10-CM | POA: Diagnosis not present

## 2019-04-13 DIAGNOSIS — Z6841 Body Mass Index (BMI) 40.0 and over, adult: Secondary | ICD-10-CM | POA: Diagnosis not present

## 2019-04-13 DIAGNOSIS — I10 Essential (primary) hypertension: Secondary | ICD-10-CM | POA: Diagnosis present

## 2019-04-13 DIAGNOSIS — Z8249 Family history of ischemic heart disease and other diseases of the circulatory system: Secondary | ICD-10-CM | POA: Diagnosis not present

## 2019-04-13 DIAGNOSIS — L03115 Cellulitis of right lower limb: Secondary | ICD-10-CM | POA: Diagnosis present

## 2019-04-13 DIAGNOSIS — E869 Volume depletion, unspecified: Secondary | ICD-10-CM

## 2019-04-13 DIAGNOSIS — F329 Major depressive disorder, single episode, unspecified: Secondary | ICD-10-CM | POA: Diagnosis present

## 2019-04-13 DIAGNOSIS — E873 Alkalosis: Secondary | ICD-10-CM

## 2019-04-13 DIAGNOSIS — Z809 Family history of malignant neoplasm, unspecified: Secondary | ICD-10-CM | POA: Diagnosis not present

## 2019-04-13 DIAGNOSIS — E1043 Type 1 diabetes mellitus with diabetic autonomic (poly)neuropathy: Secondary | ICD-10-CM | POA: Diagnosis present

## 2019-04-13 DIAGNOSIS — R112 Nausea with vomiting, unspecified: Secondary | ICD-10-CM | POA: Diagnosis present

## 2019-04-13 DIAGNOSIS — R079 Chest pain, unspecified: Secondary | ICD-10-CM

## 2019-04-13 DIAGNOSIS — E1065 Type 1 diabetes mellitus with hyperglycemia: Secondary | ICD-10-CM | POA: Diagnosis present

## 2019-04-13 DIAGNOSIS — E871 Hypo-osmolality and hyponatremia: Secondary | ICD-10-CM | POA: Diagnosis present

## 2019-04-13 DIAGNOSIS — K3184 Gastroparesis: Secondary | ICD-10-CM | POA: Diagnosis present

## 2019-04-13 DIAGNOSIS — Z800 Family history of malignant neoplasm of digestive organs: Secondary | ICD-10-CM | POA: Diagnosis not present

## 2019-04-13 DIAGNOSIS — Z833 Family history of diabetes mellitus: Secondary | ICD-10-CM | POA: Diagnosis not present

## 2019-04-13 DIAGNOSIS — E876 Hypokalemia: Secondary | ICD-10-CM | POA: Diagnosis present

## 2019-04-13 DIAGNOSIS — F410 Panic disorder [episodic paroxysmal anxiety] without agoraphobia: Secondary | ICD-10-CM | POA: Diagnosis present

## 2019-04-13 DIAGNOSIS — Z794 Long term (current) use of insulin: Secondary | ICD-10-CM | POA: Diagnosis not present

## 2019-04-13 DIAGNOSIS — Z20828 Contact with and (suspected) exposure to other viral communicable diseases: Secondary | ICD-10-CM | POA: Diagnosis present

## 2019-04-13 DIAGNOSIS — R1110 Vomiting, unspecified: Secondary | ICD-10-CM

## 2019-04-13 LAB — BASIC METABOLIC PANEL
Anion gap: 8 (ref 5–15)
BUN: 9 mg/dL (ref 6–20)
CO2: 23 mmol/L (ref 22–32)
Calcium: 8.5 mg/dL — ABNORMAL LOW (ref 8.9–10.3)
Chloride: 104 mmol/L (ref 98–111)
Creatinine, Ser: 0.74 mg/dL (ref 0.44–1.00)
GFR calc Af Amer: >60 mL/min (ref >60)
GFR calc non Af Amer: >60 mL/min (ref >60)
Glucose, Bld: 159 mg/dL — ABNORMAL HIGH (ref 70–99)
Potassium: 4.1 mmol/L (ref 3.5–5.1)
Sodium: 135 mmol/L (ref 135–145)

## 2019-04-13 LAB — HIV ANTIBODY (ROUTINE TESTING W REFLEX): HIV Screen 4th Generation wRfx: NONREACTIVE

## 2019-04-13 LAB — GLUCOSE, CAPILLARY
Glucose-Capillary: 145 mg/dL — ABNORMAL HIGH (ref 70–99)
Glucose-Capillary: 116 mg/dL — ABNORMAL HIGH (ref 70–99)
Glucose-Capillary: 156 mg/dL — ABNORMAL HIGH (ref 70–99)
Glucose-Capillary: 170 mg/dL — ABNORMAL HIGH (ref 70–99)

## 2019-04-13 MED ORDER — LIDOCAINE VISCOUS HCL 2 % MT SOLN
15.0000 mL | Freq: Once | OROMUCOSAL | Status: AC
  Administered 2019-04-13: 15 mL via ORAL
  Filled 2019-04-13: qty 15

## 2019-04-13 MED ORDER — LIDOCAINE VISCOUS HCL 2 % MT SOLN: 15.0000 mL | Freq: Once | OROMUCOSAL | Status: DC

## 2019-04-13 MED ORDER — ALUM & MAG HYDROXIDE-SIMETH 200-200-20 MG/5ML PO SUSP: 30.0000 mL | Freq: Once | ORAL | Status: DC

## 2019-04-13 MED ORDER — LIDOCAINE VISCOUS HCL 2 % MT SOLN
15.0000 mL | Freq: Four times a day (QID) | OROMUCOSAL | Status: DC | PRN
  Administered 2019-04-13: 15 mL via ORAL
  Filled 2019-04-13: qty 15

## 2019-04-13 MED ORDER — ALUM & MAG HYDROXIDE-SIMETH 200-200-20 MG/5ML PO SUSP: 30.0000 mL | Freq: Once | ORAL | Status: AC

## 2019-04-13 MED ORDER — ALUM & MAG HYDROXIDE-SIMETH 200-200-20 MG/5ML PO SUSP
30.0000 mL | Freq: Four times a day (QID) | ORAL | Status: DC | PRN
  Administered 2019-04-13 (×2): 30 mL via ORAL
  Filled 2019-04-13 (×2): qty 30

## 2019-04-13 NOTE — Progress Notes (Signed)
PROGRESS NOTE    Theresa Malone   BXI:[id number]  DOB: [birth year]  DOA: 04/11/2019 PCP: Jannet Askew, MD   Brief Narrative:  Theresa Malone  is a 38 y.o.femalewith medical history significant ofanxiety, depression, type 2 diabetes, hypertension, morbid obesitywith a BMI of 42 who was coming to the emergency department with complaints of anxiety which has contributed to vomiting and abdominal pain.     Subjective: Continues to be severely nauseated. Is having chest pain and dry heaving this AM. Anxiety is a little better controlled today.     Assessment & Plan:   Principal Problem:   Intractable nausea, vomiting and abdominal pain- chest pain - suspect to due to anxiety- SSRI's can also cause nausea and vomiting and the higher dose of Prozac she was on may be contributing - she is still having retching today- will continue IVF until I see that she is tolerating food and drink - will start a GI cocktail PRN for the chest pain which is likely related to esophagitis from her excessive vomiting - cont on IV PPI today  Active Problems:    Severe anxiety - she feels that doubling her Prozac to 20 mg has made her anxiety quite severe -  will continue to hold Prozac to allow her blood level to come down- when it is resumed, it should be resumed at 10 mg - Have started PRN Xanax to control her anxiety symptoms  Dehydration, metabolic and respiratory alkalosis - cont IVF  Hypokalemia - continue to replace    Type 2 diabetes mellitus, uncontrolled - cont Lantus and SSI    Chronic hypertension - hold Cozaar, Triamterene and Maxzide while hydrating     Cellulitis of right lower extremity - has resolved  Time spent in minutes: 35 DVT prophylaxis: SCEs Code Status: Full code Family Communication:  Disposition Plan: home when able to tolerate diet  Consultants:   none Procedures:   none Antimicrobials:  Anti-infectives (From admission, onward)   None        Objective: Vitals:   04/12/19 1958 04/12/19 2126 04/13/19 0600 04/13/19 0814  BP:  (!) 165/112 (!) 144/91   Pulse: 73 89 91   Resp: 20 17 16    Temp:  98.9 F (37.2 C) 98.4 F (36.9 C)   TempSrc:  Oral Oral   SpO2: 97% 100% 100% 98%  Weight:      Height:        Intake/Output Summary (Last 24 hours) at 04/13/2019 1049 Last data filed at 04/13/2019 0609 Gross per 24 hour  Intake 2972.9 ml  Output 2100 ml  Net 872.9 ml   Filed Weights   04/11/19 1900 04/12/19 0619  Weight: 101.2 kg 100.2 kg    Examination: General exam: Appears comfortable  HEENT: PERRLA, oral mucosa moist, no sclera icterus or thrush Respiratory system: Clear to auscultation. Respiratory effort normal. Cardiovascular system: S1 & S2 heard, RRR.   Chest: has tenderness in the mid ches/ over the sternum, to palpation Gastrointestinal system: Abdomen soft, non-tender, nondistended. Normal bowel sounds. Central nervous system: Alert and oriented. No focal neurological deficits. Extremities: No cyanosis, clubbing or edema Skin: No rashes or ulcers Psychiatry:  Mood & affect appropriate.     Data Reviewed: I have personally reviewed following labs and imaging studies  CBC: Recent Labs  Lab 04/11/19 1938  WBC 11.7*  NEUTROABS 7.6  HGB 13.2  HCT 38.6  MCV 78.8*  PLT 460*   Basic Metabolic Panel: Recent Labs  Lab 04/11/19 1938 04/12/19 0051 04/13/19 0802  NA 132* 133* 135  K 3.0* 3.2* 4.1  CL 93* 98 104  CO2 21* 24 23  GLUCOSE 247* 184* 159*  BUN 18 17 9   CREATININE 0.88 0.86 0.74  CALCIUM 9.6 8.3* 8.5*  MG 1.9  --   --   PHOS 2.6  --   --    GFR: Estimated Creatinine Clearance: 106.6 mL/min (by C-G formula based on SCr of 0.74 mg/dL). Liver Function Tests: Recent Labs  Lab 04/11/19 1938  AST 14*  ALT 33  ALKPHOS 144*  BILITOT 1.2  PROT 8.1  ALBUMIN 3.5   No results for input(s): LIPASE, AMYLASE in the last 168 hours. No results for input(s): AMMONIA in the last 168 hours.  Coagulation Profile: No results for input(s): INR, PROTIME in the last 168 hours. Cardiac Enzymes: Recent Labs  Lab 04/11/19 1938  TROPONINI <0.03   BNP (last 3 results) No results for input(s): PROBNP in the last 8760 hours. HbA1C: No results for input(s): HGBA1C in the last 72 hours. CBG: Recent Labs  Lab 04/12/19 0714 04/12/19 1135 04/12/19 1701 04/12/19 2118 04/13/19 0721  GLUCAP 145* 111* 96 135* 145*   Lipid Profile: No results for input(s): CHOL, HDL, LDLCALC, TRIG, CHOLHDL, LDLDIRECT in the last 72 hours. Thyroid Function Tests: No results for input(s): TSH, T4TOTAL, FREET4, T3FREE, THYROIDAB in the last 72 hours. Anemia Panel: No results for input(s): VITAMINB12, FOLATE, FERRITIN, TIBC, IRON, RETICCTPCT in the last 72 hours. Urine analysis:    Component Value Date/Time   COLORURINE YELLOW 02/05/2015 2125   APPEARANCEUR CLEAR 02/05/2015 2125   LABSPEC <1.005 (L) 02/05/2015 2125   PHURINE 6.5 02/05/2015 2125   GLUCOSEU NEGATIVE 02/05/2015 2125   HGBUR LARGE (A) 02/05/2015 2125   BILIRUBINUR NEGATIVE 02/05/2015 2125   KETONESUR NEGATIVE 02/05/2015 2125   PROTEINUR NEGATIVE 02/05/2015 2125   UROBILINOGEN 0.2 02/05/2015 2125   NITRITE NEGATIVE 02/05/2015 2125   LEUKOCYTESUR LARGE (A) 02/05/2015 2125   Sepsis Labs: @LABRCNTIP (procalcitonin:4,lacticidven:4) ) Recent Results (from the past 240 hour(s))  SARS Coronavirus 2 (CEPHEID - Performed in [facility] lab), Hosp Order     Status: None   Collection Time: 04/11/19  9:29 PM  Result Value Ref Range Status   SARS Coronavirus 2 NEGATIVE NEGATIVE Final    Comment: (NOTE) If result is NEGATIVE SARS-CoV-2 target nucleic acids are NOT DETECTED. The SARS-CoV-2 RNA is generally detectable in upper and lower  respiratory specimens during the acute phase of infection. The lowest  concentration of SARS-CoV-2 viral copies this assay can detect is 250  copies / mL. A negative result does not preclude  SARS-CoV-2 infection  and should not be used as the sole basis for treatment or other  patient management decisions.  A negative result may occur with  improper specimen collection / handling, submission of specimen other  than nasopharyngeal swab, presence of viral mutation(s) within the  areas targeted by this assay, and inadequate number of viral copies  (<250 copies / mL). A negative result must be combined with clinical  observations, patient history, and epidemiological information. If result is POSITIVE SARS-CoV-2 target nucleic acids are DETECTED. The SARS-CoV-2 RNA is generally detectable in upper and lower  respiratory specimens dur ing the acute phase of infection.  Positive  results are indicative of active infection with SARS-CoV-2.  Clinical  correlation with patient history and other diagnostic information is  necessary to determine patient infection status.  Positive results do  not rule out bacterial infection or co-infection with other viruses. If result is PRESUMPTIVE POSTIVE SARS-CoV-2 nucleic acids MAY BE PRESENT.   A presumptive positive result was obtained on the submitted specimen  and confirmed on repeat testing.  While 2019 novel coronavirus  (SARS-CoV-2) nucleic acids may be present in the submitted sample  additional confirmatory testing may be necessary for epidemiological  and / or clinical management purposes  to differentiate between  SARS-CoV-2 and other Sarbecovirus currently known to infect humans.  If clinically indicated additional testing with an alternate test  methodology [phone]) is advised. The SARS-CoV-2 RNA is generally  detectable in upper and lower respiratory sp ecimens during the acute  phase of infection. The expected result is Negative. Fact Sheet for Patients:  BoilerBrush.com.cy Fact Sheet for Healthcare Providers: https://pope.com/ This test is not yet approved or cleared by the  [place] FDA and has been authorized for detection and/or diagnosis of SARS-CoV-2 by FDA under an Emergency Use Authorization (EUA).  This EUA will remain in effect (meaning this test can be used) for the duration of the COVID-19 declaration under Section 564(b)(1) of the Act, 21 U.S.C. section 360bbb-3(b)(1), unless the authorization is terminated or revoked sooner. Performed at [facility], [address], [place], [place] [address]          Radiology Studies: Dg Abd Acute 2+v W 1v Chest  Result Date: 04/11/2019 CLINICAL DATA:  Nausea and chest pain. Patient left against medical advice from [facility]. EXAM: DG ABDOMEN ACUTE W/ 1V CHEST COMPARISON:  Report from abdominopelvic CT yesterday at [facility], images not available FINDINGS: The cardiomediastinal contours are normal. The lungs are clear. There is no free intra-abdominal air. No dilated bowel loops to suggest obstruction. Enteric contrast throughout the colon from recent CT. Ovoid densities projecting over the left lower quadrant of uncertain significance but may be ingested pills. Small volume of stool throughout the colon. No radiopaque calculi. No acute osseous abnormalities are seen. IMPRESSION: 1. Normal bowel gas pattern. Enteric contrast throughout the colon from CT yesterday at an outside institution. No free air. 2. Clear lungs. Electronically Signed   By: Narda Rutherford M.D.   On: 04/11/2019 20:46      Scheduled Meds: . insulin aspart  0-15 Units Subcutaneous TID WC  . insulin aspart  0-5 Units Subcutaneous QHS  . insulin glargine  20 Units Subcutaneous BID  . lidocaine  15 mL Oral Once  . pantoprazole (PROTONIX) IV  40 mg Intravenous Q24H  . pneumococcal 23 valent vaccine  0.5 mL Intramuscular Tomorrow-1000   Continuous Infusions: . 0.9 % NaCl with KCl 40 mEq / L 125 mL/hr (04/13/19 0658)     LOS: 0 days      Calvert Cantor, MD [facility] Pager: [email or web address] Password [facility] 04/13/2019, 10:49  AM

## 2019-04-14 LAB — HEMOGLOBIN A1C
Hgb A1c MFr Bld: 9 % — ABNORMAL HIGH (ref 4.8–5.6)
Mean Plasma Glucose: 211.6 mg/dL

## 2019-04-14 LAB — GLUCOSE, CAPILLARY
Glucose-Capillary: 174 mg/dL — ABNORMAL HIGH (ref 70–99)
Glucose-Capillary: 227 mg/dL — ABNORMAL HIGH (ref 70–99)
Glucose-Capillary: 158 mg/dL — ABNORMAL HIGH (ref 70–99)
Glucose-Capillary: 170 mg/dL — ABNORMAL HIGH (ref 70–99)

## 2019-04-14 MED ORDER — METOCLOPRAMIDE HCL 10 MG PO TABS
10.0000 mg | ORAL_TABLET | Freq: Four times a day (QID) | ORAL | Status: DC
  Administered 2019-04-14 – 2019-04-15 (×3): 10 mg via ORAL
  Filled 2019-04-14 (×3): qty 1

## 2019-04-14 MED ORDER — METOCLOPRAMIDE HCL 5 MG/ML IJ SOLN
10.0000 mg | Freq: Four times a day (QID) | INTRAMUSCULAR | Status: DC
  Administered 2019-04-14 (×2): 10 mg via INTRAVENOUS
  Filled 2019-04-14 (×2): qty 2

## 2019-04-14 MED ORDER — CLONAZEPAM 0.125 MG PO TBDP
0.1250 mg | ORAL_TABLET | Freq: Two times a day (BID) | ORAL | Status: DC
  Administered 2019-04-14 – 2019-04-15 (×3): 0.125 mg via ORAL
  Filled 2019-04-14 (×3): qty 1

## 2019-04-14 MED ORDER — HYDRALAZINE HCL 20 MG/ML IJ SOLN
5.0000 mg | Freq: Once | INTRAMUSCULAR | Status: AC
  Administered 2019-04-14: 5 mg via INTRAVENOUS
  Filled 2019-04-14: qty 1

## 2019-04-14 MED ORDER — FLUOXETINE HCL 10 MG PO CAPS
10.0000 mg | ORAL_CAPSULE | Freq: Every day | ORAL | Status: DC
  Administered 2019-04-14 – 2019-04-15 (×2): 10 mg via ORAL
  Filled 2019-04-14 (×2): qty 1

## 2019-04-14 MED ORDER — ZOLPIDEM TARTRATE 5 MG PO TABS
5.0000 mg | ORAL_TABLET | Freq: Every evening | ORAL | Status: DC | PRN
  Administered 2019-04-14: 5 mg via ORAL
  Filled 2019-04-14: qty 1

## 2019-04-14 MED ORDER — METOCLOPRAMIDE HCL 10 MG PO TABS: 10.0000 mg | ORAL_TABLET | Freq: Three times a day (TID) | ORAL | Status: DC

## 2019-04-14 MED ORDER — LOSARTAN POTASSIUM 50 MG PO TABS
50.0000 mg | ORAL_TABLET | Freq: Every day | ORAL | Status: DC
  Administered 2019-04-14 – 2019-04-15 (×2): 50 mg via ORAL
  Filled 2019-04-14 (×3): qty 1

## 2019-04-14 MED ORDER — ALPRAZOLAM 0.25 MG PO TABS: 0.2500 mg | ORAL_TABLET | Freq: Three times a day (TID) | ORAL | Status: DC

## 2019-04-14 NOTE — Progress Notes (Signed)
[facility] placed one time order for IV hydralazine for blood pressure being 166/104. Refer to [name] for dosage. Patient also requested something for sleep. [facility] level placed prn Ambien order refer to mar for dosage.  Will recheck patients blood pressure after receiving hydralazine.

## 2019-04-14 NOTE — Progress Notes (Signed)
PROGRESS NOTE    Theresa Malone   FXT:[id number]  DOB: April 30, 1981  DOA: 04/11/2019 PCP: Jannet Askew, MD   Brief Narrative:  Theresa Malone  is a 38 y.o.femalewith medical history significant ofanxiety, depression, type 2 diabetes, hypertension, morbid obesitywith a BMI of 42 who was coming to the emergency department with complaints of anxiety which has contributed to vomiting and abdominal pain. she states that her Prozac dose was increased from 10 to 20 mg daily and she has since had progressive anxiety and subsequently has developed nausea and vomiting. She was quite dehydrated when she presented to the hospital and thus admitted.    Subjective: Although anxiety is a little better, she continues to have bouts of vomiting which she feels are being triggered by her anxiety. She is drinking liquids but vomits them back up. She feels that the thought of eating solid food makes her want to vomit.     Assessment & Plan:   Principal Problem:   Intractable nausea, vomiting and abdominal pain- chest pain - suspect to due to anxiety- SSRI's can also cause nausea and vomiting and the higher dose of Prozac she was on may be contributing - chest pain was treated with a GI cocktail which helped - she is still having retching and vomiting today- will continue IVF until I see that she is tolerating food and drink - will need to add Reglan QID as she states Zofran is not working for her- ? If she has an element of gastroparesis as well - cont on IV PPI    Active Problems:    Severe anxiety - she feels that doubling her Prozac to 20 mg (for her depression) has made her anxiety quite severe -  I initially held Prozac to allow her blood level to come down- have resumed it today at 10 mg daily  - Have started PRN Xanax to control her anxiety symptoms but she is not using it more than 1 x a day- she feels she is constantly anxious and I think she maybe having panic attacks which are leading  up the the vomiting - today I have changed Xanax PRN to Clonazepam BID- I have explained that the goal is to get her anxiety controlled enough for her to be able to eat and to sleep at night and this is not a permanent treatment but a temporary one for he anxiety.  - I have also contacted the ACT team for further management of her anxiety and depression- she had an outpt appt with a psychiatrist but this was cancelled due to the COVID pandemic.   Dehydration, metabolic and respiratory alkalosis - cont IVF  Hypokalemia - continue to replace    Type 2 diabetes mellitus, uncontrolled - cont Lantus and SSI - f/u A1c    Chronic hypertension - holding Triamterene and Maxzide while hydrating - can resume Cozaar today     Cellulitis of right lower extremity - has resolved  Time spent in minutes: 35 DVT prophylaxis: SCEs Code Status: Full code Family Communication:  Disposition Plan: home when able to tolerate diet  Consultants:   none Procedures:   none Antimicrobials:  Anti-infectives (From admission, onward)   None       Objective: Vitals:   04/13/19 2309 04/13/19 2353 04/14/19 0611 04/14/19 0802  BP: (!) 164/115 (!) 150/93 (!) 178/109 (!) 153/108  Pulse:  85 90 82  Resp:   19   Temp:   98.2 F (36.8 C)  TempSrc:   Oral   SpO2:   100% 100%  Weight:      Height:        Intake/Output Summary (Last 24 hours) at 04/14/2019 1006 Last data filed at 04/14/2019 0600 Gross per 24 hour  Intake 2944.83 ml  Output -  Net 2944.83 ml   Filed Weights   04/11/19 1900 04/12/19 0619  Weight: 101.2 kg 100.2 kg    Examination:  General exam: Appears comfortable  HEENT: PERRLA, oral mucosa moist, no sclera icterus or thrush Respiratory system: Clear to auscultation. Respiratory effort normal. Cardiovascular system: S1 & S2 heard,  No murmurs  Gastrointestinal system: Abdomen soft,  Tender in mid abdomen, nondistended. Normal bowel sounds   Central nervous system: Alert  and oriented. No focal neurological deficits. Extremities: No cyanosis, clubbing or edema Skin: No rashes or ulcers Psychiatry:  Mood & affect appropriate.     Data Reviewed: I have personally reviewed following labs and imaging studies  CBC: Recent Labs  Lab 04/11/19 1938  WBC 11.7*  NEUTROABS 7.6  HGB 13.2  HCT 38.6  MCV 78.8*  PLT 460*   Basic Metabolic Panel: Recent Labs  Lab 04/11/19 1938 04/12/19 0051 04/13/19 0802  NA 132* 133* 135  K 3.0* 3.2* 4.1  CL 93* 98 104  CO2 21* 24 23  GLUCOSE 247* 184* 159*  BUN CREATININE 0.88 0.86 0.74  CALCIUM 9.6 8.3* 8.5*  MG 1.9  --   --   PHOS 2.6  --   --    GFR: Estimated Creatinine Clearance: 106.6 mL/min (by C-G formula based on SCr of 0.74 mg/dL). Liver Function Tests: Recent Labs  Lab 04/11/19 1938  AST 14*  ALT 33  ALKPHOS 144*  BILITOT 1.2  PROT 8.1  ALBUMIN 3.5   No results for input(s): LIPASE, AMYLASE in the last 168 hours. No results for input(s): AMMONIA in the last 168 hours. Coagulation Profile: No results for input(s): INR, PROTIME in the last 168 hours. Cardiac Enzymes: Recent Labs  Lab 04/11/19 1938  TROPONINI <0.03   BNP (last 3 results) No results for input(s): PROBNP in the last 8760 hours. HbA1C: No results for input(s): HGBA1C in the last 72 hours. CBG: Recent Labs  Lab 04/13/19 0721 04/13/19 1107 04/13/19 1614 04/13/19 2142 04/14/19 0733  GLUCAP 145* 116* 156* 170* 174*   Lipid Profile: No results for input(s): CHOL, HDL, LDLCALC, TRIG, CHOLHDL, LDLDIRECT in the last 72 hours. Thyroid Function Tests: No results for input(s): TSH, T4TOTAL, FREET4, T3FREE, THYROIDAB in the last 72 hours. Anemia Panel: No results for input(s): VITAMINB12, FOLATE, FERRITIN, TIBC, IRON, RETICCTPCT in the last 72 hours. Urine analysis:    Component Value Date/Time   COLORURINE YELLOW 02/05/2015 2125   APPEARANCEUR CLEAR 02/05/2015 2125   LABSPEC <1.005 (L) 02/05/2015 2125    PHURINE 6.5 02/05/2015 2125   GLUCOSEU NEGATIVE 02/05/2015 2125   HGBUR LARGE (A) 02/05/2015 2125   BILIRUBINUR NEGATIVE 02/05/2015 2125   KETONESUR NEGATIVE 02/05/2015 2125   PROTEINUR NEGATIVE 02/05/2015 2125   UROBILINOGEN 0.2 02/05/2015 2125   NITRITE NEGATIVE 02/05/2015 2125   LEUKOCYTESUR LARGE (A) 02/05/2015 2125   Sepsis Labs: (procalcitonin:4,lacticidven:4) ) Recent Results (from the past 240 hour(s))  SARS Coronavirus 2 (CEPHEID - Performed in [facility] hospital lab), Hosp Order     Status: None   Collection Time: 04/11/19  9:29 PM  Result Value Ref Range Status   SARS Coronavirus 2 NEGATIVE NEGATIVE Final  Comment: (NOTE) If result is NEGATIVE SARS-CoV-2 target nucleic acids are NOT DETECTED. The SARS-CoV-2 RNA is generally detectable in upper and lower  respiratory specimens during the acute phase of infection. The lowest  concentration of SARS-CoV-2 viral copies this assay can detect is 250  copies / mL. A negative result does not preclude SARS-CoV-2 infection  and should not be used as the sole basis for treatment or other  patient management decisions.  A negative result may occur with  improper specimen collection / handling, submission of specimen other  than nasopharyngeal swab, presence of viral mutation(s) within the  areas targeted by this assay, and inadequate number of viral copies  (<250 copies / mL). A negative result must be combined with clinical  observations, patient history, and epidemiological information. If result is POSITIVE SARS-CoV-2 target nucleic acids are DETECTED. The SARS-CoV-2 RNA is generally detectable in upper and lower  respiratory specimens dur ing the acute phase of infection.  Positive  results are indicative of active infection with SARS-CoV-2.  Clinical  correlation with patient history and other diagnostic information is  necessary to determine patient infection status.  Positive results do  not rule out  bacterial infection or co-infection with other viruses. If result is PRESUMPTIVE POSTIVE SARS-CoV-2 nucleic acids MAY BE PRESENT.   A presumptive positive result was obtained on the submitted specimen  and confirmed on repeat testing.  While 2019 novel coronavirus  (SARS-CoV-2) nucleic acids may be present in the submitted sample  additional confirmatory testing may be necessary for epidemiological  and / or clinical management purposes  to differentiate between  SARS-CoV-2 and other Sarbecovirus currently known to infect humans.  If clinically indicated additional testing with an alternate test  methodology [id number]) is advised. The SARS-CoV-2 RNA is generally  detectable in upper and lower respiratory sp ecimens during the acute  phase of infection. The expected result is Negative. Fact Sheet for Patients:  BoilerBrush.com.cyhttps://www.fda.gov/media/136312/download Fact Sheet for Healthcare Providers: https://pope.com/https://www.fda.gov/media/136313/download This test is not yet approved or cleared by the [place] FDA and has been authorized for detection and/or diagnosis of SARS-CoV-2 by FDA under an Emergency Use Authorization (EUA).  This EUA will remain in effect (meaning this test can be used) for the duration of the COVID-19 declaration under Section 564(b)(1) of the Act, 21 U.S.C. section 360bbb-3(b)(1), unless the authorization is terminated or revoked sooner. Performed at [facility], [address] [place], [place] [address]          Radiology Studies: No results found.    Scheduled Meds: . clonazepam  0.125 mg Oral BID  . FLUoxetine  10 mg Oral Daily  . insulin aspart  0-15 Units Subcutaneous TID WC  . insulin aspart  0-5 Units Subcutaneous QHS  . insulin glargine  20 Units Subcutaneous BID  . metoCLOPramide (REGLAN) injection  10 mg Intravenous Q6H  . pantoprazole (PROTONIX) IV  40 mg Intravenous Q24H   Continuous Infusions: . 0.9 % NaCl with KCl 40 mEq / L 125 mL/hr at 04/14/19  0600     LOS: 1 day      Calvert CantorSaima Deonte Otting, MD [facility] Pager: [email or web address] Password [email or web address] 04/14/2019, 10:06 AM

## 2019-04-14 NOTE — Progress Notes (Signed)
Patient called nurses station stating she was having chest pain. Nurse came to room. Patient stated chest pain is in the middle of chest non radiating. Mid level made aware no new orders at this time.

## 2019-04-14 NOTE — BH Assessment (Signed)
[facility] Assessment Progress Note   TTS attempted to assess patient, but RN states that she is new and does not know where the TTS machine is or how to set it up.  Therefore, she stated that she would need to seek assistance and would contact us when the patient is ready to be seen.

## 2019-04-14 NOTE — BH Assessment (Signed)
[facility] Assessment Progress Note     Patient was seen by FNP, Denzil Magnuson, NP at [facility] for medication changes, but patient refused.  Patient is not suicidal/homicidal or psychotic and does not meet inpatient admission criteria and can follow-up with her PCP and a possible referral to [facility] or [facility] in [place].
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
## 2019-04-15 DIAGNOSIS — L0390 Cellulitis, unspecified: Secondary | ICD-10-CM

## 2019-04-15 DIAGNOSIS — I10 Essential (primary) hypertension: Secondary | ICD-10-CM

## 2019-04-15 DIAGNOSIS — E871 Hypo-osmolality and hyponatremia: Secondary | ICD-10-CM

## 2019-04-15 DIAGNOSIS — E876 Hypokalemia: Secondary | ICD-10-CM

## 2019-04-15 LAB — GLUCOSE, CAPILLARY
Glucose-Capillary: 224 mg/dL — ABNORMAL HIGH (ref 70–99)
Glucose-Capillary: 167 mg/dL — ABNORMAL HIGH (ref 70–99)

## 2019-04-15 MED ORDER — METOPROLOL TARTRATE 5 MG/5ML IV SOLN
5.0000 mg | Freq: Once | INTRAVENOUS | Status: AC
  Administered 2019-04-15: 5 mg via INTRAVENOUS
  Filled 2019-04-15: qty 5

## 2019-04-15 MED ORDER — METOCLOPRAMIDE HCL 5 MG PO TABS: 5.0000 mg | ORAL_TABLET | Freq: Three times a day (TID) | ORAL | 0 refills | Status: DC

## 2019-04-15 MED ORDER — FLUOXETINE HCL 10 MG PO CAPS: 10.0000 mg | ORAL_CAPSULE | Freq: Every day | ORAL | 1 refills | Status: DC

## 2019-04-15 MED ORDER — CLONAZEPAM 0.125 MG PO TBDP: 0.1250 mg | ORAL_TABLET | Freq: Two times a day (BID) | ORAL | 0 refills | Status: DC

## 2019-04-15 NOTE — Discharge Summary (Signed)
Physician Discharge Summary  Berenice BoutonKelly R Downen NWG:[id number]:[id number] DOB: 06-18-1981 DOA: 04/11/2019  PCP: Jannet AskewLlamas, Jewel C, MD  Admit date: 04/11/2019 Discharge date: 04/15/2019  Admitted From: Home Disposition:  Home  Recommendations for Outpatient Follow-up:  1. Follow up with PCP in 1-2 weeks 2. Please obtain BMP/CBC in one week     Discharge Condition: Stable CODE STATUS: FULL Diet recommendation: Heart Healthy   Brief/Interim Summary:  38 y.o.femalewith medical history significant ofanxiety, depression, type 2 diabetes, hypertension, morbid obesitywith a BMI of 42 who was coming to the emergency department with complaints of anxietywhich has contributed to vomiting and abdominal pain.she states that her Prozac dose was increased from 10 to 20 mg daily and she has since had progressive anxiety and subsequently has developed nausea and vomiting. She was quite dehydrated when she presented to the hospital and thus admitted.  Discharge Diagnoses:  Intractable nausea, vomiting and abdominal pain- chest pain - suspect to due to anxiety and component of gastroparesis - SSRI's can also cause nausea and vomiting and the higher dose of Prozac she was on may be contributing - chest pain was treated with a GI cocktail which relieved it - she was placed on bowel rest initially and diet slowly advanced -prior to d/c, the patient demonstrated that she was able to tolerate a carb modified diet without any difficulty - will need to add Reglan QID as she states Zofran is not working for her- ? If she has an element of gastroparesis as well - cont on IV PPI    Active Problems:    Severe anxiety - she feels that doubling her Prozac to 20 mg (for her depression) has made her anxiety quite severe -  Dr. Butler Denmarkizwan initially held Prozac to allow her blood level to come down -pt's fluoxetine resumed at 10 mg daily -ACT team was consulted, but pt refused any further medication changes - Have started  PRN Xanax to control her anxiety symptoms but she is not using it more than 1 x a day- she feels she is constantly anxious  -it was felt pt may have been having panic attacks leading to the emesis - as a result, pt started on clonazepam BID- I have explained that the goal is to get her anxiety controlled enough for her to be able to eat and to sleep at night and this is not a permanent treatment but a temporary one for he anxiety.  - I have also contacted the ACT team for further management of her anxiety and depression- she had an outpt appt with a psychiatrist but this was cancelled due to the COVID pandemic.  -pt refused further changes and recommendations from ACT team  Dehydration, metabolic and respiratory alkalosis - cont IVF  Hypokalemia - continue to replace  Type 2 diabetes mellitus, uncontrolled with hyperglycemia - cont Lantus and SSI - 04/13/19-A1c 9.0  Hypertension - holding Triamterene and Maxzide while hydrating during hospitalization-->restart after d/c - can resume Cozaar  Cellulitis of right lower extremity - has resolved    Discharge Instructions   Allergies as of 04/15/2019      Reactions   Lisinopril Cough   cough      Medication List    STOP taking these medications   acetaminophen 500 MG tablet Commonly known as:  TYLENOL   clindamycin 300 MG capsule Commonly known as:  CLEOCIN   doxycycline 100 MG tablet Commonly known as:  VIBRA-TABS   traMADol 50 MG tablet Commonly known as:  Ultram  TAKE these medications   clonazepam 0.125 MG disintegrating tablet Commonly known as:  KLONOPIN Take 1 tablet (0.125 mg total) by mouth 2 (two) times daily.   FLUoxetine 10 MG capsule Commonly known as:  PROZAC Take 1 capsule (10 mg total) by mouth daily. Start taking on:  Apr 16, 2019 What changed:    medication strength  how much to take   Lantus 100 UNIT/ML injection Generic drug:  insulin glargine Inject 45 Units into the skin  2 (two) times daily.   losartan 50 MG tablet Commonly known as:  COZAAR Take 1 tablet by mouth daily.   Melatonin 3 MG Tabs Take 1 tablet by mouth at bedtime as needed.   metFORMIN 1000 MG (MOD) 24 hr tablet Commonly known as:  GLUMETZA Take 1 tablet by mouth 2 (two) times daily.   metoCLOPramide 5 MG tablet Commonly known as:  REGLAN Take 1 tablet (5 mg total) by mouth 3 (three) times daily before meals.   pantoprazole 40 MG tablet Commonly known as:  PROTONIX Take 1 tablet by mouth daily.   triamterene-hydrochlorothiazide 75-50 MG tablet Commonly known as:  MAXZIDE Take 1 tablet by mouth daily.       Allergies  Allergen Reactions  . Lisinopril Cough    cough    Consultations:  none   Procedures/Studies: Dg Abd Acute 2+v W 1v Chest  Result Date: 04/11/2019 CLINICAL DATA:  Nausea and chest pain. Patient left against medical advice from [facility]. EXAM: DG ABDOMEN ACUTE W/ 1V CHEST COMPARISON:  Report from abdominopelvic CT yesterday at [facility], images not available FINDINGS: The cardiomediastinal contours are normal. The lungs are clear. There is no free intra-abdominal air. No dilated bowel loops to suggest obstruction. Enteric contrast throughout the colon from recent CT. Ovoid densities projecting over the left lower quadrant of uncertain significance but may be ingested pills. Small volume of stool throughout the colon. No radiopaque calculi. No acute osseous abnormalities are seen. IMPRESSION: 1. Normal bowel gas pattern. Enteric contrast throughout the colon from CT yesterday at an outside institution. No free air. 2. Clear lungs. Electronically Signed   By: Narda Rutherford M.D.   On: 04/11/2019 20:46         Discharge Exam: Vitals:   04/15/19 0510 04/15/19 1352  BP: 119/77 (!) 138/99  Pulse: 92 100  Resp: 16 16  Temp: 98.3 F (36.8 C) 98.3 F (36.8 C)  SpO2: 100% 100%   Vitals:   04/14/19 2257 04/15/19 0230 04/15/19 0510 04/15/19 1352  BP: (!)  155/97 122/83 119/77 (!) 138/99  Pulse: 97 (!) 122 92 100  Resp:   16 16  Temp:   98.3 F (36.8 C) 98.3 F (36.8 C)  TempSrc:   Oral Oral  SpO2:   100% 100%  Weight:      Height:        General: Pt is alert, awake, not in acute distress Cardiovascular: RRR, S1/S2 +, no rubs, no gallops Respiratory: CTA bilaterally, no wheezing, no rhonchi Abdominal: Soft, NT, ND, bowel sounds + Extremities: trace LE edema, no cyanosis   The results of significant diagnostics from this hospitalization (including imaging, microbiology, ancillary and laboratory) are listed below for reference.    Significant Diagnostic Studies: Dg Abd Acute 2+v W 1v Chest  Result Date: 04/11/2019 CLINICAL DATA:  Nausea and chest pain. Patient left against medical advice from [facility]. EXAM: DG ABDOMEN ACUTE W/ 1V CHEST COMPARISON:  Report from abdominopelvic CT yesterday at [facility], images  not available FINDINGS: The cardiomediastinal contours are normal. The lungs are clear. There is no free intra-abdominal air. No dilated bowel loops to suggest obstruction. Enteric contrast throughout the colon from recent CT. Ovoid densities projecting over the left lower quadrant of uncertain significance but may be ingested pills. Small volume of stool throughout the colon. No radiopaque calculi. No acute osseous abnormalities are seen. IMPRESSION: 1. Normal bowel gas pattern. Enteric contrast throughout the colon from CT yesterday at an outside institution. No free air. 2. Clear lungs. Electronically Signed   By: Narda Rutherford M.D.   On: 04/11/2019 20:46     Microbiology: Recent Results (from the past 240 hour(s))  SARS Coronavirus 2 (CEPHEID - Performed in [facility] hospital lab), Hosp Order     Status: None   Collection Time: 04/11/19  9:29 PM  Result Value Ref Range Status   SARS Coronavirus 2 NEGATIVE NEGATIVE Final    Comment: (NOTE) If result is NEGATIVE SARS-CoV-2 target nucleic acids are NOT DETECTED. The  SARS-CoV-2 RNA is generally detectable in upper and lower  respiratory specimens during the acute phase of infection. The lowest  concentration of SARS-CoV-2 viral copies this assay can detect is 250  copies / mL. A negative result does not preclude SARS-CoV-2 infection  and should not be used as the sole basis for treatment or other  patient management decisions.  A negative result may occur with  improper specimen collection / handling, submission of specimen other  than nasopharyngeal swab, presence of viral mutation(s) within the  areas targeted by this assay, and inadequate number of viral copies  (<250 copies / mL). A negative result must be combined with clinical  observations, patient history, and epidemiological information. If result is POSITIVE SARS-CoV-2 target nucleic acids are DETECTED. The SARS-CoV-2 RNA is generally detectable in upper and lower  respiratory specimens dur ing the acute phase of infection.  Positive  results are indicative of active infection with SARS-CoV-2.  Clinical  correlation with patient history and other diagnostic information is  necessary to determine patient infection status.  Positive results do  not rule out bacterial infection or co-infection with other viruses. If result is PRESUMPTIVE POSTIVE SARS-CoV-2 nucleic acids MAY BE PRESENT.   A presumptive positive result was obtained on the submitted specimen  and confirmed on repeat testing.  While 2019 novel coronavirus  (SARS-CoV-2) nucleic acids may be present in the submitted sample  additional confirmatory testing may be necessary for epidemiological  and / or clinical management purposes  to differentiate between  SARS-CoV-2 and other Sarbecovirus currently known to infect humans.  If clinically indicated additional testing with an alternate test  methodology [phone]) is advised. The SARS-CoV-2 RNA is generally  detectable in upper and lower respiratory sp ecimens during the acute   phase of infection. The expected result is Negative. Fact Sheet for Patients:  BoilerBrush.com.cy Fact Sheet for Healthcare Providers: https://pope.com/ This test is not yet approved or cleared by the [place] FDA and has been authorized for detection and/or diagnosis of SARS-CoV-2 by FDA under an Emergency Use Authorization (EUA).  This EUA will remain in effect (meaning this test can be used) for the duration of the COVID-19 declaration under Section 564(b)(1) of the Act, 21 U.S.C. section 360bbb-3(b)(1), unless the authorization is terminated or revoked sooner. Performed at [facility], [address], [place], [place] [address]      Labs: Basic Metabolic Panel: Recent Labs  Lab 04/11/19 1938 04/12/19 9678 04/13/19 9381  NA 132* 133* 135  K 3.0* 3.2* 4.1  CL 93* 98 104  CO2 21* 24 23  GLUCOSE 247* 184* 159*  BUN CREATININE 0.88 0.86 0.74  CALCIUM 9.6 8.3* 8.5*  MG 1.9  --   --   PHOS 2.6  --   --    Liver Function Tests: Recent Labs  Lab 04/11/19 1938  AST 14*  ALT 33  ALKPHOS 144*  BILITOT 1.2  PROT 8.1  ALBUMIN 3.5   No results for input(s): LIPASE, AMYLASE in the last 168 hours. No results for input(s): AMMONIA in the last 168 hours. CBC: Recent Labs  Lab 04/11/19 1938  WBC 11.7*  NEUTROABS 7.6  HGB 13.2  HCT 38.6  MCV 78.8*  PLT 460*   Cardiac Enzymes: Recent Labs  Lab 04/11/19 1938  TROPONINI <0.03   BNP: Invalid input(s): POCBNP CBG: Recent Labs  Lab 04/14/19 0733 04/14/19 1112 04/14/19 1616 04/14/19 2132 04/15/19 0715  GLUCAP 174* 227* 158* 170* 224*    Time coordinating discharge:  36 minutes  Signed:  Catarina Hartshorn, DO [facility] Pager: [phone] 04/15/2019, 2:22 PM

## 2019-04-15 NOTE — Plan of Care (Signed)

## 2019-04-15 NOTE — Progress Notes (Signed)
Patients heart rate went up to 130-140's on heart monitor. Paged mid level. Mid level placed order for stat 12 lead EKG. Patient is resting in bed with eyes closed breathing is even and nonlabored. Patient current heart rate at this time is 122. RT Asher Muir is performing 12 lead EKG. Will continue to monitor throughout shift.

## 2019-05-19 ENCOUNTER — Emergency Department (HOSPITAL_COMMUNITY): Payer: Medicaid Other

## 2019-05-19 ENCOUNTER — Other Ambulatory Visit: Payer: Self-pay

## 2019-05-19 ENCOUNTER — Encounter (HOSPITAL_COMMUNITY): Payer: Self-pay | Admitting: Emergency Medicine

## 2019-05-19 ENCOUNTER — Emergency Department (HOSPITAL_COMMUNITY)
Admission: EM | Admit: 2019-05-19 | Discharge: 2019-05-20 | Disposition: A | Discharge status: Home / Self Care | Payer: Medicaid Other | Attending: Emergency Medicine | Admitting: Emergency Medicine

## 2019-05-19 DIAGNOSIS — I10 Essential (primary) hypertension: Secondary | ICD-10-CM | POA: Insufficient documentation

## 2019-05-19 DIAGNOSIS — E119 Type 2 diabetes mellitus without complications: Secondary | ICD-10-CM | POA: Insufficient documentation

## 2019-05-19 DIAGNOSIS — Z7984 Long term (current) use of oral hypoglycemic drugs: Secondary | ICD-10-CM | POA: Insufficient documentation

## 2019-05-19 DIAGNOSIS — R079 Chest pain, unspecified: Secondary | ICD-10-CM

## 2019-05-19 DIAGNOSIS — R0789 Other chest pain: Secondary | ICD-10-CM | POA: Diagnosis not present

## 2019-05-19 DIAGNOSIS — R109 Unspecified abdominal pain: Secondary | ICD-10-CM | POA: Diagnosis present

## 2019-05-19 DIAGNOSIS — Z79899 Other long term (current) drug therapy: Secondary | ICD-10-CM | POA: Insufficient documentation

## 2019-05-19 LAB — HEPATIC FUNCTION PANEL
ALT: 19 U/L (ref 0–44)
AST: 16 U/L (ref 15–41)
Albumin: 3.7 g/dL (ref 3.5–5.0)
Alkaline Phosphatase: 100 U/L (ref 38–126)
Bilirubin, Direct: 0.1 mg/dL (ref 0.0–0.2)
Indirect Bilirubin: 0.7 mg/dL (ref 0.3–0.9)
Total Bilirubin: 0.8 mg/dL (ref 0.3–1.2)
Total Protein: 8.1 g/dL (ref 6.5–8.1)

## 2019-05-19 LAB — BASIC METABOLIC PANEL
Anion gap: 13 (ref 5–15)
BUN: 12 mg/dL (ref 6–20)
CO2: 23 mmol/L (ref 22–32)
Calcium: 9.7 mg/dL (ref 8.9–10.3)
Chloride: 94 mmol/L — ABNORMAL LOW (ref 98–111)
Creatinine, Ser: 0.82 mg/dL (ref 0.44–1.00)
GFR calc Af Amer: >60 mL/min (ref >60)
GFR calc non Af Amer: >60 mL/min (ref >60)
Glucose, Bld: 348 mg/dL — ABNORMAL HIGH (ref 70–99)
Potassium: 3.9 mmol/L (ref 3.5–5.1)
Sodium: 130 mmol/L — ABNORMAL LOW (ref 135–145)

## 2019-05-19 LAB — CBC
HCT: 34.9 % — ABNORMAL LOW (ref 36.0–46.0)
Hemoglobin: 11.7 g/dL — ABNORMAL LOW (ref 12.0–15.0)
MCH: 27 pg (ref 26.0–34.0)
MCHC: 33.5 g/dL (ref 30.0–36.0)
MCV: 80.6 fL (ref 80.0–100.0)
Platelets: 393 10*3/uL (ref 150–400)
RBC: 4.33 MIL/uL (ref 3.87–5.11)
RDW: 14.6 % (ref 11.5–15.5)
WBC: 9.7 10*3/uL (ref 4.0–10.5)
nRBC: 0 % (ref 0.0–0.2)

## 2019-05-19 LAB — TROPONIN I (HIGH SENSITIVITY)
Troponin I (High Sensitivity): 4 ng/L (ref <18)
Troponin I (High Sensitivity): 2 ng/L (ref <18)

## 2019-05-19 LAB — LIPASE, BLOOD: Lipase: 37 U/L (ref 11–51)

## 2019-05-19 LAB — D-DIMER, QUANTITATIVE: D-Dimer, Quant: 0.31 ug/mL-FEU (ref 0.00–0.50)

## 2019-05-19 MED ORDER — ONDANSETRON 4 MG PO TBDP
ORAL_TABLET | ORAL | Status: AC
  Administered 2019-05-19: 4 mg via ORAL
  Filled 2019-05-19: qty 1

## 2019-05-19 MED ORDER — LORAZEPAM 2 MG/ML IJ SOLN
1.0000 mg | Freq: Once | INTRAMUSCULAR | Status: AC
  Administered 2019-05-20: 1 mg via INTRAVENOUS
  Filled 2019-05-19: qty 1

## 2019-05-19 MED ORDER — ONDANSETRON HCL 4 MG/2ML IJ SOLN
4.0000 mg | Freq: Once | INTRAMUSCULAR | Status: AC
  Administered 2019-05-19: 4 mg via INTRAVENOUS
  Filled 2019-05-19: qty 2

## 2019-05-19 MED ORDER — MORPHINE SULFATE (PF) 4 MG/ML IV SOLN
4.0000 mg | Freq: Once | INTRAVENOUS | Status: AC
  Administered 2019-05-19: 4 mg via INTRAVENOUS
  Filled 2019-05-19: qty 1

## 2019-05-19 MED ORDER — SODIUM CHLORIDE 0.9% FLUSH
3.0000 mL | Freq: Once | INTRAVENOUS | Status: AC
  Administered 2019-05-19: 3 mL via INTRAVENOUS

## 2019-05-19 MED ORDER — SODIUM CHLORIDE 0.9 % IV BOLUS
1000.0000 mL | Freq: Once | INTRAVENOUS | Status: AC
  Administered 2019-05-19: 1000 mL via INTRAVENOUS

## 2019-05-19 NOTE — ED Provider Notes (Signed)
[facility] EMERGENCY DEPARTMENT Provider Note   CSN: [id number] Arrival date & time: 05/19/19  2036    History   Chief Complaint Chief Complaint  Patient presents with  . Chest Pain    HPI Berenice BoutonKelly R Mccallum is a 38 y.o. female.     Pt presents to the ED today with cp, n/v, abd pain, and sob.  Pain has been going on for 3 days.  It is worse today.  She denies any f/c.  The pt denies any known Covid exposures.     Past Medical History:  Diagnosis Date  . Anxiety    panic attacks  . Depression   . Diabetes mellitus   . Hypertension   . Obesity     Patient Active Problem List   Diagnosis Date Noted  . Cellulitis   . Intractable vomiting with nausea 04/13/2019  . Acute respiratory alkalosis   . Intractable vomiting   . Chest pain   . Cellulitis of right lower extremity 04/12/2019  . Intractable abdominal pain 04/11/2019  . Metabolic alkalosis 04/11/2019  . Respiratory alkalosis 04/11/2019  . Volume depletion 04/11/2019  . Hypokalemia 04/11/2019  . Hyponatremia 04/11/2019  . Yeast vaginitis 03/13/2018  . Type 2 diabetes mellitus, uncontrolled 02/07/2016  . Chronic hypertension 02/07/2016  . Menorrhagia with regular cycle 02/07/2016  . Frequent loose stools 02/07/2016  . Status post cesarean section 01/25/2015  . Hidradenitis axillaris 09/15/2014  . Susceptible to varicella (non-immune), currently pregnant 07/24/2014  . Morbid obesity with BMI of 45.0-49.9, adult (HCC) 03/03/2014    Past Surgical History:  Procedure Laterality Date  . CESAREAN SECTION     C/S x 2  . CESAREAN SECTION N/A 01/24/2015   Procedure: CESAREAN SECTION;  Surgeon: Catalina AntiguaPeggy Constant, MD;  Location: [facility];  Service: Obstetrics;  Laterality: N/A;  . TUBAL LIGATION       OB History    Gravida  4   Para  4   Term  2   Preterm  2   AB      Living  4     SAB      TAB      Ectopic      Multiple  0   Live Births  4            Home Medications    Prior to Admission  medications   Medication Sig Start Date End Date Taking? Authorizing Provider  FLUoxetine (PROZAC) 10 MG capsule Take 1 capsule (10 mg total) by mouth daily. 04/16/19  Yes Tat, Onalee Huaavid, MD  losartan (COZAAR) 50 MG tablet Take 50 mg by mouth daily.   Yes [provider]  metFORMIN (GLUMETZA) 1000 MG (MOD) 24 hr tablet Take 1 tablet by mouth 2 (two) times daily.   Yes [provider]  triamterene-hydrochlorothiazide (MAXZIDE) 75-50 MG tablet Take 1 tablet by mouth daily.   Yes [provider]  clonazepam (KLONOPIN) 0.125 MG disintegrating tablet Take 1 tablet (0.125 mg total) by mouth 2 (two) times daily. Patient not taking: Reported on 05/19/2019 04/15/19   TatOnalee Hua, David, MD  LORazepam (ATIVAN) 1 MG tablet Take 1 tablet (1 mg total) by mouth 3 (three) times daily as needed for anxiety. 05/20/19   Jacalyn LefevreHaviland, Jesselyn Rask, MD  metoCLOPramide (REGLAN) 5 MG tablet Take 1 tablet (5 mg total) by mouth 3 (three) times daily before meals. Patient not taking: Reported on 05/19/2019 04/15/19   Catarina Hartshornat, David, MD  ondansetron (ZOFRAN ODT) 4 MG disintegrating tablet Take  1 tablet (4 mg total) by mouth every 8 (eight) hours as needed. 05/20/19   Isla Pence, MD    Family History Family History  Problem Relation Age of Onset  . Diabetes Paternal Grandfather   . Cancer Paternal Grandfather        liver  . Cancer Paternal Grandmother        liver  . Hypertension Father   . Diabetes Mother   . Hypertension Mother   . Diabetes Maternal Uncle   . Diabetes Maternal Grandmother   . Cancer Maternal Grandfather   . Diabetes Daughter        boarderline   . Asthma Daughter   . Bronchitis Daughter   . Bronchitis Son   . Bronchitis Daughter     Social History Social History   Tobacco Use  . Smoking status: Never Smoker  . Smokeless tobacco: Never Used  Substance Use Topics  . Alcohol use: Yes    Comment: wine occ  . Drug use: No     Allergies   Lisinopril   Review of Systems Review  of Systems  Respiratory: Positive for shortness of breath.   Cardiovascular: Positive for chest pain.  Gastrointestinal: Positive for abdominal pain, nausea and vomiting.  All other systems reviewed and are negative.    Physical Exam Updated Vital Signs BP (!) 173/98   Pulse (!) 105   Temp 98.5 F (36.9 C) (Oral)   Resp 15   LMP 05/11/2019   SpO2 100%   Physical Exam Vitals signs and nursing note reviewed.  Constitutional:      Appearance: She is well-developed. She is obese.  HENT:     Head: Normocephalic and atraumatic.  Eyes:     Extraocular Movements: Extraocular movements intact.     Pupils: Pupils are equal, round, and reactive to light.  Neck:     Musculoskeletal: Normal range of motion and neck supple.  Cardiovascular:     Rate and Rhythm: Normal rate and regular rhythm.     Heart sounds: Normal heart sounds.  Pulmonary:     Effort: Pulmonary effort is normal.     Breath sounds: Normal breath sounds.  Abdominal:     General: Bowel sounds are normal.     Palpations: Abdomen is soft.  Musculoskeletal: Normal range of motion.  Skin:    General: Skin is warm and dry.     Capillary Refill: Capillary refill takes less than 2 seconds.  Neurological:     General: No focal deficit present.     Mental Status: She is alert and oriented to person, place, and time.  Psychiatric:        Mood and Affect: Mood is anxious.      ED Treatments / Results  Labs (all labs ordered are listed, but only abnormal results are displayed) Labs Reviewed  BASIC METABOLIC PANEL - Abnormal; Notable for the following components:      Result Value   Sodium 130 (*)    Chloride 94 (*)    Glucose, Bld 348 (*)    All other components within normal limits  CBC - Abnormal; Notable for the following components:   Hemoglobin 11.7 (*)    HCT 34.9 (*)    All other components within normal limits  CBG MONITORING, ED - Abnormal; Notable for the following components:   Glucose-Capillary 421  (*)    All other components within normal limits  TROPONIN I (HIGH SENSITIVITY)  TROPONIN I (HIGH SENSITIVITY)  HEPATIC FUNCTION  PANEL  LIPASE, BLOOD  D-DIMER, QUANTITATIVE (NOT AT [facility])  POC URINE PREG, ED    EKG EKG Interpretation  Date/Time:  Tuesday May 19 2019 21:22:47 EDT Ventricular Rate:  92 PR Interval:    QRS Duration: 75 QT Interval:  379 QTC Calculation: 469 R Axis:   42 Text Interpretation:  Sinus rhythm Baseline wander in lead(s) V3 No significant change since last tracing Confirmed by Jacalyn LefevreHaviland, Dawnelle Warman [phone]([id number]) on 05/19/2019 10:54:19 PM   Radiology Dg Chest Port 1 View  Result Date: 05/19/2019 CLINICAL DATA:  Chest pain x3 days. EXAM: PORTABLE CHEST 1 VIEW COMPARISON:  None. FINDINGS: The heart size and mediastinal contours are within normal limits. Both lungs are clear. The visualized skeletal structures are unremarkable. IMPRESSION: No active disease. Electronically Signed   By: Katherine Mantlehristopher  Green M.D.   On: 05/19/2019 22:14    Procedures Procedures (including critical care time)  Medications Ordered in ED Medications  ketorolac (TORADOL) 30 MG/ML injection 30 mg (has no administration in time range)  sodium chloride flush (NS) 0.9 % injection 3 mL (3 mLs Intravenous Given 05/19/19 2135)  ondansetron (ZOFRAN-ODT) 4 MG disintegrating tablet (4 mg Oral Given 05/19/19 2208)  morphine 4 MG/ML injection 4 mg (4 mg Intravenous Given 05/19/19 2316)  sodium chloride 0.9 % bolus 1,000 mL (1,000 mLs Intravenous New Bag/Given 05/19/19 2316)  ondansetron (ZOFRAN) injection 4 mg (4 mg Intravenous Given 05/19/19 2315)  LORazepam (ATIVAN) injection 1 mg (1 mg Intravenous Given 05/20/19 0010)     Initial Impression / Assessment and Plan / ED Course  I have reviewed the triage vital signs and the nursing notes.  Pertinent labs & imaging results that were available during my care of the patient were reviewed by me and considered in my medical decision making (see chart for  details).    Pt is feeling much better after the above treatment.  2 negative high sensitivity troponins, neg cxr, neg ddimer.    Pt presented like this back in May and it was a panic attack.  I spoke with her and she thinks that may be what this was.    Pt is stable for d/c.  Return if worse.   Final Clinical Impressions(s) / ED Diagnoses   Final diagnoses:  Atypical chest pain    ED Discharge Orders         Ordered    ondansetron (ZOFRAN ODT) 4 MG disintegrating tablet  Every 8 hours PRN     05/20/19 0056    LORazepam (ATIVAN) 1 MG tablet  3 times daily PRN     05/20/19 0056           Jacalyn LefevreHaviland, Javarie Crisp, MD 05/20/19 0101

## 2019-05-19 NOTE — ED Triage Notes (Signed)
Pt states she started having chest pain 3 days but that it got worse today and that she started becoming short of breath and vomited twice today.

## 2019-05-19 NOTE — ED Notes (Signed)
Pt states her cp is getting worse, pt restless and swaying in bed.  RA sats at 100% but offered and applied 2 L/M of O2 via [place]

## 2019-05-20 LAB — CBG MONITORING, ED: Glucose-Capillary: 421 mg/dL — ABNORMAL HIGH (ref 70–99)

## 2019-05-20 MED ORDER — KETOROLAC TROMETHAMINE 30 MG/ML IJ SOLN
INTRAMUSCULAR | Status: AC
  Filled 2019-05-20: qty 1

## 2019-05-20 MED ORDER — LORAZEPAM 1 MG PO TABS: 1.0000 mg | ORAL_TABLET | Freq: Three times a day (TID) | ORAL | 0 refills | Status: DC | PRN

## 2019-05-20 MED ORDER — ONDANSETRON 4 MG PO TBDP: 4.0000 mg | ORAL_TABLET | Freq: Three times a day (TID) | ORAL | 0 refills | Status: DC | PRN

## 2019-05-20 MED ORDER — KETOROLAC TROMETHAMINE 30 MG/ML IJ SOLN
30.0000 mg | Freq: Once | INTRAMUSCULAR | Status: AC
  Administered 2019-05-20: 30 mg via INTRAVENOUS

## 2019-05-21 ENCOUNTER — Observation Stay (HOSPITAL_COMMUNITY)
Admission: EM | Admit: 2019-05-21 | Discharge: 2019-05-22 | Disposition: A | Discharge status: Home / Self Care | Payer: Medicaid Other | Attending: Family Medicine | Admitting: Family Medicine

## 2019-05-21 ENCOUNTER — Emergency Department (HOSPITAL_COMMUNITY): Payer: Medicaid Other

## 2019-05-21 ENCOUNTER — Other Ambulatory Visit: Payer: Self-pay

## 2019-05-21 ENCOUNTER — Encounter (HOSPITAL_COMMUNITY): Payer: Self-pay | Admitting: Emergency Medicine

## 2019-05-21 DIAGNOSIS — E119 Type 2 diabetes mellitus without complications: Secondary | ICD-10-CM | POA: Insufficient documentation

## 2019-05-21 DIAGNOSIS — I10 Essential (primary) hypertension: Secondary | ICD-10-CM | POA: Diagnosis not present

## 2019-05-21 DIAGNOSIS — E1165 Type 2 diabetes mellitus with hyperglycemia: Principal | ICD-10-CM | POA: Insufficient documentation

## 2019-05-21 DIAGNOSIS — R739 Hyperglycemia, unspecified: Secondary | ICD-10-CM

## 2019-05-21 DIAGNOSIS — R1115 Cyclical vomiting syndrome unrelated to migraine: Secondary | ICD-10-CM | POA: Diagnosis present

## 2019-05-21 DIAGNOSIS — Z7984 Long term (current) use of oral hypoglycemic drugs: Secondary | ICD-10-CM | POA: Insufficient documentation

## 2019-05-21 DIAGNOSIS — R1013 Epigastric pain: Secondary | ICD-10-CM | POA: Diagnosis present

## 2019-05-21 DIAGNOSIS — I152 Hypertension secondary to endocrine disorders: Secondary | ICD-10-CM | POA: Diagnosis present

## 2019-05-21 DIAGNOSIS — R112 Nausea with vomiting, unspecified: Secondary | ICD-10-CM

## 2019-05-21 LAB — CBC WITH DIFFERENTIAL/PLATELET
Abs Immature Granulocytes: 0.15 10*3/uL — ABNORMAL HIGH (ref 0.00–0.07)
Basophils Absolute: 0 10*3/uL (ref 0.0–0.1)
Basophils Relative: 0 %
Eosinophils Absolute: 0 10*3/uL (ref 0.0–0.5)
Eosinophils Relative: 0 %
HCT: 37.1 % (ref 36.0–46.0)
Hemoglobin: 12.1 g/dL (ref 12.0–15.0)
Immature Granulocytes: 2 %
Lymphocytes Relative: 10 %
Lymphs Abs: 1 10*3/uL (ref 0.7–4.0)
MCH: 26.9 pg (ref 26.0–34.0)
MCHC: 32.6 g/dL (ref 30.0–36.0)
MCV: 82.4 fL (ref 80.0–100.0)
Monocytes Absolute: 0.5 10*3/uL (ref 0.1–1.0)
Monocytes Relative: 5 %
Neutro Abs: 7.9 10*3/uL — ABNORMAL HIGH (ref 1.7–7.7)
Neutrophils Relative %: 83 %
Platelets: 456 10*3/uL — ABNORMAL HIGH (ref 150–400)
RBC: 4.5 MIL/uL (ref 3.87–5.11)
RDW: 14.8 % (ref 11.5–15.5)
WBC: 9.5 10*3/uL (ref 4.0–10.5)
nRBC: 0 % (ref 0.0–0.2)

## 2019-05-21 LAB — COMPREHENSIVE METABOLIC PANEL
ALT: 18 U/L (ref 0–44)
AST: 10 U/L — ABNORMAL LOW (ref 15–41)
Albumin: 4.1 g/dL (ref 3.5–5.0)
Alkaline Phosphatase: 114 U/L (ref 38–126)
BUN: 18 mg/dL (ref 6–20)
CO2: 24 mmol/L (ref 22–32)
Calcium: 10 mg/dL (ref 8.9–10.3)
Chloride: 87 mmol/L — ABNORMAL LOW (ref 98–111)
Creatinine, Ser: 0.88 mg/dL (ref 0.44–1.00)
GFR calc Af Amer: >60 mL/min (ref >60)
GFR calc non Af Amer: >60 mL/min (ref >60)
Glucose, Bld: 544 mg/dL (ref 70–99)
Potassium: 3.6 mmol/L (ref 3.5–5.1)
Sodium: 132 mmol/L — ABNORMAL LOW (ref 135–145)
Total Bilirubin: 0.9 mg/dL (ref 0.3–1.2)
Total Protein: 9.3 g/dL — ABNORMAL HIGH (ref 6.5–8.1)

## 2019-05-21 LAB — URINALYSIS, ROUTINE W REFLEX MICROSCOPIC
Bilirubin Urine: NEGATIVE
Glucose, UA: >=500 mg/dL — AB
Hgb urine dipstick: NEGATIVE
Ketones, ur: 20 mg/dL — AB
Leukocytes,Ua: NEGATIVE
Nitrite: NEGATIVE
Protein, ur: NEGATIVE mg/dL
Specific Gravity, Urine: 1.039 — ABNORMAL HIGH (ref 1.005–1.030)
pH: 6 (ref 5.0–8.0)

## 2019-05-21 LAB — CBG MONITORING, ED
Glucose-Capillary: 397 mg/dL — ABNORMAL HIGH (ref 70–99)
Glucose-Capillary: 356 mg/dL — ABNORMAL HIGH (ref 70–99)
Glucose-Capillary: 344 mg/dL — ABNORMAL HIGH (ref 70–99)
Glucose-Capillary: 336 mg/dL — ABNORMAL HIGH (ref 70–99)

## 2019-05-21 LAB — LIPASE, BLOOD: Lipase: 33 U/L (ref 11–51)

## 2019-05-21 LAB — GLUCOSE, CAPILLARY: Glucose-Capillary: 304 mg/dL — ABNORMAL HIGH (ref 70–99)

## 2019-05-21 MED ORDER — METFORMIN HCL ER 500 MG PO TB24: 1000.0000 mg | ORAL_TABLET | Freq: Two times a day (BID) | ORAL | Status: DC

## 2019-05-21 MED ORDER — LORAZEPAM 1 MG PO TABS
1.0000 mg | ORAL_TABLET | Freq: Three times a day (TID) | ORAL | Status: DC | PRN
  Administered 2019-05-21 – 2019-05-22 (×3): 1 mg via ORAL
  Filled 2019-05-21 (×3): qty 1

## 2019-05-21 MED ORDER — TRAZODONE HCL 50 MG PO TABS: 50.0000 mg | ORAL_TABLET | Freq: Every evening | ORAL | Status: DC | PRN

## 2019-05-21 MED ORDER — FENTANYL CITRATE (PF) 100 MCG/2ML IJ SOLN
50.0000 ug | Freq: Once | INTRAMUSCULAR | Status: AC
  Administered 2019-05-21: 50 ug via INTRAVENOUS
  Filled 2019-05-21: qty 2

## 2019-05-21 MED ORDER — METOCLOPRAMIDE HCL 10 MG PO TABS: 10.0000 mg | ORAL_TABLET | Freq: Four times a day (QID) | ORAL | 0 refills | Status: DC | PRN

## 2019-05-21 MED ORDER — INSULIN ASPART 100 UNIT/ML ~~LOC~~ SOLN
0.0000 [IU] | Freq: Three times a day (TID) | SUBCUTANEOUS | Status: DC
  Administered 2019-05-21: 11 [IU] via SUBCUTANEOUS
  Filled 2019-05-21: qty 1

## 2019-05-21 MED ORDER — ONDANSETRON HCL 4 MG PO TABS: 4.0000 mg | ORAL_TABLET | Freq: Four times a day (QID) | ORAL | Status: DC | PRN

## 2019-05-21 MED ORDER — PROMETHAZINE HCL 25 MG/ML IJ SOLN
12.5000 mg | Freq: Once | INTRAMUSCULAR | Status: AC
  Administered 2019-05-21: 12.5 mg via INTRAVENOUS
  Filled 2019-05-21: qty 1

## 2019-05-21 MED ORDER — HYDRALAZINE HCL 20 MG/ML IJ SOLN
10.0000 mg | Freq: Four times a day (QID) | INTRAMUSCULAR | Status: DC | PRN
  Administered 2019-05-21: 10 mg via INTRAVENOUS
  Filled 2019-05-21: qty 1

## 2019-05-21 MED ORDER — PROMETHAZINE HCL 25 MG/ML IJ SOLN
12.5000 mg | Freq: Four times a day (QID) | INTRAMUSCULAR | Status: DC | PRN
  Administered 2019-05-21: 12.5 mg via INTRAVENOUS
  Filled 2019-05-21 (×2): qty 1

## 2019-05-21 MED ORDER — ACETAMINOPHEN 650 MG RE SUPP: 650.0000 mg | Freq: Four times a day (QID) | RECTAL | Status: DC | PRN

## 2019-05-21 MED ORDER — PROMETHAZINE HCL 25 MG RE SUPP: 25.0000 mg | Freq: Four times a day (QID) | RECTAL | 0 refills | Status: DC | PRN

## 2019-05-21 MED ORDER — SODIUM CHLORIDE 0.9 % IV BOLUS
500.0000 mL | Freq: Once | INTRAVENOUS | Status: AC
  Administered 2019-05-21: 500 mL via INTRAVENOUS

## 2019-05-21 MED ORDER — ONDANSETRON HCL 4 MG/2ML IJ SOLN: 4.0000 mg | Freq: Four times a day (QID) | INTRAMUSCULAR | Status: DC | PRN

## 2019-05-21 MED ORDER — HEPARIN SODIUM (PORCINE) 5000 UNIT/ML IJ SOLN
5000.0000 [IU] | Freq: Three times a day (TID) | INTRAMUSCULAR | Status: DC
  Filled 2019-05-21 (×2): qty 1

## 2019-05-21 MED ORDER — FAMOTIDINE IN NACL 20-0.9 MG/50ML-% IV SOLN
20.0000 mg | Freq: Two times a day (BID) | INTRAVENOUS | Status: DC
  Administered 2019-05-21 (×2): 20 mg via INTRAVENOUS
  Filled 2019-05-21 (×3): qty 50

## 2019-05-21 MED ORDER — METOCLOPRAMIDE HCL 5 MG/ML IJ SOLN
5.0000 mg | Freq: Four times a day (QID) | INTRAMUSCULAR | Status: DC
  Administered 2019-05-21 – 2019-05-22 (×2): 5 mg via INTRAVENOUS
  Filled 2019-05-21 (×3): qty 2

## 2019-05-21 MED ORDER — IOHEXOL 300 MG/ML  SOLN
100.0000 mL | Freq: Once | INTRAMUSCULAR | Status: AC | PRN
  Administered 2019-05-21: 100 mL via INTRAVENOUS

## 2019-05-21 MED ORDER — INSULIN ASPART 100 UNIT/ML ~~LOC~~ SOLN
0.0000 [IU] | Freq: Every day | SUBCUTANEOUS | Status: DC
  Administered 2019-05-21: 23:00:00 4 [IU] via SUBCUTANEOUS

## 2019-05-21 MED ORDER — SODIUM CHLORIDE 0.9 % IV SOLN: 250.0000 mL | INTRAVENOUS | Status: DC | PRN

## 2019-05-21 MED ORDER — LOSARTAN POTASSIUM 50 MG PO TABS
50.0000 mg | ORAL_TABLET | Freq: Every day | ORAL | Status: DC
  Filled 2019-05-21: qty 1

## 2019-05-21 MED ORDER — INSULIN ASPART 100 UNIT/ML ~~LOC~~ SOLN
10.0000 [IU] | Freq: Once | SUBCUTANEOUS | Status: AC
  Administered 2019-05-21: 10 [IU] via INTRAVENOUS
  Filled 2019-05-21: qty 1

## 2019-05-21 MED ORDER — FLUOXETINE HCL 10 MG PO CAPS
10.0000 mg | ORAL_CAPSULE | Freq: Every day | ORAL | Status: DC
  Administered 2019-05-22: 10 mg via ORAL
  Filled 2019-05-21: qty 1

## 2019-05-21 MED ORDER — TRAZODONE HCL 50 MG PO TABS
100.0000 mg | ORAL_TABLET | Freq: Every day | ORAL | Status: DC
  Administered 2019-05-21: 21:00:00 100 mg via ORAL
  Filled 2019-05-21: qty 2

## 2019-05-21 MED ORDER — POLYETHYLENE GLYCOL 3350 17 G PO PACK: 17.0000 g | PACK | Freq: Every day | ORAL | Status: DC | PRN

## 2019-05-21 MED ORDER — METOCLOPRAMIDE HCL 5 MG/ML IJ SOLN
5.0000 mg | Freq: Once | INTRAMUSCULAR | Status: AC
  Administered 2019-05-21: 5 mg via INTRAVENOUS
  Filled 2019-05-21: qty 2

## 2019-05-21 MED ORDER — INSULIN ASPART 100 UNIT/ML IV SOLN
5.0000 [IU] | Freq: Once | INTRAVENOUS | Status: AC
  Administered 2019-05-21: 5 [IU] via INTRAVENOUS

## 2019-05-21 MED ORDER — ACETAMINOPHEN 325 MG PO TABS: 650.0000 mg | ORAL_TABLET | Freq: Four times a day (QID) | ORAL | Status: DC | PRN

## 2019-05-21 MED ORDER — LACTATED RINGERS IV BOLUS
1000.0000 mL | Freq: Once | INTRAVENOUS | Status: AC
  Administered 2019-05-21: 1000 mL via INTRAVENOUS

## 2019-05-21 MED ORDER — SODIUM CHLORIDE 0.9 % IV SOLN
INTRAVENOUS | Status: DC
  Administered 2019-05-21 – 2019-05-22 (×2): via INTRAVENOUS

## 2019-05-21 MED ORDER — SODIUM CHLORIDE 0.9% FLUSH: 3.0000 mL | INTRAVENOUS | Status: DC | PRN

## 2019-05-21 MED ORDER — SODIUM CHLORIDE 0.9% FLUSH: 3.0000 mL | Freq: Two times a day (BID) | INTRAVENOUS | Status: DC

## 2019-05-21 MED ORDER — PROCHLORPERAZINE EDISYLATE 10 MG/2ML IJ SOLN
10.0000 mg | Freq: Once | INTRAMUSCULAR | Status: AC
  Administered 2019-05-21: 10 mg via INTRAVENOUS
  Filled 2019-05-21: qty 2

## 2019-05-21 MED ORDER — ALBUTEROL SULFATE (2.5 MG/3ML) 0.083% IN NEBU: 2.5000 mg | INHALATION_SOLUTION | RESPIRATORY_TRACT | Status: DC | PRN

## 2019-05-21 MED ORDER — PROMETHAZINE HCL 25 MG/ML IJ SOLN
12.5000 mg | Freq: Three times a day (TID) | INTRAMUSCULAR | Status: DC | PRN
  Administered 2019-05-21: 12.5 mg via INTRAVENOUS
  Filled 2019-05-21: qty 1

## 2019-05-21 MED ORDER — INSULIN GLARGINE 100 UNIT/ML ~~LOC~~ SOLN
10.0000 [IU] | Freq: Once | SUBCUTANEOUS | Status: AC
  Administered 2019-05-21: 10 [IU] via SUBCUTANEOUS
  Filled 2019-05-21: qty 0.1

## 2019-05-21 MED ORDER — MORPHINE SULFATE (PF) 2 MG/ML IV SOLN
2.0000 mg | Freq: Once | INTRAVENOUS | Status: AC
  Administered 2019-05-21: 2 mg via INTRAVENOUS
  Filled 2019-05-21: qty 1

## 2019-05-21 MED ORDER — SODIUM CHLORIDE 0.9 % IV BOLUS
1000.0000 mL | Freq: Once | INTRAVENOUS | Status: AC
  Administered 2019-05-21: 1000 mL via INTRAVENOUS

## 2019-05-21 NOTE — ED Notes (Signed)
Date and time results received: 05/21/19 0500 (use smartphrase ".now" to insert current time)  Test: glucose Critical Value: 544  Name of Provider Notified: Dr Dayna Barker  Orders Received? Or Actions Taken?: Actions Taken: no orders received

## 2019-05-21 NOTE — ED Provider Notes (Signed)
Patient care signed out to evaluate repeat glucose and assess for vomiting.  Patient sugar improved to 356.  On reexamination patient still actively vomiting generally feels unwell.  This is patient's second visit for similar.  Concern for gastritis/diabetic gastroparesis.  Repeat IV fluids, IV insulin, nausea meds ordered.  Patient has been treated with supportive care in the ER from a 7 hours and with second visit and worsening symptoms feel this is indication for observation/admission to the hospital for further diabetic management, IV fluids antiemetics and possible further evaluation. BP also elevated in the ED.  Hospitalist paged for admission, accepted.    Golda Acre, MD 05/21/19 1140

## 2019-05-21 NOTE — Progress Notes (Signed)
Inpatient Diabetes Program Recommendations  AACE/ADA: New Consensus Statement on Inpatient Glycemic Control   Target Ranges:  Prepandial:   less than 140 mg/dL      Peak postprandial:   less than 180 mg/dL (1-2 hours)      Critically ill patients:  140 - 180 mg/dL   Results for CATALEA, LABRECQUE (MRN [id number]) as of 05/21/2019 14:19  Ref. Range 05/20/2019 00:09 05/21/2019 06:32  Glucose-Capillary Latest Ref Range: 70 - 99 mg/dL 421 (H) 397 (H)   Review of Glycemic Control  Diabetes history: DM2 Outpatient Diabetes medications: Metformin 1000 mg BID, per discharge summary on 04/15/19 patient is also prescribed Lantus 45 units BID Current orders for Inpatient glycemic control: Novolog 0-15 units TID with meals, Novolog 0-5 units QHS  Inpatient Diabetes Program Recommendations:  Insulin - Basal: During last hospitalization 04/11/19 to 04/15/19 patient was ordered Lantus 20 units BID. May want to consider ordering Lantus 20 units BID.  HgbA1C: A1C 9% on 04/13/19 indicating an average glucose of 212 mg/dl.  Thanks, Barnie Alderman, RN, MSN, CDE Diabetes Coordinator Inpatient Diabetes Program [phone] (Team Pager from 8am to 5pm)

## 2019-05-21 NOTE — ED Provider Notes (Signed)
Emergency Department Provider Note   I have reviewed the triage vital signs and the nursing notes.   HISTORY  Chief Complaint Emesis   HPI Theresa Malone is a 38 y.o. female who was seen yesterday for similar symptoms of epigastric pain and vomiting.  Patient states that she has not been able to hold anything down since that time.  Patient states that he has a persistent epigastric pain.  Her blood sugars have been running higher than previously.  She states last time she had anything like this is when she is on doxycycline.  She is not on any new medications at this time.   No fevers, diarrhea or constipation.  No urinary symptoms.  No other associated or modifying symptoms.    Past Medical History:  Diagnosis Date   Anxiety    panic attacks   Depression    Diabetes mellitus    Hypertension    Obesity     Patient Active Problem List   Diagnosis Date Noted   Cellulitis    Intractable vomiting with nausea 04/13/2019   Acute respiratory alkalosis    Intractable vomiting    Chest pain    Cellulitis of right lower extremity 04/12/2019   Intractable abdominal pain 28/41/3244   Metabolic alkalosis 11/28/7251   Respiratory alkalosis 04/11/2019   Volume depletion 04/11/2019   Hypokalemia 04/11/2019   Hyponatremia 04/11/2019   Yeast vaginitis 03/13/2018   Type 2 diabetes mellitus, uncontrolled 02/07/2016   Chronic hypertension 02/07/2016   Menorrhagia with regular cycle 02/07/2016   Frequent loose stools 02/07/2016   Status post cesarean section 01/25/2015   Hidradenitis axillaris 09/15/2014   Susceptible to varicella (non-immune), currently pregnant 07/24/2014   Morbid obesity with BMI of 45.0-49.9, adult ([place]) 03/03/2014    Past Surgical History:  Procedure Laterality Date   CESAREAN SECTION     C/S x 2   CESAREAN SECTION N/A 01/24/2015   Procedure: CESAREAN SECTION;  Surgeon: Mora Bellman, MD;  Location: [facility];  Service:  Obstetrics;  Laterality: N/A;   TUBAL LIGATION      Current Outpatient Rx   Order #: [id number] Class: Normal   Order #: [id number] Class: Normal   Order #: [id number] Class: Normal   Order #: [id number] Class: Historical Med   Order #: [id number] Class: Historical Med   Order #: [id number] Class: Normal   Order #: [id number] Class: Normal   Order #: [id number] Class: Historical Med    Allergies Lisinopril  Family History  Problem Relation Age of Onset   Diabetes Paternal Grandfather    Cancer Paternal Grandfather        liver   Cancer Paternal Grandmother        liver   Hypertension Father    Diabetes Mother    Hypertension Mother    Diabetes Maternal Uncle    Diabetes Maternal Grandmother    Cancer Maternal Grandfather    Diabetes Daughter        boarderline    Asthma Daughter    Bronchitis Daughter    Bronchitis Son    Bronchitis Daughter     Social History Social History   Tobacco Use   Smoking status: Never Smoker   Smokeless tobacco: Never Used  Substance Use Topics   Alcohol use: Yes    Comment: wine occ   Drug use: No    Review of Systems  All other systems negative except as documented in the HPI. All pertinent positives and negatives as reviewed in the HPI. ____________________________________________   PHYSICAL EXAM:  VITAL SIGNS: ED Triage Vitals  Enc Vitals Group     BP 05/21/19 0314 (!) 175/120     Pulse Rate 05/21/19 0314 (!) 108     Resp 05/21/19 0314 18     Temp 05/21/19 0314 97.7 F (36.5 C)     Temp src --      SpO2 05/21/19 0314 100 %     Weight 05/21/19 0316 220 lb 14.4 oz (100.2 kg)    Constitutional: Alert and oriented. Well appearing and in no acute distress. Eyes: Conjunctivae are normal. PERRL. EOMI. Head: Atraumatic. Nose: No congestion/rhinnorhea. Mouth/Throat: Mucous membranes are moist.  Oropharynx non-erythematous. Neck: No stridor.  No meningeal signs.   Cardiovascular: Normal rate, regular  rhythm. Good peripheral circulation. Grossly normal heart sounds.   Respiratory: Normal respiratory effort.  No retractions. Lungs CTAB. Gastrointestinal: Soft and nontender. No distention.  Musculoskeletal: No lower extremity tenderness nor edema. No gross deformities of extremities. Neurologic:  Normal speech and language. No gross focal neurologic deficits are appreciated.  Skin:  Skin is warm, dry and intact. No rash noted.  ____________________________________________   LABS (all labs ordered are listed, but only abnormal results are displayed)  Labs Reviewed  CBC WITH DIFFERENTIAL/PLATELET - Abnormal; Notable for the following components:      Result Value   Platelets 456 (*)    Neutro Abs 7.9 (*)    Abs Immature Granulocytes 0.15 (*)    All other components within normal limits  COMPREHENSIVE METABOLIC PANEL - Abnormal; Notable for the following components:   Sodium 132 (*)    Chloride 87 (*)    Glucose, Bld 544 (*)    Total Protein 9.3 (*)    AST 10 (*)    All other components within normal limits  LIPASE, BLOOD  URINALYSIS, ROUTINE W REFLEX MICROSCOPIC   ____________________________________________  EKG   EKG Interpretation  Date/Time:    Ventricular Rate:    PR Interval:    QRS Duration:   QT Interval:    QTC Calculation:   R Axis:     Text Interpretation:         ____________________________________________  RADIOLOGY  No results found.  ____________________________________________   PROCEDURES  Procedure(s) performed:   Procedures   ____________________________________________   INITIAL IMPRESSION / ASSESSMENT AND PLAN / ED COURSE For DKA versus pancreatitis versus gallbladder pathology.  Will treat symptomatically this time.  Could also just be diabetic gastroparesis so will use Phenergan to see if that helps better than Zofran.  Found to be hyperglycemic will give some fluids and some insulin and will need to recheck it.  We will get  a CT scan to ensure no occult infection.  CT okay.  Blood sugar slightly improved however patient still feels nauseous.  We are subcu insulin along with some more fluids and another dose of antiemetics however patient does appear better and states that she also feels better so she will likely be able to be discharged.  Care transferred pending meds/fluids for recheck and likely discharge.     Pertinent labs & imaging results that were available during my care of the patient were reviewed by me and considered in my medical decision making (see chart for details).   ____________________________________________  FINAL CLINICAL IMPRESSION(S) / ED DIAGNOSES  Final diagnoses:  None     MEDICATIONS GIVEN DURING THIS VISIT:  Medications  lactated ringers bolus 1,000 mL (0 mLs Intravenous Stopped 05/21/19 0515)  promethazine (PHENERGAN) injection 12.5 mg (12.5 mg Intravenous  Given 05/21/19 0400)  fentaNYL (SUBLIMAZE) injection 50 mcg (50 mcg Intravenous Given 05/21/19 0400)  lactated ringers bolus 1,000 mL (1,000 mLs Intravenous New Bag/Given 05/21/19 0515)  insulin aspart (novoLOG) injection 10 Units (10 Units Intravenous Given 05/21/19 0511)  iohexol (OMNIPAQUE) 300 MG/ML solution 100 mL (100 mLs Intravenous Contrast Given 05/21/19 0529)     NEW OUTPATIENT MEDICATIONS STARTED DURING THIS VISIT:  New Prescriptions   No medications on file    Note:  This note was prepared with assistance of Dragon voice recognition software. Occasional wrong-word or sound-a-like substitutions may have occurred due to the inherent limitations of voice recognition software.   Annlouise Gerety, Barbara CowerJason, MD 05/22/19 [phone]

## 2019-05-21 NOTE — ED Triage Notes (Signed)
Pt c/o emesis since being discharged yesterday. She also c/o upper abd pain, chest pain and throat pain.

## 2019-05-21 NOTE — ED Notes (Signed)
BGL rechecked - 356

## 2019-05-21 NOTE — H&P (Signed)
Patient Demographics:    Theresa Malone, is a 38 y.o. female  MRN: [id number]   DOB - [birth year]  Admit Date - 05/21/2019  Outpatient Primary MD for the patient is Llamas, Alvester MorinJewel C, MD   Assessment & Plan:    Principal Problem:   Emesis, persistent Active Problems:   Type 2 diabetes mellitus, uncontrolled   Chronic hypertension    1) intractable emesis--- CT abdomen and pelvis without acute findings, lipase is not elevated, no leukocytosis and LFTs not elevated.Marland Kitchen. UA does not suggest UTI--- continues with emesis despite antiemetics in the ED... Place in observation status for IV fluids and IV antiemetics... May be related to gastroparesis patient has been diabetic since 2007.Marland Kitchen. Patient for GI prophylaxis as ordered given recurrent intractable emesis  2) uncontrolled DM 2-- A1c  9.0 on 04/13/2019, hold metformin in the setting of recurrent emesis, use sliding scale insulin for now  3)HTN--- okay to restart losartan,,  may use IV Hydralazine 10 mg  Every 4 hours Prn for systolic blood pressure over 170 mmhg  4) anxiety disorder----patient is somewhat anxious at this time, restart Prozac and lorazepam, give trazodone nightly   With History of - Reviewed by me  Past Medical History:  Diagnosis Date   Anxiety    panic attacks   Depression    Diabetes mellitus    Hypertension    Obesity       Past Surgical History:  Procedure Laterality Date   CESAREAN SECTION     C/S x 2   CESAREAN SECTION N/A 01/24/2015   Procedure: CESAREAN SECTION;  Surgeon: Catalina AntiguaPeggy Constant, MD;  Location: [facility];  Service: Obstetrics;  Laterality: N/A;   TUBAL LIGATION      Chief Complaint  Patient presents with   Emesis      HPI:    Theresa AmourKelly Malone  is a 38 y.o. female with past medical history relevant for morbid obesity,  DM 2, hypertension and anxiety disorder who presented to the ED initially on 05/19/2019 with abdominal discomfort nausea vomiting----.. work-up was unremarkable at the time  Patient return to the ED on 05/21/2019 with same symptoms----lipase not elevated, WBC not elevated, she is afebrile, CT abdomen and pelvis without acute findings,  She was noted to have elevated blood sugars over 500--- does not meet criteria for DKA  Despite IV fluids and antiemetics patient continued to have emesis consult was requested for further management and observation   Patient lives with 4 kids and her husband, no sick contacts at home    Review of systems:    In addition to the HPI above,   A full Review of  Systems was done, all other systems reviewed are negative except as noted above in HPI , .    Social History:  Reviewed by me    Social History   Tobacco Use   Smoking status: Never Smoker   Smokeless tobacco: Never Used  Substance Use  Topics   Alcohol use: Yes    Comment: wine occ       Family History :  Reviewed by me    Family History  Problem Relation Age of Onset   Diabetes Paternal Grandfather    Cancer Paternal Grandfather        liver   Cancer Paternal Grandmother        liver   Hypertension Father    Diabetes Mother    Hypertension Mother    Diabetes Maternal Uncle    Diabetes Maternal Grandmother    Cancer Maternal Grandfather    Diabetes Daughter        boarderline    Asthma Daughter    Bronchitis Daughter    Bronchitis Son    Bronchitis Daughter     Home Medications:   Prior to Admission medications   Medication Sig Start Date End Date Taking? Authorizing Provider  losartan (COZAAR) 50 MG tablet Take 50 mg by mouth daily.   Yes [provider]  metFORMIN (GLUMETZA) 1000 MG (MOD) 24 hr tablet Take 1 tablet by mouth 2 (two) times daily.   Yes [provider]  FLUoxetine (PROZAC) 10 MG capsule Take 1 capsule (10 mg total) by  mouth daily. 04/16/19   Orson Eva, MD  LORazepam (ATIVAN) 1 MG tablet Take 1 tablet (1 mg total) by mouth 3 (three) times daily as needed for anxiety. 05/20/19   Isla Pence, MD  metoCLOPramide (REGLAN) 10 MG tablet Take 1 tablet (10 mg total) by mouth every 6 (six) hours as needed for nausea (nausea/headache). 05/21/19   Mesner, Corene Cornea, MD  ondansetron (ZOFRAN ODT) 4 MG disintegrating tablet Take 1 tablet (4 mg total) by mouth every 8 (eight) hours as needed. Patient not taking: Reported on 05/21/2019 05/20/19   Isla Pence, MD  promethazine (PHENERGAN) 25 MG suppository Place 1 suppository (25 mg total) rectally every 6 (six) hours as needed for nausea or vomiting. 05/21/19   Mesner, Corene Cornea, MD  clonazepam (KLONOPIN) 0.125 MG disintegrating tablet Take 1 tablet (0.125 mg total) by mouth 2 (two) times daily. Patient not taking: Reported on 05/19/2019 04/15/19 05/21/19  Orson Eva, MD     Allergies:     Allergies  Allergen Reactions   Lisinopril Cough    cough     Physical Exam:   Vitals  Blood pressure (!) 142/83, pulse (!) 117, temperature 97.7 F (36.5 C), resp. rate 18, weight 100.2 kg, last menstrual period 05/11/2019, SpO2 100 %.  Physical Examination: General appearance - alert, retching and vomiting during my exam  mental status - alert, oriented to person, place, and time,  Eyes - sclera anicteric Neck - supple, no JVD elevation , Chest - clear  to auscultation bilaterally, symmetrical air movement,  Heart - S1 and S2 normal, regular , tachycardic Abdomen - soft, nontender, nondistended, increased truncal adiposity , no CVA area tenderness  neurological -anxious, somewhat restless, neck supple without rigidity, cranial nerves II through XII intact, DTR's normal and symmetric Extremities - no pedal edema noted, intact peripheral pulses  Skin - warm, dry     Data Review:    CBC Recent Labs  Lab 05/19/19 2121 05/21/19 0405  WBC 9.7 9.5  HGB 11.7* 12.1  HCT 34.9*  37.1  PLT 393 456*  MCV 80.6 82.4  MCH 27.0 26.9  MCHC 33.5 32.6  RDW 14.6 14.8  LYMPHSABS  --  1.0  MONOABS  --  0.5  EOSABS  --  0.0  BASOSABS  --  0.0   ------------------------------------------------------------------------------------------------------------------  Chemistries  Recent Labs  Lab 05/19/19 2121 05/21/19 0405  NA 130* 132*  K 3.9 3.6  CL 94* 87*  CO2 23 24  GLUCOSE 348* 544*  BUN 12 18  CREATININE 0.82 0.88  CALCIUM 9.7 10.0  AST 16 10*  ALT 19 18  ALKPHOS 100 114  BILITOT 0.8 0.9   ------------------------------------------------------------------------------------------------------------------ estimated creatinine clearance is 96.9 mL/min (by C-G formula based on SCr of 0.88 mg/dL). ------------------------------------------------------------------------------------------------------------------ No results for input(s): TSH, T4TOTAL, T3FREE, THYROIDAB in the last 72 hours.  Invalid input(s): FREET3   Coagulation profile No results for input(s): INR, PROTIME in the last 168 hours. ------------------------------------------------------------------------------------------------------------------- Recent Labs    05/19/19 2121  DDIMER 0.31   -------------------------------------------------------------------------------------------------------------------  Cardiac Enzymes No results for input(s): CKMB, TROPONINI, MYOGLOBIN in the last 168 hours.  Invalid input(s): CK ------------------------------------------------------------------------------------------------------------------ No results found for: BNP   ---------------------------------------------------------------------------------------------------------------  Urinalysis    Component Value Date/Time   COLORURINE STRAW (A) 05/21/2019 0338   APPEARANCEUR CLEAR 05/21/2019 0338   LABSPEC 1.039 (H) 05/21/2019 0338   PHURINE 6.0 05/21/2019 0338   GLUCOSEU >=500 (A) 05/21/2019 0338    HGBUR NEGATIVE 05/21/2019 0338   BILIRUBINUR NEGATIVE 05/21/2019 0338   KETONESUR 20 (A) 05/21/2019 0338   PROTEINUR NEGATIVE 05/21/2019 0338   UROBILINOGEN 0.2 02/05/2015 2125   NITRITE NEGATIVE 05/21/2019 0338   LEUKOCYTESUR NEGATIVE 05/21/2019 0338    ----------------------------------------------------------------------------------------------------------------   Imaging Results:    Ct Abdomen Pelvis W Contrast  Result Date: 05/21/2019 CLINICAL DATA:  Emesis since being discharged. EXAM: CT ABDOMEN AND PELVIS WITH CONTRAST TECHNIQUE: Multidetector CT imaging of the abdomen and pelvis was performed using the standard protocol following bolus administration of intravenous contrast. CONTRAST:  100mL OMNIPAQUE IOHEXOL 300 MG/ML  SOLN COMPARISON:  None. FINDINGS: Lower chest:  No contributory findings. Hepatobiliary: Hepatic steatosis. Patchy low-density in the ventral liver is likely areas of even greater steatotic infiltration. No discrete masslike area. No evidence of biliary obstruction or stone. Pancreas: Unremarkable. Spleen: Unremarkable. Adrenals/Urinary Tract: Negative adrenals. No hydronephrosis or stone. Full urinary bladder without superimposed abnormality. Stomach/Bowel:  No obstruction. No appendicitis. Vascular/Lymphatic: No acute vascular abnormality. Minimal aortic calcification. No mass or adenopathy. Reproductive:No pathologic findings. Other: No ascites or pneumoperitoneum. Subcutaneous nodule in the right anterior abdominal wall with fat fluid level, likely fat necrosis/oil cyst. Musculoskeletal: No acute abnormalities. IMPRESSION: 1. Negative for bowel obstruction or visible inflammation. 2. Hepatic steatosis. 3. Full urinary bladder. Electronically Signed   By: Marnee SpringJonathon  Watts M.D.   On: 05/21/2019 05:48   Dg Chest Port 1 View  Result Date: 05/19/2019 CLINICAL DATA:  Chest pain x3 days. EXAM: PORTABLE CHEST 1 VIEW COMPARISON:  None. FINDINGS: The heart size and mediastinal  contours are within normal limits. Both lungs are clear. The visualized skeletal structures are unremarkable. IMPRESSION: No active disease. Electronically Signed   By: Katherine Mantlehristopher  Green M.D.   On: 05/19/2019 22:14    Radiological Exams on Admission: Ct Abdomen Pelvis W Contrast  Result Date: 05/21/2019 CLINICAL DATA:  Emesis since being discharged. EXAM: CT ABDOMEN AND PELVIS WITH CONTRAST TECHNIQUE: Multidetector CT imaging of the abdomen and pelvis was performed using the standard protocol following bolus administration of intravenous contrast. CONTRAST:  100mL OMNIPAQUE IOHEXOL 300 MG/ML  SOLN COMPARISON:  None. FINDINGS: Lower chest:  No contributory findings. Hepatobiliary: Hepatic steatosis. Patchy low-density in the ventral liver is likely areas of even greater steatotic infiltration. No discrete masslike area. No evidence of biliary obstruction or  stone. Pancreas: Unremarkable. Spleen: Unremarkable. Adrenals/Urinary Tract: Negative adrenals. No hydronephrosis or stone. Full urinary bladder without superimposed abnormality. Stomach/Bowel:  No obstruction. No appendicitis. Vascular/Lymphatic: No acute vascular abnormality. Minimal aortic calcification. No mass or adenopathy. Reproductive:No pathologic findings. Other: No ascites or pneumoperitoneum. Subcutaneous nodule in the right anterior abdominal wall with fat fluid level, likely fat necrosis/oil cyst. Musculoskeletal: No acute abnormalities. IMPRESSION: 1. Negative for bowel obstruction or visible inflammation. 2. Hepatic steatosis. 3. Full urinary bladder. Electronically Signed   By: Marnee SpringJonathon  Watts M.D.   On: 05/21/2019 05:48   Dg Chest Port 1 View  Result Date: 05/19/2019 CLINICAL DATA:  Chest pain x3 days. EXAM: PORTABLE CHEST 1 VIEW COMPARISON:  None. FINDINGS: The heart size and mediastinal contours are within normal limits. Both lungs are clear. The visualized skeletal structures are unremarkable. IMPRESSION: No active disease.  Electronically Signed   By: Katherine Mantlehristopher  Green M.D.   On: 05/19/2019 22:14    DVT Prophylaxis -SCD/Heparin AM Labs Ordered, also please review Full Orders  Family Communication: Admission, patients condition and plan of care including tests being ordered have been discussed with the patient who indicate understanding and agree with the plan   Code Status - Full Code  Likely DC to  Home   Condition   stable  Shon Haleourage Marlynn Hinckley M.D on 05/21/2019 at 5:33 PM Go to [email or web address] -  for contact info  [facility] - Office  [phone]

## 2019-05-22 LAB — BASIC METABOLIC PANEL
Anion gap: 13 (ref 5–15)
BUN: 13 mg/dL (ref 6–20)
CO2: 24 mmol/L (ref 22–32)
Calcium: 8.6 mg/dL — ABNORMAL LOW (ref 8.9–10.3)
Chloride: 98 mmol/L (ref 98–111)
Creatinine, Ser: 0.69 mg/dL (ref 0.44–1.00)
GFR calc Af Amer: >60 mL/min (ref >60)
GFR calc non Af Amer: >60 mL/min (ref >60)
Glucose, Bld: 323 mg/dL — ABNORMAL HIGH (ref 70–99)
Potassium: 3.4 mmol/L — ABNORMAL LOW (ref 3.5–5.1)
Sodium: 135 mmol/L (ref 135–145)

## 2019-05-22 LAB — GLUCOSE, CAPILLARY
Glucose-Capillary: 303 mg/dL — ABNORMAL HIGH (ref 70–99)
Glucose-Capillary: 309 mg/dL — ABNORMAL HIGH (ref 70–99)

## 2019-05-22 MED ORDER — ONDANSETRON 4 MG PO TBDP: 4.0000 mg | ORAL_TABLET | Freq: Three times a day (TID) | ORAL | 1 refills | Status: DC | PRN

## 2019-05-22 MED ORDER — ACETAMINOPHEN 325 MG PO TABS: 650.0000 mg | ORAL_TABLET | Freq: Four times a day (QID) | ORAL | 0 refills | Status: DC | PRN

## 2019-05-22 MED ORDER — LOSARTAN POTASSIUM 50 MG PO TABS: 50.0000 mg | ORAL_TABLET | Freq: Every day | ORAL | 5 refills | Status: DC

## 2019-05-22 MED ORDER — ONDANSETRON 4 MG PO TBDP: 4.0000 mg | ORAL_TABLET | Freq: Once | ORAL | Status: DC

## 2019-05-22 MED ORDER — POLYETHYLENE GLYCOL 3350 17 G PO PACK: 17.0000 g | PACK | Freq: Every day | ORAL | 0 refills | Status: DC | PRN

## 2019-05-22 MED ORDER — METFORMIN HCL ER (MOD) 1000 MG PO TB24: 1000.0000 mg | ORAL_TABLET | Freq: Two times a day (BID) | ORAL | 5 refills | Status: DC

## 2019-05-22 MED ORDER — AMLODIPINE BESYLATE 5 MG PO TABS: 5.0000 mg | ORAL_TABLET | Freq: Every day | ORAL | 2 refills | Status: DC

## 2019-05-22 MED ORDER — AMLODIPINE BESYLATE 5 MG PO TABS
10.0000 mg | ORAL_TABLET | Freq: Once | ORAL | Status: AC
  Administered 2019-05-22: 10 mg via ORAL
  Filled 2019-05-22: qty 2

## 2019-05-22 MED ORDER — TRAZODONE HCL 100 MG PO TABS: 100.0000 mg | ORAL_TABLET | Freq: Every evening | ORAL | 1 refills | Status: DC | PRN

## 2019-05-22 MED ORDER — METFORMIN HCL 1000 MG PO TABS: 1000.0000 mg | ORAL_TABLET | Freq: Two times a day (BID) | ORAL | 11 refills | Status: DC

## 2019-05-22 MED ORDER — PANTOPRAZOLE SODIUM 40 MG PO TBEC: 40.0000 mg | DELAYED_RELEASE_TABLET | Freq: Every day | ORAL | 1 refills | Status: DC

## 2019-05-22 MED ORDER — POTASSIUM CHLORIDE CRYS ER 20 MEQ PO TBCR
40.0000 meq | EXTENDED_RELEASE_TABLET | Freq: Once | ORAL | Status: DC
  Filled 2019-05-22: qty 2

## 2019-05-22 NOTE — Discharge Summary (Signed)
Theresa Malone, is a 38 y.o. female  DOB 03-15-1981  MRN [id number].  Admission date:  05/21/2019  Admitting Physician  Shon Haleourage Kauri Garson, MD  Discharge Date:  05/23/2019   Primary MD  Jannet AskewLlamas, Jewel C, MD  Recommendations for primary care physician for things to follow:   1)Avoid ibuprofen/Advil/Aleve/Motrin/Goody Powders/Naproxen/BC powders/Meloxicam/Diclofenac/Indomethacin and other Nonsteroidal anti-inflammatory medications as these will make you more likely to bleed and can cause stomach ulcers, can also cause Kidney problems.   2) please follow-up with your primary care physician to improve your diabetic control--- your diabetes currently is not controlled .....Marland Kitchen. uncontrolled diabetes would make it difficult to manage your gastroparesis  3) please take your Zofran as prescribed as needed for nausea and vomiting  Follow-up with your primary care physician as advised above   Admission Diagnosis  Hyperglycemia [R73.9] Non-intractable vomiting with nausea, unspecified vomiting type [R11.2]   Discharge Diagnosis  Hyperglycemia [R73.9] Non-intractable vomiting with nausea, unspecified vomiting type [R11.2]    Principal Problem:   Emesis, persistent Active Problems:   Type 2 diabetes mellitus, uncontrolled   Chronic hypertension      Past Medical History:  Diagnosis Date  . Anxiety    panic attacks  . Depression   . Diabetes mellitus   . Hypertension   . Obesity     Past Surgical History:  Procedure Laterality Date  . CESAREAN SECTION     C/S x 2  . CESAREAN SECTION N/A 01/24/2015   Procedure: CESAREAN SECTION;  Surgeon: Catalina AntiguaPeggy Constant, MD;  Location: [facility];  Service: Obstetrics;  Laterality: N/A;  . TUBAL LIGATION         HPI  from the history and physical done on the day of admission:       Theresa AmourKelly Malone  is a 38 y.o. female with past medical history relevant for morbid obesity,  DM 2, hypertension and anxiety disorder who presented to the ED initially on 05/19/2019 with abdominal discomfort nausea vomiting----.. work-up was unremarkable at the time  Patient return to the ED on 05/21/2019 with same symptoms----lipase not elevated, WBC not elevated, she is afebrile, CT abdomen and pelvis without acute findings,  She was noted to have elevated blood sugars over 500--- does not meet criteria for DKA  Despite IV fluids and antiemetics patient continued to have emesis consult was requested for further management and observation   Patient lives with 4 kids and her husband, no sick contacts at home   Hospital Course:    1) intractable emesis--- CT abdomen and pelvis without acute findings, lipase is not elevated, no leukocytosis and LFTs not elevated.Marland Kitchen. UA does not suggest UTI---  . May be related to gastroparesis patient has been diabetic since 2007..  Improved with antiemetics  2) uncontrolled DM 2-- A1c  9.0 on 04/13/2019,  restart metformin in the setting of recurrent emesis, continue insulin regimen follow-up with PCP for adjustment  3)HTN---  restart losartan,,   amlodipine 5 mg daily added for better BP control  4) anxiety disorder---  restart Prozac and lorazepam,    Discharge Condition: Stable,  Follow UP  Follow-up Information    Schedule an appointment as soon as possible for a visit with Jannet AskewLlamas, Jewel C, MD.   Specialty: Family Medicine Contact information: [address] [place] [address] [phone]        [facility].   Specialty: Emergency Medicine Why: If symptoms worsen Contact information: [address] [id number] [place] [id number] [phone]          Diet and Activity recommendation:  As advised  Discharge Instructions    Discharge Instructions    Call MD for:  difficulty breathing, headache or visual disturbances   Complete by: As directed    Call MD for:  persistant  dizziness or light-headedness   Complete by: As directed    Call MD for:  persistant nausea and vomiting   Complete by: As directed    Call MD for:  temperature >100.4   Complete by: As directed    Diet - low sodium heart healthy   Complete by: As directed    Diet Carb Modified   Complete by: As directed    Discharge instructions   Complete by: As directed    1)Avoid ibuprofen/Advil/Aleve/Motrin/Goody Powders/Naproxen/BC powders/Meloxicam/Diclofenac/Indomethacin and other Nonsteroidal anti-inflammatory medications as these will make you more likely to bleed and can cause stomach ulcers, can also cause Kidney problems.   2) please follow-up with your primary care physician to improve your diabetic control--- your diabetes currently is not controlled .....Marland Kitchen. uncontrolled diabetes would make it difficult to manage your gastroparesis  3) please take your Zofran as prescribed as needed for nausea and vomiting  Follow-up with your primary care physician as advised above   Increase activity slowly   Complete by: As directed         Discharge Medications     Allergies as of 05/22/2019      Reactions   Lisinopril Cough   cough      Medication List    STOP taking these medications   clonazepam 0.125 MG disintegrating tablet Commonly known as: KLONOPIN   metFORMIN 1000 MG (MOD) 24 hr tablet Commonly known as: GLUMETZA Replaced by: metFORMIN 1000 MG tablet     TAKE these medications   acetaminophen 325 MG tablet Commonly known as: TYLENOL Take 2 tablets (650 mg total) by mouth every 6 (six) hours as needed for mild pain, fever or headache (or Fever >/= 101).   amLODipine 5 MG tablet Commonly known as: NORVASC Take 1 tablet (5 mg total) by mouth daily.   FLUoxetine 10 MG capsule Commonly known as: PROZAC Take 1 capsule (10 mg total) by mouth daily.   LORazepam 1 MG tablet Commonly known as: Ativan Take 1 tablet (1 mg total) by mouth 3 (three) times daily as needed for  anxiety.   losartan 50 MG tablet Commonly known as: COZAAR Take 1 tablet (50 mg total) by mouth daily.   metFORMIN 1000 MG tablet Commonly known as: Glucophage Take 1 tablet (1,000 mg total) by mouth 2 (two) times daily with a meal. Replaces: metFORMIN 1000 MG (MOD) 24 hr tablet   metoCLOPramide 10 MG tablet Commonly known as: Reglan Take 1 tablet (10 mg total) by mouth every 6 (six) hours as needed for nausea (nausea/headache). What changed:   medication strength  how much to take  when to take this  reasons to take this   ondansetron 4 MG disintegrating tablet Commonly known  as: Zofran ODT Take 1 tablet (4 mg total) by mouth every 8 (eight) hours as needed for nausea or vomiting. What changed: reasons to take this   pantoprazole 40 MG tablet Commonly known as: Protonix Take 1 tablet (40 mg total) by mouth daily.   polyethylene glycol 17 g packet Commonly known as: MIRALAX / GLYCOLAX Take 17 g by mouth daily as needed for mild constipation.   traZODone 100 MG tablet Commonly known as: DESYREL Take 1 tablet (100 mg total) by mouth at bedtime as needed for sleep.       Major procedures and Radiology Reports - PLEASE review detailed and final reports for all details, in brief -   Ct Abdomen Pelvis W Contrast  Result Date: 05/21/2019 CLINICAL DATA:  Emesis since being discharged. EXAM: CT ABDOMEN AND PELVIS WITH CONTRAST TECHNIQUE: Multidetector CT imaging of the abdomen and pelvis was performed using the standard protocol following bolus administration of intravenous contrast. CONTRAST:  100mL OMNIPAQUE IOHEXOL 300 MG/ML  SOLN COMPARISON:  None. FINDINGS: Lower chest:  No contributory findings. Hepatobiliary: Hepatic steatosis. Patchy low-density in the ventral liver is likely areas of even greater steatotic infiltration. No discrete masslike area. No evidence of biliary obstruction or stone. Pancreas: Unremarkable. Spleen: Unremarkable. Adrenals/Urinary Tract: Negative  adrenals. No hydronephrosis or stone. Full urinary bladder without superimposed abnormality. Stomach/Bowel:  No obstruction. No appendicitis. Vascular/Lymphatic: No acute vascular abnormality. Minimal aortic calcification. No mass or adenopathy. Reproductive:No pathologic findings. Other: No ascites or pneumoperitoneum. Subcutaneous nodule in the right anterior abdominal wall with fat fluid level, likely fat necrosis/oil cyst. Musculoskeletal: No acute abnormalities. IMPRESSION: 1. Negative for bowel obstruction or visible inflammation. 2. Hepatic steatosis. 3. Full urinary bladder. Electronically Signed   By: Marnee SpringJonathon  Watts M.D.   On: 05/21/2019 05:48   Dg Chest Port 1 View  Result Date: 05/19/2019 CLINICAL DATA:  Chest pain x3 days. EXAM: PORTABLE CHEST 1 VIEW COMPARISON:  None. FINDINGS: The heart size and mediastinal contours are within normal limits. Both lungs are clear. The visualized skeletal structures are unremarkable. IMPRESSION: No active disease. Electronically Signed   By: Katherine Mantlehristopher  Green M.D.   On: 05/19/2019 22:14    Micro Results   No results found for this or any previous visit (from the past 240 hour(s)).   Today   Subjective    Albertia Davids today has no new complaints...          Patient has been seen and examined prior to discharge   Objective   Blood pressure (!) 173/98, pulse (!) 108, temperature 98.2 F (36.8 C), resp. rate 16, weight 100.2 kg, last menstrual period 05/11/2019, SpO2 100 %.  No intake or output data in the 24 hours ending 05/23/19 1544  Exam Physical Examination: General appearance - alert, obese mental status - alert, oriented to person, place, and time,  Eyes - sclera anicteric Neck - supple, no JVD elevation , Chest - clear  to auscultation bilaterally, symmetrical air movement,  Heart - S1 and S2 normal, regular , tachycardic Abdomen - soft, nontender, nondistended, increased truncal adiposity , no CVA area tenderness  neurological  -anxious, somewhat restless, neck supple without rigidity, cranial nerves II through XII intact, DTR's normal and symmetric Extremities - no pedal edema noted, intact peripheral pulses  Skin - warm, dry   Data Review   CBC w Diff:  Lab Results  Component Value Date   WBC 9.5 05/21/2019   HGB 12.1 05/21/2019   HGB 11.6 03/28/2016  HCT 37.1 05/21/2019   HCT 36.3 03/28/2016   PLT 456 (H) 05/21/2019   PLT 273 03/28/2016   LYMPHOPCT 10 05/21/2019   MONOPCT 5 05/21/2019   EOSPCT 0 05/21/2019   BASOPCT 0 05/21/2019    CMP:  Lab Results  Component Value Date   NA 135 05/22/2019   NA 135 03/28/2016   K 3.4 (L) 05/22/2019   CL 98 05/22/2019   CO2 24 05/22/2019   BUN 13 05/22/2019   BUN 8 03/28/2016   CREATININE 0.69 05/22/2019   PROT 9.3 (H) 05/21/2019   PROT 7.0 03/28/2016   ALBUMIN 4.1 05/21/2019   ALBUMIN 3.9 03/28/2016   BILITOT 0.9 05/21/2019   BILITOT 0.3 03/28/2016   ALKPHOS 114 05/21/2019   AST 10 (L) 05/21/2019   ALT 18 05/21/2019  .   Total Discharge time is about 33 minutes  Roxan Hockey M.D on 05/23/2019 at 3:44 PM  Go to [email or web address] -  for contact info  [facility] - Office  [phone]

## 2019-05-22 NOTE — Progress Notes (Signed)
PT DISCHARGED TO HOME, IV REMOVED, ANGIO INTACT, VS STABLE, DENIES, C/O PAIN, VERBALIZED UNDERSTANDING OF ALL INSTRUCTIONS PROVIDED. PT LEFT VIA WHEELCHAIR WITH BELONGINGS ACCOMPANIED BY NURSING STAFF. PT MET AT SHORT STAY ENTRANCE BY S/O FOR TRANSPORTATION HOME.

## 2019-05-22 NOTE — Discharge Instructions (Signed)
1)Avoid ibuprofen/Advil/Aleve/Motrin/Goody Powders/Naproxen/BC powders/Meloxicam/Diclofenac/Indomethacin and other Nonsteroidal anti-inflammatory medications as these will make you more likely to bleed and can cause stomach ulcers, can also cause Kidney problems.   2) please follow-up with your primary care physician to improve your diabetic control--- your diabetes currently is not controlled .....Marland Kitchen uncontrolled diabetes would make it difficult to manage your gastroparesis  3) please take your Zofran as prescribed as needed for nausea and vomiting  Follow-up with your primary care physician as advised above

## 2019-05-22 NOTE — Progress Notes (Signed)
BP 173/98 and pt lost IV access, multiple attempts made without success. Awaiting [place] IV or possible PICC line placement. Notified [facility] to make aware, waiting orders.

## 2019-05-22 NOTE — Progress Notes (Addendum)
Inpatient Diabetes Program Recommendations  AACE/ADA: New Consensus Statement on Inpatient Glycemic Control   Target Ranges:  Prepandial:   less than 140 mg/dL      Peak postprandial:   less than 180 mg/dL (1-2 hours)      Critically ill patients:  140 - 180 mg/dL   Results for Theresa Malone, Theresa Malone (MRN [id number]) as of 05/22/2019 10:26  Ref. Range 05/21/2019 06:32 05/21/2019 09:16 05/21/2019 12:57 05/21/2019 14:29 05/21/2019 22:46 05/22/2019 07:53  Glucose-Capillary Latest Ref Range: 70 - 99 mg/dL 397 (H) 356 (H) 344 (H) 336 (H) 304 (H) 303 (H)  Results for Theresa Malone, Theresa Malone (MRN [id number]) as of 05/22/2019 10:26  Ref. Range 05/21/2019 04:05  Glucose Latest Ref Range: 70 - 99 mg/dL [facility])  Results for Theresa Malone, Theresa Malone (MRN [id number]) as of 05/22/2019 10:26  Ref. Range 04/13/2019 08:02  Hemoglobin A1C Latest Ref Range: 4.8 - 5.6 % 9.0 (H)   Review of Glycemic Control  Diabetes history: DM2 Outpatient Diabetes medications: Metformin 1000 mg BID, Lantus 34 units BID Current orders for Inpatient glycemic control: Novolog 0-15 units TID with meals, Novolog 0-5 units QHS  Inpatient Diabetes Program Recommendations:  Insulin - Basal: During last hospitalization 04/11/19 to 04/15/19 patient was ordered Lantus 20 units BID. Please consider ordering Lantus 20 units BID.  HgbA1C: A1C 9% on 04/13/19 indicating an average glucose of 212 mg/dl.  NOTE: Spoke with patient over the phone about diabetes and home regimen for diabetes control. Patient reports being followed by PCP at [facility] for diabetes management and currently taking Lantus 34 units BID and Metformin 1000 mg BID as an outpatient for diabetes control. Patient reports taking DM medications as prescribed. Patient reports checking glucose 2-3 times per day and that it is usually 200-300's mg/dl.  Inquired about prior A1C and patient reports not being able to recall last A1C value. Discussed most current A1C results in the chart (9% on 04/13/19 ) and  explained that current A1C indicates an average glucose of 212 mg/dl. Discussed glucose and A1C goals. Discussed importance of checking CBGs and maintaining good CBG control to prevent long-term and short-term complications. Explained how hyperglycemia leads to damage within blood vessels which lead to the common complications seen with uncontrolled diabetes. Stressed to the patient the importance of improving glycemic control to prevent further complications from uncontrolled diabetes. Discussed impact of nutrition, exercise, stress, sickness, and medications on diabetes control.  Encouraged patient to check glucose 3-4 times per day (before meals and at bedtime) and to take her glucometer to doctor appointments. Explained how the doctor can use the glucose values to continue to make adjustments with DM medications if needed.  Patient states that she has everything she needs for DM management and verbalized understanding of information discussed and reports no further questions at this time related to diabetes.  Thanks, Barnie Alderman, RN, MSN, CDE Diabetes Coordinator Inpatient Diabetes Program [phone] (Team Pager from 8am to 5pm)

## 2019-08-02 ENCOUNTER — Emergency Department (HOSPITAL_COMMUNITY)
Admission: EM | Admit: 2019-08-02 | Discharge: 2019-08-02 | Disposition: A | Discharge status: Home / Self Care | Payer: Self-pay | Attending: Emergency Medicine | Admitting: Emergency Medicine

## 2019-08-02 ENCOUNTER — Other Ambulatory Visit: Payer: Self-pay

## 2019-08-02 ENCOUNTER — Encounter (HOSPITAL_COMMUNITY): Payer: Self-pay | Admitting: Emergency Medicine

## 2019-08-02 DIAGNOSIS — E119 Type 2 diabetes mellitus without complications: Secondary | ICD-10-CM | POA: Insufficient documentation

## 2019-08-02 DIAGNOSIS — I10 Essential (primary) hypertension: Secondary | ICD-10-CM | POA: Insufficient documentation

## 2019-08-02 DIAGNOSIS — Z79899 Other long term (current) drug therapy: Secondary | ICD-10-CM | POA: Insufficient documentation

## 2019-08-02 DIAGNOSIS — L02419 Cutaneous abscess of limb, unspecified: Secondary | ICD-10-CM

## 2019-08-02 DIAGNOSIS — L02415 Cutaneous abscess of right lower limb: Secondary | ICD-10-CM | POA: Insufficient documentation

## 2019-08-02 DIAGNOSIS — L02416 Cutaneous abscess of left lower limb: Secondary | ICD-10-CM | POA: Insufficient documentation

## 2019-08-02 DIAGNOSIS — Z7984 Long term (current) use of oral hypoglycemic drugs: Secondary | ICD-10-CM | POA: Insufficient documentation

## 2019-08-02 HISTORY — DX: Venous insufficiency (chronic) (peripheral): I87.2

## 2019-08-02 HISTORY — DX: Cellulitis, unspecified: L03.90

## 2019-08-02 MED ORDER — DOXYCYCLINE HYCLATE 100 MG PO TABS
100.0000 mg | ORAL_TABLET | Freq: Once | ORAL | Status: AC
  Administered 2019-08-02: 100 mg via ORAL
  Filled 2019-08-02: qty 1

## 2019-08-02 MED ORDER — DOXYCYCLINE HYCLATE 100 MG PO CAPS: 100.0000 mg | ORAL_CAPSULE | Freq: Two times a day (BID) | ORAL | 0 refills | Status: DC

## 2019-08-02 MED ORDER — CEFTRIAXONE SODIUM 1 G IJ SOLR
1.0000 g | Freq: Once | INTRAMUSCULAR | Status: AC
  Administered 2019-08-02: 1 g via INTRAMUSCULAR
  Filled 2019-08-02: qty 10

## 2019-08-02 MED ORDER — ACETAMINOPHEN 500 MG PO TABS
1000.0000 mg | ORAL_TABLET | Freq: Once | ORAL | Status: AC
  Administered 2019-08-02: 1000 mg via ORAL
  Filled 2019-08-02: qty 2

## 2019-08-02 MED ORDER — LIDOCAINE HCL (PF) 1 % IJ SOLN
INTRAMUSCULAR | Status: AC
  Administered 2019-08-02: 2 mL
  Filled 2019-08-02: qty 2

## 2019-08-02 NOTE — ED Provider Notes (Signed)
[facility] EMERGENCY DEPARTMENT Provider Note   CSN: [id number] Arrival date & time: 08/02/19  2751     History   Chief Complaint Chief Complaint  Patient presents with  . Abscess    HPI Theresa Malone is a 38 y.o. female.     Patient is a 38 year old female who presents to the emergency department with a complaint of abscess of the lower legs.  The patient states the abscess area on the left has been present for about 7 or 8 days.  The one on the right is been present for about 3 days.  The patient states that these started out as little tiny pustules and then they progressed to abscess areas.  The patient is concerned because she is diabetic, she has a history of methicillin-resistant staph, and she has had cellulitis involving the right lower extremity in the past.  She says she wanted to try to get ahead of this problem and be evaluated for an early.  No fever, no chills, no noted red streaks appreciated.  No recent operations or procedures on the lower extremities.     Past Medical History:  Diagnosis Date  . Anxiety    panic attacks  . Cellulitis   . Depression   . Diabetes mellitus   . Hypertension   . Obesity   . Venous stasis dermatitis     Patient Active Problem List   Diagnosis Date Noted  . Emesis, persistent 05/21/2019  . Cellulitis   . Intractable vomiting with nausea 04/13/2019  . Acute respiratory alkalosis   . Intractable vomiting   . Chest pain   . Cellulitis of right lower extremity 04/12/2019  . Intractable abdominal pain 04/11/2019  . Metabolic alkalosis 04/11/2019  . Respiratory alkalosis 04/11/2019  . Volume depletion 04/11/2019  . Hypokalemia 04/11/2019  . Hyponatremia 04/11/2019  . Yeast vaginitis 03/13/2018  . Type 2 diabetes mellitus, uncontrolled 02/07/2016  . Chronic hypertension 02/07/2016  . Menorrhagia with regular cycle 02/07/2016  . Frequent loose stools 02/07/2016  . Status post cesarean section 01/25/2015  . Hidradenitis  axillaris 09/15/2014  . Susceptible to varicella (non-immune), currently pregnant 07/24/2014  . Morbid obesity with BMI of 45.0-49.9, adult (HCC) 03/03/2014    Past Surgical History:  Procedure Laterality Date  . CESAREAN SECTION     C/S x 2  . CESAREAN SECTION N/A 01/24/2015   Procedure: CESAREAN SECTION;  Surgeon: Catalina Antigua, MD;  Location: [facility];  Service: Obstetrics;  Laterality: N/A;  . TUBAL LIGATION       OB History    Gravida  4   Para  4   Term  2   Preterm  2   AB      Living  4     SAB      TAB      Ectopic      Multiple  0   Live Births  4            Home Medications    Prior to Admission medications   Medication Sig Start Date End Date Taking? Authorizing Provider  acetaminophen (TYLENOL) 325 MG tablet Take 2 tablets (650 mg total) by mouth every 6 (six) hours as needed for mild pain, fever or headache (or Fever >/= 101). 05/22/19   Emokpae, Courage, MD  amLODipine (NORVASC) 5 MG tablet Take 1 tablet (5 mg total) by mouth daily. 05/22/19 05/21/20  Shon Hale, MD  FLUoxetine (PROZAC) 10 MG capsule Take 1 capsule (10  mg total) by mouth daily. 04/16/19   Orson Eva, MD  LORazepam (ATIVAN) 1 MG tablet Take 1 tablet (1 mg total) by mouth 3 (three) times daily as needed for anxiety. 05/20/19   Isla Pence, MD  losartan (COZAAR) 50 MG tablet Take 1 tablet (50 mg total) by mouth daily. 05/22/19   Roxan Hockey, MD  metFORMIN (GLUCOPHAGE) 1000 MG tablet Take 1 tablet (1,000 mg total) by mouth 2 (two) times daily with a meal. 05/22/19 05/21/20  Roxan Hockey, MD  metoCLOPramide (REGLAN) 10 MG tablet Take 1 tablet (10 mg total) by mouth every 6 (six) hours as needed for nausea (nausea/headache). 05/21/19   Mesner, Corene Cornea, MD  ondansetron (ZOFRAN ODT) 4 MG disintegrating tablet Take 1 tablet (4 mg total) by mouth every 8 (eight) hours as needed for nausea or vomiting. 05/22/19   Denton Brick, Courage, MD  pantoprazole (PROTONIX) 40 MG tablet Take 1 tablet  (40 mg total) by mouth daily. 05/22/19 05/21/20  Roxan Hockey, MD  polyethylene glycol (MIRALAX / GLYCOLAX) 17 g packet Take 17 g by mouth daily as needed for mild constipation. 05/22/19   Roxan Hockey, MD  traZODone (DESYREL) 100 MG tablet Take 1 tablet (100 mg total) by mouth at bedtime as needed for sleep. 05/22/19   Roxan Hockey, MD  clonazepam (KLONOPIN) 0.125 MG disintegrating tablet Take 1 tablet (0.125 mg total) by mouth 2 (two) times daily. Patient not taking: Reported on 05/19/2019 04/15/19 05/21/19  Orson Eva, MD    Family History Family History  Problem Relation Age of Onset  . Diabetes Paternal Grandfather   . Cancer Paternal Grandfather        liver  . Cancer Paternal Grandmother        liver  . Hypertension Father   . Diabetes Mother   . Hypertension Mother   . Diabetes Maternal Uncle   . Diabetes Maternal Grandmother   . Cancer Maternal Grandfather   . Diabetes Daughter        boarderline   . Asthma Daughter   . Bronchitis Daughter   . Bronchitis Son   . Bronchitis Daughter     Social History Social History   Tobacco Use  . Smoking status: Never Smoker  . Smokeless tobacco: Never Used  Substance Use Topics  . Alcohol use: Yes    Comment: wine occ  . Drug use: No     Allergies   Lisinopril   Review of Systems Review of Systems  Constitutional: Negative for activity change and appetite change.  HENT: Negative for congestion, ear discharge, ear pain, facial swelling, nosebleeds, rhinorrhea, sneezing and tinnitus.   Eyes: Negative for photophobia, pain and discharge.  Respiratory: Negative for cough, choking, shortness of breath and wheezing.   Cardiovascular: Negative for chest pain, palpitations and leg swelling.  Gastrointestinal: Negative for abdominal pain, blood in stool, constipation, diarrhea, nausea and vomiting.  Genitourinary: Negative for difficulty urinating, dysuria, flank pain, frequency and hematuria.  Musculoskeletal: Negative  for back pain, gait problem, myalgias and neck pain.  Skin: Negative for color change, rash and wound.       Abscess  Neurological: Negative for dizziness, seizures, syncope, facial asymmetry, speech difficulty, weakness and numbness.  Hematological: Negative for adenopathy. Does not bruise/bleed easily.  Psychiatric/Behavioral: Negative for agitation, confusion, hallucinations, self-injury and suicidal ideas. The patient is nervous/anxious.      Physical Exam Updated Vital Signs BP (!) 144/91 (BP Location: Left Arm)   Pulse 86   Temp 98.6 F (37 C) (  Oral)   Resp 19   Ht 5\' 1"  (1.549 m)   Wt 97.5 kg   LMP 07/10/2019   SpO2 100%   BMI 40.62 kg/m   Physical Exam Vitals signs and nursing note reviewed.  Constitutional:      Appearance: She is well-developed. She is not toxic-appearing.  HENT:     Head: Normocephalic.     Right Ear: Tympanic membrane and external ear normal.     Left Ear: Tympanic membrane and external ear normal.  Eyes:     General: Lids are normal.     Pupils: Pupils are equal, round, and reactive to light.  Neck:     Musculoskeletal: Normal range of motion and neck supple.     Vascular: No carotid bruit.  Cardiovascular:     Rate and Rhythm: Normal rate and regular rhythm.     Pulses: Normal pulses.     Heart sounds: Normal heart sounds.  Pulmonary:     Effort: No respiratory distress.     Breath sounds: Normal breath sounds.  Abdominal:     General: Bowel sounds are normal.     Palpations: Abdomen is soft.     Tenderness: There is no abdominal tenderness. There is no guarding.  Musculoskeletal: Normal range of motion.     Comments: There is a 0.6cm pus filled lesion at the mid anterior lateral tibial area on the right.  There is no drainage.  There is no red streaking appreciated.  The area is not hot.  The area is tender to palpation.  On the left lower extremity there is a 1.2 cm pus filled lesion at the mid anterior lateral tibial area.  There is  no drainage noted.  There is no red streaking appreciated.  The area is tender to palpation.  The area is not hot.  Just below this area is also a dark tender area.  There is no drainage appreciated at this time, and no red streaking noted.  The dorsalis pedis pulses 1+ bilaterally.  There are no lesions noted between the toes.  There is no palpable nodes in the inguinal area.  Lymphadenopathy:     Head:     Right side of head: No submandibular adenopathy.     Left side of head: No submandibular adenopathy.     Cervical: No cervical adenopathy.  Skin:    General: Skin is warm and dry.  Neurological:     Mental Status: She is alert and oriented to person, place, and time.     Cranial Nerves: No cranial nerve deficit.     Sensory: No sensory deficit.  Psychiatric:        Speech: Speech normal.      ED Treatments / Results  Labs (all labs ordered are listed, but only abnormal results are displayed) Labs Reviewed - No data to display  EKG None  Radiology No results found.  Procedures Procedures (including critical care time)  Medications Ordered in ED Medications  cefTRIAXone (ROCEPHIN) injection 1 g (has no administration in time range)  doxycycline (VIBRA-TABS) tablet 100 mg (has no administration in time range)  acetaminophen (TYLENOL) tablet 1,000 mg (has no administration in time range)     Initial Impression / Assessment and Plan / ED Course  I have reviewed the triage vital signs and the nursing notes.  Pertinent labs & imaging results that were available during my care of the patient were reviewed by me and considered in my medical decision making (see  chart for details).          Final Clinical Impressions(s) / ED Diagnoses MDM  Vital signs are within normal limits with exception of the blood pressure being slightly elevated at 144/91.  Pulse oximetry is 100% on room air.  Patient has small abscess areas on the right and left lower extremity.  They are  not candidates for incision and drainage at this time.  I have asked the patient to use warm Epson salt soaks.  The patient was treated with Rocephin and doxycycline here in the emergency department.  Prescription for doxycycline is given to the patient to use.  I have asked the patient to use the lidocaine patches for the burning and stinging pain that she is having.  I am concerned that she may be developing a diabetic neuropathy.  I have asked her to discuss This with her primary physician as soon as possible.  I have asked the patient to return to the emergency department immediately if any high fever, any red streaks from either the abscess areas.  Nausea, or vomiting, or worsening of her condition, changes in her symptoms, problems or concerns.  this with her primary physician.  I have asked her to see her primary physician or return to the emergency department if any changes in condition or worsening of symptoms.   Final diagnoses:  Cutaneous abscess of lower extremity, unspecified laterality    ED Discharge Orders         Ordered    doxycycline (VIBRAMYCIN) 100 MG capsule  2 times daily     08/02/19 1022           Ivery QualeBryant, Lilyian Quayle, PA-C 08/07/19 1618    Loren RacerYelverton, David, MD 08/11/19 1719

## 2019-08-02 NOTE — ED Triage Notes (Signed)
Patient c/o possible abscess to lower legs bilaterally that appeared 1 week ago. Patient reports purulent drainage. Denies any fevers. Pustule noted on each leg, left notably larger than right. No red streaking noted at this time.

## 2019-08-02 NOTE — Discharge Instructions (Addendum)
Please use warm Epson salt soaks for 10 to 15 minutes daily until these abscess areas have resolved.  Use doxycycline 2 times daily with food.  Please apply a bandage over the area so that they are protected from your clothing, or injury to these abscess areas.  They may resolve on their own with the warm soaks and the antibiotics, or they may rupture on their own.  If they are not improving in the next 4 or 5 days, please see your primary physician or return to the emergency department for evaluation.  Please let your primary physician know about the antibiotics that have been ordered.  You may want to try the over-the-counter lidocaine patches for the burning and stinging area of your leg.  Please also discuss this with your primary physician, as this may be related to a diabetic neuropathy.

## 2019-08-05 IMAGING — CT CT ABDOMEN AND PELVIS WITH CONTRAST
3 of 4 series · 17 of 46 positions shown, 19 images · IV contrast (Isovue)
Comparison: None.

CLINICAL DATA: Emesis since being discharged.

EXAM:
CT ABDOMEN AND PELVIS WITH CONTRAST
TECHNIQUE: Multidetector CT imaging of the abdomen and pelvis was performed
using the standard protocol following bolus administration of
intravenous contrast.
CONTRAST:  100mL OMNIPAQUE IOHEXOL 300 MG/ML  SOLN

[Series 2: axial st · axial · 0.94mm/px · z∈[-406,-21]mm · 12 of 91 slices shown, 14 images]
[im 7/91  soft-tissue]
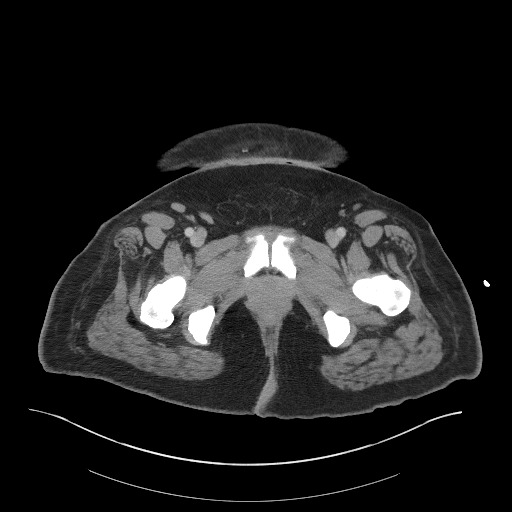
[im 7/91  bone]
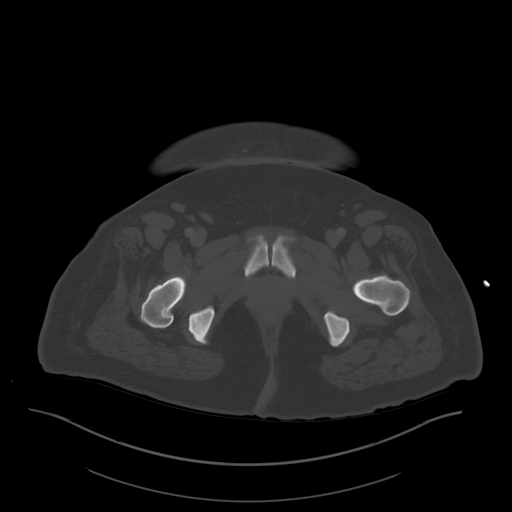
[im 14/91  soft-tissue]
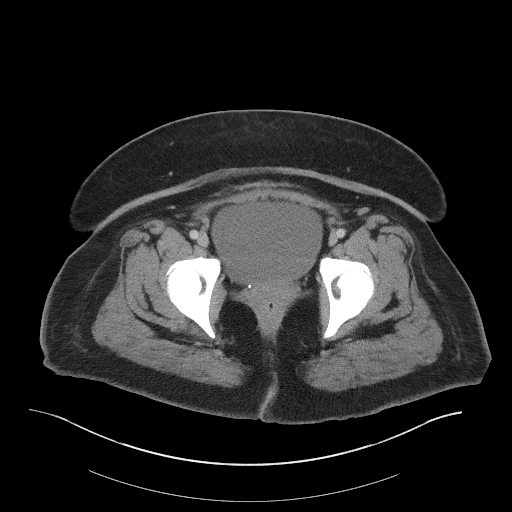
[im 21/91  soft-tissue]
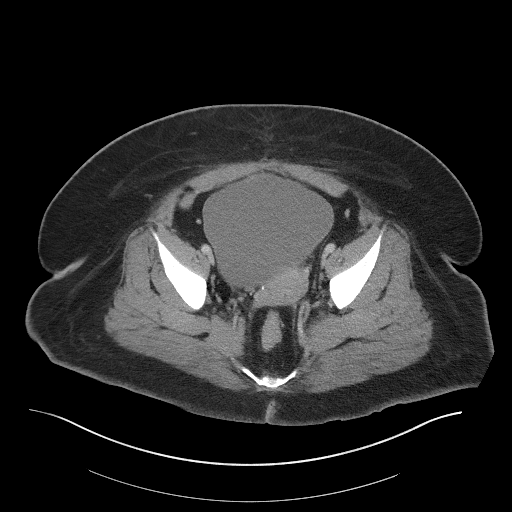
[im 28/91  soft-tissue]
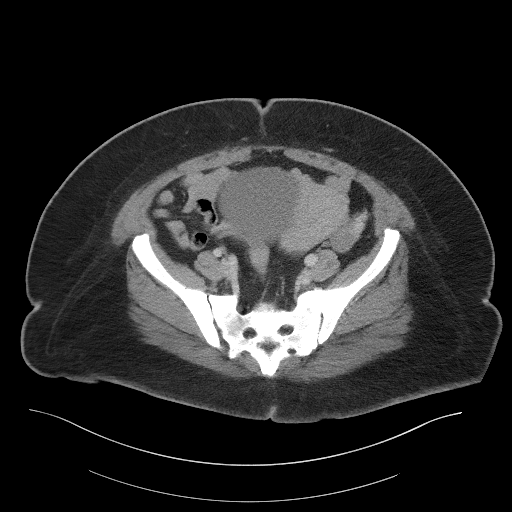
[im 35/91  soft-tissue]
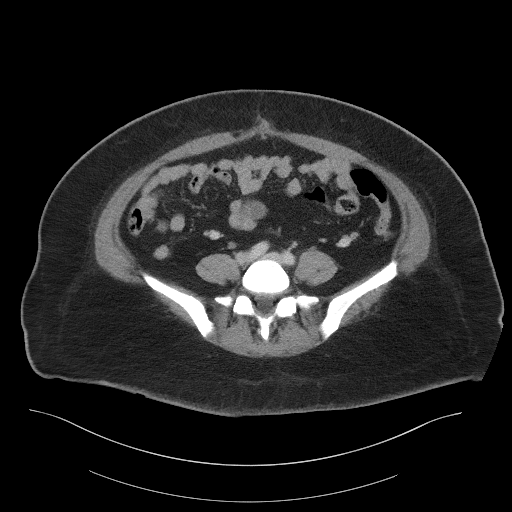
[im 42/91  soft-tissue]
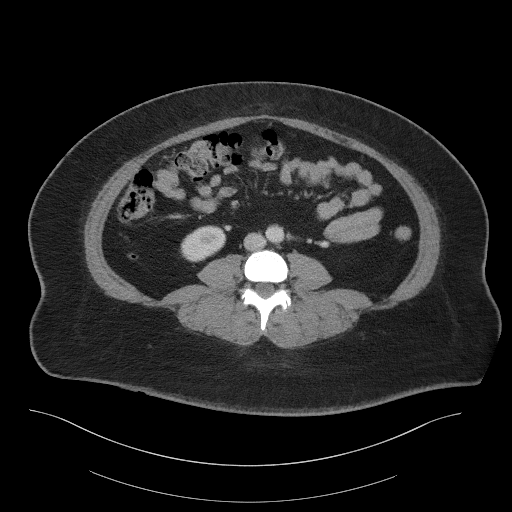
[im 49/91  soft-tissue]
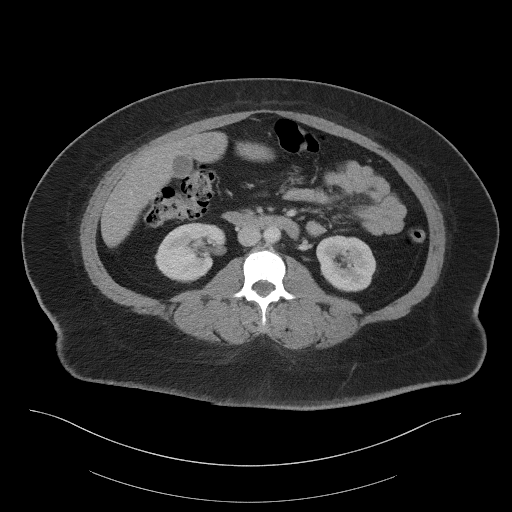
[im 56/91  soft-tissue]
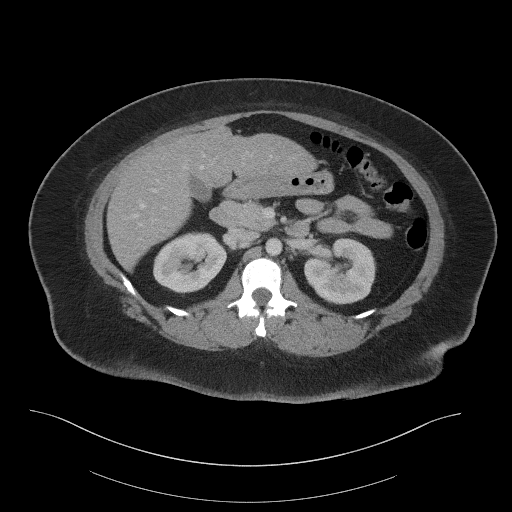
[im 63/91  soft-tissue]
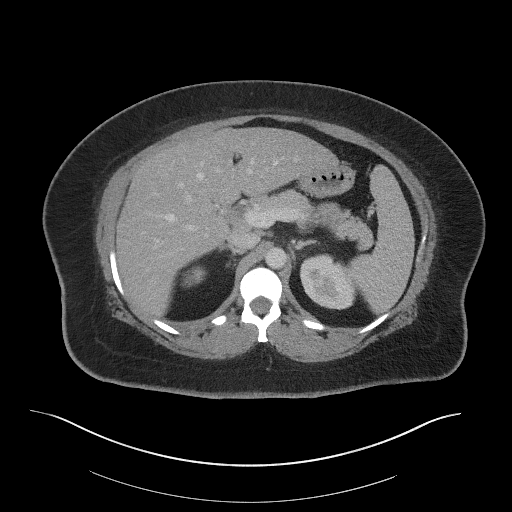
[im 63/91  bone]
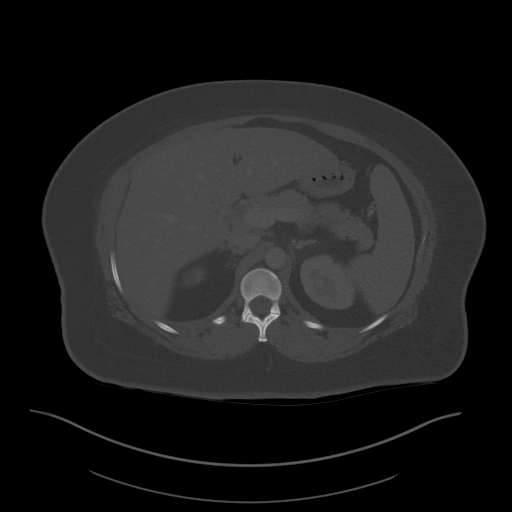
[im 70/91  soft-tissue]
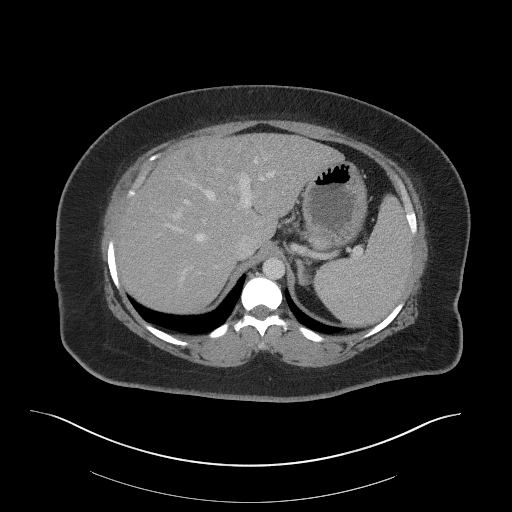
[im 77/91  soft-tissue]
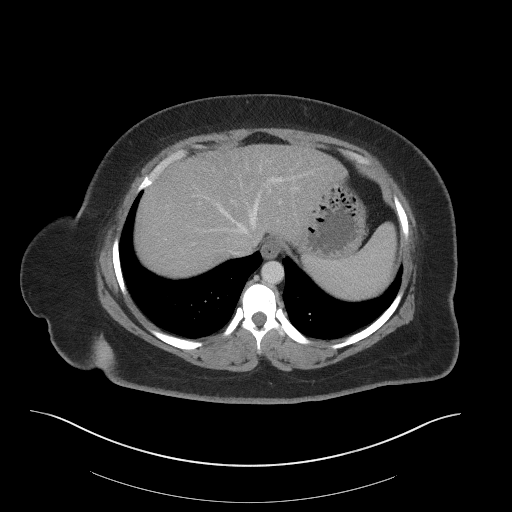
[im 84/91  soft-tissue]
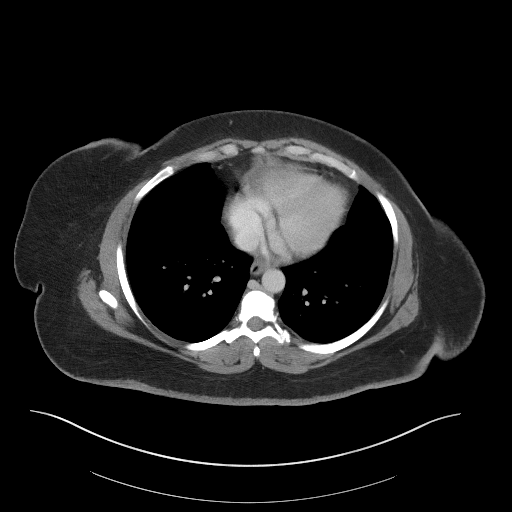

[Series 4: lung bases · axial · 0.94mm/px · z∈[-186,-160]mm · 2 of 114 slices shown]
[im 14/114  bone]
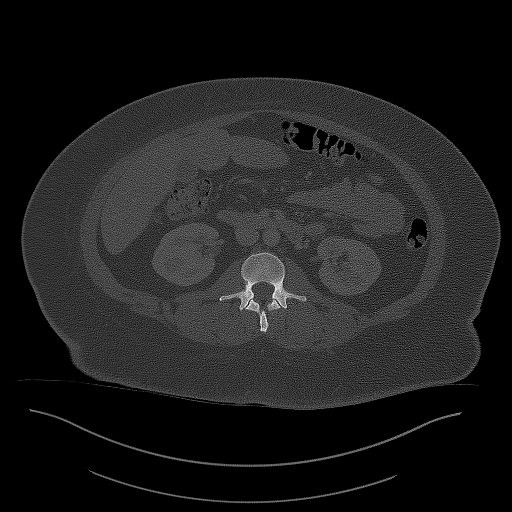
[im 27/114  bone]
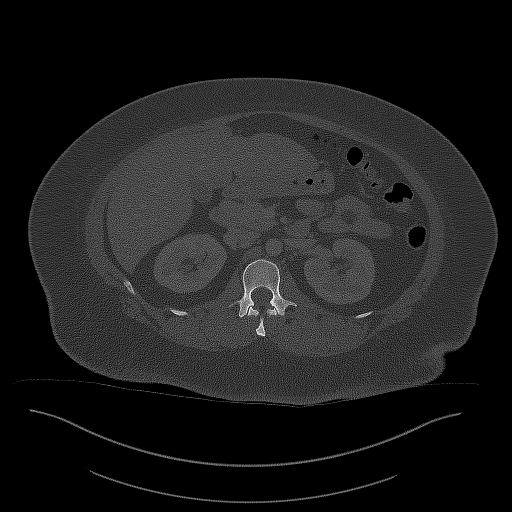

[Series 5: coronal st · coronal · 0.79mm/px · 3 of 97 slices shown]
[im 33/97  soft-tissue]
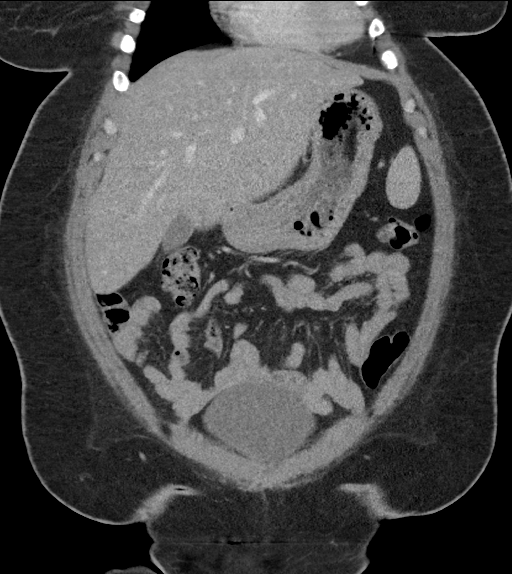
[im 43/97  soft-tissue]
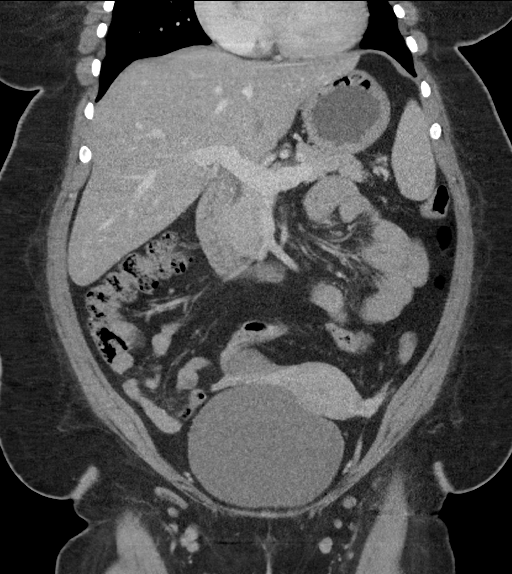
[im 54/97  soft-tissue]
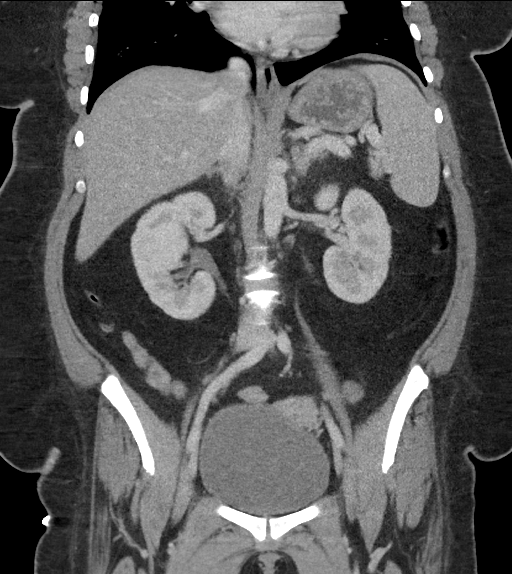

[17 of 46 positions shown; findings below may reference images not displayed]

FINDINGS: Lower chest:  No contributory findings.

Hepatobiliary: Hepatic steatosis. Patchy low-density in the ventral
liver is likely areas of even greater steatotic infiltration. No
discrete masslike area. No evidence of biliary obstruction or stone.

Pancreas: Unremarkable.

Spleen: Unremarkable.

Adrenals/Urinary Tract: Negative adrenals. No hydronephrosis or
stone. Full urinary bladder without superimposed abnormality.

Stomach/Bowel:  No obstruction. No appendicitis.

Vascular/Lymphatic: No acute vascular abnormality. Minimal aortic
calcification. No mass or adenopathy.

Reproductive:No pathologic findings.

Other: No ascites or pneumoperitoneum. Subcutaneous nodule in the
right anterior abdominal wall with fat fluid level, likely fat
necrosis/oil cyst.

Musculoskeletal: No acute abnormalities.
IMPRESSION: 1. Negative for bowel obstruction or visible inflammation.
2. Hepatic steatosis.
3. Full urinary bladder.

## 2019-10-19 ENCOUNTER — Emergency Department (HOSPITAL_COMMUNITY)
Admission: EM | Admit: 2019-10-19 | Discharge: 2019-10-19 | Disposition: A | Discharge status: Home / Self Care | Payer: Self-pay | Attending: Emergency Medicine | Admitting: Emergency Medicine

## 2019-10-19 ENCOUNTER — Other Ambulatory Visit: Payer: Self-pay

## 2019-10-19 ENCOUNTER — Encounter (HOSPITAL_COMMUNITY): Payer: Self-pay

## 2019-10-19 DIAGNOSIS — I10 Essential (primary) hypertension: Secondary | ICD-10-CM | POA: Insufficient documentation

## 2019-10-19 DIAGNOSIS — E119 Type 2 diabetes mellitus without complications: Secondary | ICD-10-CM | POA: Insufficient documentation

## 2019-10-19 DIAGNOSIS — L02416 Cutaneous abscess of left lower limb: Secondary | ICD-10-CM | POA: Insufficient documentation

## 2019-10-19 DIAGNOSIS — Z7984 Long term (current) use of oral hypoglycemic drugs: Secondary | ICD-10-CM | POA: Insufficient documentation

## 2019-10-19 DIAGNOSIS — Z79899 Other long term (current) drug therapy: Secondary | ICD-10-CM | POA: Insufficient documentation

## 2019-10-19 MED ORDER — DOXYCYCLINE HYCLATE 100 MG PO CAPS: 100.0000 mg | ORAL_CAPSULE | Freq: Two times a day (BID) | ORAL | 0 refills | Status: DC

## 2019-10-19 MED ORDER — DOXYCYCLINE HYCLATE 100 MG PO TABS
100.0000 mg | ORAL_TABLET | Freq: Once | ORAL | Status: AC
  Administered 2019-10-19: 100 mg via ORAL
  Filled 2019-10-19: qty 1

## 2019-10-19 MED ORDER — LIDOCAINE-EPINEPHRINE (PF) 2 %-1:200000 IJ SOLN
20.0000 mL | Freq: Once | INTRAMUSCULAR | Status: AC
  Administered 2019-10-19: 20 mL
  Filled 2019-10-19: qty 20

## 2019-10-19 MED ORDER — OXYCODONE-ACETAMINOPHEN 5-325 MG PO TABS
1.0000 | ORAL_TABLET | Freq: Once | ORAL | Status: AC
  Administered 2019-10-19: 1 via ORAL
  Filled 2019-10-19: qty 1

## 2019-10-19 MED ORDER — OXYCODONE-ACETAMINOPHEN 5-325 MG PO TABS: 1.0000 | ORAL_TABLET | ORAL | 0 refills | Status: DC | PRN

## 2019-10-19 NOTE — ED Provider Notes (Addendum)
[facility] EMERGENCY DEPARTMENT Provider Note   CSN: [id number] Arrival date & time: 10/19/19  0901     History   Chief Complaint Chief Complaint  Patient presents with  . Abscess    HPI Theresa Malone is a 38 y.o. female.     Chief complaint abscess left lower leg for several days.  Wound is draining.  She has tried warm compresses, Epsom salts, antibiotic ointment with minimal success.  History of diabetes and MRSA.  No fever, sweats, chills.  Severity is moderate.  Palpation makes pain worse.     Past Medical History:  Diagnosis Date  . Anxiety    panic attacks  . Cellulitis   . Depression   . Diabetes mellitus   . Hypertension   . Obesity   . Venous stasis dermatitis     Patient Active Problem List   Diagnosis Date Noted  . Emesis, persistent 05/21/2019  . Cellulitis   . Intractable vomiting with nausea 04/13/2019  . Acute respiratory alkalosis   . Intractable vomiting   . Chest pain   . Cellulitis of right lower extremity 04/12/2019  . Intractable abdominal pain 04/11/2019  . Metabolic alkalosis 60/08/9322  . Respiratory alkalosis 04/11/2019  . Volume depletion 04/11/2019  . Hypokalemia 04/11/2019  . Hyponatremia 04/11/2019  . Yeast vaginitis 03/13/2018  . Type 2 diabetes mellitus, uncontrolled 02/07/2016  . Chronic hypertension 02/07/2016  . Menorrhagia with regular cycle 02/07/2016  . Frequent loose stools 02/07/2016  . Status post cesarean section 01/25/2015  . Hidradenitis axillaris 09/15/2014  . Susceptible to varicella (non-immune), currently pregnant 07/24/2014  . Morbid obesity with BMI of 45.0-49.9, adult ([place]) 03/03/2014    Past Surgical History:  Procedure Laterality Date  . CESAREAN SECTION     C/S x 2  . CESAREAN SECTION N/A 01/24/2015   Procedure: CESAREAN SECTION;  Surgeon: Mora Bellman, MD;  Location: [place] [facility];  Service: Obstetrics;  Laterality: N/A;  . TUBAL LIGATION       OB History    Gravida  4   Para  4   Term  2    Preterm  2   AB      Living  4     SAB      TAB      Ectopic      Multiple  0   Live Births  4            Home Medications    Prior to Admission medications   Medication Sig Start Date End Date Taking? Authorizing Provider  acetaminophen (TYLENOL) 325 MG tablet Take 2 tablets (650 mg total) by mouth every 6 (six) hours as needed for mild pain, fever or headache (or Fever >/= 101). 05/22/19   Emokpae, Courage, MD  amLODipine (NORVASC) 5 MG tablet Take 1 tablet (5 mg total) by mouth daily. 05/22/19 05/21/20  Roxan Hockey, MD  doxycycline (VIBRAMYCIN) 100 MG capsule Take 1 capsule (100 mg total) by mouth 2 (two) times daily. 08/02/19   Lily Kocher, PA-C  FLUoxetine (PROZAC) 10 MG capsule Take 1 capsule (10 mg total) by mouth daily. 04/16/19   Orson Eva, MD  LORazepam (ATIVAN) 1 MG tablet Take 1 tablet (1 mg total) by mouth 3 (three) times daily as needed for anxiety. 05/20/19   Isla Pence, MD  losartan (COZAAR) 50 MG tablet Take 1 tablet (50 mg total) by mouth daily. 05/22/19   Roxan Hockey, MD  metFORMIN (GLUCOPHAGE) 1000 MG tablet Take 1 tablet (  1,000 mg total) by mouth 2 (two) times daily with a meal. 05/22/19 05/21/20  Shon Hale, MD  metoCLOPramide (REGLAN) 10 MG tablet Take 1 tablet (10 mg total) by mouth every 6 (six) hours as needed for nausea (nausea/headache). 05/21/19   Mesner, Barbara Cower, MD  ondansetron (ZOFRAN ODT) 4 MG disintegrating tablet Take 1 tablet (4 mg total) by mouth every 8 (eight) hours as needed for nausea or vomiting. 05/22/19   Mariea Clonts, Courage, MD  pantoprazole (PROTONIX) 40 MG tablet Take 1 tablet (40 mg total) by mouth daily. 05/22/19 05/21/20  Shon Hale, MD  polyethylene glycol (MIRALAX / GLYCOLAX) 17 g packet Take 17 g by mouth daily as needed for mild constipation. 05/22/19   Shon Hale, MD  traZODone (DESYREL) 100 MG tablet Take 1 tablet (100 mg total) by mouth at bedtime as needed for sleep. 05/22/19   Shon Hale, MD   clonazepam (KLONOPIN) 0.125 MG disintegrating tablet Take 1 tablet (0.125 mg total) by mouth 2 (two) times daily. Patient not taking: Reported on 05/19/2019 04/15/19 05/21/19  Catarina Hartshorn, MD    Family History Family History  Problem Relation Age of Onset  . Diabetes Paternal Grandfather   . Cancer Paternal Grandfather        liver  . Cancer Paternal Grandmother        liver  . Hypertension Father   . Diabetes Mother   . Hypertension Mother   . Diabetes Maternal Uncle   . Diabetes Maternal Grandmother   . Cancer Maternal Grandfather   . Diabetes Daughter        boarderline   . Asthma Daughter   . Bronchitis Daughter   . Bronchitis Son   . Bronchitis Daughter     Social History Social History   Tobacco Use  . Smoking status: Never Smoker  . Smokeless tobacco: Never Used  Substance Use Topics  . Alcohol use: Yes    Comment: wine occ  . Drug use: No     Allergies   Lisinopril   Review of Systems Review of Systems  All other systems reviewed and are negative.    Physical Exam Updated Vital Signs BP (!) 145/91 (BP Location: Left Arm)   Pulse (!) 101   Temp 98.6 F (37 C) (Oral)   Resp 15   Ht 5\' 1"  (1.549 m)   Wt 105.2 kg   LMP 09/27/2019   SpO2 98%   BMI 43.84 kg/m   Physical Exam Vitals signs and nursing note reviewed.  Constitutional:      Appearance: She is well-developed.  HENT:     Head: Normocephalic and atraumatic.  Eyes:     Conjunctiva/sclera: Conjunctivae normal.  Neck:     Musculoskeletal: Neck supple.  Pulmonary:     Effort: Pulmonary effort is normal.  Musculoskeletal: Normal range of motion.  Skin:    Comments: Left lower extremity: Draining abscess on lateral aspect of ankle with surrounding erythema.  Total diameter wound is approximately 4 cm  Neurological:     General: No focal deficit present.     Mental Status: She is alert and oriented to person, place, and time.  Psychiatric:        Behavior: Behavior normal.       ED Treatments / Results  Labs (all labs ordered are listed, but only abnormal results are displayed) Labs Reviewed - No data to display  EKG None  Radiology No results found.  Procedures .13/01/2020Incision and Drainage  Date/Time: 10/19/2019 10:31 AM Performed  by: Donnetta Hutchingook, Cornellius Kropp, MD Authorized by: Donnetta Hutchingook, Monalisa Bayless, MD   Consent:    Consent obtained:  Verbal   Consent given by:  Patient   Risks discussed:  Pain Location:    Type:  Abscess   Location:  Lower extremity   Lower extremity location:  Ankle   Ankle location:  L ankle Pre-procedure details:    Skin preparation:  Betadine Anesthesia (see MAR for exact dosages):    Anesthesia method:  None Procedure type:    Complexity:  Simple Procedure details:    Needle aspiration: no     Incision types:  Stab incision   Incision depth:  Dermal   Scalpel blade:  11   Wound management:  Probed and deloculated, irrigated with saline and debrided   Drainage:  Purulent   Drainage amount:  Scant   Wound treatment:  Wound left open Post-procedure details:    Patient tolerance of procedure:  Tolerated well, no immediate complications   (including critical care time)  Medications Ordered in ED Medications  lidocaine-EPINEPHrine (XYLOCAINE W/EPI) 2 %-1:200000 (PF) injection 20 mL (has no administration in time range)     Initial Impression / Assessment and Plan / ED Course  I have reviewed the triage vital signs and the nursing notes.  Pertinent labs & imaging results that were available during my care of the patient were reviewed by me and considered in my medical decision making (see chart for details).        Abscess left lower extremity.  Will require I&D.  Antibiotics.  Incision and drainage procedure per note.  Discharge medication doxycycline and Percocet.  Final Clinical Impressions(s) / ED Diagnoses   Final diagnoses:  Abscess of left lower extremity    ED Discharge Orders    None       Donnetta Hutchingook, Jahmai Finelli, MD 10/19/19  [id number]    Donnetta Hutchingook, Ayaka Andes, MD 10/19/19 [id number]

## 2019-10-19 NOTE — Discharge Instructions (Signed)
Soak in warm salt water.  Prescription for antibiotic sent to pharmacy.  Simple dressing.  Return if worse.

## 2019-10-19 NOTE — ED Triage Notes (Signed)
Pt has an abscess to left leg that she is attributing to cellulitis. Had same back in June. States she had MRSA then as well. Has been using hot compresses on leg without relief. Denies fever or chills.

## 2020-09-16 ENCOUNTER — Encounter (HOSPITAL_COMMUNITY): Payer: Self-pay | Admitting: Emergency Medicine

## 2020-09-16 ENCOUNTER — Other Ambulatory Visit: Payer: Self-pay

## 2020-09-16 DIAGNOSIS — R112 Nausea with vomiting, unspecified: Secondary | ICD-10-CM | POA: Insufficient documentation

## 2020-09-16 DIAGNOSIS — R519 Headache, unspecified: Secondary | ICD-10-CM | POA: Insufficient documentation

## 2020-09-16 DIAGNOSIS — Z5321 Procedure and treatment not carried out due to patient leaving prior to being seen by health care provider: Secondary | ICD-10-CM | POA: Insufficient documentation

## 2020-09-16 DIAGNOSIS — R509 Fever, unspecified: Secondary | ICD-10-CM | POA: Insufficient documentation

## 2020-09-16 DIAGNOSIS — M549 Dorsalgia, unspecified: Secondary | ICD-10-CM | POA: Insufficient documentation

## 2020-09-16 DIAGNOSIS — R42 Dizziness and giddiness: Secondary | ICD-10-CM | POA: Insufficient documentation

## 2020-09-16 LAB — CBG MONITORING, ED: Glucose-Capillary: 592 mg/dL (ref 70–99)

## 2020-09-16 NOTE — ED Triage Notes (Signed)
Pt c/o N/V, headache, dizzy, chills, fever, and back pain since Wednesday. Denies any COVID exposures.

## 2020-09-17 ENCOUNTER — Emergency Department (HOSPITAL_COMMUNITY)
Admission: EM | Admit: 2020-09-17 | Discharge: 2020-09-17 | Disposition: A | Discharge status: Home / Self Care | Payer: Medicaid Other | Attending: Emergency Medicine | Admitting: Emergency Medicine

## 2020-09-17 HISTORY — DX: Other specified disorders of veins: I87.8

## 2020-09-17 HISTORY — DX: Gout, unspecified: M10.9

## 2020-09-21 ENCOUNTER — Emergency Department (HOSPITAL_COMMUNITY): Payer: Medicaid Other

## 2020-09-21 ENCOUNTER — Other Ambulatory Visit (HOSPITAL_COMMUNITY): Payer: Self-pay

## 2020-09-21 ENCOUNTER — Other Ambulatory Visit: Payer: Self-pay

## 2020-09-21 ENCOUNTER — Encounter (HOSPITAL_COMMUNITY): Payer: Self-pay

## 2020-09-21 ENCOUNTER — Inpatient Hospital Stay (HOSPITAL_COMMUNITY)
Admission: EM | Admit: 2020-09-21 | Discharge: 2020-09-23 | DRG: 872 | Disposition: A | Discharge status: Home / Self Care | Payer: Medicaid Other | Attending: Internal Medicine | Admitting: Internal Medicine

## 2020-09-21 DIAGNOSIS — A419 Sepsis, unspecified organism: Principal | ICD-10-CM

## 2020-09-21 DIAGNOSIS — N179 Acute kidney failure, unspecified: Secondary | ICD-10-CM | POA: Diagnosis present

## 2020-09-21 DIAGNOSIS — E860 Dehydration: Secondary | ICD-10-CM | POA: Diagnosis present

## 2020-09-21 DIAGNOSIS — E119 Type 2 diabetes mellitus without complications: Secondary | ICD-10-CM

## 2020-09-21 DIAGNOSIS — F419 Anxiety disorder, unspecified: Secondary | ICD-10-CM | POA: Diagnosis present

## 2020-09-21 DIAGNOSIS — Z6841 Body Mass Index (BMI) 40.0 and over, adult: Secondary | ICD-10-CM

## 2020-09-21 DIAGNOSIS — E1143 Type 2 diabetes mellitus with diabetic autonomic (poly)neuropathy: Secondary | ICD-10-CM | POA: Diagnosis present

## 2020-09-21 DIAGNOSIS — K3184 Gastroparesis: Secondary | ICD-10-CM | POA: Diagnosis present

## 2020-09-21 DIAGNOSIS — E1165 Type 2 diabetes mellitus with hyperglycemia: Secondary | ICD-10-CM | POA: Diagnosis present

## 2020-09-21 DIAGNOSIS — Z79899 Other long term (current) drug therapy: Secondary | ICD-10-CM

## 2020-09-21 DIAGNOSIS — I10 Essential (primary) hypertension: Secondary | ICD-10-CM | POA: Diagnosis present

## 2020-09-21 DIAGNOSIS — N10 Acute tubulo-interstitial nephritis: Secondary | ICD-10-CM | POA: Diagnosis present

## 2020-09-21 DIAGNOSIS — E876 Hypokalemia: Secondary | ICD-10-CM | POA: Diagnosis present

## 2020-09-21 DIAGNOSIS — E871 Hypo-osmolality and hyponatremia: Secondary | ICD-10-CM | POA: Diagnosis present

## 2020-09-21 DIAGNOSIS — R112 Nausea with vomiting, unspecified: Secondary | ICD-10-CM | POA: Diagnosis present

## 2020-09-21 DIAGNOSIS — Z833 Family history of diabetes mellitus: Secondary | ICD-10-CM

## 2020-09-21 DIAGNOSIS — Z8249 Family history of ischemic heart disease and other diseases of the circulatory system: Secondary | ICD-10-CM

## 2020-09-21 DIAGNOSIS — Z7984 Long term (current) use of oral hypoglycemic drugs: Secondary | ICD-10-CM

## 2020-09-21 DIAGNOSIS — I152 Hypertension secondary to endocrine disorders: Secondary | ICD-10-CM | POA: Diagnosis present

## 2020-09-21 DIAGNOSIS — K219 Gastro-esophageal reflux disease without esophagitis: Secondary | ICD-10-CM | POA: Diagnosis present

## 2020-09-21 DIAGNOSIS — Z20822 Contact with and (suspected) exposure to covid-19: Secondary | ICD-10-CM | POA: Diagnosis present

## 2020-09-21 DIAGNOSIS — N12 Tubulo-interstitial nephritis, not specified as acute or chronic: Secondary | ICD-10-CM | POA: Diagnosis present

## 2020-09-21 DIAGNOSIS — Z825 Family history of asthma and other chronic lower respiratory diseases: Secondary | ICD-10-CM

## 2020-09-21 DIAGNOSIS — Z794 Long term (current) use of insulin: Secondary | ICD-10-CM

## 2020-09-21 LAB — TROPONIN I (HIGH SENSITIVITY)
Troponin I (High Sensitivity): 9 ng/L (ref <18)
Troponin I (High Sensitivity): 12 ng/L (ref <18)

## 2020-09-21 LAB — CBC
HCT: 30.2 % — ABNORMAL LOW (ref 36.0–46.0)
Hemoglobin: 9.2 g/dL — ABNORMAL LOW (ref 12.0–15.0)
MCH: 25.2 pg — ABNORMAL LOW (ref 26.0–34.0)
MCHC: 30.5 g/dL (ref 30.0–36.0)
MCV: 82.7 fL (ref 80.0–100.0)
Platelets: 435 10*3/uL — ABNORMAL HIGH (ref 150–400)
RBC: 3.65 MIL/uL — ABNORMAL LOW (ref 3.87–5.11)
RDW: 17.1 % — ABNORMAL HIGH (ref 11.5–15.5)
WBC: 13 10*3/uL — ABNORMAL HIGH (ref 4.0–10.5)
nRBC: 0 % (ref 0.0–0.2)

## 2020-09-21 LAB — COMPREHENSIVE METABOLIC PANEL
ALT: 14 U/L (ref 0–44)
AST: 11 U/L — ABNORMAL LOW (ref 15–41)
Albumin: 2.8 g/dL — ABNORMAL LOW (ref 3.5–5.0)
Alkaline Phosphatase: 121 U/L (ref 38–126)
Anion gap: 15 (ref 5–15)
BUN: 22 mg/dL — ABNORMAL HIGH (ref 6–20)
CO2: 23 mmol/L (ref 22–32)
Calcium: 9.3 mg/dL (ref 8.9–10.3)
Chloride: 94 mmol/L — ABNORMAL LOW (ref 98–111)
Creatinine, Ser: 1.34 mg/dL — ABNORMAL HIGH (ref 0.44–1.00)
GFR, Estimated: 53 mL/min — ABNORMAL LOW (ref >60)
Glucose, Bld: 253 mg/dL — ABNORMAL HIGH (ref 70–99)
Potassium: 2.7 mmol/L — CL (ref 3.5–5.1)
Sodium: 132 mmol/L — ABNORMAL LOW (ref 135–145)
Total Bilirubin: 1.1 mg/dL (ref 0.3–1.2)
Total Protein: 9.1 g/dL — ABNORMAL HIGH (ref 6.5–8.1)

## 2020-09-21 LAB — MAGNESIUM: Magnesium: 1.9 mg/dL (ref 1.7–2.4)

## 2020-09-21 LAB — CBG MONITORING, ED: Glucose-Capillary: 227 mg/dL — ABNORMAL HIGH (ref 70–99)

## 2020-09-21 LAB — TSH: TSH: 2.837 u[IU]/mL (ref 0.350–4.500)

## 2020-09-21 MED ORDER — PROMETHAZINE HCL 25 MG/ML IJ SOLN
25.0000 mg | Freq: Once | INTRAMUSCULAR | Status: AC
  Administered 2020-09-21: 25 mg via INTRAVENOUS
  Filled 2020-09-21: qty 1

## 2020-09-21 MED ORDER — POTASSIUM CHLORIDE 10 MEQ/100ML IV SOLN
10.0000 meq | Freq: Once | INTRAVENOUS | Status: AC
  Administered 2020-09-21: 10 meq via INTRAVENOUS
  Filled 2020-09-21: qty 100

## 2020-09-21 MED ORDER — IOHEXOL 300 MG/ML  SOLN
100.0000 mL | Freq: Once | INTRAMUSCULAR | Status: AC | PRN
  Administered 2020-09-21: 100 mL via INTRAVENOUS

## 2020-09-21 MED ORDER — SODIUM CHLORIDE 0.9 % IV BOLUS
1000.0000 mL | Freq: Once | INTRAVENOUS | Status: AC
  Administered 2020-09-21: 1000 mL via INTRAVENOUS

## 2020-09-21 MED ORDER — LORAZEPAM 2 MG/ML IJ SOLN
1.0000 mg | Freq: Once | INTRAMUSCULAR | Status: AC
  Administered 2020-09-21: 1 mg via INTRAVENOUS
  Filled 2020-09-21: qty 1

## 2020-09-21 MED ORDER — METOPROLOL TARTRATE 5 MG/5ML IV SOLN
5.0000 mg | Freq: Once | INTRAVENOUS | Status: AC
  Administered 2020-09-21: 5 mg via INTRAVENOUS
  Filled 2020-09-21: qty 5

## 2020-09-21 NOTE — ED Provider Notes (Signed)
[facility] EMERGENCY DEPARTMENT Provider Note   CSN: [id number] Arrival date & time: 09/21/20  1807     History Chief Complaint  Patient presents with  . Palpitations    Theresa Malone is a 39 y.o. female.  Pt presents to the ED today with n/v and palpitations.  Pt said she was admitted to [facility] from 10/23-25 for hyperglycemia and AKI.  Pt has been unable to keep down any of her meds.  She feels like her heart is racing.         Past Medical History:  Diagnosis Date  . Anxiety    panic attacks  . Cellulitis   . Depression   . Diabetes mellitus   . Gout   . Hypertension   . Obesity   . Venous stasis   . Venous stasis dermatitis     Patient Active Problem List   Diagnosis Date Noted  . Sepsis without acute organ dysfunction (HCC)   . Acute pyelonephritis 09/22/2020  . Pyelonephritis 09/22/2020  . Emesis, persistent 05/21/2019  . Cellulitis   . Intractable vomiting with nausea 04/13/2019  . Acute respiratory alkalosis   . Intractable vomiting   . Chest pain   . Cellulitis of right lower extremity 04/12/2019  . Intractable abdominal pain 04/11/2019  . Metabolic alkalosis 04/11/2019  . Respiratory alkalosis 04/11/2019  . Volume depletion 04/11/2019  . Hypokalemia 04/11/2019  . Hyponatremia 04/11/2019  . Yeast vaginitis 03/13/2018  . Type 2 diabetes mellitus, uncontrolled 02/07/2016  . Chronic hypertension 02/07/2016  . Menorrhagia with regular cycle 02/07/2016  . Frequent loose stools 02/07/2016  . Status post cesarean section 01/25/2015  . Hidradenitis axillaris 09/15/2014  . Susceptible to varicella (non-immune), currently pregnant 07/24/2014  . Morbid obesity with BMI of 45.0-49.9, adult (HCC) 03/03/2014    Past Surgical History:  Procedure Laterality Date  . CESAREAN SECTION     C/S x 2  . CESAREAN SECTION N/A 01/24/2015   Procedure: CESAREAN SECTION;  Surgeon: Catalina Antigua, MD;  Location: [facility];  Service: Obstetrics;  Laterality: N/A;  .  TUBAL LIGATION       OB History    Gravida  4   Para  4   Term  2   Preterm  2   AB      Living  4     SAB      TAB      Ectopic      Multiple  0   Live Births  4           Family History  Problem Relation Age of Onset  . Diabetes Paternal Grandfather   . Cancer Paternal Grandfather        liver  . Cancer Paternal Grandmother        liver  . Hypertension Father   . Diabetes Mother   . Hypertension Mother   . Diabetes Maternal Uncle   . Diabetes Maternal Grandmother   . Cancer Maternal Grandfather   . Diabetes Daughter        boarderline   . Asthma Daughter   . Bronchitis Daughter   . Bronchitis Son   . Bronchitis Daughter     Social History   Tobacco Use  . Smoking status: Never Smoker  . Smokeless tobacco: Never Used  Vaping Use  . Vaping Use: Never used  Substance Use Topics  . Alcohol use: Yes    Comment: wine occ  . Drug use: No    Home  Medications Prior to Admission medications   Medication Sig Start Date End Date Taking? Authorizing Provider  acetaminophen (TYLENOL) 325 MG tablet Take 2 tablets (650 mg total) by mouth every 6 (six) hours as needed for mild pain, fever or headache (or Fever >/= 101). 05/22/19  Yes Emokpae, Courage, MD  gabapentin (NEURONTIN) 300 MG capsule Take 300 mg by mouth 2 (two) times daily.   Yes [provider]  insulin glargine (LANTUS SOLOSTAR) 100 UNIT/ML Solostar Pen Inject 50 Units into the skin in the morning and at bedtime.   Yes [provider]  LIDODERM 5 % Place 1 patch onto the skin daily.  08/19/20  Yes [provider]  liraglutide (VICTOZA) 18 MG/3ML SOPN Inject 1.2 mg into the skin in the morning.    Yes [provider]  LORazepam (ATIVAN) 1 MG tablet Take 1 tablet (1 mg total) by mouth 3 (three) times daily as needed for anxiety. 05/20/19  Yes Jacalyn Lefevre, MD  metFORMIN (GLUCOPHAGE) 1000 MG tablet Take 1 tablet (1,000 mg total) by mouth 2 (two) times daily with  a meal. 05/22/19 09/22/20 Yes Emokpae, Courage, MD  traZODone (DESYREL) 100 MG tablet Take 1 tablet (100 mg total) by mouth at bedtime as needed for sleep. 05/22/19  Yes Emokpae, Courage, MD  cefdinir (OMNICEF) 300 MG capsule Take 1 capsule (300 mg total) by mouth 2 (two) times daily for 5 days. 09/23/20 09/28/20  Vassie Loll, MD  FLUoxetine (PROZAC) 10 MG capsule Take 1 capsule (10 mg total) by mouth daily. Patient not taking: Reported on 09/22/2020 04/16/19   Catarina Hartshorn, MD  losartan (COZAAR) 50 MG tablet Take 1 tablet (50 mg total) by mouth daily. 09/26/20   Vassie Loll, MD  metoCLOPramide (REGLAN) 10 MG tablet Take 1 tablet (10 mg total) by mouth every 8 (eight) hours as needed for refractory nausea / vomiting (headache). 09/23/20   Vassie Loll, MD  metoprolol tartrate (LOPRESSOR) 25 MG tablet Take 1 tablet (25 mg total) by mouth 2 (two) times daily. 09/23/20   Vassie Loll, MD  ondansetron (ZOFRAN ODT) 4 MG disintegrating tablet Take 1 tablet (4 mg total) by mouth every 8 (eight) hours as needed for nausea or vomiting. 09/23/20   Vassie Loll, MD  pantoprazole (PROTONIX) 40 MG tablet Take 1 tablet (40 mg total) by mouth daily. 05/22/19 05/21/20  Shon Hale, MD  polyethylene glycol (MIRALAX / GLYCOLAX) 17 g packet Take 17 g by mouth daily as needed for mild constipation. Patient not taking: Reported on 09/22/2020 05/22/19   Shon Hale, MD  triamterene-hydrochlorothiazide (MAXZIDE) 75-50 MG tablet Take 1 tablet by mouth daily. 09/26/20   Vassie Loll, MD  clonazepam (KLONOPIN) 0.125 MG disintegrating tablet Take 1 tablet (0.125 mg total) by mouth 2 (two) times daily. Patient not taking: Reported on 05/19/2019 04/15/19 05/21/19  Catarina Hartshorn, MD    Allergies    Lisinopril  Review of Systems   Review of Systems  Respiratory: Positive for shortness of breath.   Cardiovascular: Positive for palpitations.  Gastrointestinal: Positive for nausea and vomiting.  All other systems  reviewed and are negative.   Physical Exam Updated Vital Signs BP (!) 138/95 (BP Location: Left Arm)   Pulse 100   Temp 98.6 F (37 C) (Oral)   Resp 18   Ht 5\' 1"  (1.549 m)   Wt 114.3 kg   SpO2 100%   BMI 47.61 kg/m   Physical Exam Vitals and nursing note reviewed.  Constitutional:  Appearance: Normal appearance. She is obese. She is ill-appearing.  HENT:     Head: Normocephalic and atraumatic.     Right Ear: External ear normal.     Left Ear: External ear normal.     Nose: Nose normal.     Mouth/Throat:     Mouth: Mucous membranes are dry.  Eyes:     Extraocular Movements: Extraocular movements intact.     Conjunctiva/sclera: Conjunctivae normal.     Pupils: Pupils are equal, round, and reactive to light.  Cardiovascular:     Rate and Rhythm: Regular rhythm. Tachycardia present.     Pulses: Normal pulses.     Heart sounds: Normal heart sounds.  Pulmonary:     Effort: Tachypnea present.     Breath sounds: Normal breath sounds.  Abdominal:     General: Abdomen is flat. Bowel sounds are normal.     Palpations: Abdomen is soft.     Tenderness: There is generalized abdominal tenderness.  Musculoskeletal:        General: Normal range of motion.     Cervical back: Normal range of motion and neck supple.  Skin:    General: Skin is warm.     Capillary Refill: Capillary refill takes less than 2 seconds.  Neurological:     General: No focal deficit present.     Mental Status: She is alert and oriented to person, place, and time.  Psychiatric:        Mood and Affect: Mood normal.        Behavior: Behavior normal.     ED Results / Procedures / Treatments   Labs (all labs ordered are listed, but only abnormal results are displayed) Labs Reviewed  URINE CULTURE - Abnormal; Notable for the following components:      Result Value   Culture MULTIPLE SPECIES PRESENT, SUGGEST RECOLLECTION (*)    All other components within normal limits  CBC - Abnormal; Notable for  the following components:   WBC 13.0 (*)    RBC 3.65 (*)    Hemoglobin 9.2 (*)    HCT 30.2 (*)    MCH 25.2 (*)    RDW 17.1 (*)    Platelets 435 (*)    All other components within normal limits  COMPREHENSIVE METABOLIC PANEL - Abnormal; Notable for the following components:   Sodium 132 (*)    Potassium 2.7 (*)    Chloride 94 (*)    Glucose, Bld 253 (*)    BUN 22 (*)    Creatinine, Ser 1.34 (*)    Total Protein 9.1 (*)    Albumin 2.8 (*)    AST 11 (*)    GFR, Estimated 53 (*)    All other components within normal limits  URINALYSIS, ROUTINE W REFLEX MICROSCOPIC - Abnormal; Notable for the following components:   APPearance HAZY (*)    Hgb urine dipstick LARGE (*)    Ketones, ur 5 (*)    Protein, ur 30 (*)    Leukocytes,Ua TRACE (*)    Bacteria, UA RARE (*)    All other components within normal limits  BASIC METABOLIC PANEL - Abnormal; Notable for the following components:   Sodium 131 (*)    Potassium 3.4 (*)    Chloride 97 (*)    Glucose, Bld 266 (*)    Creatinine, Ser 1.26 (*)    Calcium 8.3 (*)    GFR, Estimated 57 (*)    All other components within normal limits  CBC -  Abnormal; Notable for the following components:   WBC 12.9 (*)    RBC 2.92 (*)    Hemoglobin 7.3 (*)    HCT 24.0 (*)    MCH 25.0 (*)    RDW 17.0 (*)    All other components within normal limits  GLUCOSE, CAPILLARY - Abnormal; Notable for the following components:   Glucose-Capillary 197 (*)    All other components within normal limits  GLUCOSE, CAPILLARY - Abnormal; Notable for the following components:   Glucose-Capillary 232 (*)    All other components within normal limits  GLUCOSE, CAPILLARY - Abnormal; Notable for the following components:   Glucose-Capillary 210 (*)    All other components within normal limits  GLUCOSE, CAPILLARY - Abnormal; Notable for the following components:   Glucose-Capillary 219 (*)    All other components within normal limits  CBG MONITORING, ED - Abnormal;  Notable for the following components:   Glucose-Capillary 227 (*)    All other components within normal limits  CBG MONITORING, ED - Abnormal; Notable for the following components:   Glucose-Capillary 169 (*)    All other components within normal limits  RESPIRATORY PANEL BY RT PCR (FLU A&B, COVID)  TSH  MAGNESIUM  HCG, QUANTITATIVE, PREGNANCY  HIV ANTIBODY (ROUTINE TESTING W REFLEX)  MAGNESIUM  PHOSPHORUS  TSH  TROPONIN I (HIGH SENSITIVITY)  TROPONIN I (HIGH SENSITIVITY)    EKG EKG Interpretation  Date/Time:  Wednesday September 21 2020 18:13:52 EDT Ventricular Rate:  139 PR Interval:  126 QRS Duration: 68 QT Interval:  296 QTC Calculation: 450 R Axis:   17 Text Interpretation: Sinus tachycardia Otherwise normal ECG Since last tracing rate faster Confirmed by Jacalyn Lefevre [phone]) on 09/21/2020 8:35:40 PM   Radiology No results found.  Procedures Procedures (including critical care time)  Medications Ordered in ED Medications  0.9 % NaCl with KCl 40 mEq / L  infusion ( Intravenous Not Given 09/22/20 1021)  0.9 % NaCl with KCl 40 mEq / L  infusion (0 mL/hr Intravenous Stopped 09/22/20 1930)  sodium chloride 0.9 % bolus 1,000 mL (0 mLs Intravenous Stopped 09/21/20 2323)  promethazine (PHENERGAN) injection 25 mg (25 mg Intravenous Given 09/21/20 2120)  potassium chloride 10 mEq in 100 mL IVPB (0 mEq Intravenous Stopped 09/21/20 2319)  LORazepam (ATIVAN) injection 1 mg (1 mg Intravenous Given 09/21/20 2110)  iohexol (OMNIPAQUE) 300 MG/ML solution 100 mL (100 mLs Intravenous Contrast Given 09/21/20 2227)  metoprolol tartrate (LOPRESSOR) injection 5 mg (5 mg Intravenous Given 09/21/20 2323)  sodium chloride 0.9 % bolus 1,000 mL (0 mLs Intravenous Stopped 09/22/20 0137)  cefTRIAXone (ROCEPHIN) 1 g in sodium chloride 0.9 % 100 mL IVPB (0 g Intravenous Stopped 09/22/20 0106)  acetaminophen (TYLENOL) tablet 1,000 mg (1,000 mg Oral Given 09/22/20 0140)  magnesium sulfate IVPB  2 g 50 mL (0 g Intravenous Stopped 09/22/20 0452)  LORazepam (ATIVAN) injection 1 mg (1 mg Intravenous Given 09/22/20 0348)  cefTRIAXone (ROCEPHIN) 1 g in sodium chloride 0.9 % 100 mL IVPB (0 g Intravenous Stopped 09/22/20 0523)  magnesium sulfate IVPB 1 g 100 mL (0 g Intravenous Stopped 09/22/20 1123)    ED Course  I have reviewed the triage vital signs and the nursing notes.  Pertinent labs & imaging results that were available during my care of the patient were reviewed by me and considered in my medical decision making (see chart for details).    MDM Rules/Calculators/A&P  Pt given fluids and nausea meds.  She is hypokalemic and is given potassium. HR has improved, but it is still high.  She's been unable to take her bp meds, so she was given 5 mg of lopressor IV.  She is also given more fluids.  Pt's CT and UA are pending at shift change.  Pt signed out to Dr. Erin Hearing.  Final Clinical Impression(s) / ED Diagnoses Final diagnoses:  Hypokalemia  Pyelonephritis    Rx / DC Orders ED Discharge Orders         Ordered    losartan (COZAAR) 50 MG tablet  Daily        09/23/20 1617    triamterene-hydrochlorothiazide (MAXZIDE) 75-50 MG tablet  Daily        09/23/20 1617    cefdinir (OMNICEF) 300 MG capsule  2 times daily        09/23/20 1617    metoprolol tartrate (LOPRESSOR) 25 MG tablet  2 times daily        09/23/20 1617    metoCLOPramide (REGLAN) 10 MG tablet  Every 8 hours PRN        09/23/20 1617    ondansetron (ZOFRAN ODT) 4 MG disintegrating tablet  Every 8 hours PRN        09/23/20 1617    Diet - low sodium heart healthy        09/23/20 1617    Discharge instructions       Comments: Follow modified carbohydrate diet, low sodium and low calorie. -Small meals multiple times a day to facilitate education and minimizing the chances of nausea, vomiting or reflux symptoms. Take medications as prescribed Arrange follow-up with PCP in 10 days Maintain  adequate hydration   09/23/20 1617    Diet Carb Modified        09/23/20 1617           Jacalyn Lefevre, MD 09/24/20 [phone]

## 2020-09-21 NOTE — ED Notes (Signed)
Reminded patient that urine specimen is needed. Patient states that she is too drowsy to attempt at this time.

## 2020-09-21 NOTE — ED Notes (Signed)
Patient transported to CT 

## 2020-09-21 NOTE — ED Triage Notes (Signed)
Pt to er, pt states that she is here because she has been sick for the past week, states that she had a negative covid test on Friday, states that she is here was admitted at [facility] because she had high blood sugar.  Pt states that she is here for palpitations.

## 2020-09-22 ENCOUNTER — Encounter (HOSPITAL_COMMUNITY): Payer: Self-pay | Admitting: Internal Medicine

## 2020-09-22 DIAGNOSIS — K219 Gastro-esophageal reflux disease without esophagitis: Secondary | ICD-10-CM | POA: Diagnosis present

## 2020-09-22 DIAGNOSIS — Z6841 Body Mass Index (BMI) 40.0 and over, adult: Secondary | ICD-10-CM | POA: Diagnosis not present

## 2020-09-22 DIAGNOSIS — E119 Type 2 diabetes mellitus without complications: Secondary | ICD-10-CM

## 2020-09-22 DIAGNOSIS — A419 Sepsis, unspecified organism: Secondary | ICD-10-CM | POA: Diagnosis present

## 2020-09-22 DIAGNOSIS — N10 Acute tubulo-interstitial nephritis: Secondary | ICD-10-CM | POA: Diagnosis present

## 2020-09-22 DIAGNOSIS — I10 Essential (primary) hypertension: Secondary | ICD-10-CM

## 2020-09-22 DIAGNOSIS — Z8249 Family history of ischemic heart disease and other diseases of the circulatory system: Secondary | ICD-10-CM | POA: Diagnosis not present

## 2020-09-22 DIAGNOSIS — Z79899 Other long term (current) drug therapy: Secondary | ICD-10-CM | POA: Diagnosis not present

## 2020-09-22 DIAGNOSIS — R112 Nausea with vomiting, unspecified: Secondary | ICD-10-CM

## 2020-09-22 DIAGNOSIS — Z825 Family history of asthma and other chronic lower respiratory diseases: Secondary | ICD-10-CM | POA: Diagnosis not present

## 2020-09-22 DIAGNOSIS — Z833 Family history of diabetes mellitus: Secondary | ICD-10-CM | POA: Diagnosis not present

## 2020-09-22 DIAGNOSIS — E871 Hypo-osmolality and hyponatremia: Secondary | ICD-10-CM | POA: Diagnosis present

## 2020-09-22 DIAGNOSIS — Z794 Long term (current) use of insulin: Secondary | ICD-10-CM | POA: Diagnosis not present

## 2020-09-22 DIAGNOSIS — N12 Tubulo-interstitial nephritis, not specified as acute or chronic: Secondary | ICD-10-CM | POA: Diagnosis present

## 2020-09-22 DIAGNOSIS — E1143 Type 2 diabetes mellitus with diabetic autonomic (poly)neuropathy: Secondary | ICD-10-CM | POA: Diagnosis present

## 2020-09-22 DIAGNOSIS — F419 Anxiety disorder, unspecified: Secondary | ICD-10-CM | POA: Diagnosis present

## 2020-09-22 DIAGNOSIS — N179 Acute kidney failure, unspecified: Secondary | ICD-10-CM | POA: Diagnosis present

## 2020-09-22 DIAGNOSIS — E876 Hypokalemia: Secondary | ICD-10-CM | POA: Diagnosis present

## 2020-09-22 DIAGNOSIS — K3184 Gastroparesis: Secondary | ICD-10-CM | POA: Diagnosis present

## 2020-09-22 DIAGNOSIS — Z20822 Contact with and (suspected) exposure to covid-19: Secondary | ICD-10-CM | POA: Diagnosis present

## 2020-09-22 DIAGNOSIS — E1165 Type 2 diabetes mellitus with hyperglycemia: Secondary | ICD-10-CM | POA: Diagnosis present

## 2020-09-22 DIAGNOSIS — E860 Dehydration: Secondary | ICD-10-CM | POA: Diagnosis present

## 2020-09-22 DIAGNOSIS — Z7984 Long term (current) use of oral hypoglycemic drugs: Secondary | ICD-10-CM | POA: Diagnosis not present

## 2020-09-22 LAB — URINALYSIS, ROUTINE W REFLEX MICROSCOPIC
Bilirubin Urine: NEGATIVE
Glucose, UA: NEGATIVE mg/dL
Ketones, ur: 5 mg/dL — AB
Nitrite: NEGATIVE
Protein, ur: 30 mg/dL — AB
Specific Gravity, Urine: 1.029 (ref 1.005–1.030)
pH: 6 (ref 5.0–8.0)

## 2020-09-22 LAB — CBG MONITORING, ED: Glucose-Capillary: 169 mg/dL — ABNORMAL HIGH (ref 70–99)

## 2020-09-22 LAB — GLUCOSE, CAPILLARY
Glucose-Capillary: 197 mg/dL — ABNORMAL HIGH (ref 70–99)
Glucose-Capillary: 232 mg/dL — ABNORMAL HIGH (ref 70–99)

## 2020-09-22 LAB — MAGNESIUM: Magnesium: 2.4 mg/dL (ref 1.7–2.4)

## 2020-09-22 LAB — TSH: TSH: 3.007 u[IU]/mL (ref 0.350–4.500)

## 2020-09-22 LAB — RESPIRATORY PANEL BY RT PCR (FLU A&B, COVID)
Influenza A by PCR: NEGATIVE
Influenza B by PCR: NEGATIVE
SARS Coronavirus 2 by RT PCR: NEGATIVE

## 2020-09-22 LAB — PHOSPHORUS: Phosphorus: 3.5 mg/dL (ref 2.5–4.6)

## 2020-09-22 LAB — HIV ANTIBODY (ROUTINE TESTING W REFLEX): HIV Screen 4th Generation wRfx: NONREACTIVE

## 2020-09-22 LAB — HCG, QUANTITATIVE, PREGNANCY: hCG, Beta Chain, Quant, S: <1 m[IU]/mL (ref <5)

## 2020-09-22 MED ORDER — PANTOPRAZOLE SODIUM 40 MG PO TBEC
40.0000 mg | DELAYED_RELEASE_TABLET | Freq: Every day | ORAL | Status: DC
  Administered 2020-09-22 – 2020-09-23 (×2): 40 mg via ORAL
  Filled 2020-09-22 (×2): qty 1

## 2020-09-22 MED ORDER — LORAZEPAM 2 MG/ML IJ SOLN
1.0000 mg | Freq: Once | INTRAMUSCULAR | Status: AC
  Administered 2020-09-22: 1 mg via INTRAVENOUS
  Filled 2020-09-22: qty 1

## 2020-09-22 MED ORDER — MAGNESIUM SULFATE 50 % IJ SOLN: 1.0000 g | Freq: Once | INTRAMUSCULAR | Status: DC

## 2020-09-22 MED ORDER — PROMETHAZINE HCL 12.5 MG PO TABS: 12.5000 mg | ORAL_TABLET | Freq: Four times a day (QID) | ORAL | Status: DC | PRN

## 2020-09-22 MED ORDER — METOPROLOL TARTRATE 25 MG PO TABS
25.0000 mg | ORAL_TABLET | Freq: Two times a day (BID) | ORAL | Status: DC
  Administered 2020-09-22 – 2020-09-23 (×3): 25 mg via ORAL
  Filled 2020-09-22 (×3): qty 1

## 2020-09-22 MED ORDER — METOCLOPRAMIDE HCL 5 MG/ML IJ SOLN
5.0000 mg | Freq: Three times a day (TID) | INTRAMUSCULAR | Status: DC
  Administered 2020-09-22 – 2020-09-23 (×4): 5 mg via INTRAVENOUS
  Filled 2020-09-22 (×4): qty 2

## 2020-09-22 MED ORDER — ONDANSETRON HCL 4 MG PO TABS: 4.0000 mg | ORAL_TABLET | Freq: Four times a day (QID) | ORAL | Status: DC | PRN

## 2020-09-22 MED ORDER — ACETAMINOPHEN 650 MG RE SUPP: 650.0000 mg | Freq: Four times a day (QID) | RECTAL | Status: DC | PRN

## 2020-09-22 MED ORDER — LACTATED RINGERS IV BOLUS: 1000.0000 mL | Freq: Once | INTRAVENOUS | Status: DC

## 2020-09-22 MED ORDER — ACETAMINOPHEN 325 MG PO TABS
650.0000 mg | ORAL_TABLET | Freq: Four times a day (QID) | ORAL | Status: DC | PRN
  Administered 2020-09-22: 650 mg via ORAL
  Filled 2020-09-22: qty 2

## 2020-09-22 MED ORDER — HEPARIN SODIUM (PORCINE) 5000 UNIT/ML IJ SOLN
5000.0000 [IU] | Freq: Three times a day (TID) | INTRAMUSCULAR | Status: DC
  Administered 2020-09-22 – 2020-09-23 (×4): 5000 [IU] via SUBCUTANEOUS
  Filled 2020-09-22 (×4): qty 1

## 2020-09-22 MED ORDER — SODIUM CHLORIDE 0.9 % IV SOLN
2.0000 g | INTRAVENOUS | Status: DC
  Administered 2020-09-23: 2 g via INTRAVENOUS
  Filled 2020-09-22: qty 20

## 2020-09-22 MED ORDER — INSULIN GLARGINE 100 UNIT/ML ~~LOC~~ SOLN
30.0000 [IU] | Freq: Two times a day (BID) | SUBCUTANEOUS | Status: DC
  Administered 2020-09-22 – 2020-09-23 (×3): 30 [IU] via SUBCUTANEOUS
  Filled 2020-09-22 (×9): qty 0.3

## 2020-09-22 MED ORDER — ONDANSETRON HCL 4 MG/2ML IJ SOLN
4.0000 mg | Freq: Four times a day (QID) | INTRAMUSCULAR | Status: DC | PRN
  Administered 2020-09-23: 4 mg via INTRAVENOUS
  Filled 2020-09-22: qty 2

## 2020-09-22 MED ORDER — INSULIN ASPART 100 UNIT/ML ~~LOC~~ SOLN
0.0000 [IU] | Freq: Three times a day (TID) | SUBCUTANEOUS | Status: DC
  Administered 2020-09-22 (×2): 3 [IU] via SUBCUTANEOUS
  Administered 2020-09-23 (×2): 5 [IU] via SUBCUTANEOUS
  Filled 2020-09-22: qty 1

## 2020-09-22 MED ORDER — GABAPENTIN 300 MG PO CAPS
300.0000 mg | ORAL_CAPSULE | Freq: Two times a day (BID) | ORAL | Status: DC
  Administered 2020-09-22 – 2020-09-23 (×3): 300 mg via ORAL
  Filled 2020-09-22 (×3): qty 1

## 2020-09-22 MED ORDER — SODIUM CHLORIDE 0.9 % IV SOLN
1.0000 g | Freq: Once | INTRAVENOUS | Status: AC
  Administered 2020-09-22: 1 g via INTRAVENOUS
  Filled 2020-09-22: qty 10

## 2020-09-22 MED ORDER — POTASSIUM CHLORIDE IN NACL 40-0.9 MEQ/L-% IV SOLN
INTRAVENOUS | Status: AC
  Filled 2020-09-22: qty 1000

## 2020-09-22 MED ORDER — ACETAMINOPHEN 500 MG PO TABS
1000.0000 mg | ORAL_TABLET | Freq: Once | ORAL | Status: AC
  Administered 2020-09-22: 1000 mg via ORAL
  Filled 2020-09-22: qty 2

## 2020-09-22 MED ORDER — TRAZODONE HCL 50 MG PO TABS
100.0000 mg | ORAL_TABLET | Freq: Every evening | ORAL | Status: DC | PRN
  Administered 2020-09-22: 100 mg via ORAL
  Filled 2020-09-22: qty 2

## 2020-09-22 MED ORDER — MAGNESIUM SULFATE 2 GM/50ML IV SOLN
2.0000 g | Freq: Once | INTRAVENOUS | Status: AC
  Administered 2020-09-22: 2 g via INTRAVENOUS
  Filled 2020-09-22: qty 50

## 2020-09-22 MED ORDER — SODIUM CHLORIDE 0.9 % IV SOLN
1.0000 g | INTRAVENOUS | Status: AC
  Administered 2020-09-22: 1 g via INTRAVENOUS
  Filled 2020-09-22: qty 10

## 2020-09-22 MED ORDER — INSULIN ASPART 100 UNIT/ML ~~LOC~~ SOLN
0.0000 [IU] | Freq: Every day | SUBCUTANEOUS | Status: DC
  Administered 2020-09-22: 2 [IU] via SUBCUTANEOUS

## 2020-09-22 MED ORDER — MAGNESIUM SULFATE IN D5W 1-5 GM/100ML-% IV SOLN
1.0000 g | Freq: Once | INTRAVENOUS | Status: AC
  Administered 2020-09-22: 1 g via INTRAVENOUS
  Filled 2020-09-22: qty 100

## 2020-09-22 MED ORDER — POTASSIUM CHLORIDE IN NACL 40-0.9 MEQ/L-% IV SOLN
INTRAVENOUS | Status: AC
  Filled 2020-09-22 (×2): qty 1000

## 2020-09-22 NOTE — H&P (Signed)
History and Physical    Theresa Malone ZOX:[id number] DOB: 07-07-1981 DOA: 09/21/2020  [facility]   Patient coming from: home  I have personally briefly reviewed patient's old medical records in [facility]  Chief Complaint: abd pain, nausea and vomiting  HPI: Theresa BoutonKelly R Headrick is a 39 y.o. female with medical history significant of obesity, HTN, uncontrolled diabetes, depression, GERD and presumed gastroparesis; who presented to ED with complaints of ongoing nausea/vomiting and abd pain. Patient expressed increased frequency, palpitations and inability to keep things down. She denies fever, chills, hematemsis, melena, hematuria, CP, focal weakness or any other concerns. Of note, she was [facility] hyperglycemia (09-17-20 >>09/19/20) and expressed that for the last 2 days has not been able to keep her medications down.  ED Course: patient found to be in sepsis with :tachycardia, elevated WBC's, elevated RR and AKI. CT scan abdomen demonstrating pyelonephritis changes and abnormal UA. Patient actively experiencing N/V and with profound hypokalemia; TRH called for admission.  Review of Systems: As per HPI otherwise all other systems reviewed and are negative.   Past Medical History:  Diagnosis Date  . Anxiety    panic attacks  . Cellulitis   . Depression   . Diabetes mellitus   . Gout   . Hypertension   . Obesity   . Venous stasis   . Venous stasis dermatitis     Past Surgical History:  Procedure Laterality Date  . CESAREAN SECTION     C/S x 2  . CESAREAN SECTION N/A 01/24/2015   Procedure: CESAREAN SECTION;  Surgeon: Catalina AntiguaPeggy Constant, MD;  Location: [facility];  Service: Obstetrics;  Laterality: N/A;  . TUBAL LIGATION      Social History  reports that she has never smoked. She has never used smokeless tobacco. She reports current alcohol use. She reports that she does not use drugs.  Allergies  Allergen Reactions  .  Lisinopril Cough    cough    Family History  Problem Relation Age of Onset  . Diabetes Paternal Grandfather   . Cancer Paternal Grandfather        liver  . Cancer Paternal Grandmother        liver  . Hypertension Father   . Diabetes Mother   . Hypertension Mother   . Diabetes Maternal Uncle   . Diabetes Maternal Grandmother   . Cancer Maternal Grandfather   . Diabetes Daughter        boarderline   . Asthma Daughter   . Bronchitis Daughter   . Bronchitis Son   . Bronchitis Daughter     Prior to Admission medications   Medication Sig Start Date End Date Taking? Authorizing Provider  acetaminophen (TYLENOL) 325 MG tablet Take 2 tablets (650 mg total) by mouth every 6 (six) hours as needed for mild pain, fever or headache (or Fever >/= 101). 05/22/19  Yes Emokpae, Courage, MD  gabapentin (NEURONTIN) 300 MG capsule Take 300 mg by mouth 2 (two) times daily.   Yes [provider]  indomethacin (INDOCIN) 25 MG capsule Take 50 mg by mouth 3 (three) times daily as needed for mild pain.   Yes [provider]  insulin glargine (LANTUS SOLOSTAR) 100 UNIT/ML Solostar Pen Inject 50 Units into the skin in the morning and at bedtime.   Yes [provider]  LIDODERM 5 % Place 1 patch onto the skin daily.  08/19/20  Yes [provider]  liraglutide Verdis Prime(VICTOZA)  18 MG/3ML SOPN Inject 1.2 mg into the skin in the morning.    Yes [provider]  LORazepam (ATIVAN) 1 MG tablet Take 1 tablet (1 mg total) by mouth 3 (three) times daily as needed for anxiety. 05/20/19  Yes Jacalyn Lefevre, MD  losartan (COZAAR) 50 MG tablet Take 1 tablet (50 mg total) by mouth daily. 05/22/19  Yes Shon Hale, MD  metFORMIN (GLUCOPHAGE) 1000 MG tablet Take 1 tablet (1,000 mg total) by mouth 2 (two) times daily with a meal. 05/22/19 09/22/20 Yes Emokpae, Courage, MD  promethazine (PHENERGAN) 12.5 MG tablet Take 12.5 mg by mouth every 6 (six) hours as needed for nausea or vomiting.    Yes [provider]  traZODone (DESYREL) 100 MG tablet Take 1 tablet (100 mg total) by mouth at bedtime as needed for sleep. 05/22/19  Yes Emokpae, Courage, MD  triamterene-hydrochlorothiazide (MAXZIDE) 75-50 MG tablet Take 1 tablet by mouth daily.   Yes [provider]  amLODipine (NORVASC) 5 MG tablet Take 1 tablet (5 mg total) by mouth daily. 05/22/19 05/21/20  Shon Hale, MD  FLUoxetine (PROZAC) 10 MG capsule Take 1 capsule (10 mg total) by mouth daily. Patient not taking: Reported on 09/22/2020 04/16/19   Catarina Hartshorn, MD  metoCLOPramide (REGLAN) 10 MG tablet Take 1 tablet (10 mg total) by mouth every 6 (six) hours as needed for nausea (nausea/headache). Patient not taking: Reported on 09/22/2020 05/21/19   Mesner, Barbara Cower, MD  ondansetron (ZOFRAN ODT) 4 MG disintegrating tablet Take 1 tablet (4 mg total) by mouth every 8 (eight) hours as needed for nausea or vomiting. Patient not taking: Reported on 09/22/2020 05/22/19   Shon Hale, MD  pantoprazole (PROTONIX) 40 MG tablet Take 1 tablet (40 mg total) by mouth daily. 05/22/19 05/21/20  Shon Hale, MD  polyethylene glycol (MIRALAX / GLYCOLAX) 17 g packet Take 17 g by mouth daily as needed for mild constipation. Patient not taking: Reported on 09/22/2020 05/22/19   Shon Hale, MD  clonazepam (KLONOPIN) 0.125 MG disintegrating tablet Take 1 tablet (0.125 mg total) by mouth 2 (two) times daily. Patient not taking: Reported on 05/19/2019 04/15/19 05/21/19  Catarina Hartshorn, MD    Physical Exam: Vitals:   09/22/20 0715 09/22/20 0730 09/22/20 0745 09/22/20 0800  BP:  (!) 145/84  (!) 149/101  Pulse: (!) 103 (!) 216 (!) 107 (!) 114  Resp:      Temp:      TempSrc:      SpO2: 100% 98% 98% 100%  Weight:      Height:        Constitutional: mild distress due to ongoing nausea; no fever. Vitals:   09/22/20 0715 09/22/20 0730 09/22/20 0745 09/22/20 0800  BP:  (!) 145/84  (!) 149/101  Pulse: (!) 103 (!) 216 (!) 107 (!) 114    Resp:      Temp:      TempSrc:      SpO2: 100% 98% 98% 100%  Weight:      Height:       Eyes: PERRL, lids and conjunctivae normal; no icterus, no nystagmus  ENMT: Mucous membranes are dry. Posterior pharynx clear of any exudate or lesions. Neck: normal, supple, no masses, no thyromegaly; unable to assess JVD with body habitus. Respiratory: clear to auscultation bilaterally, no wheezing, no crackles. Positive tachypnea appreciated. No using accessory muscles. Cardiovascular: sinus tachycardia, no rubs, no gallops. Regular rate and rhythm, no murmurs / rubs / gallops.  Abdomen: obese, no tenderness, no  masses palpated. No hepatosplenomegaly. Bowel sounds positive.  Musculoskeletal: no clubbing / cyanosis. No joint deformity upper and lower extremities. Good ROM, no contractures. Normal muscle tone.  Skin: no rashes, no petechiae. Neurologic: CN 2-12 grossly intact. Sensation intact, DTR normal. Strength 5/5 in all 4.  Psychiatric: Normal judgment and insight. Alert and oriented x 3. Normal mood.   Labs on Admission: I have personally reviewed following labs and imaging studies  CBC: Recent Labs  Lab 09/21/20 1854  WBC 13.0*  HGB 9.2*  HCT 30.2*  MCV 82.7  PLT 435*    Basic Metabolic Panel: Recent Labs  Lab 09/21/20 1854 09/21/20 1902  NA  --  132*  K  --  2.7*  CL  --  94*  CO2  --  23  GLUCOSE  --  253*  BUN  --  22*  CREATININE  --  1.34*  CALCIUM  --  9.3  MG 1.9  --     GFR: Estimated Creatinine Clearance: 68.2 mL/min (A) (by C-G formula based on SCr of 1.34 mg/dL (H)).  Liver Function Tests: Recent Labs  Lab 09/21/20 1902  AST 11*  ALT 14  ALKPHOS 121  BILITOT 1.1  PROT 9.1*  ALBUMIN 2.8*    Urine analysis:    Component Value Date/Time   COLORURINE YELLOW 09/22/2020 0001   APPEARANCEUR HAZY (A) 09/22/2020 0001   LABSPEC 1.029 09/22/2020 0001   PHURINE 6.0 09/22/2020 0001   GLUCOSEU NEGATIVE 09/22/2020 0001   HGBUR LARGE (A) 09/22/2020 0001    BILIRUBINUR NEGATIVE 09/22/2020 0001   KETONESUR 5 (A) 09/22/2020 0001   PROTEINUR 30 (A) 09/22/2020 0001   UROBILINOGEN 0.2 02/05/2015 2125   NITRITE NEGATIVE 09/22/2020 0001   LEUKOCYTESUR TRACE (A) 09/22/2020 0001    Radiological Exams on Admission: DG Chest 2 View  Result Date: 09/21/2020 CLINICAL DATA:  Chest pain cough and body ache EXAM: CHEST - 2 VIEW COMPARISON:  09/17/2020 FINDINGS: No focal opacity or pleural effusion. Cardiac size upper normal. No pneumothorax. IMPRESSION: No active cardiopulmonary disease. Electronically Signed   By: Jasmine Pang M.D.   On: 09/21/2020 18:45   CT ABDOMEN PELVIS W CONTRAST  Result Date: 09/21/2020 CLINICAL DATA:  39 year old female with abdominal pain. EXAM: CT ABDOMEN AND PELVIS WITH CONTRAST TECHNIQUE: Multidetector CT imaging of the abdomen and pelvis was performed using the standard protocol following bolus administration of intravenous contrast. CONTRAST:  OMNIPAQUE IOHEXOL 300 MG/ML  SOLN COMPARISON:  CT abdomen pelvis dated 05/21/2019. FINDINGS: Lower chest: The visualized lung bases are clear. Probable trace pericardial effusion. No intra-abdominal free air or free fluid. Hepatobiliary: The liver is unremarkable. No intrahepatic biliary ductal dilatation. The gallbladder is unremarkable. Pancreas: Unremarkable. No pancreatic ductal dilatation or surrounding inflammatory changes. Spleen: Normal in size without focal abnormality. Adrenals/Urinary Tract: The adrenal glands unremarkable. There is heterogeneous enhancement with areas of decreased enhancement of the right renal parenchyma. There is right perinephric stranding with enhancement of the right urothelium. Findings most consistent with pyelonephritis. Correlation with urinalysis recommended. No drainable fluid collection abscess. Mild fullness of the right renal pelvis without frank hydronephrosis. There is no hydronephrosis on the left. Subcentimeter left renal hypodense lesion is too  small to characterize. There is mild fullness of the right ureter. The left ureter and urinary bladder appear unremarkable. Stomach/Bowel: There is moderate stool throughout the colon. There is no bowel obstruction or active inflammation. The appendix is normal. Vascular/Lymphatic: Mild atherosclerotic calcification of the abdominal aorta. The IVC is  unremarkable. No portal venous gas. Several small but rounded retroperitoneal lymph nodes. No adenopathy. Reproductive: The uterus is anteverted. An intrauterine device is noted which is migrated inferiorly located in the endocervical region. Gynecology consult is advised. Bilateral ovarian cysts. Two adjacent cysts or a septated cyst in the left ovary with combined dimensions of 6.4 x 4.0 cm. Other: None Musculoskeletal: No acute or significant osseous findings. IMPRESSION: 1. Right-sided pyelonephritis. Correlation with urinalysis recommended. No drainable fluid collection. 2. No bowel obstruction. Normal appendix. 3. Bilateral ovarian cysts. Further evaluation with pelvic ultrasound on a nonemergent/outpatient basis recommended. 4. Malpositioned intrauterine device. Gynecology consult is advised. 5. Aortic Atherosclerosis (ICD10-I70.0). Electronically Signed   By: Elgie Collard M.D.   On: 09/21/2020 22:53    EKG: Independently reviewed. Sinus tachycardia, no acute ischemic changes.  Assessment/Plan Sepsis present on admission due to Acute pyelonephritis -IVF's per sepsis protocol provided -culture ordered -will continue IV antibiotics -PRN antiemetic and analgesics -follow clinical response  N/V -in the setting of UTI and probably underlying gastroparesis -will provide CLd and slowly advance diet as tolerated -started on reglan and PRN antiemetics -IVF's as mentioned above  Morbid obesity with BMI of 45.0-49.9, adult (HCC) -low calorie diet, portion control and increase physical activity discussed with patient. -Body mass index is 47.61  kg/m.  Type 2 diabetes mellitus, uncontrolled -will check A1C -continue SSI and reduced dose of lantus  Chronic hypertension -PRN hydralazine and IV metoprolol ordered -holding diuretics and ARB in the setting of AKI  Hypokalemia and Hyponatremia -due to GI loses and poor oral intake while continue suing diuretics. -will provide IVF's and replete electrolytes as needed -follow electrolyte trend   DVT prophylaxis: heparin Code Status:   Full code Family Communication:  No family at bedside. Disposition Plan:   Patient is from:  home  Anticipated DC to:  home  Anticipated DC date:  2-3 days  Anticipated DC barriers: Ability to tolerate PO's and resolution of abnormal electrolytes and AKI.  Consults called:  None  Admission status:  Telemetry, LOS > 2 midnights, inpatient status.  Severity of Illness: Moderate severity, will anticipate the need of > 2 midnights to fixed patient acute electrolyte derangement and AKI, while treating her with IV antibiotics for Sepsis due to pyelonephritis in the setting of acute nausea/vomiting events.   Vassie Loll MD [facility]  How to contact the [facility] Attending or Consulting provider 7A - 7P or covering provider during after hours 7P -7A, for this patient?   1. Check the care team in [facility] and look for a) attending/consulting [facility] provider listed and b) the [facility] team listed 2. Log into [email or web address] and use [facility] universal password to access. If you do not have the password, please contact the hospital operator. 3. Locate the [facility] provider you are looking for under [facility] and page to a number that you can be directly reached. 4. If you still have difficulty reaching the provider, please page the [facility] (Director on Call) for the Hospitalists listed on amion for assistance.  09/22/2020, 9:23 AM

## 2020-09-22 NOTE — ED Provider Notes (Signed)
3:41 AM Assumed care from Dr. Particia Nearing, please see their note for full history, physical and decision making until this point. In brief this is a 39 y.o. year old female who presented to the ED tonight with Palpitations     Emesis, hypokalemia, pending CT, fluids, reeval.   Tachy improved. Emesis improved. Ct and urine c/w likely pyelo. Cultures added. Rocephin started.   Persistently tachycardic, in associatetion with pyelo and hypokalemia, will d/w medicine for admission.   CRITICAL CARE Performed by: Marily Memos Total critical care time: 35 minutes Critical care time was exclusive of separately billable procedures and treating other patients. Critical care was necessary to treat or prevent imminent or life-threatening deterioration. Critical care was time spent personally by me on the following activities: development of treatment plan with patient and/or surrogate as well as nursing, discussions with consultants, evaluation of patient's response to treatment, examination of patient, obtaining history from patient or surrogate, ordering and performing treatments and interventions, ordering and review of laboratory studies, ordering and review of radiographic studies, pulse oximetry and re-evaluation of patient's condition.  Angiocath insertion Performed by: Marily Memos  Consent: Verbal consent obtained. Risks and benefits: risks, benefits and alternatives were discussed Time out: Immediately prior to procedure a "time out" was called to verify the correct patient, procedure, equipment, support staff and site/side marked as required.  Preparation: Patient was prepped and draped in the usual sterile fashion.  Vein Location: Right forearm  Ultrasound Guided  Gauge: 20  Normal blood return and flush without difficulty Patient tolerance: Patient tolerated the procedure well with no immediate complications.   Labs, studies and imaging reviewed by myself and considered in medical  decision making if ordered. Imaging interpreted by radiology.  Labs Reviewed  CBC - Abnormal; Notable for the following components:      Result Value   WBC 13.0 (*)    RBC 3.65 (*)    Hemoglobin 9.2 (*)    HCT 30.2 (*)    MCH 25.2 (*)    RDW 17.1 (*)    Platelets 435 (*)    All other components within normal limits  COMPREHENSIVE METABOLIC PANEL - Abnormal; Notable for the following components:   Sodium 132 (*)    Potassium 2.7 (*)    Chloride 94 (*)    Glucose, Bld 253 (*)    BUN 22 (*)    Creatinine, Ser 1.34 (*)    Total Protein 9.1 (*)    Albumin 2.8 (*)    AST 11 (*)    GFR, Estimated 53 (*)    All other components within normal limits  URINALYSIS, ROUTINE W REFLEX MICROSCOPIC - Abnormal; Notable for the following components:   APPearance HAZY (*)    Hgb urine dipstick LARGE (*)    Ketones, ur 5 (*)    Protein, ur 30 (*)    Leukocytes,Ua TRACE (*)    Bacteria, UA RARE (*)    All other components within normal limits  CBG MONITORING, ED - Abnormal; Notable for the following components:   Glucose-Capillary 227 (*)    All other components within normal limits  RESPIRATORY PANEL BY RT PCR (FLU A&B, COVID)  URINE CULTURE  TSH  MAGNESIUM  HCG, QUANTITATIVE, PREGNANCY  TROPONIN I (HIGH SENSITIVITY)  TROPONIN I (HIGH SENSITIVITY)    CT ABDOMEN PELVIS W CONTRAST  Final Result    DG Chest 2 View  Final Result      No follow-ups on file.    Treena Cosman, Barbara Cower,  MD 09/22/20 [phone]

## 2020-09-23 DIAGNOSIS — A419 Sepsis, unspecified organism: Secondary | ICD-10-CM

## 2020-09-23 LAB — CBC
HCT: 24 % — ABNORMAL LOW (ref 36.0–46.0)
Hemoglobin: 7.3 g/dL — ABNORMAL LOW (ref 12.0–15.0)
MCH: 25 pg — ABNORMAL LOW (ref 26.0–34.0)
MCHC: 30.4 g/dL (ref 30.0–36.0)
MCV: 82.2 fL (ref 80.0–100.0)
Platelets: 354 10*3/uL (ref 150–400)
RBC: 2.92 MIL/uL — ABNORMAL LOW (ref 3.87–5.11)
RDW: 17 % — ABNORMAL HIGH (ref 11.5–15.5)
WBC: 12.9 10*3/uL — ABNORMAL HIGH (ref 4.0–10.5)
nRBC: 0 % (ref 0.0–0.2)

## 2020-09-23 LAB — BASIC METABOLIC PANEL
Anion gap: 10 (ref 5–15)
BUN: 14 mg/dL (ref 6–20)
CO2: 24 mmol/L (ref 22–32)
Calcium: 8.3 mg/dL — ABNORMAL LOW (ref 8.9–10.3)
Chloride: 97 mmol/L — ABNORMAL LOW (ref 98–111)
Creatinine, Ser: 1.26 mg/dL — ABNORMAL HIGH (ref 0.44–1.00)
GFR, Estimated: 57 mL/min — ABNORMAL LOW (ref >60)
Glucose, Bld: 266 mg/dL — ABNORMAL HIGH (ref 70–99)
Potassium: 3.4 mmol/L — ABNORMAL LOW (ref 3.5–5.1)
Sodium: 131 mmol/L — ABNORMAL LOW (ref 135–145)

## 2020-09-23 LAB — GLUCOSE, CAPILLARY
Glucose-Capillary: 210 mg/dL — ABNORMAL HIGH (ref 70–99)
Glucose-Capillary: 219 mg/dL — ABNORMAL HIGH (ref 70–99)

## 2020-09-23 LAB — URINE CULTURE

## 2020-09-23 MED ORDER — ONDANSETRON 4 MG PO TBDP
4.0000 mg | ORAL_TABLET | Freq: Three times a day (TID) | ORAL | 0 refills | Status: DC | PRN
Start: 2020-09-23 — End: 2020-12-15

## 2020-09-23 MED ORDER — CEFDINIR 300 MG PO CAPS: 300.0000 mg | ORAL_CAPSULE | Freq: Two times a day (BID) | ORAL | 0 refills | Status: AC

## 2020-09-23 MED ORDER — METOPROLOL TARTRATE 25 MG PO TABS: 25.0000 mg | ORAL_TABLET | Freq: Two times a day (BID) | ORAL | 1 refills | Status: DC

## 2020-09-23 MED ORDER — LOSARTAN POTASSIUM 50 MG PO TABS
50.0000 mg | ORAL_TABLET | Freq: Every day | ORAL | Status: DC
Start: 2020-09-26 — End: 2022-09-07

## 2020-09-23 MED ORDER — TRIAMTERENE-HCTZ 75-50 MG PO TABS
1.0000 | ORAL_TABLET | Freq: Every day | ORAL | Status: DC
Start: 2020-09-26 — End: 2022-09-07

## 2020-09-23 MED ORDER — METOCLOPRAMIDE HCL 10 MG PO TABS: 10.0000 mg | ORAL_TABLET | Freq: Three times a day (TID) | ORAL | 0 refills | Status: DC | PRN

## 2020-09-23 NOTE — Discharge Summary (Signed)
Physician Discharge Summary  Theresa Malone WUJ:[id number] DOB: [birth year] DOA: 09/21/2020  [facility]  Admit date: 09/21/2020 Discharge date: 09/23/2020  Time spent: 35 minutes** minutes  Recommendations for Outpatient Follow-up:  1. Repeat basic metabolic panel at the 4 electrolytes renal function 2. Reassess blood pressure and adjust antihypertensive regimen as needed 3. Continue close monitoring of patient's CBGs/A1c with further adjustment to hypoglycemic regimen as required.   Discharge Diagnoses:  Principal Problem:   Acute pyelonephritis Active Problems:   Morbid obesity with BMI of 45.0-49.9, adult (HCC)   Type 2 diabetes mellitus, uncontrolled   Chronic hypertension   Hypokalemia   Hyponatremia   Intractable vomiting with nausea   Pyelonephritis   Sepsis without acute organ dysfunction [facility](HCC)   Discharge Condition: Stable and improved.  Discharged home with instruction to follow-up with PCP in 2 days.  CODE STATUS: Full code.  Diet recommendation: Low calorie, modified carbohydrate and heart healthy diet.  Filed Weights   09/21/20 1812  Weight: 114.3 kg    History of present illness:   Theresa Malone is a 39 y.o. female with medical history significant of obesity, HTN, uncontrolled diabetes, depression, GERD and presumed gastroparesis; who presented to ED with complaints of ongoing nausea/vomiting and abd pain. Patient expressed increased frequency, palpitations and inability to keep things down. She denies fever, chills, hematemsis, melena, hematuria, CP, focal weakness or any other concerns. Of note, [facility] hyperglycemia (09-17-20 >>09/19/20) and expressed that for the last 2 days has not been able to keep her medications down.  ED Course: patient found to be in sepsis with :tachycardia, elevated WBC's, elevated RR and AKI. CT scan abdomen demonstrating pyelonephritis changes and abnormal UA. Patient  actively experiencing N/V and with profound hypokalemia; TRH called for admission.  Hospital Course:  1-sepsis secondary to acute pyelonephritis -Sepsis was present on admission -Significant improvement after IV antibiotics and IV fluid resuscitation -At discharge sepsis features resolved -Patient felt better and decided to go home -Final culture still pending at time of discharge. -Patient was sent home on cefdinir for 5 more days to complete a total of 7 days of antibiotic treatment. -Advised to maintain adequate hydration.  2-nausea/vomiting -In the setting of UTI/sepsis and probably underlying gastroparesis. -Patient was able to tolerate diet prior to discharge and was keeping food and medications well without difficulties. -Continue to further adjust hypoglycemic regimen to assess with better control of her diabetes -As needed Reglan along with the use of as needed Zofran has been prescribed -Instruction for lifestyle modifications, small meals multiple times a day and continue use of PPI for been encouraged.  3-morbid obesity -Body mass index is 47.61 kg/m. -Low calorie diet, portion control increase physical activity discussed with patient. -We will benefit of referral to bariatric clinic.  4-type 2 diabetes with hyperglycemia -A1C > 9 -Resume home hypoglycemic regimen and instructed to follow modified carbohydrates.  5-hypokalemia and hyponatremia -In the setting of GI losses and dehydration -Improved after fluid resuscitation electrolyte repletion -Patient instructed to maintain herself adequately hydrated  6-chronic hypertension -Stable with the use of metoprolol -Norvasc discontinued discharge (calcium channel blockers current worsened gastroparesis) -ARB and HCTZ has been placed on hold in the setting of acute kidney injury while waiting for full recovery.  Patient instructed to resume then on November 1 and will follow for electrolytes, renal function and blood  pressure reassess blood and follow-up visit with PCP.  7-acute kidney injury -In the  setting of prerenal azotemia continue use of nephrotoxic agents along with -UTI improved very close to baseline after discharge patient advised to maintain adequate hydration -Nephrotoxic agents held for at least 3 days after discharge -Will complete oral antibiotic for UTI.  Procedures: See below for x-ray reports.  Consultations:  None  Discharge Exam: Vitals:   09/23/20 0901 09/23/20 1547  BP: (!) 152/85 (!) 138/95  Pulse: (!) 106 100  Resp: 16 18  Temp: 99.1 F (37.3 C) 98.6 F (37 C)  SpO2: 100% 100%    General: Afebrile, no chest pain, reports no further episode of nausea vomiting.  Feeling significantly improved and would like to go home. Cardiovascular: S1 and S2, no rubs, no gallops, no JVD appreciated on exam (given difficult to assess due to body habitus). Respiratory: No using accessory muscle.  Good oxygen saturation on room air.  Good air movement bilaterally.  No wheezing or crackles. Abdomen: Obese, soft, nontender, nondistended, positive bowel sounds. Extremities: No cyanosis or clubbing.  Discharge Instructions   Discharge Instructions    Diet - low sodium heart healthy   Complete by: As directed    Diet Carb Modified   Complete by: As directed    Discharge instructions   Complete by: As directed    Follow modified carbohydrate diet, low sodium and low calorie. -Small meals multiple times a day to facilitate education and minimizing the chances of nausea, vomiting or reflux symptoms. Take medications as prescribed Arrange follow-up with PCP in 10 days Maintain adequate hydration     Allergies as of 09/23/2020      Reactions   Lisinopril Cough   cough      Medication List    STOP taking these medications   amLODipine 5 MG tablet Commonly known as: NORVASC   indomethacin 25 MG capsule Commonly known as: INDOCIN   promethazine 12.5 MG tablet Commonly  known as: PHENERGAN     TAKE these medications   acetaminophen 325 MG tablet Commonly known as: TYLENOL Take 2 tablets (650 mg total) by mouth every 6 (six) hours as needed for mild pain, fever or headache (or Fever >/= 101).   cefdinir 300 MG capsule Commonly known as: OMNICEF Take 1 capsule (300 mg total) by mouth 2 (two) times daily for 5 days.   FLUoxetine 10 MG capsule Commonly known as: PROZAC Take 1 capsule (10 mg total) by mouth daily.   gabapentin 300 MG capsule Commonly known as: NEURONTIN Take 300 mg by mouth 2 (two) times daily.   Lantus SoloStar 100 UNIT/ML Solostar Pen Generic drug: insulin glargine Inject 50 Units into the skin in the morning and at bedtime.   Lidoderm 5 % Generic drug: lidocaine Place 1 patch onto the skin daily.   liraglutide 18 MG/3ML Sopn Commonly known as: VICTOZA Inject 1.2 mg into the skin in the morning.   LORazepam 1 MG tablet Commonly known as: Ativan Take 1 tablet (1 mg total) by mouth 3 (three) times daily as needed for anxiety.   losartan 50 MG tablet Commonly known as: COZAAR Take 1 tablet (50 mg total) by mouth daily. Start taking on: September 26, 2020 What changed: These instructions start on September 26, 2020. If you are unsure what to do until then, ask your doctor or other care provider.   metFORMIN 1000 MG tablet Commonly known as: Glucophage Take 1 tablet (1,000 mg total) by mouth 2 (two) times daily with a meal.   metoCLOPramide 10 MG tablet Commonly known as:  Reglan Take 1 tablet (10 mg total) by mouth every 8 (eight) hours as needed for refractory nausea / vomiting (headache). What changed:   when to take this  reasons to take this   metoprolol tartrate 25 MG tablet Commonly known as: LOPRESSOR Take 1 tablet (25 mg total) by mouth 2 (two) times daily.   ondansetron 4 MG disintegrating tablet Commonly known as: Zofran ODT Take 1 tablet (4 mg total) by mouth every 8 (eight) hours as needed for nausea or  vomiting.   pantoprazole 40 MG tablet Commonly known as: Protonix Take 1 tablet (40 mg total) by mouth daily.   polyethylene glycol 17 g packet Commonly known as: MIRALAX / GLYCOLAX Take 17 g by mouth daily as needed for mild constipation.   traZODone 100 MG tablet Commonly known as: DESYREL Take 1 tablet (100 mg total) by mouth at bedtime as needed for sleep.   triamterene-hydrochlorothiazide 75-50 MG tablet Commonly known as: MAXZIDE Take 1 tablet by mouth daily. Start taking on: September 26, 2020 What changed: These instructions start on September 26, 2020. If you are unsure what to do until then, ask your doctor or other care provider.      Allergies  Allergen Reactions  . Lisinopril Cough    cough    Follow-up Information    [facility] [facility] Schedule an appointment as soon as possible for a visit in 10 day(s).   Contact information: [address] [place] [address] [phone]               The results of significant diagnostics from this hospitalization (including imaging, microbiology, ancillary and laboratory) are listed below for reference.    Significant Diagnostic Studies: DG Chest 2 View  Result Date: 09/21/2020 CLINICAL DATA:  Chest pain cough and body ache EXAM: CHEST - 2 VIEW COMPARISON:  09/17/2020 FINDINGS: No focal opacity or pleural effusion. Cardiac size upper normal. No pneumothorax. IMPRESSION: No active cardiopulmonary disease. Electronically Signed   By: Jasmine Pang M.D.   On: 09/21/2020 18:45   CT ABDOMEN PELVIS W CONTRAST  Result Date: 09/21/2020 CLINICAL DATA:  39 year old female with abdominal pain. EXAM: CT ABDOMEN AND PELVIS WITH CONTRAST TECHNIQUE: Multidetector CT imaging of the abdomen and pelvis was performed using the standard protocol following bolus administration of intravenous contrast. CONTRAST:  OMNIPAQUE IOHEXOL 300 MG/ML  SOLN COMPARISON:  CT abdomen pelvis dated 05/21/2019. FINDINGS: Lower chest:  The visualized lung bases are clear. Probable trace pericardial effusion. No intra-abdominal free air or free fluid. Hepatobiliary: The liver is unremarkable. No intrahepatic biliary ductal dilatation. The gallbladder is unremarkable. Pancreas: Unremarkable. No pancreatic ductal dilatation or surrounding inflammatory changes. Spleen: Normal in size without focal abnormality. Adrenals/Urinary Tract: The adrenal glands unremarkable. There is heterogeneous enhancement with areas of decreased enhancement of the right renal parenchyma. There is right perinephric stranding with enhancement of the right urothelium. Findings most consistent with pyelonephritis. Correlation with urinalysis recommended. No drainable fluid collection abscess. Mild fullness of the right renal pelvis without frank hydronephrosis. There is no hydronephrosis on the left. Subcentimeter left renal hypodense lesion is too small to characterize. There is mild fullness of the right ureter. The left ureter and urinary bladder appear unremarkable. Stomach/Bowel: There is moderate stool throughout the colon. There is no bowel obstruction or active inflammation. The appendix is normal. Vascular/Lymphatic: Mild atherosclerotic calcification of the abdominal aorta. The IVC is unremarkable. No portal venous gas. Several small but rounded retroperitoneal lymph nodes. No adenopathy.  Reproductive: The uterus is anteverted. An intrauterine device is noted which is migrated inferiorly located in the endocervical region. Gynecology consult is advised. Bilateral ovarian cysts. Two adjacent cysts or a septated cyst in the left ovary with combined dimensions of 6.4 x 4.0 cm. Other: None Musculoskeletal: No acute or significant osseous findings. IMPRESSION: 1. Right-sided pyelonephritis. Correlation with urinalysis recommended. No drainable fluid collection. 2. No bowel obstruction. Normal appendix. 3. Bilateral ovarian cysts. Further evaluation with pelvic ultrasound  on a nonemergent/outpatient basis recommended. 4. Malpositioned intrauterine device. Gynecology consult is advised. 5. Aortic Atherosclerosis (ICD10-I70.0). Electronically Signed   By: Elgie Collard M.D.   On: 09/21/2020 22:53    Microbiology: Recent Results (from the past 240 hour(s))  Respiratory Panel by RT PCR (Flu A&B, Covid) - Nasopharyngeal Swab     Status: None   Collection Time: 09/21/20 10:48 PM   Specimen: Nasopharyngeal Swab  Result Value Ref Range Status   SARS Coronavirus 2 by RT PCR NEGATIVE NEGATIVE Final    Comment: (NOTE) SARS-CoV-2 target nucleic acids are NOT DETECTED.  The SARS-CoV-2 RNA is generally detectable in upper respiratoy specimens during the acute phase of infection. The lowest concentration of SARS-CoV-2 viral copies this assay can detect is 131 copies/mL. A negative result does not preclude SARS-Cov-2 infection and should not be used as the sole basis for treatment or other patient management decisions. A negative result may occur with  improper specimen collection/handling, submission of specimen other than nasopharyngeal swab, presence of viral mutation(s) within the areas targeted by this assay, and inadequate number of viral copies (<131 copies/mL). A negative result must be combined with clinical observations, patient history, and epidemiological information. The expected result is Negative.  Fact Sheet for Patients:  https://www.moore.com/  Fact Sheet for Healthcare Providers:  https://www.young.biz/  This test is no t yet approved or cleared by the [place] FDA and  has been authorized for detection and/or diagnosis of SARS-CoV-2 by FDA under an Emergency Use Authorization (EUA). This EUA will remain  in effect (meaning this test can be used) for the duration of the COVID-19 declaration under Section 564(b)(1) of the Act, 21 U.S.C. section 360bbb-3(b)(1), unless the authorization is terminated  or revoked sooner.     Influenza A by PCR NEGATIVE NEGATIVE Final   Influenza B by PCR NEGATIVE NEGATIVE Final    Comment: (NOTE) The Xpert Xpress SARS-CoV-2/FLU/RSV assay is intended as an aid in  the diagnosis of influenza from Nasopharyngeal swab specimens and  should not be used as a sole basis for treatment. Nasal washings and  aspirates are unacceptable for Xpert Xpress SARS-CoV-2/FLU/RSV  testing.  Fact Sheet for Patients: https://www.moore.com/  Fact Sheet for Healthcare Providers: https://www.young.biz/  This test is not yet approved or cleared by the [place] FDA and  has been authorized for detection and/or diagnosis of SARS-CoV-2 by  FDA under an Emergency Use Authorization (EUA). This EUA will remain  in effect (meaning this test can be used) for the duration of the  Covid-19 declaration under Section 564(b)(1) of the Act, 21  U.S.C. section 360bbb-3(b)(1), unless the authorization is  terminated or revoked. Performed at [facility], [address], [place], [place] [address]   Urine culture     Status: Abnormal   Collection Time: 09/22/20 12:14 AM   Specimen: Urine, Clean Catch  Result Value Ref Range Status   Specimen Description   Final    URINE, CLEAN CATCH Performed at [facility], [address], [place],  [place] [address]    Special Requests   Final    NONE Performed at [facility], [address], [place], [place] [address]    Culture MULTIPLE SPECIES PRESENT, SUGGEST RECOLLECTION (A)  Final   Report Status 09/23/2020 FINAL  Final     Labs: Basic Metabolic Panel: Recent Labs  Lab 09/21/20 1854 09/21/20 1902 09/22/20 0931 09/23/20 0504  NA  --  132*  --  131*  K  --  2.7*  --  3.4*  CL  --  94*  --  97*  CO2  --  23  --  24  GLUCOSE  --  253*  --  266*  BUN  --  22*  --  14  CREATININE  --  1.34*  --  1.26*  CALCIUM  --  9.3  --  8.3*  MG 1.9  --  2.4  --   PHOS  --   --  3.5  --    Liver  Function Tests: Recent Labs  Lab 09/21/20 1902  AST 11*  ALT 14  ALKPHOS 121  BILITOT 1.1  PROT 9.1*  ALBUMIN 2.8*   CBC: Recent Labs  Lab 09/21/20 1854 09/23/20 0504  WBC 13.0* 12.9*  HGB 9.2* 7.3*  HCT 30.2* 24.0*  MCV 82.7 82.2  PLT 435* 354   CBG: Recent Labs  Lab 09/22/20 1151 09/22/20 1702 09/22/20 2017 09/23/20 0752 09/23/20 1246  GLUCAP 169* 197* 232* 210* 219*    Signed:  Vassie Loll MD.  [facility] 09/23/2020, 4:26 PM

## 2020-12-01 ENCOUNTER — Ambulatory Visit: Payer: Medicaid Other | Admitting: Advanced Practice Midwife

## 2020-12-15 ENCOUNTER — Encounter: Payer: Self-pay | Admitting: Emergency Medicine

## 2020-12-15 ENCOUNTER — Ambulatory Visit (INDEPENDENT_AMBULATORY_CARE_PROVIDER_SITE_OTHER): Payer: Medicaid Other | Admitting: Adult Health

## 2020-12-15 ENCOUNTER — Other Ambulatory Visit: Payer: Self-pay

## 2020-12-15 ENCOUNTER — Ambulatory Visit (INDEPENDENT_AMBULATORY_CARE_PROVIDER_SITE_OTHER): Payer: Medicaid Other

## 2020-12-15 ENCOUNTER — Encounter: Payer: Self-pay | Admitting: Adult Health

## 2020-12-15 ENCOUNTER — Ambulatory Visit
Admission: EM | Admit: 2020-12-15 | Discharge: 2020-12-15 | Disposition: A | Discharge status: Home / Self Care | Payer: Medicaid Other | Attending: Emergency Medicine | Admitting: Emergency Medicine

## 2020-12-15 VITALS — BP 145/90 | HR 109 | Ht 61.0 in | Wt 230.0 lb

## 2020-12-15 DIAGNOSIS — L732 Hidradenitis suppurativa: Secondary | ICD-10-CM | POA: Diagnosis not present

## 2020-12-15 DIAGNOSIS — M25532 Pain in left wrist: Secondary | ICD-10-CM | POA: Diagnosis not present

## 2020-12-15 DIAGNOSIS — N946 Dysmenorrhea, unspecified: Secondary | ICD-10-CM | POA: Diagnosis not present

## 2020-12-15 DIAGNOSIS — N920 Excessive and frequent menstruation with regular cycle: Secondary | ICD-10-CM | POA: Diagnosis not present

## 2020-12-15 DIAGNOSIS — S6992XA Unspecified injury of left wrist, hand and finger(s), initial encounter: Secondary | ICD-10-CM | POA: Diagnosis not present

## 2020-12-15 DIAGNOSIS — Z30432 Encounter for removal of intrauterine contraceptive device: Secondary | ICD-10-CM | POA: Insufficient documentation

## 2020-12-15 MED ORDER — IBUPROFEN 800 MG PO TABS: 800.0000 mg | ORAL_TABLET | Freq: Three times a day (TID) | ORAL | 0 refills | Status: DC | PRN

## 2020-12-15 MED ORDER — NORETHINDRONE ACETATE 5 MG PO TABS: 5.0000 mg | ORAL_TABLET | Freq: Every day | ORAL | 3 refills | Status: DC

## 2020-12-15 MED ORDER — SILVER SULFADIAZINE 1 % EX CREA: 1.0000 "application " | TOPICAL_CREAM | Freq: Two times a day (BID) | CUTANEOUS | 2 refills | Status: DC

## 2020-12-15 MED ORDER — SULFAMETHOXAZOLE-TRIMETHOPRIM 800-160 MG PO TABS: 1.0000 | ORAL_TABLET | Freq: Two times a day (BID) | ORAL | 0 refills | Status: DC

## 2020-12-15 NOTE — Progress Notes (Signed)
Subjective:     Patient ID: Theresa Malone, female   DOB: [birth year], 40 y.o.   MRN: [id number]  HPI Theresa Malone is a 40 year old black female, married, [id number], sp tubal in requesting IUD be removed, it is not helping with heavy periods.  PCP is [facility].   Review of Systems Had IUD placed in 40 2020 to control periods and still has heavy periods, clots and cramps  Changes tampon and over night pad every 3 hours or so.   Reviewed past medical,surgical, social and family history. Reviewed medications and allergies.     Objective:   Physical Exam BP (!) 145/90 (BP Location: Left Arm, Patient Position: Sitting, Cuff Size: Large)   Pulse (!) 109   Ht 5\' 1"  (1.549 m)   Wt 230 lb (104.3 kg)   LMP 11/29/2020   BMI 43.46 kg/m  Consent signed, time out called.   Skin warm and dry.Pelvic: external genitalia is has hidradenitis bilateral inner thigh, one draining purulent white fluid, vagina: pink and moist,urethra has no lesions or masses noted, cervix:smooth and bulbous,IUD strings and grasp with forceps, pt ask to cough and IUD easily removed, uterus: feels slightly enlarged, mildly tender,  adnexa: no masses or tenderness noted. Bladder is non tender and no masses felt.  Has hidradenitis under left arm and right breast too. Fall risk is low  Upstream - 12/15/20 1615      Pregnancy Intention Screening   Does the patient want to become pregnant in the next year? No    Does the patient's partner want to become pregnant in the next year? No    Would the patient like to discuss contraceptive options today? No      Contraception Wrap Up   Current Method Female Sterilization    End Method Female Sterilization    Contraception Counseling Provided No         Examination chaperoned by 12/17/20 LPN On further review of her chart she had CT 09/21/20 IUD was noted to be malpositioned   Assessment:     1. Encounter for IUD removal  2. Menorrhagia with regular cycle Will get GYN 09/23/20 to assess  uterus and will try aygestin 5 mg daily Discussed endometrial ablation and gave her a handout    3. Dysmenorrhea   4. Hidradenitis suppurativa Will rx septra ds and silvadene cream   Meds ordered this encounter  Medications  . sulfamethoxazole-trimethoprim (BACTRIM DS) 800-160 MG tablet    Sig: Take 1 tablet by mouth 2 (two) times daily. Take 1 bid    Dispense:  28 tablet    Refill:  0    Order Specific Question:   Supervising Provider    Answer:   EURE, LUTHER H [[id number]]  . silver sulfADIAZINE (SILVADENE) 1 % cream    Sig: Apply 1 application topically 2 (two) times daily.    Dispense:  50 g    Refill:  2    Order Specific Question:   Supervising Provider    Answer:   EURE, LUTHER H [[id number]]  . norethindrone (AYGESTIN) 5 MG tablet    Sig: Take 1 tablet (5 mg total) by mouth daily.    Dispense:  30 tablet    Refill:  3    Order Specific Question:   Supervising Provider    Answer:   [place] [[id number]]      Plan:     Return for GYN Lazaro Arms in about 2 weeks then see me 2-3 days  later

## 2020-12-15 NOTE — Discharge Instructions (Addendum)
Ibuprofen 800 mg was prescribed/take as directed with food Follow RICE instruction that is attached Follow-up with PCP Return or go to ED if you develop any new or worsening of his symptoms

## 2020-12-15 NOTE — ED Triage Notes (Signed)
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

## 2020-12-15 NOTE — ED Provider Notes (Signed)
[facility]   [id number] 12/15/20 Arrival Time: 1714   Chief Complaint  Patient presents with  . Wrist Pain     SUBJECTIVE: History from: patient and family.  Theresa Malone is a 40 y.o. female who presented to the urgent care for complaint of left wrist pain for the past 6 days.  Developed the symptom after injuring the left wrist.  She localizes the pain to the left wrist.  She describes the pain as constant and achy.  She has tried OTC medications without relief.  Her symptoms are made worse with ROM.  She denies similar symptoms in the past.  Denies chills, fever, nausea, vomiting, diarrhea   ROS: As per HPI.  All other pertinent ROS negative.     Past Medical History:  Diagnosis Date  . Anxiety    panic attacks  . Cellulitis   . Depression   . Diabetes mellitus   . Gout   . Hypertension   . Obesity   . Venous stasis   . Venous stasis dermatitis    Past Surgical History:  Procedure Laterality Date  . CESAREAN SECTION     C/S x 2  . CESAREAN SECTION N/A 01/24/2015   Procedure: CESAREAN SECTION;  Surgeon: Catalina Antigua, MD;  Location: [facility];  Service: Obstetrics;  Laterality: N/A;  . TUBAL LIGATION     Allergies  Allergen Reactions  . Lisinopril Cough    cough   No current facility-administered medications on file prior to encounter.   Current Outpatient Medications on File Prior to Encounter  Medication Sig Dispense Refill  . acetaminophen (TYLENOL) 325 MG tablet Take 2 tablets (650 mg total) by mouth every 6 (six) hours as needed for mild pain, fever or headache (or Fever >/= 101). 12 tablet 0  . gabapentin (NEURONTIN) 300 MG capsule Take 300 mg by mouth 2 (two) times daily.    . indomethacin (INDOCIN) 25 MG capsule Take 50 mg by mouth 3 (three) times daily with meals as needed.    . insulin glargine (LANTUS SOLOSTAR) 100 UNIT/ML Solostar Pen Inject 56 Units into the skin in the morning and at bedtime.    . liraglutide (VICTOZA) 18 MG/3ML SOPN  Inject 1.2 mg into the skin in the morning.     Marland Kitchen losartan (COZAAR) 50 MG tablet Take 1 tablet (50 mg total) by mouth daily.    . metFORMIN (GLUCOPHAGE) 1000 MG tablet Take 1 tablet (1,000 mg total) by mouth 2 (two) times daily with a meal. 60 tablet 11  . metFORMIN (GLUCOPHAGE) 1000 MG tablet Take 1,000 mg by mouth 2 (two) times daily with a meal.    . Naproxen Sodium (ALEVE PO) Take by mouth as needed.    . norethindrone (AYGESTIN) 5 MG tablet Take 1 tablet (5 mg total) by mouth daily. 30 tablet 3  . pantoprazole (PROTONIX) 40 MG tablet Take 1 tablet (40 mg total) by mouth daily. 30 tablet 1  . promethazine (PHENERGAN) 12.5 MG tablet Take 12.5 mg by mouth every 6 (six) hours as needed for nausea or vomiting.    . silver sulfADIAZINE (SILVADENE) 1 % cream Apply 1 application topically 2 (two) times daily. 50 g 2  . sulfamethoxazole-trimethoprim (BACTRIM DS) 800-160 MG tablet Take 1 tablet by mouth 2 (two) times daily. Take 1 bid 28 tablet 0  . traMADol (ULTRAM) 50 MG tablet Take 50 mg by mouth every 6 (six) hours as needed.    . traZODone (DESYREL) 100 MG tablet  Take 1 tablet (100 mg total) by mouth at bedtime as needed for sleep. (Patient taking differently: Take 50-100 mg by mouth at bedtime as needed for sleep.) 30 tablet 1  . triamterene-hydrochlorothiazide (MAXZIDE) 75-50 MG tablet Take 1 tablet by mouth daily.    . [DISCONTINUED] clonazepam (KLONOPIN) 0.125 MG disintegrating tablet Take 1 tablet (0.125 mg total) by mouth 2 (two) times daily. (Patient not taking: Reported on 05/19/2019) 15 tablet 0   Social History   Socioeconomic History  . Marital status: Married    Spouse name: Not on file  . Number of children: Not on file  . Years of education: Not on file  . Highest education level: Not on file  Occupational History  . Not on file  Tobacco Use  . Smoking status: Never Smoker  . Smokeless tobacco: Never Used  Vaping Use  . Vaping Use: Never used  Substance and Sexual Activity   . Alcohol use: Not Currently    Comment: wine occ  . Drug use: No  . Sexual activity: Yes    Birth control/protection: Surgical    Comment: tubal  Other Topics Concern  . Not on file  Social History Narrative  . Not on file   Social Determinants of Health   Financial Resource Strain: Not on file  Food Insecurity: Not on file  Transportation Needs: Not on file  Physical Activity: Not on file  Stress: Not on file  Social Connections: Not on file  Intimate Partner Violence: Not on file   Family History  Problem Relation Age of Onset  . Diabetes Paternal Grandfather   . Cancer Paternal Grandfather        liver  . Cancer Paternal Grandmother        liver  . Hypertension Father   . Diabetes Mother   . Hypertension Mother   . Diabetes Maternal Uncle   . Diabetes Maternal Grandmother   . Cancer Maternal Grandfather   . Diabetes Daughter        boarderline   . Asthma Daughter   . Bronchitis Daughter   . Bronchitis Son   . Bronchitis Daughter     OBJECTIVE:  Vitals:   12/15/20 1717  BP: 130/87  Pulse: 95  Resp: 15  Temp: 98.2 F (36.8 C)  SpO2: 98%     Physical Exam Vitals and nursing note reviewed.  Constitutional:      General: She is not in acute distress.    Appearance: Normal appearance. She is normal weight. She is not ill-appearing, toxic-appearing or diaphoretic.  HENT:     Head: Normocephalic.  Cardiovascular:     Rate and Rhythm: Normal rate and regular rhythm.     Pulses: Normal pulses.     Heart sounds: Normal heart sounds. No murmur heard. No friction rub. No gallop.   Pulmonary:     Effort: Pulmonary effort is normal. No respiratory distress.     Breath sounds: Normal breath sounds. No stridor. No wheezing, rhonchi or rales.  Chest:     Chest wall: No tenderness.  Musculoskeletal:        General: Tenderness present.     Right wrist: Normal.     Left wrist: Swelling and tenderness present.     Comments: The left wrist is with obvious  deformity when compared to the right wrist.  Swelling and tenderness present.  There is no ecchymosis, open wound, lesion, warmth, surface trauma present.  Limited range of motion.  Neurovascular status intact.  Neurological:     Mental Status: She is alert and oriented to person, place, and time.     LABS:  No results found for this or any previous visit (from the past 24 hour(s)).   RADIOLOGY:  DG Wrist Complete Left  Result Date: 12/15/2020 CLINICAL DATA:  Wrist pain EXAM: LEFT WRIST - COMPLETE 3+ VIEW COMPARISON:  None. FINDINGS: There is no evidence of fracture or dislocation. There is no evidence of arthropathy or other focal bone abnormality. Soft tissues are unremarkable. IMPRESSION: Negative. Electronically Signed   By: Jasmine Pang M.D.   On: 12/15/2020 17:49   Left wrist x-ray is negative for bony abnormality including fracture or dislocation.  I have reviewed the x-ray myself and the radiologist interpretation.  I am in agreement with the radiologist interpretation.   ASSESSMENT & PLAN:  1. Left wrist pain   2. Injury of left wrist, initial encounter     Meds ordered this encounter  Medications  . ibuprofen (ADVIL) 800 MG tablet    Sig: Take 1 tablet (800 mg total) by mouth every 8 (eight) hours as needed.    Dispense:  30 tablet    Refill:  0    Discharge instructions  Ibuprofen 800 mg was prescribed/take as directed with food Follow RICE instruction that is attached Follow-up with PCP Return or go to ED if you develop any new or worsening of his symptoms  Reviewed expectations re: course of current medical issues. Questions answered. Outlined signs and symptoms indicating need for more acute intervention. Patient verbalized understanding. After Visit Summary given.         Durward Parcel, FNP 12/15/20 [phone]

## 2020-12-29 ENCOUNTER — Ambulatory Visit (INDEPENDENT_AMBULATORY_CARE_PROVIDER_SITE_OTHER): Payer: Medicaid Other

## 2020-12-29 ENCOUNTER — Other Ambulatory Visit: Payer: Self-pay

## 2020-12-29 DIAGNOSIS — N946 Dysmenorrhea, unspecified: Secondary | ICD-10-CM

## 2020-12-29 DIAGNOSIS — N920 Excessive and frequent menstruation with regular cycle: Secondary | ICD-10-CM

## 2020-12-29 NOTE — Progress Notes (Addendum)
PELVIC US TA/TV: heterogeneous anteverted uterus,wnl,EEC 6 mm,mult small simple nabothian cysts,normal ovaries,ovaries appear mobile,no free fluid,left adnexal pain during ultrasound

## 2021-01-02 ENCOUNTER — Ambulatory Visit: Payer: Medicaid Other | Admitting: Adult Health

## 2021-01-18 ENCOUNTER — Encounter: Payer: Self-pay | Admitting: Adult Health

## 2021-01-18 ENCOUNTER — Other Ambulatory Visit: Payer: Self-pay

## 2021-01-18 ENCOUNTER — Telehealth (INDEPENDENT_AMBULATORY_CARE_PROVIDER_SITE_OTHER): Payer: Medicaid Other | Admitting: Adult Health

## 2021-01-18 VITALS — BP 134/80 | Ht 61.0 in | Wt 222.0 lb

## 2021-01-18 DIAGNOSIS — N920 Excessive and frequent menstruation with regular cycle: Secondary | ICD-10-CM

## 2021-01-18 NOTE — Progress Notes (Signed)
Patient ID: Theresa Malone, female   DOB: 1981-05-28, 40 y.o.   MRN: [id number]   TELEHEALTH GYNECOLOGY VISIT ENCOUNTER NOTE  Provider location: Center for [facility]   I connected with Theresa Malone on 01/18/21 at 11:10 AM EST by telephone at home and verified that I am speaking with the correct person using two identifiers. Patient was unable to do MyChart audiovisual encounter due to technical difficulties, she tried several times.    I discussed the limitations, risks, security and privacy concerns of performing an evaluation and management service by telephone and the availability of in person appointments. I also discussed with the patient that there may be a patient responsible charge related to this service. The patient expressed understanding and agreed to proceed.   History:  Theresa Malone is a 40 y.o. [id number] female being evaluated today for bleeding, and it has slowed with Aygestin and no cramps,but  is still spotting some esp after sex, has increased libido. Will review [place] results, [place] was normal. She denies any pelvic pain or other concerns.       Past Medical History:  Diagnosis Date  . Anxiety    panic attacks  . Cellulitis   . Depression   . Diabetes mellitus   . Gout   . Hypertension   . Obesity   . Venous stasis   . Venous stasis dermatitis    Past Surgical History:  Procedure Laterality Date  . CESAREAN SECTION     C/S x 2  . CESAREAN SECTION N/A 01/24/2015   Procedure: CESAREAN SECTION;  Surgeon: Catalina Antigua, MD;  Location: [facility];  Service: Obstetrics;  Laterality: N/A;  . TUBAL LIGATION     The following portions of the patient's history were reviewed and updated as appropriate: allergies, current medications, past family history, past medical history, past social history, past surgical history and problem list.   Health Maintenance: Needs pap, will get scheduled   Review of Systems:  Pertinent items noted in HPI and remainder  of comprehensive ROS otherwise negative.  Physical Exam:   General:  Alert, oriented and cooperative.   Mental Status: Normal mood and affect perceived. Normal judgment and thought content.  Physical exam deferred due to nature of the encounter BP 134/80 (BP Location: Left Arm)   Ht 5\' 1"  (1.549 m)   Wt 222 lb (100.7 kg)   LMP 01/14/2021   BMI 41.95 kg/m   Fall risk is low  Upstream - 01/18/21 1151      Pregnancy Intention Screening   Does the patient want to become pregnant in the next year? No    Does the patient's partner want to become pregnant in the next year? No    Would the patient like to discuss contraceptive options today? No      Contraception Wrap Up   Current Method Female Sterilization    End Method Female Sterilization    Contraception Counseling Provided No          Labs and Imaging No results found for this or any previous visit (from the past 336 hour(s)). 01/20/21 PELVIS TRANSVAGINAL NON-OB (TV ONLY)  Result Date: 12/30/2020  Center for [facility] [address] [place]  GYNECOLOGIC SONOGRAM Theresa Malone is a 40 y.o. [id number] Patient's last menstrual period was 11/29/2020. She is here for a pelvic sonogram for dysmenorrhea/menorrhagia. Uterus                      9.6 x 4.4 x 5.9 cm, Total uterine volume 128 cc, heterogeneous anteverted uterus,wnl Endometrium          6 mm, symmetrical, wnl Right ovary             2.5 x 2.2 x 2.6 cm, wnl,dominate simple follicle 2.3 x 1.7 x 2 cm Left ovary                1.9 x 1.9 x 1.4 cm, wnl (limited view) No free fluid Technician Comments: PELVIC US TA/TV: heterogeneous anteverted uterus,wnl,EEC 6 mm,mult small simple nabothian cysts,normal ovaries,ovaries appear mobile,no free fluid,left adnexal pain during ultrasound [employer] 12/29/2020 1:28 PM Clinical  Impression and recommendations: I have reviewed the sonogram results above, combined with the patient's current clinical course, below are my impressions and any appropriate recommendations for management based on the sonographic findings. Uterus is normal size shape and contour Endometrium is normal Both ovaries are normal Lazaro Arms 12/30/2020 12:39 PM   US PELVIS (TRANSABDOMINAL ONLY)  Result Date: 12/30/2020  Center for [facility] [address] [place] [address]                                                                                                                                   GYNECOLOGIC SONOGRAM Theresa Malone is a 40 y.o. [id number] Patient's last menstrual period was 11/29/2020. She is here for a pelvic sonogram for dysmenorrhea/menorrhagia. Uterus                      9.6 x 4.4 x 5.9 cm, Total uterine volume 128 cc, heterogeneous anteverted uterus,wnl Endometrium          6 mm, symmetrical, wnl Right ovary             2.5 x 2.2 x 2.6 cm, wnl,dominate simple follicle 2.3 x 1.7 x 2 cm Left ovary                1.9 x 1.9 x 1.4 cm, wnl (limited view) No free fluid Technician Comments: PELVIC US TA/TV: heterogeneous anteverted uterus,wnl,EEC 6 mm,mult small simple nabothian cysts,normal ovaries,ovaries appear mobile,no free fluid,left adnexal pain during ultrasound [employer] 12/29/2020 1:28 PM Clinical Impression and recommendations: I have reviewed the sonogram results above, combined with the patient's current clinical course, below are my impressions and any appropriate recommendations for management based on the sonographic findings. Uterus is normal size shape and contour Endometrium is normal Both ovaries are normal Lazaro Arms 12/30/2020 12:39 PM      Assessment and Plan:     1. Menorrhagia  with regular cycle Continue aygestin, has refills Follow up with me in 8 weeks, will need pap and physical        I discussed the assessment and treatment plan  with the patient. The patient was provided an opportunity to ask questions and all were answered. The patient agreed with the plan and demonstrated an understanding of the instructions.   The patient was advised to call back or seek an in-person evaluation/go to the ED if the symptoms worsen or if the condition fails to improve as anticipated.  I provided 8  minutes of non-face-to-face time during this encounter. I was in my office at [facility] during this encounter   Cyril Mourning, NP Center for [employer], [facility]

## 2021-03-05 ENCOUNTER — Ambulatory Visit
Admission: EM | Admit: 2021-03-05 | Discharge: 2021-03-05 | Disposition: A | Discharge status: Home / Self Care | Payer: Medicaid Other | Attending: Physician Assistant | Admitting: Physician Assistant

## 2021-03-05 ENCOUNTER — Encounter: Payer: Self-pay | Admitting: Emergency Medicine

## 2021-03-05 DIAGNOSIS — K089 Disorder of teeth and supporting structures, unspecified: Secondary | ICD-10-CM

## 2021-03-05 DIAGNOSIS — G8929 Other chronic pain: Secondary | ICD-10-CM | POA: Diagnosis not present

## 2021-03-05 MED ORDER — AMOXICILLIN-POT CLAVULANATE 875-125 MG PO TABS: 1.0000 | ORAL_TABLET | Freq: Two times a day (BID) | ORAL | 0 refills | Status: AC

## 2021-03-05 MED ORDER — IBUPROFEN 800 MG PO TABS: 800.0000 mg | ORAL_TABLET | Freq: Three times a day (TID) | ORAL | 0 refills | Status: DC | PRN

## 2021-03-05 MED ORDER — KETOROLAC TROMETHAMINE 30 MG/ML IJ SOLN
30.0000 mg | Freq: Once | INTRAMUSCULAR | Status: AC
  Administered 2021-03-05: 30 mg via INTRAMUSCULAR

## 2021-03-05 NOTE — ED Triage Notes (Signed)
Dental pain to LT lower side that started yesterday

## 2021-03-05 NOTE — Discharge Instructions (Addendum)
Take medication as prescribed. Follow up with dentist

## 2021-03-06 NOTE — ED Provider Notes (Signed)
[facility]    CSN: [id number] Arrival date & time: 03/05/21  1435      History   Chief Complaint Chief Complaint  Patient presents with  . Dental Pain    HPI Theresa Malone is a 40 y.o. female.   Pt complains of left lower dental pain that started yesterday.  H/o dental problems with the same tooth.  Seen before in ED, received dental block with immediate improvement.  She has not followed up with a dentist.  Denies fever, chills, n/v/d. Reports some radiation to her left ear. She has taken ibuprofen with minimal relief.      Past Medical History:  Diagnosis Date  . Anxiety    panic attacks  . Cellulitis   . Depression   . Diabetes mellitus   . Gout   . Hypertension   . Obesity   . Venous stasis   . Venous stasis dermatitis     Patient Active Problem List   Diagnosis Date Noted  . Dysmenorrhea 12/15/2020  . Encounter for IUD removal 12/15/2020  . Sepsis without acute organ dysfunction (HCC)   . Acute pyelonephritis 09/22/2020  . Pyelonephritis 09/22/2020  . Emesis, persistent 05/21/2019  . Cellulitis   . Intractable vomiting with nausea 04/13/2019  . Acute respiratory alkalosis   . Intractable vomiting   . Chest pain   . Cellulitis of right lower extremity 04/12/2019  . Intractable abdominal pain 04/11/2019  . Metabolic alkalosis 04/11/2019  . Respiratory alkalosis 04/11/2019  . Volume depletion 04/11/2019  . Hypokalemia 04/11/2019  . Hyponatremia 04/11/2019  . Yeast vaginitis 03/13/2018  . Type 2 diabetes mellitus, uncontrolled 02/07/2016  . Chronic hypertension 02/07/2016  . Menorrhagia with regular cycle 02/07/2016  . Frequent loose stools 02/07/2016  . Status post cesarean section 01/25/2015  . Hidradenitis axillaris 09/15/2014  . Susceptible to varicella (non-immune), currently pregnant 07/24/2014  . Hidradenitis suppurativa 07/20/2014  . Morbid obesity with BMI of 45.0-49.9, adult (HCC) 03/03/2014    Past Surgical History:   Procedure Laterality Date  . CESAREAN SECTION     C/S x 2  . CESAREAN SECTION N/A 01/24/2015   Procedure: CESAREAN SECTION;  Surgeon: Catalina Antigua, MD;  Location: [facility];  Service: Obstetrics;  Laterality: N/A;  . TUBAL LIGATION      OB History    Gravida  4   Para  4   Term  2   Preterm  2   AB      Living  4     SAB      IAB      Ectopic      Multiple  0   Live Births  4            Home Medications    Prior to Admission medications   Medication Sig Start Date End Date Taking? Authorizing Provider  amoxicillin-clavulanate (AUGMENTIN) 875-125 MG tablet Take 1 tablet by mouth 2 (two) times daily for 10 days. 03/05/21 03/15/21 Yes Joey Hudock, Shanda Bumps, PA-C  acetaminophen (TYLENOL) 325 MG tablet Take 2 tablets (650 mg total) by mouth every 6 (six) hours as needed for mild pain, fever or headache (or Fever >/= 101). 05/22/19   Emokpae, Courage, MD  gabapentin (NEURONTIN) 300 MG capsule Take 300 mg by mouth 2 (two) times daily.    [provider]  ibuprofen (ADVIL) 800 MG tablet Take 1 tablet (800 mg total) by mouth every 8 (eight) hours as needed. 03/05/21   Jodell Cipro, PA-C  indomethacin (INDOCIN) 25 MG capsule Take 50 mg by mouth 3 (three) times daily with meals as needed.    [provider]  insulin glargine (LANTUS SOLOSTAR) 100 UNIT/ML Solostar Pen Inject 56 Units into the skin in the morning and at bedtime.    [provider]  liraglutide (VICTOZA) 18 MG/3ML SOPN Inject 1.2 mg into the skin in the morning.     [provider]  losartan (COZAAR) 50 MG tablet Take 1 tablet (50 mg total) by mouth daily. 09/26/20   Vassie Loll, MD  metFORMIN (GLUCOPHAGE) 1000 MG tablet Take 1,000 mg by mouth 2 (two) times daily with a meal.    [provider]  Naproxen Sodium (ALEVE PO) Take by mouth as needed.    [provider]  promethazine (PHENERGAN) 12.5 MG tablet Take 12.5 mg by mouth every 6 (six) hours as needed for  nausea or vomiting.    [provider]  traMADol (ULTRAM) 50 MG tablet Take 50 mg by mouth every 6 (six) hours as needed.    [provider]  traZODone (DESYREL) 100 MG tablet Take 1 tablet (100 mg total) by mouth at bedtime as needed for sleep. Patient taking differently: Take 50-100 mg by mouth at bedtime as needed for sleep. 05/22/19   Shon Hale, MD  triamterene-hydrochlorothiazide (MAXZIDE) 75-50 MG tablet Take 1 tablet by mouth daily. 09/26/20   Vassie Loll, MD  clonazepam (KLONOPIN) 0.125 MG disintegrating tablet Take 1 tablet (0.125 mg total) by mouth 2 (two) times daily. Patient not taking: Reported on 05/19/2019 04/15/19 05/21/19  Catarina Hartshorn, MD    Family History Family History  Problem Relation Age of Onset  . Diabetes Paternal Grandfather   . Cancer Paternal Grandfather        liver  . Cancer Paternal Grandmother        liver  . Hypertension Father   . Diabetes Mother   . Hypertension Mother   . Diabetes Maternal Uncle   . Diabetes Maternal Grandmother   . Cancer Maternal Grandfather   . Diabetes Daughter        boarderline   . Asthma Daughter   . Bronchitis Daughter   . Bronchitis Son   . Bronchitis Daughter     Social History Social History   Tobacco Use  . Smoking status: Never Smoker  . Smokeless tobacco: Never Used  Vaping Use  . Vaping Use: Never used  Substance Use Topics  . Alcohol use: Not Currently    Comment: wine occ  . Drug use: No     Allergies   Sulfa antibiotics and Lisinopril   Review of Systems Review of Systems  Constitutional: Negative for chills and fever.  HENT: Positive for dental problem. Negative for ear pain and sore throat.   Eyes: Negative for pain and visual disturbance.  Respiratory: Negative for cough and shortness of breath.   Cardiovascular: Negative for chest pain and palpitations.  Gastrointestinal: Negative for abdominal pain and vomiting.  Genitourinary: Negative for dysuria and hematuria.   Musculoskeletal: Negative for arthralgias and back pain.  Skin: Negative for color change and rash.  Neurological: Negative for seizures and syncope.  All other systems reviewed and are negative.    Physical Exam Triage Vital Signs ED Triage Vitals  Enc Vitals Group     BP 03/05/21 1444 137/87     Pulse Rate 03/05/21 1444 (!) 113     Resp 03/05/21 1444 19     Temp 03/05/21 1444 99  F (37.2 C)     Temp Source 03/05/21 1444 Oral     SpO2 03/05/21 1444 98 %     Weight --      Height --      Head Circumference --      Peak Flow --      Pain Score 03/05/21 1443 10     Pain Loc --      Pain Edu? --      Excl. in GC? --    No data found.  Updated Vital Signs BP 137/87 (BP Location: Right Arm)   Pulse (!) 113   Temp 99 F (37.2 C) (Oral)   Resp 19   LMP 02/25/2021   SpO2 98%   Visual Acuity Right Eye Distance:   Left Eye Distance:   Bilateral Distance:    Right Eye Near:   Left Eye Near:    Bilateral Near:     Physical Exam Vitals and nursing note reviewed.  Constitutional:      General: She is not in acute distress.    Appearance: She is well-developed.  HENT:     Head: Normocephalic and atraumatic.     Mouth/Throat:     Dentition: Abnormal dentition. Dental caries present.   Eyes:     Conjunctiva/sclera: Conjunctivae normal.  Cardiovascular:     Rate and Rhythm: Normal rate and regular rhythm.     Heart sounds: No murmur heard.   Pulmonary:     Effort: Pulmonary effort is normal. No respiratory distress.     Breath sounds: Normal breath sounds.  Abdominal:     Palpations: Abdomen is soft.     Tenderness: There is no abdominal tenderness.  Musculoskeletal:     Cervical back: Neck supple.  Skin:    General: Skin is warm and dry.  Neurological:     Mental Status: She is alert.      UC Treatments / Results  Labs (all labs ordered are listed, but only abnormal results are displayed) Labs Reviewed - No data to display  EKG   Radiology No  results found.  Procedures Procedures (including critical care time)  Medications Ordered in UC Medications  ketorolac (TORADOL) 30 MG/ML injection 30 mg (30 mg Intramuscular Given 03/05/21 1513)    Initial Impression / Assessment and Plan / UC Course  I have reviewed the triage vital signs and the nursing notes.  Pertinent labs & imaging results that were available during my care of the patient were reviewed by me and considered in my medical decision making (see chart for details).     Toradol given in clinic today.  Augmentin prescribed.  Pt advised to follow up with dentist, information about local dentist given today.  Unable to perform dental block in clinic, she reports if no improvement with medications prescribed today she will go to the ED. Final Clinical Impressions(s) / UC Diagnoses   Final diagnoses:  Chronic dental pain     Discharge Instructions     Take medication as prescribed Follow up with dentist   ED Prescriptions    Medication Sig Dispense Auth. Provider   amoxicillin-clavulanate (AUGMENTIN) 875-125 MG tablet Take 1 tablet by mouth 2 (two) times daily for 10 days. 20 tablet Kayvan Hoefling, PA-C   ibuprofen (ADVIL) 800 MG tablet Take 1 tablet (800 mg total) by mouth every 8 (eight) hours as needed. 30 tablet Jodell Cipro, PA-C     PDMP not reviewed this encounter.   Jodell Cipro, PA-C 03/06/21  1719  

## 2021-03-22 ENCOUNTER — Other Ambulatory Visit: Payer: Self-pay

## 2021-03-22 ENCOUNTER — Other Ambulatory Visit (HOSPITAL_COMMUNITY)
Admission: RE | Admit: 2021-03-22 | Discharge: 2021-03-22 | Disposition: A | Discharge status: Home / Self Care | Payer: Medicaid Other | Source: Ambulatory Visit | Attending: Adult Health | Admitting: Adult Health

## 2021-03-22 ENCOUNTER — Ambulatory Visit (INDEPENDENT_AMBULATORY_CARE_PROVIDER_SITE_OTHER): Payer: Medicaid Other | Admitting: Adult Health

## 2021-03-22 ENCOUNTER — Encounter: Payer: Self-pay | Admitting: Adult Health

## 2021-03-22 VITALS — BP 135/79 | HR 111 | Ht 61.0 in | Wt 225.0 lb

## 2021-03-22 DIAGNOSIS — Z01419 Encounter for gynecological examination (general) (routine) without abnormal findings: Secondary | ICD-10-CM | POA: Diagnosis present

## 2021-03-22 DIAGNOSIS — Z01411 Encounter for gynecological examination (general) (routine) with abnormal findings: Secondary | ICD-10-CM

## 2021-03-22 DIAGNOSIS — L732 Hidradenitis suppurativa: Secondary | ICD-10-CM

## 2021-03-22 MED ORDER — DOXYCYCLINE HYCLATE 100 MG PO TABS: 100.0000 mg | ORAL_TABLET | Freq: Two times a day (BID) | ORAL | 1 refills | Status: DC

## 2021-03-22 NOTE — Progress Notes (Signed)
Patient ID: LENIYAH MARTELL, female   DOB: [birth year], 40 y.o.   MRN: [id number] History of Present Illness: Kiaria is a 40 year old black female, married, [id number], in for a well woman gyn and pap. She had heavy long period, took aygestin and it seemed worse, and stopped aygestin and period finally stopped. PCP is [facility], in [place].    Current Medications, Allergies, Past Medical History, Past Surgical History, Family History and Social History were reviewed in [employer] record.     Review of Systems: Patient denies any headaches, hearing loss, fatigue, blurred vision, shortness of breath, chest pain, abdominal pain, problems with bowel movements, urination, or intercourse. No joint pain or mood swings. Heavy long period, with clots,has stopped    Physical Exam:BP 135/79 (BP Location: Right Arm, Patient Position: Sitting, Cuff Size: Large)   Pulse (!) 111   Ht 5\' 1"  (1.549 m)   Wt 225 lb (102.1 kg)   LMP 02/25/2021   BMI 42.51 kg/m  General:  Well developed, well nourished, no acute distress Skin:  Warm and dry,has severe hidradenitis underarms, groin and butt crack and under breasts Neck:  Midline trachea, normal thyroid, good ROM, no lymphadenopathy Lungs; Clear to auscultation bilaterally Breast:  No dominant palpable mass, retraction, or nipple discharge, has scarring R>L Cardiovascular: Regular rate and rhythm Abdomen:  Soft, non tender, no hepatosplenomegaly Pelvic:  External genitalia is normal in appearance, has several areas of scarring that are draining today inner thighs.  The vagina is normal in appearance. Urethra has no lesions or masses. The cervix is bulbous, pap with GC/CHL and HR HPV genotyping performed.  Uterus is felt to be normal size, shape, and contour.  No adnexal masses or tenderness noted.Bladder is non tender, no masses felt. Extremities/musculoskeletal:  No swelling or varicosities noted, no clubbing or  cyanosis Psych:  No mood changes, alert and cooperative,seems happy AA is 2 Fall risk is low PHQ 9 score is 4,she is on lexapro and going to therapy  GAD 7 score is 5  [facility] - 03/22/21 1041      Pregnancy Intention Screening   Does the patient want to become pregnant in the next year? Unsure    Does the patient's partner want to become pregnant in the next year? Unsure    Would the patient like to discuss contraceptive options today? No      Contraception Wrap Up   Current Method Female Sterilization    End Method Female Sterilization    Contraception Counseling Provided No         Examination chaperoned by 03/24/21 LPN  Impression and Plan: 1. Encounter for gynecological examination with Papanicolaou smear of cervix Pap sent Physical in 1 year Pap in 3 if normal Labs with PCP Call if period is not good next cycle  2. Hidradenitis suppurativa Will rx doxycyline Meds ordered this encounter  Medications  . doxycycline (VIBRA-TABS) 100 MG tablet    Sig: Take 1 tablet (100 mg total) by mouth 2 (two) times daily.    Dispense:  20 tablet    Refill:  1    Order Specific Question:   Supervising Provider    Answer:   Marchelle Folks [[id number]]  Call PCP try to see surgeon

## 2021-03-24 LAB — CYTOLOGY - PAP
Adequacy: ABSENT
Chlamydia: NEGATIVE
Comment: NEGATIVE
Comment: NEGATIVE
Comment: NORMAL
Diagnosis: NEGATIVE
High risk HPV: NEGATIVE
Neisseria Gonorrhea: NEGATIVE

## 2021-04-15 NOTE — Progress Notes (Signed)
[facility] [address] [place], [place] [id number]   CLINIC:  Medical Oncology/Hematology  Patient Care Team: Inc, [facility] as PCP - General Rourk, Gerrit Friendsobert M, MD as Consulting Physician (Gastroenterology)  CHIEF COMPLAINTS/PURPOSE OF CONSULTATION:  Evaluation of IDA  HISTORY OF PRESENTING ILLNESS:  Theresa Malone 40 y.o. female is here because of an evaluation of her IDA, at the request of Dr. Ethelda Chickaroline Roberts.  Today she reports feeling okay. She reports recent onset severe fatigue. She reports her last menses lasted 1 month long; and heavy menses 3 out of 5 days of cycle after she had her her tube ligation in 2016. She does not have blood in the stools. She took iron pills during pregnancy which caused stomach upset and constipation. She has never had a blood transfusion or iron infusion. She denies unintentional weight loss. She has hidradenitis which flares up intermittently. She is currently taking doxycycline and takes it chronically.   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 She has never smoked, and she denies a family history of anemia. Her paternal grandfather had colon cancer and paternal grandmother had liver cancer. She reports sulfur causes rashes.   MEDICAL HISTORY:  Past Medical History:  Diagnosis Date  . Anxiety    panic attacks  . Cellulitis   . Depression   . Diabetes mellitus   . Gout   . Hypertension   . Obesity   . Venous stasis   . Venous stasis dermatitis     SURGICAL HISTORY: Past Surgical History:  Procedure Laterality Date  . CESAREAN SECTION     C/S x 2  . CESAREAN SECTION N/A 01/24/2015   Procedure: CESAREAN SECTION;  Surgeon: Catalina AntiguaPeggy Constant, MD;  Location: [facility];  Service: Obstetrics;  Laterality: N/A;  . TUBAL LIGATION      SOCIAL HISTORY: Social History   Socioeconomic History  . Marital status: Married    Spouse name: Not on file  . Number of children: Not on file  . Years  of education: Not on file  . Highest education level: Not on file  Occupational History  . Not on file  Tobacco Use  . Smoking status: Never Smoker  . Smokeless tobacco: Never Used  Vaping Use  . Vaping Use: Never used  Substance and Sexual Activity  . Alcohol use: Not Currently    Comment: wine occ  . Drug use: No  . Sexual activity: Yes    Birth control/protection: Surgical    Comment: tubal  Other Topics Concern  . Not on file  Social History Narrative  . Not on file   Social Determinants of Health   Financial Resource Strain: Low Risk   . Difficulty of Paying Living Expenses: Not hard at all  Food Insecurity: No Food Insecurity  . Worried About Programme researcher, broadcasting/film/videounning Out of Food in the Last Year: Never true  . Ran Out of Food in the Last Year: Never true  Transportation Needs: No Transportation Needs  . Lack of Transportation (Medical): No  . Lack of Transportation (Non-Medical): No  Physical Activity: Insufficiently Active  . Days of Exercise per Week: 2 days  . Minutes of Exercise per Session: 20 min  Stress: No Stress Concern Present  . Feeling of Stress : Not at all  Social Connections: Socially Integrated  . Frequency of Communication with Friends and Family: More than three times a week  . Frequency of Social Gatherings with Friends  and Family: More than three times a week  . Attends Religious Services: 1 to 4 times per year  . Active Member of Clubs or Organizations: No  . Attends Banker Meetings: 1 to 4 times per year  . Marital Status: Married  Catering manager Violence: Not At Risk  . Fear of Current or Ex-Partner: No  . Emotionally Abused: No  . Physically Abused: No  . Sexually Abused: No    FAMILY HISTORY: Family History  Problem Relation Age of Onset  . Diabetes Paternal Grandfather   . Cancer Paternal Grandfather        liver  . Cancer Paternal Grandmother        liver  . Hypertension Father   . Diabetes Mother   . Hypertension Mother   .  Diabetes Maternal Uncle   . Diabetes Maternal Grandmother   . Cancer Maternal Grandfather   . Diabetes Daughter        boarderline   . Asthma Daughter   . Bronchitis Daughter   . Bronchitis Son   . Bronchitis Daughter     ALLERGIES:  is allergic to sulfa antibiotics and lisinopril.  MEDICATIONS:  Current Outpatient Medications  Medication Sig Dispense Refill  . acetaminophen (TYLENOL) 325 MG tablet Take 2 tablets (650 mg total) by mouth every 6 (six) hours as needed for mild pain, fever or headache (or Fever >/= 101). 12 tablet 0  . doxycycline (VIBRA-TABS) 100 MG tablet Take 1 tablet (100 mg total) by mouth 2 (two) times daily. 20 tablet 1  . escitalopram (LEXAPRO) 10 MG tablet Take 10 mg by mouth daily.    Marland Kitchen gabapentin (NEURONTIN) 300 MG capsule Take 300 mg by mouth 2 (two) times daily.    Marland Kitchen ibuprofen (ADVIL) 800 MG tablet Take 1 tablet (800 mg total) by mouth every 8 (eight) hours as needed. 30 tablet 0  . insulin glargine (LANTUS SOLOSTAR) 100 UNIT/ML Solostar Pen Inject 56-60 Units into the skin in the morning and at bedtime.    . liraglutide (VICTOZA) 18 MG/3ML SOPN Inject 1.2 mg into the skin in the morning.     Marland Kitchen losartan (COZAAR) 50 MG tablet Take 1 tablet (50 mg total) by mouth daily.    . metFORMIN (GLUCOPHAGE) 1000 MG tablet Take 1,000 mg by mouth 2 (two) times daily with a meal.    . triamterene-hydrochlorothiazide (MAXZIDE) 75-50 MG tablet Take 1 tablet by mouth daily.    . hydrOXYzine (ATARAX/VISTARIL) 25 MG tablet Take 25 mg by mouth 3 (three) times daily as needed. (Patient not taking: Reported on 04/17/2021)    . Naproxen Sodium (ALEVE PO) Take by mouth as needed. (Patient not taking: Reported on 03/22/2021)    . traMADol (ULTRAM) 50 MG tablet Take 50 mg by mouth every 6 (six) hours as needed. (Patient not taking: No sig reported)    . traZODone (DESYREL) 100 MG tablet Take 1 tablet (100 mg total) by mouth at bedtime as needed for sleep. (Patient not taking: Reported on  04/17/2021) 30 tablet 1   No current facility-administered medications for this visit.    REVIEW OF SYSTEMS:   Review of Systems  Constitutional: Positive for appetite change (75%) and fatigue (depleted). Negative for unexpected weight change.  Gastrointestinal: Positive for blood in stool and nausea.  Neurological: Positive for dizziness and numbness (thumbs).  All other systems reviewed and are negative.    PHYSICAL EXAMINATION: ECOG PERFORMANCE STATUS: 0 - Asymptomatic  Vitals:   04/17/21 2595  BP: 129/86  Pulse: 87  Resp: 18  Temp: (!) 96.9 F (36.1 C)  SpO2: 100%   Filed Weights   04/17/21 0808  Weight: 240 lb (108.9 kg)   Physical Exam Vitals reviewed.  Constitutional:      Appearance: Normal appearance.  Cardiovascular:     Rate and Rhythm: Normal rate and regular rhythm.     Pulses: Normal pulses.     Heart sounds: Normal heart sounds.  Pulmonary:     Effort: Pulmonary effort is normal.     Breath sounds: Normal breath sounds.  Chest:  Breasts:     Right: No supraclavicular adenopathy.     Left: No supraclavicular adenopathy.    Abdominal:     Palpations: There is no hepatomegaly, splenomegaly or mass.     Tenderness: There is no abdominal tenderness.  Musculoskeletal:     Right lower leg: No edema.     Left lower leg: No edema.  Lymphadenopathy:     Cervical: No cervical adenopathy.     Right cervical: No superficial cervical adenopathy.    Left cervical: No superficial cervical adenopathy.     Upper Body:     Right upper body: No supraclavicular adenopathy.     Left upper body: No supraclavicular adenopathy.  Neurological:     General: No focal deficit present.     Mental Status: She is alert and oriented to person, place, and time.  Psychiatric:        Mood and Affect: Mood normal.        Behavior: Behavior normal.      LABORATORY DATA:  I have reviewed the data as listed Recent Results (from the past 2160 hour(s))  Cytology - PAP(  [place])     Status: None   Collection Time: 03/22/21 10:31 AM  Result Value Ref Range   High risk HPV Negative    Neisseria Gonorrhea Negative    Chlamydia Negative    Adequacy      Satisfactory for evaluation; transformation zone component ABSENT.   Diagnosis      - Negative for intraepithelial lesion or malignancy (NILM)   Comment Normal Reference Ranger Chlamydia - Negative    Comment      Normal Reference Range Neisseria Gonorrhea - Negative   Comment Normal Reference Range HPV - Negative     RADIOGRAPHIC STUDIES: I have personally reviewed the radiological images as listed and agreed with the findings in the report. No results found.  ASSESSMENT:  1. Iron deficiency anemia: - Patient seen at the request of Dr. Ethelda Chick for further management of microcytic anemia. - Labs on 03/21/2021 shows hemoglobin 9.1, MCV 78.  Ferritin was 106 and percent saturation was 11.  Platelet count elevated at 483 and mildly elevated white count 12.3 with increased absolute neutrophil and monocyte counts. - CBC on epic on 09/23/2020 with hemoglobin 7.3 and MCV 82. - She also has mildly elevated creatinine in October. - She has regular heavy menses, 5/28, 3 heavy days of bleeding.  No pica symptoms. - Never had a colonoscopy.  Denies any bleeding per rectum or melena.  No prior history of blood transfusion.  No prior parenteral iron therapy. - Took iron tablet when she was pregnant and had severe constipation and stomach upset from it.  2.  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  Non-smoker. - Paternal grandfather had colon cancer.  Paternal grandmother had liver cancer.  No family history  of thalassemia or sickle cell anemia.   PLAN:  1. Iron deficiency anemia: - Reviewed labs from 03/21/2021. - As she was unable to tolerate oral iron therapy in the past, I have recommended parenteral iron therapy. - We will give her Venofer for total of 1  g.  We discussed the side effects in detail. - I plan to repeat her labs in 6 to 8 weeks and follow-up after that.  We will also check for other nutritional deficiencies including B12, folic acid, methylmalonic acid, LDH, reticulocyte count and serum protein electrophoresis.  We plan to repeat ferritin and iron panel and CBC at that time.  2.  Leukocytosis: - He has elevated white count on and off since May 2020. - It is predominantly neutrophils and monocytes. - He also has flareups of hidradenitis for which she is on doxycycline.  This could be causing her leukocytosis.  We will closely monitor.  If there is any significant changes, will consider myeloproliferative disorder work-up.  3.  Thrombocytosis: - This is likely reactive from hidradenitis and anemia.   All questions were answered. The patient knows to call the clinic with any problems, questions or concerns.   Doreatha Massed, MD 04/17/21 11:57 AM  [facility] [phone]   I, Alda Ponder, am acting as a scribe for Dr. Doreatha Massed.  I, Doreatha Massed MD, have reviewed the above documentation for accuracy and completeness, and I agree with the above.

## 2021-04-17 ENCOUNTER — Other Ambulatory Visit: Payer: Self-pay

## 2021-04-17 ENCOUNTER — Inpatient Hospital Stay (HOSPITAL_COMMUNITY): Payer: Medicaid Other | Attending: Hematology | Admitting: Hematology

## 2021-04-17 ENCOUNTER — Ambulatory Visit (HOSPITAL_COMMUNITY): Payer: Medicaid Other | Admitting: Hematology

## 2021-04-17 ENCOUNTER — Encounter (HOSPITAL_COMMUNITY): Payer: Self-pay | Admitting: Hematology

## 2021-04-17 VITALS — BP 129/86 | HR 87 | Temp 96.9°F | Resp 18 | Ht 61.0 in | Wt 240.0 lb

## 2021-04-17 DIAGNOSIS — D500 Iron deficiency anemia secondary to blood loss (chronic): Secondary | ICD-10-CM

## 2021-04-17 DIAGNOSIS — Z79899 Other long term (current) drug therapy: Secondary | ICD-10-CM | POA: Insufficient documentation

## 2021-04-17 DIAGNOSIS — D509 Iron deficiency anemia, unspecified: Secondary | ICD-10-CM | POA: Diagnosis not present

## 2021-04-17 DIAGNOSIS — D72829 Elevated white blood cell count, unspecified: Secondary | ICD-10-CM | POA: Insufficient documentation

## 2021-04-17 DIAGNOSIS — D75839 Thrombocytosis, unspecified: Secondary | ICD-10-CM | POA: Insufficient documentation

## 2021-04-17 NOTE — Addendum Note (Signed)
Addended by: Pryor Ochoa E on: 04/17/2021 01:46 PM   Modules accepted: Orders

## 2021-04-17 NOTE — Patient Instructions (Signed)
[facility] at [facility] Discharge Instructions  You were seen and examined today by Dr. Ellin Saba. Dr. Ellin Saba is a hematologist, meaning he specializes in blood disorders. Dr. Ellin Saba discussed your past medical history, family history of cancers/blood disorders and the events that led to you being here today.  You were referred to Dr. Ellin Saba for iron deficiency anemia. Dr. Ellin Saba recommended replacing your iron with IV iron infusions and then rechecking your lab work in about 7 weeks and follow-up in about 8 weeks.  IV iron is typically well tolerated, please take 25mg  benadryl and extra strength tylenol about an hour before your infusion appt to prevent reactions. You will be monitored for about 30 minutes following each iron infusions.   Thank you for choosing [facility] at [facility] to provide your oncology and hematology care.  To afford each patient quality time with our provider, please arrive at least 15 minutes before your scheduled appointment time.   If you have a lab appointment with the Cancer Center please come in thru the Main Entrance and check in at the main information desk.  You need to re-schedule your appointment should you arrive 10 or more minutes late.  We strive to give you quality time with our providers, and arriving late affects you and other patients whose appointments are after yours.  Also, if you no show three or more times for appointments you may be dismissed from the clinic at the providers discretion.     Again, thank you for choosing [facility].  Our hope is that these requests will decrease the amount of time that you wait before being seen by our physicians.       _____________________________________________________________  Should you have questions after your visit to [facility], please contact our office at [phone] and follow the prompts.  Our office hours  are 8:00 a.m. and 4:30 p.m. Monday - Friday.  Please note that voicemails left after 4:00 p.m. may not be returned until the following business day.  We are closed weekends and major holidays.  You do have access to a nurse 24-7, just call the main number to the clinic [phone] and do not press any options, hold on the line and a nurse will answer the phone.    For prescription refill requests, have your pharmacy contact our office and allow 72 hours.    Due to Covid, you will need to wear a mask upon entering the hospital. If you do not have a mask, a mask will be given to you at the Main Entrance upon arrival. For doctor visits, patients may have 1 support person age 43 or older with them. For treatment visits, patients can not have anyone with them due to social distancing guidelines and our immunocompromised population.

## 2021-04-19 ENCOUNTER — Ambulatory Visit (HOSPITAL_COMMUNITY): Payer: Medicaid Other | Admitting: Hematology

## 2021-04-20 ENCOUNTER — Inpatient Hospital Stay (HOSPITAL_COMMUNITY): Payer: Medicaid Other

## 2021-04-20 ENCOUNTER — Other Ambulatory Visit: Payer: Self-pay

## 2021-04-20 VITALS — BP 124/73 | HR 93 | Temp 97.6°F | Resp 18

## 2021-04-20 DIAGNOSIS — D509 Iron deficiency anemia, unspecified: Secondary | ICD-10-CM

## 2021-04-20 MED ORDER — FAMOTIDINE 20 MG PO TABS
20.0000 mg | ORAL_TABLET | Freq: Once | ORAL | Status: AC
  Administered 2021-04-20: 20 mg via ORAL

## 2021-04-20 MED ORDER — SODIUM CHLORIDE 0.9 % IV SOLN
300.0000 mg | Freq: Once | INTRAVENOUS | Status: AC
  Administered 2021-04-20: 300 mg via INTRAVENOUS
  Filled 2021-04-20: qty 15

## 2021-04-20 MED ORDER — FAMOTIDINE 20 MG PO TABS
ORAL_TABLET | ORAL | Status: AC
  Filled 2021-04-20: qty 1

## 2021-04-20 MED ORDER — LORATADINE 10 MG PO TABS
10.0000 mg | ORAL_TABLET | Freq: Once | ORAL | Status: AC
Start: 2021-04-20 — End: 2021-04-20
  Administered 2021-04-20: 10 mg via ORAL

## 2021-04-20 MED ORDER — SODIUM CHLORIDE 0.9 % IV SOLN: Freq: Once | INTRAVENOUS | Status: AC

## 2021-04-20 MED ORDER — LORATADINE 10 MG PO TABS
ORAL_TABLET | ORAL | Status: AC
  Filled 2021-04-20: qty 1

## 2021-04-20 MED ORDER — ACETAMINOPHEN 325 MG PO TABS
650.0000 mg | ORAL_TABLET | Freq: Once | ORAL | Status: AC
  Administered 2021-04-20: 650 mg via ORAL

## 2021-04-20 MED ORDER — ACETAMINOPHEN 325 MG PO TABS
ORAL_TABLET | ORAL | Status: AC
  Filled 2021-04-20: qty 2

## 2021-04-20 NOTE — Patient Instructions (Signed)
[facility]  Discharge Instructions: Thank you for choosing [facility] to provide your oncology and hematology care.  If you have a lab appointment with the Cancer Center, please come in thru the Main Entrance and check in at the main information desk.  Wear comfortable clothing and clothing appropriate for easy access to any Portacath or PICC line.   We strive to give you quality time with your provider. You may need to reschedule your appointment if you arrive late (15 or more minutes).  Arriving late affects you and other patients whose appointments are after yours.  Also, if you miss three or more appointments without notifying the office, you may be dismissed from the clinic at the provider's discretion.      For prescription refill requests, have your pharmacy contact our office and allow 72 hours for refills to be completed.        To help prevent nausea and vomiting after your treatment, we encourage you to take your nausea medication as directed.  BELOW ARE SYMPTOMS THAT SHOULD BE REPORTED IMMEDIATELY: *FEVER GREATER THAN 100.4 F (38 C) OR HIGHER *CHILLS OR SWEATING *NAUSEA AND VOMITING THAT IS NOT CONTROLLED WITH YOUR NAUSEA MEDICATION *UNUSUAL SHORTNESS OF BREATH *UNUSUAL BRUISING OR BLEEDING *URINARY PROBLEMS (pain or burning when urinating, or frequent urination) *BOWEL PROBLEMS (unusual diarrhea, constipation, pain near the anus) TENDERNESS IN MOUTH AND THROAT WITH OR WITHOUT PRESENCE OF ULCERS (sore throat, sores in mouth, or a toothache) UNUSUAL RASH, SWELLING OR PAIN  UNUSUAL VAGINAL DISCHARGE OR ITCHING   Items with * indicate a potential emergency and should be followed up as soon as possible or go to the Emergency Department if any problems should occur.  Please show the CHEMOTHERAPY ALERT CARD or IMMUNOTHERAPY ALERT CARD at check-in to the Emergency Department and triage nurse.  Should you have questions after your visit or need to cancel  or reschedule your appointment, please contact [facility] [phone]  and follow the prompts.  Office hours are 8:00 a.m. to 4:30 p.m. Monday - Friday. Please note that voicemails left after 4:00 p.m. may not be returned until the following business day.  We are closed weekends and major holidays. You have access to a nurse at all times for urgent questions. Please call the main number to the clinic [phone] and follow the prompts.  For any non-urgent questions, you may also contact your provider using MyChart. We now offer e-Visits for anyone 18 and older to request care online for non-urgent symptoms. For details visit [email or web address].   Also download the MyChart app! Go to the app store, search "MyChart", open the app, select [place], and log in with your MyChart username and password.  Due to Covid, a mask is required upon entering the hospital/clinic. If you do not have a mask, one will be given to you upon arrival. For doctor visits, patients may have 1 support person aged 18 or older with them. For treatment visits, patients cannot have anyone with them due to current Covid guidelines and our immunocompromised population.  

## 2021-04-20 NOTE — Progress Notes (Signed)
Patient presents today for Venofer infusion.  Vital signs WNL.  Patients only complaint is fatigue.    Peripheral IV started and blood return noted pre and post infusion.  Venofer infusion given today per MD orders.  Stable during infusion without adverse affects.  Vital signs stable.  No complaints at this time.  Discharge from clinic ambulatory in stable condition.  Alert and oriented X 3.  Follow up with [facility] as scheduled.

## 2021-04-25 ENCOUNTER — Ambulatory Visit: Payer: Medicaid Other | Admitting: Adult Health

## 2021-04-27 ENCOUNTER — Other Ambulatory Visit: Payer: Self-pay

## 2021-04-27 ENCOUNTER — Encounter (HOSPITAL_COMMUNITY): Payer: Self-pay | Admitting: Hematology

## 2021-04-27 ENCOUNTER — Encounter: Payer: Self-pay | Admitting: Emergency Medicine

## 2021-04-27 ENCOUNTER — Ambulatory Visit
Admission: EM | Admit: 2021-04-27 | Discharge: 2021-04-27 | Disposition: A | Discharge status: Home / Self Care | Payer: Medicaid Other | Attending: Family Medicine | Admitting: Family Medicine

## 2021-04-27 DIAGNOSIS — Z20822 Contact with and (suspected) exposure to covid-19: Secondary | ICD-10-CM

## 2021-04-27 MED ORDER — BENZONATATE 200 MG PO CAPS: 200.0000 mg | ORAL_CAPSULE | Freq: Two times a day (BID) | ORAL | 0 refills | Status: DC | PRN

## 2021-04-27 MED ORDER — KETOROLAC TROMETHAMINE 10 MG PO TABS: 10.0000 mg | ORAL_TABLET | Freq: Four times a day (QID) | ORAL | 0 refills | Status: DC | PRN

## 2021-04-27 NOTE — Discharge Instructions (Addendum)
If your Covid test ends up positive you may take the following supplements to help your immune system be stronger to fight this viral infection Take Quarcetin 500 mg three times a day x 7 days with Zinc 50 mg ones a day x 7 days. The quarcetin is an antiviral and anti-inflammatory supplement which helps open the zinc channels in the cell to absorb Zinc. Zinc helps decrease the virus load in your body. Take Melatonin 6-10 mg at bed time which also helps support your immune system.  Also make sure to take Vit D 5,000 IU per day with a fatty meal and Vit C 5000 mg a day until you are completely better. To prevent viral illnesses your vitamin D should be between 60-80. Stay on Vitamin D 2,000  and C  1000 mg the rest of the season.  Don't lay around, keep active and walk as much as you are able to to prevent worsening of your symptoms.  Follow up with your family Dr next week.  If you get short of breath and you are able to check  your oxygen with a pulse oxygen meter, if it gets to 92% or less, you need to go to the hospital to be admitted. If you dont have one, come back here and we will assess you.   

## 2021-04-27 NOTE — ED Provider Notes (Signed)
[facility]    CSN: [id number] Arrival date & time: 04/27/21  5852      History   Chief Complaint No chief complaint on file.   HPI Theresa Malone is a 40 y.o. female who presents with onset of ST, chest pain from cough since yesterday. Her husband tested positive for Covid 2 days ago.  Covid test this am at work was neg     Past Medical History:  Diagnosis Date  . Anxiety    panic attacks  . Cellulitis   . Depression   . Diabetes mellitus   . Gout   . Hypertension   . Obesity   . Venous stasis   . Venous stasis dermatitis     Patient Active Problem List   Diagnosis Date Noted  . Microcytic anemia 04/17/2021  . Encounter for gynecological examination with Papanicolaou smear of cervix 03/22/2021  . Dysmenorrhea 12/15/2020  . Encounter for IUD removal 12/15/2020  . Sepsis without acute organ dysfunction (HCC)   . Acute pyelonephritis 09/22/2020  . Pyelonephritis 09/22/2020  . Emesis, persistent 05/21/2019  . Cellulitis   . Intractable vomiting with nausea 04/13/2019  . Acute respiratory alkalosis   . Intractable vomiting   . Chest pain   . Cellulitis of right lower extremity 04/12/2019  . Intractable abdominal pain 04/11/2019  . Metabolic alkalosis 04/11/2019  . Respiratory alkalosis 04/11/2019  . Volume depletion 04/11/2019  . Hypokalemia 04/11/2019  . Hyponatremia 04/11/2019  . Yeast vaginitis 03/13/2018  . Type 2 diabetes mellitus, uncontrolled 02/07/2016  . Chronic hypertension 02/07/2016  . Menorrhagia with regular cycle 02/07/2016  . Frequent loose stools 02/07/2016  . Status post cesarean section 01/25/2015  . Hidradenitis axillaris 09/15/2014  . Susceptible to varicella (non-immune), currently pregnant 07/24/2014  . Hidradenitis suppurativa 07/20/2014  . Morbid obesity with BMI of 45.0-49.9, adult (HCC) 03/03/2014    Past Surgical History:  Procedure Laterality Date  . CESAREAN SECTION     C/S x 2  . CESAREAN SECTION N/A  01/24/2015   Procedure: CESAREAN SECTION;  Surgeon: Catalina Antigua, MD;  Location: [facility];  Service: Obstetrics;  Laterality: N/A;  . TUBAL LIGATION      OB History    Gravida  4   Para  4   Term  2   Preterm  2   AB      Living  4     SAB      IAB      Ectopic      Multiple  0   Live Births  4            Home Medications    Prior to Admission medications   Medication Sig Start Date End Date Taking? Authorizing Provider  acetaminophen (TYLENOL) 325 MG tablet Take 2 tablets (650 mg total) by mouth every 6 (six) hours as needed for mild pain, fever or headache (or Fever >/= 101). 05/22/19   Emokpae, Courage, MD  doxycycline (VIBRA-TABS) 100 MG tablet Take 1 tablet (100 mg total) by mouth 2 (two) times daily. 03/22/21   Adline Potter, NP  escitalopram (LEXAPRO) 10 MG tablet Take 10 mg by mouth daily.    [provider]  gabapentin (NEURONTIN) 300 MG capsule Take 300 mg by mouth 2 (two) times daily.    [provider]  hydrOXYzine (ATARAX/VISTARIL) 25 MG tablet Take 25 mg by mouth 3 (three) times daily as needed.    [provider]  ibuprofen (ADVIL) 800  MG tablet Take 1 tablet (800 mg total) by mouth every 8 (eight) hours as needed. 03/05/21   Zanetto, Shanda Bumps, PA-C  insulin glargine (LANTUS SOLOSTAR) 100 UNIT/ML Solostar Pen Inject 56-60 Units into the skin in the morning and at bedtime.    [provider]  liraglutide (VICTOZA) 18 MG/3ML SOPN Inject 1.2 mg into the skin in the morning.     [provider]  losartan (COZAAR) 50 MG tablet Take 1 tablet (50 mg total) by mouth daily. 09/26/20   Vassie Loll, MD  metFORMIN (GLUCOPHAGE) 1000 MG tablet Take 1,000 mg by mouth 2 (two) times daily with a meal.    [provider]  Naproxen Sodium (ALEVE PO) Take by mouth as needed.    [provider]  traMADol (ULTRAM) 50 MG tablet Take 50 mg by mouth every 6 (six) hours as needed.    [provider]   traZODone (DESYREL) 100 MG tablet Take 1 tablet (100 mg total) by mouth at bedtime as needed for sleep. 05/22/19   Shon Hale, MD  triamterene-hydrochlorothiazide (MAXZIDE) 75-50 MG tablet Take 1 tablet by mouth daily. 09/26/20   Vassie Loll, MD  clonazepam (KLONOPIN) 0.125 MG disintegrating tablet Take 1 tablet (0.125 mg total) by mouth 2 (two) times daily. Patient not taking: Reported on 05/19/2019 04/15/19 05/21/19  Catarina Hartshorn, MD    Family History Family History  Problem Relation Age of Onset  . Diabetes Paternal Grandfather   . Cancer Paternal Grandfather        liver  . Cancer Paternal Grandmother        liver  . Hypertension Father   . Diabetes Mother   . Hypertension Mother   . Diabetes Maternal Uncle   . Diabetes Maternal Grandmother   . Cancer Maternal Grandfather   . Diabetes Daughter        boarderline   . Asthma Daughter   . Bronchitis Daughter   . Bronchitis Son   . Bronchitis Daughter     Social History Social History   Tobacco Use  . Smoking status: Never Smoker  . Smokeless tobacco: Never Used  Vaping Use  . Vaping Use: Never used  Substance Use Topics  . Alcohol use: Not Currently    Comment: wine occ  . Drug use: No     Allergies   Sulfa antibiotics and Lisinopril   Review of Systems Review of Systems  Constitutional: Positive for chills, diaphoresis and fatigue. Negative for activity change and fever.  HENT: Positive for sore throat. Negative for congestion, ear discharge, ear pain, postnasal drip, rhinorrhea and trouble swallowing.   Eyes: Negative for discharge.  Respiratory: Positive for cough. Negative for chest tightness and shortness of breath.   Cardiovascular: Negative for chest pain.  Gastrointestinal: Negative for diarrhea, nausea and vomiting.  Musculoskeletal: Negative for myalgias.  Skin: Negative for rash.  Neurological: Positive for headaches.  Hematological: Negative for adenopathy.     Physical Exam Triage Vital  Signs ED Triage Vitals [04/27/21 0823]  Enc Vitals Group     BP 137/88     Pulse Rate 90     Resp 18     Temp 97.6 F (36.4 C)     Temp Source Temporal     SpO2 96 %     Weight      Height      Head Circumference      Peak Flow      Pain Score 7     Pain  Loc      Pain Edu?      Excl. in GC?    No data found.  Updated Vital Signs BP 137/88 (BP Location: Right Arm)   Pulse 90   Temp 97.6 F (36.4 C) (Temporal)   Resp 18   LMP 04/13/2021   SpO2 96%   Visual Acuity Right Eye Distance:   Left Eye Distance:   Bilateral Distance:    Right Eye Near:   Left Eye Near:    Bilateral Near:     Physical Exam Physical Exam Vitals signs and nursing note reviewed.  Constitutional:      General: She is not in acute distress.    Appearance: Normal appearance. She is not ill-appearing, toxic-appearing or diaphoretic.  HENT:     Head: Normocephalic.     Right Ear: Tympanic membrane, ear canal and external ear normal.     Left Ear: Tympanic membrane, ear canal and external ear normal.     Nose: Nose normal.     Mouth/Throat:     Mouth: Mucous membranes are moist.  Eyes:     General: No scleral icterus.       Right eye: No discharge.        Left eye: No discharge.     Conjunctiva/sclera: Conjunctivae normal.  Neck:     Musculoskeletal: Neck supple. No neck rigidity.  Cardiovascular:     Rate and Rhythm: Normal rate and regular rhythm.     Heart sounds: No murmur.  Pulmonary:     Effort: Pulmonary effort is normal.     Breath sounds: Normal breath sounds.   Musculoskeletal: Normal range of motion.  Lymphadenopathy:     Cervical: No cervical adenopathy.  Skin:    General: Skin is warm and dry.     Coloration: Skin is not jaundiced.     Findings: No rash.  Neurological:     Mental Status: She is alert and oriented to person, place, and time.     Gait: Gait normal.  Psychiatric:        Mood and Affect: Mood normal.        Behavior: Behavior normal.        Thought  Content: Thought content normal.        Judgment: Judgment normal.     UC Treatments / Results  Labs (all labs ordered are listed, but only abnormal results are displayed) Labs Reviewed  NOVEL CORONAVIRUS, NAA    EKG   Radiology No results found.  Procedures Procedures (including critical care time)  Medications Ordered in UC Medications - No data to display  Initial Impression / Assessment and Plan / UC Course  I have reviewed the triage vital signs and the nursing notes. Covid test is pending. I sent tessalon perless for her cough as noted.  Supportive care advised in the mean time. See instructions.  Final Clinical Impressions(s) / UC Diagnoses   Final diagnoses:  Exposure to COVID-19 virus   Discharge Instructions   None    ED Prescriptions    None     PDMP not reviewed this encounter.   Garey Ham, [place] 04/27/21 9622

## 2021-04-27 NOTE — ED Triage Notes (Signed)
Husband tested positive for covid on Tuesday.  C/o sore throat, cough with chest pain, headache.  Symptoms started yesterday.

## 2021-04-28 ENCOUNTER — Inpatient Hospital Stay (HOSPITAL_COMMUNITY): Payer: Medicaid Other

## 2021-04-28 LAB — NOVEL CORONAVIRUS, NAA: SARS-CoV-2, NAA: NOT DETECTED

## 2021-04-28 LAB — SARS-COV-2, NAA 2 DAY TAT

## 2021-05-04 ENCOUNTER — Ambulatory Visit (HOSPITAL_COMMUNITY): Payer: Medicaid Other

## 2021-05-18 ENCOUNTER — Inpatient Hospital Stay (HOSPITAL_COMMUNITY): Payer: Medicaid Other | Attending: Hematology

## 2021-05-18 ENCOUNTER — Encounter (HOSPITAL_COMMUNITY): Payer: Self-pay

## 2021-05-18 ENCOUNTER — Other Ambulatory Visit: Payer: Self-pay

## 2021-05-18 VITALS — BP 143/90 | HR 76 | Temp 97.0°F | Resp 18

## 2021-05-18 DIAGNOSIS — Z79899 Other long term (current) drug therapy: Secondary | ICD-10-CM | POA: Insufficient documentation

## 2021-05-18 DIAGNOSIS — D509 Iron deficiency anemia, unspecified: Secondary | ICD-10-CM

## 2021-05-18 MED ORDER — FAMOTIDINE 20 MG PO TABS
ORAL_TABLET | ORAL | Status: AC
  Filled 2021-05-18: qty 1

## 2021-05-18 MED ORDER — SODIUM CHLORIDE 0.9 % IV SOLN: Freq: Once | INTRAVENOUS | Status: AC

## 2021-05-18 MED ORDER — SODIUM CHLORIDE 0.9 % IV SOLN
300.0000 mg | Freq: Once | INTRAVENOUS | Status: AC
  Administered 2021-05-18: 300 mg via INTRAVENOUS
  Filled 2021-05-18: qty 300

## 2021-05-18 MED ORDER — ACETAMINOPHEN 325 MG PO TABS
ORAL_TABLET | ORAL | Status: AC
  Filled 2021-05-18: qty 2

## 2021-05-18 MED ORDER — ACETAMINOPHEN 325 MG PO TABS
650.0000 mg | ORAL_TABLET | Freq: Once | ORAL | Status: AC
  Administered 2021-05-18: 650 mg via ORAL

## 2021-05-18 MED ORDER — FAMOTIDINE 20 MG PO TABS
20.0000 mg | ORAL_TABLET | Freq: Once | ORAL | Status: AC
  Administered 2021-05-18: 20 mg via ORAL

## 2021-05-18 MED ORDER — LORATADINE 10 MG PO TABS
ORAL_TABLET | ORAL | Status: AC
  Filled 2021-05-18: qty 1

## 2021-05-18 MED ORDER — LORATADINE 10 MG PO TABS
10.0000 mg | ORAL_TABLET | Freq: Once | ORAL | Status: AC
  Administered 2021-05-18: 10 mg via ORAL

## 2021-05-18 NOTE — Patient Instructions (Signed)
[facility]  Discharge Instructions: Thank you for choosing [facility] to provide your oncology and hematology care.  If you have a lab appointment with the Cancer Center, please come in thru the Main Entrance and check in at the main information desk.  We strive to give you quality time with your provider. You may need to reschedule your appointment if you arrive late (15 or more minutes).  Arriving late affects you and other patients whose appointments are after yours.  Also, if you miss three or more appointments without notifying the office, you may be dismissed from the clinic at the provider's discretion.      For prescription refill requests, have your pharmacy contact our office and allow 72 hours for refills to be completed.    Today you received the following Venofer 300 mg IV infusion.       BELOW ARE SYMPTOMS THAT SHOULD BE REPORTED IMMEDIATELY: *FEVER GREATER THAN 100.4 F (38 C) OR HIGHER *CHILLS OR SWEATING *NAUSEA AND VOMITING THAT IS NOT CONTROLLED WITH YOUR NAUSEA MEDICATION *UNUSUAL SHORTNESS OF BREATH *UNUSUAL BRUISING OR BLEEDING *URINARY PROBLEMS (pain or burning when urinating, or frequent urination) *BOWEL PROBLEMS (unusual diarrhea, constipation, pain near the anus) TENDERNESS IN MOUTH AND THROAT WITH OR WITHOUT PRESENCE OF ULCERS (sore throat, sores in mouth, or a toothache) UNUSUAL RASH, SWELLING OR PAIN  UNUSUAL VAGINAL DISCHARGE OR ITCHING   Items with * indicate a potential emergency and should be followed up as soon as possible or go to the Emergency Department if any problems should occur.   Should you have questions after your visit or need to cancel or reschedule your appointment, please contact [facility] [phone]  and follow the prompts.  Office hours are 8:00 a.m. to 4:30 p.m. Monday - Friday. Please note that voicemails left after 4:00 p.m. may not be returned until the following business day.  We are closed  weekends and major holidays. You have access to a nurse at all times for urgent questions. Please call the main number to the clinic [phone] and follow the prompts.  For any non-urgent questions, you may also contact your provider using MyChart. We now offer e-Visits for anyone 65 and older to request care online for non-urgent symptoms. For details visit [email or web address].   Also download the MyChart app! Go to the app store, search "MyChart", open the app, select [facility], and log in with your MyChart username and password.  Due to Covid, a mask is required upon entering the hospital/clinic. If you do not have a mask, one will be given to you upon arrival. For doctor visits, patients may have 1 support person aged 36 or older with them. For treatment visits, patients cannot have anyone with them due to current Covid guidelines and our immunocompromised population.

## 2021-05-18 NOTE — Progress Notes (Signed)
Patient presents today for IV iron infusion. Venofer 300 mgs Vital signs are stable. Patient denies any changes since the last iron infusion. Patient denies any complaints today. MAR reviewed and updated.    Venofer infusion given today per MD orders. Tolerated infusion without adverse affects. Vital signs stable. No complaints at this time. Discharged from clinic ambulatory in stable condition. Alert and oriented x 3. F/U with [facility] as scheduled.

## 2021-05-22 ENCOUNTER — Inpatient Hospital Stay (HOSPITAL_COMMUNITY): Payer: Medicaid Other

## 2021-05-22 VITALS — BP 128/80 | HR 70 | Temp 96.8°F | Resp 17

## 2021-05-22 DIAGNOSIS — D509 Iron deficiency anemia, unspecified: Secondary | ICD-10-CM | POA: Diagnosis not present

## 2021-05-22 MED ORDER — SODIUM CHLORIDE 0.9 % IV SOLN: Freq: Once | INTRAVENOUS | Status: AC

## 2021-05-22 MED ORDER — FAMOTIDINE 20 MG PO TABS
20.0000 mg | ORAL_TABLET | Freq: Once | ORAL | Status: AC
  Administered 2021-05-22: 20 mg via ORAL

## 2021-05-22 MED ORDER — LORATADINE 10 MG PO TABS
ORAL_TABLET | ORAL | Status: AC
  Filled 2021-05-22: qty 1

## 2021-05-22 MED ORDER — ACETAMINOPHEN 325 MG PO TABS
650.0000 mg | ORAL_TABLET | Freq: Once | ORAL | Status: AC
  Administered 2021-05-22: 650 mg via ORAL

## 2021-05-22 MED ORDER — FAMOTIDINE 20 MG PO TABS
ORAL_TABLET | ORAL | Status: AC
  Filled 2021-05-22: qty 1

## 2021-05-22 MED ORDER — ACETAMINOPHEN 325 MG PO TABS
ORAL_TABLET | ORAL | Status: AC
  Filled 2021-05-22: qty 2

## 2021-05-22 MED ORDER — LORATADINE 10 MG PO TABS
10.0000 mg | ORAL_TABLET | Freq: Once | ORAL | Status: AC
  Administered 2021-05-22: 10 mg via ORAL

## 2021-05-22 MED ORDER — SODIUM CHLORIDE 0.9 % IV SOLN
400.0000 mg | Freq: Once | INTRAVENOUS | Status: AC
  Administered 2021-05-22: 400 mg via INTRAVENOUS
  Filled 2021-05-22: qty 20

## 2021-05-22 NOTE — Progress Notes (Signed)
Pt tolerated iron infusion well without incidence today. Stable during and after treatment. VSS at discharge AVS reviewed.

## 2021-06-08 ENCOUNTER — Inpatient Hospital Stay (HOSPITAL_COMMUNITY): Payer: Medicaid Other

## 2021-06-09 ENCOUNTER — Inpatient Hospital Stay (HOSPITAL_COMMUNITY): Payer: Medicaid Other

## 2021-06-14 ENCOUNTER — Other Ambulatory Visit: Payer: Self-pay

## 2021-06-14 ENCOUNTER — Inpatient Hospital Stay (HOSPITAL_COMMUNITY): Payer: Medicaid Other | Attending: Hematology

## 2021-06-14 ENCOUNTER — Other Ambulatory Visit (HOSPITAL_COMMUNITY): Payer: Medicaid Other

## 2021-06-14 DIAGNOSIS — D500 Iron deficiency anemia secondary to blood loss (chronic): Secondary | ICD-10-CM

## 2021-06-14 DIAGNOSIS — D509 Iron deficiency anemia, unspecified: Secondary | ICD-10-CM | POA: Diagnosis not present

## 2021-06-14 DIAGNOSIS — Z79899 Other long term (current) drug therapy: Secondary | ICD-10-CM | POA: Insufficient documentation

## 2021-06-14 LAB — RETICULOCYTES
Immature Retic Fract: 34.2 % — ABNORMAL HIGH (ref 2.3–15.9)
RBC.: 3.01 MIL/uL — ABNORMAL LOW (ref 3.87–5.11)
Retic Count, Absolute: 100.5 10*3/uL (ref 19.0–186.0)
Retic Ct Pct: 3.3 % — ABNORMAL HIGH (ref 0.4–3.1)

## 2021-06-14 LAB — CBC WITH DIFFERENTIAL/PLATELET
Abs Immature Granulocytes: 0.16 10*3/uL — ABNORMAL HIGH (ref 0.00–0.07)
Basophils Absolute: 0 10*3/uL (ref 0.0–0.1)
Basophils Relative: 0 %
Eosinophils Absolute: 0.4 10*3/uL (ref 0.0–0.5)
Eosinophils Relative: 5 %
HCT: 27.1 % — ABNORMAL LOW (ref 36.0–46.0)
Hemoglobin: 8.8 g/dL — ABNORMAL LOW (ref 12.0–15.0)
Immature Granulocytes: 2 %
Lymphocytes Relative: 15 %
Lymphs Abs: 1.4 10*3/uL (ref 0.7–4.0)
MCH: 28.9 pg (ref 26.0–34.0)
MCHC: 32.5 g/dL (ref 30.0–36.0)
MCV: 89.1 fL (ref 80.0–100.0)
Monocytes Absolute: 0.7 10*3/uL (ref 0.1–1.0)
Monocytes Relative: 8 %
Neutro Abs: 6.7 10*3/uL (ref 1.7–7.7)
Neutrophils Relative %: 70 %
Platelets: 325 10*3/uL (ref 150–400)
RBC: 3.04 MIL/uL — ABNORMAL LOW (ref 3.87–5.11)
RDW: 16.6 % — ABNORMAL HIGH (ref 11.5–15.5)
WBC: 9.4 10*3/uL (ref 4.0–10.5)
nRBC: 0 % (ref 0.0–0.2)

## 2021-06-14 LAB — FOLATE: Folate: 4.3 ng/mL — ABNORMAL LOW (ref >5.9)

## 2021-06-14 LAB — COMPREHENSIVE METABOLIC PANEL
ALT: 19 U/L (ref 0–44)
AST: 13 U/L — ABNORMAL LOW (ref 15–41)
Albumin: 3.1 g/dL — ABNORMAL LOW (ref 3.5–5.0)
Alkaline Phosphatase: 119 U/L (ref 38–126)
Anion gap: 5 (ref 5–15)
BUN: 15 mg/dL (ref 6–20)
CO2: 25 mmol/L (ref 22–32)
Calcium: 8.5 mg/dL — ABNORMAL LOW (ref 8.9–10.3)
Chloride: 103 mmol/L (ref 98–111)
Creatinine, Ser: 1.21 mg/dL — ABNORMAL HIGH (ref 0.44–1.00)
GFR, Estimated: 59 mL/min — ABNORMAL LOW (ref >60)
Glucose, Bld: 124 mg/dL — ABNORMAL HIGH (ref 70–99)
Potassium: 4.1 mmol/L (ref 3.5–5.1)
Sodium: 133 mmol/L — ABNORMAL LOW (ref 135–145)
Total Bilirubin: 0.4 mg/dL (ref 0.3–1.2)
Total Protein: 7.1 g/dL (ref 6.5–8.1)

## 2021-06-14 LAB — FERRITIN: Ferritin: 203 ng/mL (ref 11–307)

## 2021-06-14 LAB — IRON AND TIBC
Iron: 47 ug/dL (ref 28–170)
Saturation Ratios: 17 % (ref 10.4–31.8)
TIBC: 282 ug/dL (ref 250–450)
UIBC: 235 ug/dL

## 2021-06-14 LAB — VITAMIN B12: Vitamin B-12: 333 pg/mL (ref 180–914)

## 2021-06-14 LAB — LACTATE DEHYDROGENASE: LDH: 115 U/L (ref 98–192)

## 2021-06-15 ENCOUNTER — Inpatient Hospital Stay (HOSPITAL_COMMUNITY): Payer: Medicaid Other | Admitting: Oncology

## 2021-06-15 ENCOUNTER — Ambulatory Visit (HOSPITAL_COMMUNITY): Payer: Medicaid Other | Admitting: Hematology

## 2021-06-15 ENCOUNTER — Ambulatory Visit (HOSPITAL_COMMUNITY): Payer: Medicaid Other | Admitting: Oncology

## 2021-06-15 LAB — PROTEIN ELECTROPHORESIS, SERUM
A/G Ratio: 0.9 (ref 0.7–1.7)
Albumin ELP: 3.1 g/dL (ref 2.9–4.4)
Alpha-1-Globulin: 0.2 g/dL (ref 0.0–0.4)
Alpha-2-Globulin: 0.8 g/dL (ref 0.4–1.0)
Beta Globulin: 1.1 g/dL (ref 0.7–1.3)
Gamma Globulin: 1.3 g/dL (ref 0.4–1.8)
Globulin, Total: 3.5 g/dL (ref 2.2–3.9)
Total Protein ELP: 6.6 g/dL (ref 6.0–8.5)

## 2021-06-15 NOTE — Progress Notes (Deleted)
[facility] [address] [place] [place], [place] [id number]   CLINIC:  Medical Oncology/Hematology  Patient Care Team: Inc, [facility] as PCP - General Rourk, Gerrit Friendsobert M, MD as Consulting Physician (Gastroenterology)  CHIEF COMPLAINTS/PURPOSE OF CONSULTATION:  Evaluation of IDA  HISTORY OF PRESENTING ILLNESS:  Theresa Malone 40 y.o. female is here because of an evaluation of her IDA, at the request of Dr. Ethelda Chickaroline Roberts.  Today she reports feeling okay. She reports recent onset severe fatigue. She reports her last menses lasted 1 month long; and heavy menses 3 out of 5 days of cycle after she had her her tube ligation in 2016. She does not have blood in the stools. She took iron pills during pregnancy which caused stomach upset and constipation. She has never had a blood transfusion or iron infusion. She denies unintentional weight loss. She has hidradenitis which flares up intermittently. She is currently taking doxycycline and takes it chronically.   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 She has never smoked, and she denies a family history of anemia. Her paternal grandfather had colon cancer and paternal grandmother had liver cancer. She reports sulfur causes rashes.   MEDICAL HISTORY:  Past Medical History:  Diagnosis Date   Anxiety    panic attacks   Cellulitis    Depression    Diabetes mellitus    Gout    Hypertension    Obesity    Venous stasis    Venous stasis dermatitis     SURGICAL HISTORY: Past Surgical History:  Procedure Laterality Date   CESAREAN SECTION     C/S x 2   CESAREAN SECTION N/A 01/24/2015   Procedure: CESAREAN SECTION;  Surgeon: Catalina AntiguaPeggy Constant, MD;  Location: [facility];  Service: Obstetrics;  Laterality: N/A;   TUBAL LIGATION      SOCIAL HISTORY: Social History   Socioeconomic History   Marital status: Married    Spouse name: Not on file   Number of children: Not on file   Years of education:  Not on file   Highest education level: Not on file  Occupational History   Not on file  Tobacco Use   Smoking status: Never   Smokeless tobacco: Never  Vaping Use   Vaping Use: Never used  Substance and Sexual Activity   Alcohol use: Not Currently    Comment: wine occ   Drug use: No   Sexual activity: Yes    Birth control/protection: Surgical    Comment: tubal  Other Topics Concern   Not on file  Social History Narrative   Not on file   Social Determinants of Health   Financial Resource Strain: Low Risk    Difficulty of Paying Living Expenses: Not hard at all  Food Insecurity: No Food Insecurity   Worried About Programme researcher, broadcasting/film/videounning Out of Food in the Last Year: Never true   Ran Out of Food in the Last Year: Never true  Transportation Needs: No Transportation Needs   Lack of Transportation (Medical): No   Lack of Transportation (Non-Medical): No  Physical Activity: Insufficiently Active   Days of Exercise per Week: 2 days   Minutes of Exercise per Session: 20 min  Stress: No Stress Concern Present   Feeling of Stress : Not at all  Social Connections: Socially Integrated   Frequency of Communication with Friends and Family: More than three times a week   Frequency of Social Gatherings with Friends and Family:  More than three times a week   Attends Religious Services: 1 to 4 times per year   Active Member of Clubs or Organizations: No   Attends Engineer, structural: 1 to 4 times per year   Marital Status: Married  Catering manager Violence: Not At Risk   Fear of Current or Ex-Partner: No   Emotionally Abused: No   Physically Abused: No   Sexually Abused: No    FAMILY HISTORY: Family History  Problem Relation Age of Onset   Diabetes Paternal Grandfather    Cancer Paternal Grandfather        liver   Cancer Paternal Grandmother        liver   Hypertension Father    Diabetes Mother    Hypertension Mother    Diabetes Maternal Uncle    Diabetes Maternal Grandmother     Cancer Maternal Grandfather    Diabetes Daughter        boarderline    Asthma Daughter    Bronchitis Daughter    Bronchitis Son    Bronchitis Daughter     ALLERGIES:  is allergic to sulfa antibiotics and lisinopril.  MEDICATIONS:  Current Outpatient Medications  Medication Sig Dispense Refill   acetaminophen (TYLENOL) 325 MG tablet Take 2 tablets (650 mg total) by mouth every 6 (six) hours as needed for mild pain, fever or headache (or Fever >/= 101). 12 tablet 0   benzonatate (TESSALON) 100 MG capsule Take by mouth 3 (three) times daily as needed.     benzonatate (TESSALON) 200 MG capsule Take 1 capsule (200 mg total) by mouth 2 (two) times daily as needed for cough. 30 capsule 0   escitalopram (LEXAPRO) 10 MG tablet Take 10 mg by mouth daily.     gabapentin (NEURONTIN) 300 MG capsule Take 300 mg by mouth 2 (two) times daily.     hydrOXYzine (ATARAX/VISTARIL) 25 MG tablet Take 25 mg by mouth 3 (three) times daily as needed.     ibuprofen (ADVIL) 800 MG tablet Take 1 tablet (800 mg total) by mouth every 8 (eight) hours as needed. 30 tablet 0   insulin glargine (LANTUS SOLOSTAR) 100 UNIT/ML Solostar Pen Inject 56-60 Units into the skin in the morning and at bedtime.     ketorolac (TORADOL) 10 MG tablet Take 1 tablet (10 mg total) by mouth every 6 (six) hours as needed. Prn covid headache 20 tablet 0   liraglutide (VICTOZA) 18 MG/3ML SOPN Inject 1.2 mg into the skin in the morning.      losartan (COZAAR) 50 MG tablet Take 1 tablet (50 mg total) by mouth daily.     metFORMIN (GLUCOPHAGE) 1000 MG tablet Take 1,000 mg by mouth 2 (two) times daily with a meal.     Naproxen Sodium (ALEVE PO) Take by mouth as needed.     traMADol (ULTRAM) 50 MG tablet Take 50 mg by mouth every 6 (six) hours as needed.     traZODone (DESYREL) 100 MG tablet Take 1 tablet (100 mg total) by mouth at bedtime as needed for sleep. 30 tablet 1   triamterene-hydrochlorothiazide (MAXZIDE) 75-50 MG tablet Take 1 tablet  by mouth daily.     No current facility-administered medications for this visit.    REVIEW OF SYSTEMS:   Review of Systems  Constitutional:  Positive for appetite change (75%) and fatigue (depleted). Negative for unexpected weight change.  Gastrointestinal:  Positive for blood in stool and nausea.  Neurological:  Positive for dizziness and numbness (  thumbs).  All other systems reviewed and are negative.   PHYSICAL EXAMINATION: ECOG PERFORMANCE STATUS: 0 - Asymptomatic  There were no vitals filed for this visit.  There were no vitals filed for this visit.  Physical Exam Vitals reviewed.  Constitutional:      Appearance: Normal appearance.  Cardiovascular:     Rate and Rhythm: Normal rate and regular rhythm.     Pulses: Normal pulses.     Heart sounds: Normal heart sounds.  Pulmonary:     Effort: Pulmonary effort is normal.     Breath sounds: Normal breath sounds.  Chest:  Breasts:    Right: No supraclavicular adenopathy.     Left: No supraclavicular adenopathy.  Abdominal:     Palpations: There is no hepatomegaly, splenomegaly or mass.     Tenderness: There is no abdominal tenderness.  Musculoskeletal:     Right lower leg: No edema.     Left lower leg: No edema.  Lymphadenopathy:     Cervical: No cervical adenopathy.     Right cervical: No superficial cervical adenopathy.    Left cervical: No superficial cervical adenopathy.     Upper Body:     Right upper body: No supraclavicular adenopathy.     Left upper body: No supraclavicular adenopathy.  Neurological:     General: No focal deficit present.     Mental Status: She is alert and oriented to person, place, and time.  Psychiatric:        Mood and Affect: Mood normal.        Behavior: Behavior normal.     LABORATORY DATA:  I have reviewed the data as listed Recent Results (from the past 2160 hour(s))  Cytology - PAP( [place])     Status: None   Collection Time: 03/22/21 10:31 AM  Result Value Ref Range    High risk HPV Negative    Neisseria Gonorrhea Negative    Chlamydia Negative    Adequacy      Satisfactory for evaluation; transformation zone component ABSENT.   Diagnosis      - Negative for intraepithelial lesion or malignancy (NILM)   Comment Normal Reference Ranger Chlamydia - Negative    Comment      Normal Reference Range Neisseria Gonorrhea - Negative   Comment Normal Reference Range HPV - Negative   Novel Coronavirus, NAA ([facility])     Status: None   Collection Time: 04/27/21  8:28 AM   Specimen: Nasopharyngeal Swab; Nasopharyngeal(NP) swabs in vial transport medium   Nasopharynge  Result Value Ref Range   SARS-CoV-2, NAA Not Detected Not Detected    Comment: This nucleic acid amplification test was developed and its performance characteristics determined by [employer] [facility]. Nucleic acid amplification tests include RT-PCR and TMA. This test has not been FDA cleared or approved. This test has been authorized by FDA under an Emergency Use Authorization (EUA). This test is only authorized for the duration of time the declaration that circumstances exist justifying the authorization of the emergency use of in vitro diagnostic tests for detection of SARS-CoV-2 virus and/or diagnosis of COVID-19 infection under section 564(b)(1) of the Act, 21 U.S.C. 673ALP-3(X) (1), unless the authorization is terminated or revoked sooner. When diagnostic testing is negative, the possibility of a false negative result should be considered in the context of a patient's recent exposures and the presence of clinical signs and symptoms consistent with COVID-19. An individual without symptoms of COVID-19 and who is not shedding SARS-CoV-2 virus wo uld  expect to have a negative (not detected) result in this assay.   SARS-COV-2, NAA 2 DAY TAT     Status: None   Collection Time: 04/27/21  8:28 AM   Nasopharynge  Result Value Ref Range   SARS-CoV-2, NAA 2 DAY TAT Performed   CBC with  Differential     Status: Abnormal   Collection Time: 06/14/21  8:21 AM  Result Value Ref Range   WBC 9.4 4.0 - 10.5 K/uL   RBC 3.04 (L) 3.87 - 5.11 MIL/uL   Hemoglobin 8.8 (L) 12.0 - 15.0 g/dL   HCT 75.6 (L) 43.3 - 29.5 %   MCV 89.1 80.0 - 100.0 fL   MCH 28.9 26.0 - 34.0 pg   MCHC 32.5 30.0 - 36.0 g/dL   RDW 18.8 (H) 41.6 - 60.6 %   Platelets 325 150 - 400 K/uL   nRBC 0.0 0.0 - 0.2 %   Neutrophils Relative % 70 %   Neutro Abs 6.7 1.7 - 7.7 K/uL   Lymphocytes Relative 15 %   Lymphs Abs 1.4 0.7 - 4.0 K/uL   Monocytes Relative 8 %   Monocytes Absolute 0.7 0.1 - 1.0 K/uL   Eosinophils Relative 5 %   Eosinophils Absolute 0.4 0.0 - 0.5 K/uL   Basophils Relative 0 %   Basophils Absolute 0.0 0.0 - 0.1 K/uL   Immature Granulocytes 2 %   Abs Immature Granulocytes 0.16 (H) 0.00 - 0.07 K/uL    Comment: Performed at [facility], [address] [place], [place] [address]  Lactate dehydrogenase     Status: None   Collection Time: 06/14/21  8:21 AM  Result Value Ref Range   LDH 115 98 - 192 U/L    Comment: Performed at [facility], [address] [place], [place] [address]  Comprehensive metabolic panel     Status: Abnormal   Collection Time: 06/14/21  8:21 AM  Result Value Ref Range   Sodium 133 (L) 135 - 145 mmol/L   Potassium 4.1 3.5 - 5.1 mmol/L   Chloride 103 98 - 111 mmol/L   CO2 25 22 - 32 mmol/L   Glucose, Bld 124 (H) 70 - 99 mg/dL    Comment: Glucose reference range applies only to samples taken after fasting for at least 8 hours.   BUN 15 6 - 20 mg/dL   Creatinine, Ser 3.55 (H) 0.44 - 1.00 mg/dL   Calcium 8.5 (L) 8.9 - 10.3 mg/dL   Total Protein 7.1 6.5 - 8.1 g/dL   Albumin 3.1 (L) 3.5 - 5.0 g/dL   AST 13 (L) 15 - 41 U/L   ALT 19 0 - 44 U/L   Alkaline Phosphatase 119 38 - 126 U/L   Total Bilirubin 0.4 0.3 - 1.2 mg/dL   GFR, Estimated 59 (L) >60 mL/min    Comment: (NOTE) Calculated using the CKD-EPI Creatinine Equation (2021)    Anion gap 5 5 - 15    Comment:  Performed at [facility], [address], [place], [place] [address]  Iron and TIBC     Status: None   Collection Time: 06/14/21  8:22 AM  Result Value Ref Range   Iron 47 28 - 170 ug/dL   TIBC 254 270 - 623 ug/dL   Saturation Ratios 17 10.4 - 31.8 %   UIBC 235 ug/dL    Comment: Performed at [facility], [address] [place], [place] [address]  Ferritin     Status: None   Collection Time:  06/14/21  8:22 AM  Result Value Ref Range   Ferritin 203 11 - 307 ng/mL    Comment: Performed at [facility], [address] [place], [place] [address]  Vitamin B12     Status: None   Collection Time: 06/14/21  8:22 AM  Result Value Ref Range   Vitamin B-12 333 180 - 914 pg/mL    Comment: (NOTE) This assay is not validated for testing neonatal or myeloproliferative syndrome specimens for Vitamin B12 levels. Performed at [facility], [address] [place], [place] [address]   Folate     Status: Abnormal   Collection Time: 06/14/21  8:22 AM  Result Value Ref Range   Folate 4.3 (L) >5.9 ng/mL    Comment: Performed at [facility], [address], [place], [place] [address]  Reticulocytes     Status: Abnormal   Collection Time: 06/14/21  8:22 AM  Result Value Ref Range   Retic Ct Pct 3.3 (H) 0.4 - 3.1 %   RBC. 3.01 (L) 3.87 - 5.11 MIL/uL   Retic Count, Absolute 100.5 19.0 - 186.0 K/uL   Immature Retic Fract 34.2 (H) 2.3 - 15.9 %    Comment: Performed at [facility], [address] [place], [place] [address]    RADIOGRAPHIC STUDIES: I have personally reviewed the radiological images as listed and agreed with the findings in the report. No results found.  ASSESSMENT:  1. Iron deficiency anemia: - Patient seen at the request of Dr. Ethelda Chick for further management of microcytic anemia. - Labs on 03/21/2021 shows hemoglobin 9.1, MCV 78.  Ferritin was 106 and percent saturation was 11.  Platelet count elevated at 483 and mildly elevated white count 12.3 with increased absolute  neutrophil and monocyte counts. - CBC on epic on 09/23/2020 with hemoglobin 7.3 and MCV 82. - She also has mildly elevated creatinine in October. - She has regular heavy menses, 5/28, 3 heavy days of bleeding.  No pica symptoms. - Never had a colonoscopy.  Denies any bleeding per rectum or melena.  No prior history of blood transfusion.  No prior parenteral iron therapy. - Took iron tablet when she was pregnant and had severe constipation and stomach upset from it.  2.  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  Non-smoker. - Paternal grandfather had colon cancer.  Paternal grandmother had liver cancer.  No family history of thalassemia or sickle cell anemia.   PLAN:  1. Iron deficiency anemia: - Reviewed labs from 03/21/2021. - As she was unable to tolerate oral iron therapy in the past, I have recommended parenteral iron therapy. - We will give her Venofer for total of 1 g.  We discussed the side effects in detail. - I plan to repeat her labs in 6 to 8 weeks and follow-up after that.  We will also check for other nutritional deficiencies including B12, folic acid, methylmalonic acid, LDH, reticulocyte count and serum protein electrophoresis.  We plan to repeat ferritin and iron panel and CBC at that time.  2.  Leukocytosis: - He has elevated white count on and off since May 2020. - It is predominantly neutrophils and monocytes. - He also has flareups of hidradenitis for which she is on doxycycline.  This could be causing her leukocytosis.  We will closely monitor.  If there is any significant changes, will consider myeloproliferative disorder work-up.  3.  Thrombocytosis: - This is likely reactive from hidradenitis and anemia.  All questions were answered. The patient knows to call the clinic with any problems, questions or concerns.   Doreatha Massed, MD 06/15/21 8:35 AM  [facility] [phone]   I, Alda Ponder, am  acting as a scribe for Dr. Doreatha Massed.  I, Doreatha Massed MD, have reviewed the above documentation for accuracy and completeness, and I agree with the above.

## 2021-06-17 LAB — METHYLMALONIC ACID, SERUM: Methylmalonic Acid, Quantitative: 126 nmol/L (ref 0–378)

## 2021-06-26 ENCOUNTER — Encounter (HOSPITAL_COMMUNITY): Payer: Self-pay | Admitting: Hematology

## 2021-06-26 ENCOUNTER — Emergency Department (HOSPITAL_COMMUNITY)
Admission: EM | Admit: 2021-06-26 | Discharge: 2021-06-26 | Disposition: A | Discharge status: Home / Self Care | Payer: Medicaid Other | Attending: Emergency Medicine | Admitting: Emergency Medicine

## 2021-06-26 ENCOUNTER — Other Ambulatory Visit: Payer: Self-pay

## 2021-06-26 ENCOUNTER — Emergency Department (HOSPITAL_COMMUNITY): Payer: Medicaid Other

## 2021-06-26 ENCOUNTER — Encounter (HOSPITAL_COMMUNITY): Payer: Self-pay

## 2021-06-26 DIAGNOSIS — K6289 Other specified diseases of anus and rectum: Secondary | ICD-10-CM | POA: Diagnosis not present

## 2021-06-26 DIAGNOSIS — L732 Hidradenitis suppurativa: Secondary | ICD-10-CM | POA: Diagnosis not present

## 2021-06-26 DIAGNOSIS — R197 Diarrhea, unspecified: Secondary | ICD-10-CM | POA: Diagnosis not present

## 2021-06-26 DIAGNOSIS — I10 Essential (primary) hypertension: Secondary | ICD-10-CM | POA: Insufficient documentation

## 2021-06-26 DIAGNOSIS — N9489 Other specified conditions associated with female genital organs and menstrual cycle: Secondary | ICD-10-CM | POA: Insufficient documentation

## 2021-06-26 DIAGNOSIS — Z79899 Other long term (current) drug therapy: Secondary | ICD-10-CM | POA: Insufficient documentation

## 2021-06-26 DIAGNOSIS — E119 Type 2 diabetes mellitus without complications: Secondary | ICD-10-CM | POA: Insufficient documentation

## 2021-06-26 DIAGNOSIS — R739 Hyperglycemia, unspecified: Secondary | ICD-10-CM

## 2021-06-26 DIAGNOSIS — D649 Anemia, unspecified: Secondary | ICD-10-CM

## 2021-06-26 DIAGNOSIS — L03317 Cellulitis of buttock: Secondary | ICD-10-CM

## 2021-06-26 DIAGNOSIS — Z794 Long term (current) use of insulin: Secondary | ICD-10-CM | POA: Diagnosis not present

## 2021-06-26 DIAGNOSIS — Z7984 Long term (current) use of oral hypoglycemic drugs: Secondary | ICD-10-CM | POA: Diagnosis not present

## 2021-06-26 DIAGNOSIS — N83202 Unspecified ovarian cyst, left side: Secondary | ICD-10-CM

## 2021-06-26 DIAGNOSIS — R21 Rash and other nonspecific skin eruption: Secondary | ICD-10-CM | POA: Diagnosis present

## 2021-06-26 LAB — CBC WITH DIFFERENTIAL/PLATELET
Abs Immature Granulocytes: 0.17 10*3/uL — ABNORMAL HIGH (ref 0.00–0.07)
Basophils Absolute: 0 10*3/uL (ref 0.0–0.1)
Basophils Relative: 0 %
Eosinophils Absolute: 0.5 10*3/uL (ref 0.0–0.5)
Eosinophils Relative: 5 %
HCT: 28.7 % — ABNORMAL LOW (ref 36.0–46.0)
Hemoglobin: 9.1 g/dL — ABNORMAL LOW (ref 12.0–15.0)
Immature Granulocytes: 2 %
Lymphocytes Relative: 17 %
Lymphs Abs: 1.7 10*3/uL (ref 0.7–4.0)
MCH: 28.1 pg (ref 26.0–34.0)
MCHC: 31.7 g/dL (ref 30.0–36.0)
MCV: 88.6 fL (ref 80.0–100.0)
Monocytes Absolute: 0.9 10*3/uL (ref 0.1–1.0)
Monocytes Relative: 9 %
Neutro Abs: 6.7 10*3/uL (ref 1.7–7.7)
Neutrophils Relative %: 67 %
Platelets: 306 10*3/uL (ref 150–400)
RBC: 3.24 MIL/uL — ABNORMAL LOW (ref 3.87–5.11)
RDW: 15.8 % — ABNORMAL HIGH (ref 11.5–15.5)
WBC: 10 10*3/uL (ref 4.0–10.5)
nRBC: 0 % (ref 0.0–0.2)

## 2021-06-26 LAB — I-STAT BETA HCG BLOOD, ED (MC, WL, AP ONLY): I-stat hCG, quantitative: <5 m[IU]/mL (ref <5)

## 2021-06-26 LAB — BASIC METABOLIC PANEL
Anion gap: 8 (ref 5–15)
BUN: 18 mg/dL (ref 6–20)
CO2: 25 mmol/L (ref 22–32)
Calcium: 8.9 mg/dL (ref 8.9–10.3)
Chloride: 99 mmol/L (ref 98–111)
Creatinine, Ser: 1.21 mg/dL — ABNORMAL HIGH (ref 0.44–1.00)
GFR, Estimated: 59 mL/min — ABNORMAL LOW (ref >60)
Glucose, Bld: 269 mg/dL — ABNORMAL HIGH (ref 70–99)
Potassium: 4 mmol/L (ref 3.5–5.1)
Sodium: 132 mmol/L — ABNORMAL LOW (ref 135–145)

## 2021-06-26 MED ORDER — DOXYCYCLINE HYCLATE 100 MG PO CAPS: 100.0000 mg | ORAL_CAPSULE | Freq: Two times a day (BID) | ORAL | 0 refills | Status: DC

## 2021-06-26 MED ORDER — TRAMADOL HCL 50 MG PO TABS: 50.0000 mg | ORAL_TABLET | Freq: Four times a day (QID) | ORAL | 0 refills | Status: DC | PRN

## 2021-06-26 MED ORDER — HYDROMORPHONE HCL 1 MG/ML IJ SOLN
1.0000 mg | Freq: Once | INTRAMUSCULAR | Status: AC
  Administered 2021-06-26: 1 mg via INTRAVENOUS
  Filled 2021-06-26: qty 1

## 2021-06-26 MED ORDER — VANCOMYCIN HCL 1500 MG/300ML IV SOLN: 1500.0000 mg | INTRAVENOUS | Status: DC

## 2021-06-26 MED ORDER — AMOXICILLIN-POT CLAVULANATE 875-125 MG PO TABS: 1.0000 | ORAL_TABLET | Freq: Two times a day (BID) | ORAL | 0 refills | Status: DC

## 2021-06-26 MED ORDER — VANCOMYCIN HCL IN DEXTROSE 1-5 GM/200ML-% IV SOLN
1000.0000 mg | INTRAVENOUS | Status: AC
  Administered 2021-06-26 (×2): 1000 mg via INTRAVENOUS
  Filled 2021-06-26 (×2): qty 200

## 2021-06-26 MED ORDER — ONDANSETRON HCL 4 MG/2ML IJ SOLN
4.0000 mg | Freq: Once | INTRAMUSCULAR | Status: AC
  Administered 2021-06-26: 4 mg via INTRAVENOUS
  Filled 2021-06-26: qty 2

## 2021-06-26 MED ORDER — IOHEXOL 300 MG/ML  SOLN
100.0000 mL | Freq: Once | INTRAMUSCULAR | Status: AC | PRN
  Administered 2021-06-26: 100 mL via INTRAVENOUS

## 2021-06-26 MED ORDER — FENTANYL CITRATE (PF) 100 MCG/2ML IJ SOLN
100.0000 ug | Freq: Once | INTRAMUSCULAR | Status: AC
Start: 2021-06-26 — End: 2021-06-26
  Administered 2021-06-26: 100 ug via INTRAVENOUS
  Filled 2021-06-26: qty 2

## 2021-06-26 NOTE — Progress Notes (Signed)
Pharmacy Antibiotic Note  Theresa Malone is a 40 y.o. female admitted on 06/26/2021 with cellulitis.  Pharmacy has been consulted for Vancomycin dosing.  Plan: Vancomycin 2000 mg IV x 1 dose. Vancomycin 1500 mg IV every 24 hours. Monitor labs, c/s, and vanco level as indicated.  Height: 5\' 1"  (154.9 cm) Weight: 105.2 kg (232 lb) IBW/kg (Calculated) : 47.8  Temp (24hrs), Avg:98.4 F (36.9 C), Min:98.4 F (36.9 C), Max:98.4 F (36.9 C)  Recent Labs  Lab 06/26/21 0620  WBC 10.0  CREATININE 1.21*    Estimated Creatinine Clearance: 71.1 mL/min (A) (by C-G formula based on SCr of 1.21 mg/dL (H)).    Allergies  Allergen Reactions   Sulfa Antibiotics Hives   Lisinopril Cough    cough    Antimicrobials this admission: Vanco 8/1 >>   Microbiology results: None pending  Thank you for allowing pharmacy to be a part of this patient's care.  10/1 06/26/2021 10:26 AM

## 2021-06-26 NOTE — Discharge Instructions (Addendum)
It was our pleasure to provide your ER care today - we hope that you feel better.  Keep areas very clean and dry. Warm compresses to sore areas. Wash areas with anti-bacterial soap and water 2x/day. Take antibiotics as prescribed. Take acetaminophen and/or ibuprofen as need for pain. You may also take ultram as need for pain - no driving when taking.   Follow up with primary care doctor in the coming week - discuss possible referral to wound management specialist then.   Your blood sugar is high - drink plenty of water/fluids, continue your meds, follow diabetes meal plan, and follow up with primary care doctor.   Your CT scan also made note of 3.3 cm cystic lesion in the left adnexa, possibly a left ovarian cyst - follow up with primary care doctor and OB/GYN doctor in the next couple weeks.   Return to ER if worse, new symptoms, fevers, increased swelling, spreading redness, severe or intractable pain, or other concern.   You were given pain meds in the ER - no driving for the next 8 hours.

## 2021-06-26 NOTE — ED Provider Notes (Signed)
[facility] EMERGENCY DEPARTMENT Provider Note   CSN: [id number] Arrival date & time: 06/26/21  1950     History Chief Complaint  Patient presents with   Rectal Pain    Theresa Malone is a 40 y.o. female.  The history is provided by the patient.  Rash Location:  Pelvis Pelvic rash location:  R buttock and L buttock Quality: draining and painful   Pain details:    Quality:  Pressure   Severity:  Moderate   Onset quality:  Gradual   Timing:  Constant   Progression:  Worsening Severity:  Moderate Onset quality:  Gradual Duration:  1 week Timing:  Constant Progression:  Worsening Chronicity:  Recurrent Relieved by:  Nothing Exacerbated by: movement and palpation. Associated symptoms: diarrhea   Associated symptoms: no abdominal pain, no fever and not vomiting   Patient with history of diabetes, hypertension, obesity presents with buttock pain.  Patient reports she has irritation and drainage from her buttocks for over a week.  She reports she has had this before and was told that she has hidradenitis.  She reports it typically responds to penicillin injection.  She denies previous surgeries to her rectum or buttocks    Past Medical History:  Diagnosis Date   Anxiety    panic attacks   Cellulitis    Depression    Diabetes mellitus    Gout    Hypertension    Obesity    Venous stasis    Venous stasis dermatitis     Patient Active Problem List   Diagnosis Date Noted   Microcytic anemia 04/17/2021   Encounter for gynecological examination with Papanicolaou smear of cervix 03/22/2021   Dysmenorrhea 12/15/2020   Encounter for IUD removal 12/15/2020   Sepsis without acute organ dysfunction (HCC)    Acute pyelonephritis 09/22/2020   Pyelonephritis 09/22/2020   Emesis, persistent 05/21/2019   Cellulitis    Intractable vomiting with nausea 04/13/2019   Acute respiratory alkalosis    Intractable vomiting    Chest pain    Cellulitis of right lower extremity  04/12/2019   Intractable abdominal pain 04/11/2019   Metabolic alkalosis 04/11/2019   Respiratory alkalosis 04/11/2019   Volume depletion 04/11/2019   Hypokalemia 04/11/2019   Hyponatremia 04/11/2019   Yeast vaginitis 03/13/2018   Type 2 diabetes mellitus, uncontrolled 02/07/2016   Chronic hypertension 02/07/2016   Menorrhagia with regular cycle 02/07/2016   Frequent loose stools 02/07/2016   Status post cesarean section 01/25/2015   Hidradenitis axillaris 09/15/2014   Susceptible to varicella (non-immune), currently pregnant 07/24/2014   Hidradenitis suppurativa 07/20/2014   Morbid obesity with BMI of 45.0-49.9, adult (HCC) 03/03/2014    Past Surgical History:  Procedure Laterality Date   CESAREAN SECTION     C/S x 2   CESAREAN SECTION N/A 01/24/2015   Procedure: CESAREAN SECTION;  Surgeon: Catalina Antigua, MD;  Location: [facility];  Service: Obstetrics;  Laterality: N/A;   TUBAL LIGATION       OB History     Gravida  4   Para  4   Term  2   Preterm  2   AB      Living  4      SAB      IAB      Ectopic      Multiple  0   Live Births  4           Family History  Problem Relation Age of Onset   Diabetes  Paternal Grandfather    Cancer Paternal Grandfather        liver   Cancer Paternal Grandmother        liver   Hypertension Father    Diabetes Mother    Hypertension Mother    Diabetes Maternal Uncle    Diabetes Maternal Grandmother    Cancer Maternal Grandfather    Diabetes Daughter        boarderline    Asthma Daughter    Bronchitis Daughter    Bronchitis Son    Bronchitis Daughter     Social History   Tobacco Use   Smoking status: Never   Smokeless tobacco: Never  Vaping Use   Vaping Use: Never used  Substance Use Topics   Alcohol use: Not Currently    Comment: wine occ   Drug use: No    Home Medications Prior to Admission medications   Medication Sig Start Date End Date Taking? Authorizing Provider  acetaminophen (TYLENOL)  325 MG tablet Take 2 tablets (650 mg total) by mouth every 6 (six) hours as needed for mild pain, fever or headache (or Fever >/= 101). 05/22/19   Emokpae, Courage, MD  escitalopram (LEXAPRO) 10 MG tablet Take 10 mg by mouth daily.    [provider]  gabapentin (NEURONTIN) 300 MG capsule Take 300 mg by mouth 2 (two) times daily.    [provider]  hydrOXYzine (ATARAX/VISTARIL) 25 MG tablet Take 25 mg by mouth 3 (three) times daily as needed.    [provider]  ibuprofen (ADVIL) 800 MG tablet Take 1 tablet (800 mg total) by mouth every 8 (eight) hours as needed. 03/05/21   Zanetto, Shanda Bumps, PA-C  insulin glargine (LANTUS SOLOSTAR) 100 UNIT/ML Solostar Pen Inject 56-60 Units into the skin in the morning and at bedtime.    [provider]  ketorolac (TORADOL) 10 MG tablet Take 1 tablet (10 mg total) by mouth every 6 (six) hours as needed. Prn covid headache 04/27/21   Rodriguez-Southworth, Nettie Elm, PA-C  liraglutide (VICTOZA) 18 MG/3ML SOPN Inject 1.2 mg into the skin in the morning.     [provider]  losartan (COZAAR) 50 MG tablet Take 1 tablet (50 mg total) by mouth daily. 09/26/20   Vassie Loll, MD  metFORMIN (GLUCOPHAGE) 1000 MG tablet Take 1,000 mg by mouth 2 (two) times daily with a meal.    [provider]  Naproxen Sodium (ALEVE PO) Take by mouth as needed.    [provider]  traMADol (ULTRAM) 50 MG tablet Take 50 mg by mouth every 6 (six) hours as needed.    [provider]  traZODone (DESYREL) 100 MG tablet Take 1 tablet (100 mg total) by mouth at bedtime as needed for sleep. 05/22/19   Shon Hale, MD  triamterene-hydrochlorothiazide (MAXZIDE) 75-50 MG tablet Take 1 tablet by mouth daily. 09/26/20   Vassie Loll, MD  clonazepam (KLONOPIN) 0.125 MG disintegrating tablet Take 1 tablet (0.125 mg total) by mouth 2 (two) times daily. Patient not taking: Reported on 05/19/2019 04/15/19 05/21/19  Catarina Hartshorn, MD     Allergies    Sulfa antibiotics and Lisinopril  Review of Systems   Review of Systems  Constitutional:  Negative for fever.  Gastrointestinal:  Positive for diarrhea. Negative for abdominal pain and vomiting.  Genitourinary:  Negative for dysuria.  Skin:  Positive for rash.  All other systems reviewed and are negative.  Physical Exam Updated Vital Signs BP (!) 162/94   Pulse 95  Temp 98.4 F (36.9 C) (Oral)   Resp 18   Ht 1.549 m (5\' 1" )   Wt 105.2 kg   LMP 05/29/2021 (Approximate)   SpO2 100%   BMI 43.84 kg/m   Physical Exam CONSTITUTIONAL: Well developed/well nourished, uncomfortable appearing HEAD: Normocephalic/atraumatic EYES: EOMI ENMT: Mucous membranes moist NECK: supple no meningeal signs SPINE/BACK:entire spine nontender LUNGS:  no apparent distress ABDOMEN: soft, nontender, no rebound or guarding, bowel sounds noted throughout abdomen Rectal-diffuse tenderness noted to the buttocks.  Her right buttocks is indurated with mild erythema and diffuse tenderness.  No crepitus.  Small amount of white drainage is noted.  Patient also has tenderness and induration on the left buttocks, also has perirectal tenderness.  Nurse Maci present for exam NEURO: Pt is awake/alert/appropriate, moves all extremitiesx4.  No facial droop.   EXTREMITIES: pulses normal/equal, full ROM SKIN: warm, color normal PSYCH: Mildly anxious ED Results / Procedures / Treatments   Labs (all labs ordered are listed, but only abnormal results are displayed) Labs Reviewed  BASIC METABOLIC PANEL  CBC WITH DIFFERENTIAL/PLATELET  I-STAT BETA HCG BLOOD, ED ([facility], [facility], [facility] ONLY)    EKG None  Radiology No results found.  Procedures Procedures   Medications Ordered in ED Medications  fentaNYL (SUBLIMAZE) injection 100 mcg (has no administration in time range)  ondansetron (ZOFRAN) injection 4 mg (has no administration in time range)  vancomycin (VANCOCIN) IVPB 1000 mg/200 mL premix (has no  administration in time range)    ED Course  I have reviewed the triage vital signs and the nursing notes.  Pertinent labs  results that were available during my care of the patient were reviewed by me and considered in my medical decision making (see chart for details).    MDM Rules/Calculators/A&P                           6:57 AM Pt with pain in buttocks, also reporting drainage Pt has tenderness/induration to both buttocks, right >left as well rectal tenderness.  Plan for CT pelvis Signed out to Dr. 07/30/2021 Final Clinical Impression(s) / ED Diagnoses Final diagnoses:  Cellulitis of buttock    Rx / DC Orders ED Discharge Orders     None        Denton Lank, MD 06/26/21 [phone]

## 2021-06-26 NOTE — ED Triage Notes (Addendum)
Pt c/o rectal irritation and swelling with drainage; pt reports chronic hidradenitis with intermittent flare-ups

## 2021-06-26 NOTE — ED Provider Notes (Signed)
Pt with hx hidradenitis - signed out by Dr Bebe Shaggy to check CT.   CT neg for abscess.   On exam, multiple areas inflammation/induration related to her hidradenitis including buttocks/crease area, right breast, and left axilla. Scant purulent drainage. No areas of fluctuance/abscess requiring ED I and D.   Given multiple inflamed areas, ?infection, will tx with abx. Pt requests dose of pain meds - given.   Rec pcp f/u - discuss referral to wound management then.   Return precautions provided.      Cathren Laine, MD 06/26/21 1008

## 2021-07-21 ENCOUNTER — Other Ambulatory Visit: Payer: Self-pay

## 2021-07-21 ENCOUNTER — Encounter: Payer: Self-pay | Admitting: Adult Health

## 2021-07-21 ENCOUNTER — Ambulatory Visit (INDEPENDENT_AMBULATORY_CARE_PROVIDER_SITE_OTHER): Payer: Medicaid Other | Admitting: Adult Health

## 2021-07-21 VITALS — BP 162/94 | HR 73 | Ht 61.0 in | Wt 242.0 lb

## 2021-07-21 DIAGNOSIS — N83202 Unspecified ovarian cyst, left side: Secondary | ICD-10-CM | POA: Diagnosis not present

## 2021-07-21 DIAGNOSIS — L732 Hidradenitis suppurativa: Secondary | ICD-10-CM

## 2021-07-21 NOTE — Progress Notes (Addendum)
  Subjective:     Patient ID: [name], female   DOB: 12/30/1980, 40 y.o.   MRN: [id number]  HPI Tiffeny is a 40 year old black female, married, [id number], in for having cyst left ovary. Was found on CT done 06/26/21 when went to ER for Abscess has HS. Lab Results  Component Value Date   DIAGPAP  03/22/2021    - Negative for intraepithelial lesion or malignancy (NILM)   HPVHIGH Negative 03/22/2021   PCP is [facility].  Review of Systems Has pain LLQ at times if sneezes or coughs Has cyst left ovary   Reviewed past medical,surgical, social and family history. Reviewed medications and allergies.  Objective:   Physical Exam BP (!) 162/94 (BP Location: Left Arm, Patient Position: Sitting, Cuff Size: Large)   Pulse 73   Ht 5\' 1"  (1.549 m)   Wt 242 lb (109.8 kg)   LMP 06/27/2021   BMI 45.73 kg/m  Skin warm and dry.Pelvic: external genitalia is normal in appearance, has HS inner thighs, vagina:pink and moist,urethra has no lesions or masses noted, cervix:smooth and bulbous, uterus: normal size, shape and contour, non tender, no masses felt, adnexa: no masses, LLQ tenderness noted. Bladder is non tender and no masses felt.  Depression screen [facility] 2/9 07/21/2021 04/17/2021 03/22/2021  Decreased Interest 1 0 0  Down, Depressed, Hopeless 1 0 0  PHQ - 2 Score 2 0 0  Altered sleeping 1 - 1  Tired, decreased energy 1 - 3  Change in appetite 1 - 0  Feeling bad or failure about yourself  1 - 0  Trouble concentrating 0 - 0  Moving slowly or fidgety/restless 1 - 0  Suicidal thoughts 0 - 0  PHQ-9 Score 7 - 4   On lexapro  and vistaril  GAD 7 : Generalized Anxiety Score 07/21/2021 03/22/2021  Nervous, Anxious, on Edge 1 0  Control/stop worrying 1 1  Worry too much - different things 1 1  Trouble relaxing 1 1  Restless 1 0  Easily annoyed or irritable 1 1  Afraid - awful might happen 2 1  Total GAD 7 Score 8 5      Upstream - 07/21/21 1103       Pregnancy Intention Screening   Does the patient  want to become pregnant in the next year? No    Does the patient's partner want to become pregnant in the next year? No    Would the patient like to discuss contraceptive options today? No      Contraception Wrap Up   Current Method Female Sterilization    End Method Female Sterilization    Contraception Counseling Provided No            Examination chaperoned by 07/23/21.    Assessment:     1. Cyst of left ovary Return in 3 weeks for GYN Engineer, structural to assess ovary  2. Hidradenitis suppurativa Call PCP to see dermatologist or surgeon     Plan:     Return in about 3 weeks for GYN [place]

## 2021-08-04 ENCOUNTER — Other Ambulatory Visit: Payer: Self-pay

## 2021-08-04 ENCOUNTER — Encounter (HOSPITAL_COMMUNITY): Payer: Self-pay | Admitting: Hematology

## 2021-08-04 ENCOUNTER — Emergency Department: Payer: No Typology Code available for payment source

## 2021-08-04 ENCOUNTER — Emergency Department
Admission: EM | Admit: 2021-08-04 | Discharge: 2021-08-04 | Disposition: A | Discharge status: Home / Self Care | Payer: No Typology Code available for payment source | Attending: Emergency Medicine | Admitting: Emergency Medicine

## 2021-08-04 DIAGNOSIS — I10 Essential (primary) hypertension: Secondary | ICD-10-CM | POA: Diagnosis not present

## 2021-08-04 DIAGNOSIS — Z79899 Other long term (current) drug therapy: Secondary | ICD-10-CM | POA: Insufficient documentation

## 2021-08-04 DIAGNOSIS — S161XXA Strain of muscle, fascia and tendon at neck level, initial encounter: Secondary | ICD-10-CM | POA: Insufficient documentation

## 2021-08-04 DIAGNOSIS — R519 Headache, unspecified: Secondary | ICD-10-CM | POA: Insufficient documentation

## 2021-08-04 DIAGNOSIS — S199XXA Unspecified injury of neck, initial encounter: Secondary | ICD-10-CM | POA: Diagnosis present

## 2021-08-04 DIAGNOSIS — Z7984 Long term (current) use of oral hypoglycemic drugs: Secondary | ICD-10-CM | POA: Insufficient documentation

## 2021-08-04 DIAGNOSIS — E119 Type 2 diabetes mellitus without complications: Secondary | ICD-10-CM | POA: Diagnosis not present

## 2021-08-04 DIAGNOSIS — Y92410 Unspecified street and highway as the place of occurrence of the external cause: Secondary | ICD-10-CM | POA: Diagnosis not present

## 2021-08-04 DIAGNOSIS — R42 Dizziness and giddiness: Secondary | ICD-10-CM | POA: Insufficient documentation

## 2021-08-04 DIAGNOSIS — S39012A Strain of muscle, fascia and tendon of lower back, initial encounter: Secondary | ICD-10-CM | POA: Diagnosis not present

## 2021-08-04 MED ORDER — MELOXICAM 15 MG PO TABS: 15.0000 mg | ORAL_TABLET | Freq: Every day | ORAL | 0 refills | Status: DC

## 2021-08-04 MED ORDER — METHOCARBAMOL 500 MG PO TABS: 500.0000 mg | ORAL_TABLET | Freq: Four times a day (QID) | ORAL | 0 refills | Status: DC
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
## 2021-08-11 ENCOUNTER — Other Ambulatory Visit: Payer: Medicaid Other

## 2022-01-19 NOTE — Progress Notes (Signed)
Has canceled or NS all follow up appts and labs

## 2022-03-21 ENCOUNTER — Emergency Department (HOSPITAL_COMMUNITY): Payer: Medicaid Other

## 2022-03-21 ENCOUNTER — Other Ambulatory Visit: Payer: Self-pay

## 2022-03-21 ENCOUNTER — Emergency Department (HOSPITAL_COMMUNITY)
Admission: EM | Admit: 2022-03-21 | Discharge: 2022-03-21 | Disposition: A | Discharge status: Home / Self Care | Payer: Medicaid Other | Attending: Emergency Medicine | Admitting: Emergency Medicine

## 2022-03-21 DIAGNOSIS — Z794 Long term (current) use of insulin: Secondary | ICD-10-CM | POA: Diagnosis not present

## 2022-03-21 DIAGNOSIS — E119 Type 2 diabetes mellitus without complications: Secondary | ICD-10-CM | POA: Insufficient documentation

## 2022-03-21 DIAGNOSIS — L732 Hidradenitis suppurativa: Secondary | ICD-10-CM | POA: Insufficient documentation

## 2022-03-21 DIAGNOSIS — L0231 Cutaneous abscess of buttock: Secondary | ICD-10-CM | POA: Insufficient documentation

## 2022-03-21 DIAGNOSIS — Z7984 Long term (current) use of oral hypoglycemic drugs: Secondary | ICD-10-CM | POA: Insufficient documentation

## 2022-03-21 DIAGNOSIS — N9489 Other specified conditions associated with female genital organs and menstrual cycle: Secondary | ICD-10-CM | POA: Diagnosis not present

## 2022-03-21 LAB — COMPREHENSIVE METABOLIC PANEL
ALT: 15 U/L (ref 0–44)
AST: 11 U/L — ABNORMAL LOW (ref 15–41)
Albumin: 3 g/dL — ABNORMAL LOW (ref 3.5–5.0)
Alkaline Phosphatase: 112 U/L (ref 38–126)
Anion gap: 7 (ref 5–15)
BUN: 22 mg/dL — ABNORMAL HIGH (ref 6–20)
CO2: 25 mmol/L (ref 22–32)
Calcium: 8.9 mg/dL (ref 8.9–10.3)
Chloride: 101 mmol/L (ref 98–111)
Creatinine, Ser: 1.17 mg/dL — ABNORMAL HIGH (ref 0.44–1.00)
GFR, Estimated: >60 mL/min (ref >60)
Glucose, Bld: 248 mg/dL — ABNORMAL HIGH (ref 70–99)
Potassium: 4 mmol/L (ref 3.5–5.1)
Sodium: 133 mmol/L — ABNORMAL LOW (ref 135–145)
Total Bilirubin: 0.2 mg/dL — ABNORMAL LOW (ref 0.3–1.2)
Total Protein: 7.9 g/dL (ref 6.5–8.1)

## 2022-03-21 LAB — CBC WITH DIFFERENTIAL/PLATELET
Abs Immature Granulocytes: 0.15 10*3/uL — ABNORMAL HIGH (ref 0.00–0.07)
Basophils Absolute: 0 10*3/uL (ref 0.0–0.1)
Basophils Relative: 0 %
Eosinophils Absolute: 0.4 10*3/uL (ref 0.0–0.5)
Eosinophils Relative: 4 %
HCT: 25.4 % — ABNORMAL LOW (ref 36.0–46.0)
Hemoglobin: 8 g/dL — ABNORMAL LOW (ref 12.0–15.0)
Immature Granulocytes: 2 %
Lymphocytes Relative: 20 %
Lymphs Abs: 1.9 10*3/uL (ref 0.7–4.0)
MCH: 26.7 pg (ref 26.0–34.0)
MCHC: 31.5 g/dL (ref 30.0–36.0)
MCV: 84.7 fL (ref 80.0–100.0)
Monocytes Absolute: 0.7 10*3/uL (ref 0.1–1.0)
Monocytes Relative: 7 %
Neutro Abs: 6.3 10*3/uL (ref 1.7–7.7)
Neutrophils Relative %: 67 %
Platelets: 365 10*3/uL (ref 150–400)
RBC: 3 MIL/uL — ABNORMAL LOW (ref 3.87–5.11)
RDW: 14.7 % (ref 11.5–15.5)
WBC: 9.5 10*3/uL (ref 4.0–10.5)
nRBC: 0 % (ref 0.0–0.2)

## 2022-03-21 LAB — HCG, SERUM, QUALITATIVE: Preg, Serum: NEGATIVE

## 2022-03-21 MED ORDER — AMOXICILLIN-POT CLAVULANATE 875-125 MG PO TABS: 1.0000 | ORAL_TABLET | Freq: Two times a day (BID) | ORAL | 0 refills | Status: DC

## 2022-03-21 MED ORDER — IOHEXOL 300 MG/ML  SOLN
100.0000 mL | Freq: Once | INTRAMUSCULAR | Status: AC | PRN
  Administered 2022-03-21: 100 mL via INTRAVENOUS

## 2022-03-21 MED ORDER — OXYCODONE-ACETAMINOPHEN 5-325 MG PO TABS: 1.0000 | ORAL_TABLET | Freq: Four times a day (QID) | ORAL | 0 refills | Status: DC | PRN

## 2022-03-21 MED ORDER — MORPHINE SULFATE (PF) 4 MG/ML IV SOLN
4.0000 mg | Freq: Once | INTRAVENOUS | Status: AC
  Administered 2022-03-21: 4 mg via INTRAVENOUS
  Filled 2022-03-21: qty 1

## 2022-03-21 MED ORDER — AMOXICILLIN-POT CLAVULANATE 875-125 MG PO TABS
1.0000 | ORAL_TABLET | Freq: Once | ORAL | Status: AC
  Administered 2022-03-21: 1 via ORAL
  Filled 2022-03-21: qty 1

## 2022-03-21 MED ORDER — FENTANYL CITRATE PF 50 MCG/ML IJ SOSY
50.0000 ug | PREFILLED_SYRINGE | Freq: Once | INTRAMUSCULAR | Status: AC
  Administered 2022-03-21: 50 ug via INTRAVENOUS
  Filled 2022-03-21: qty 1

## 2022-03-21 MED ORDER — ONDANSETRON HCL 4 MG/2ML IJ SOLN
4.0000 mg | Freq: Once | INTRAMUSCULAR | Status: AC
  Administered 2022-03-21: 4 mg via INTRAVENOUS
  Filled 2022-03-21: qty 2

## 2022-03-21 NOTE — ED Triage Notes (Signed)
Reports abscess to buttocks x 1 week.  Resp even and unlabored.  Has not had any prior treatment.  Resp even and unlabored.  Skin warm and dry.  nad ?

## 2022-03-21 NOTE — Discharge Instructions (Addendum)
Your work-up today showed you have an abscess that is draining on its own.  This did not require drainage in the emergency room.  He also did not have any signs or symptoms of systemic infection.  You received your first dose of antibiotic in the emergency room.  I have sent an additional dose of antibiotics and pain medication to the pharmacy for you.  I have attached Dr. Henreitta Leber information above for you.  It is important that you give her a call tomorrow morning to schedule a follow-up appointment. ?

## 2022-03-21 NOTE — ED Provider Notes (Signed)
? ?  Patient signed out to me by Marita Kansas, PA-C pending completion of work-up. ? ? ?Patient here for evaluation of pain to her right buttock with history of hidradenitis suppurativa.  She reports having multiple abscesses in the past to up buttock and gluteal fold.  She noted draining and swelling to the area several days ago. ? ?Laboratory studies were without evidence of leukocytosis.  A CT of the pelvis was performed to further evaluate for any tracking of the abscess. ? ?CT without drainable fluid collection.  Inflammatory changes consistent with hidradenitis. ? ?Discussed importance of sitz bath's and outpatient follow-up with general surgery and PCP.  She will be prescribed antibiotics and short course of pain medication as well.  She appears appropriate for discharge and all questions were answered. ? ?CT PELVIS W CONTRAST ? ?Result Date: 03/21/2022 ?CLINICAL DATA:  Rectal abscess EXAM: CT PELVIS WITH CONTRAST TECHNIQUE: Multidetector CT imaging of the pelvis was performed using the standard protocol following the bolus administration of intravenous contrast. RADIATION DOSE REDUCTION: This exam was performed according to the departmental dose-optimization program which includes automated exposure control, adjustment of the mA and/or kV according to patient size and/or use of iterative reconstruction technique. CONTRAST:  OMNIPAQUE IOHEXOL 300 MG/ML  SOLN COMPARISON:  None. FINDINGS: Urinary Tract:  No abnormality visualized. Bowel: Unremarkable visualized pelvic bowel loops. Appendix normal. Vascular/Lymphatic: The pelvic vasculature is unremarkable. Shotty bilateral inguinal adenopathy is noted, possibly reactive in nature. No frankly pathologic pelvic adenopathy is identified. Reproductive: The pelvic organs are unremarkable. No adnexal masses. Other:  No abdominal wall hernia. Musculoskeletal: No acute bone abnormality. No lytic or blastic bone lesion. There is soft tissue infiltration and dermal  thickening involving the subcutaneous tissues along the right intergluteal cleft best seen on image # 117/5 and 114/33. Punctate subcutaneous gas is seen within the subcutaneous soft tissues of the right gluteal region. No discrete drainable subcutaneous fluid collection is identified. There are multiple areas of skin thickening noted within the gluteal folds and along the intergluteal cleft aside from the area of active inflammation within the subcutaneous soft tissues of the right buttocks. This may represent a chronic inflammatory process such as hidradenitis suppurativa, but is not well assessed on this exam IMPRESSION: Inflammatory changes within the subcutaneous soft tissues of the right buttocks along the intergluteal fold without discrete drainable fluid collection identified. Superimposed dermal thickening along the gluteal folds and intergluteal cleft possibly reflecting a inflammatory process such as hidradenitis suppurativa, though this is not well assessed on this examination. Correlation with clinical examination is recommended. Electronically Signed   By: Helyn Numbers M.D.   On: 03/21/2022 19:55   ? ?  ?Pauline Aus, PA-C ?03/21/22 2145 ? ?  ?Gerhard Munch, MD ?03/22/22 2332 ? ?

## 2022-03-21 NOTE — ED Provider Notes (Addendum)
?[facility] EMERGENCY DEPARTMENT ?Provider Note ? ? ?CSN: [id number] ?Arrival date & time: 03/21/22  1545 ? ?  ? ?History ? ?Chief Complaint  ?Patient presents with  ? Abscess  ? ? ?Theresa Malone is a 41 y.o. female. ? ?41 year old female with past medical history of type 2 diabetes on insulin, hydroadenitis with history of perirectal abscess presents today for evaluation of rectal abscess and drainage of about 1 week duration.  She states she always has lesions on the buttocks because of the hidradenitis however couple times a year this flares up.  Most recently in August 2022.  She did not undergo drainage at that time and was treated with antibiotics.  She states she used to follow with a surgeon prior to COVID pandemic but since then she has not.  She denies fever.  She reports significant pain.  Over the past week she has done sitz bath, topical ointments, creams without improvement in her symptoms.  She has not seen her PCP for this. ? ?The history is provided by the patient. No language interpreter was used.  ? ?  ? ?Home Medications ?Prior to Admission medications   ?Medication Sig Start Date End Date Taking? Authorizing Provider  ?acetaminophen (TYLENOL) 325 MG tablet Take 2 tablets (650 mg total) by mouth every 6 (six) hours as needed for mild pain, fever or headache (or Fever >/= 101). 05/22/19   Emokpae, Courage, MD  ?doxycycline (VIBRAMYCIN) 100 MG capsule Take 1 capsule (100 mg total) by mouth 2 (two) times daily. 06/26/21   Cathren LaineSteinl, Kevin, MD  ?escitalopram (LEXAPRO) 10 MG tablet Take 10 mg by mouth daily.    [provider]  ?gabapentin (NEURONTIN) 300 MG capsule Take 300 mg by mouth 2 (two) times daily.    [provider]  ?hydrOXYzine (ATARAX/VISTARIL) 25 MG tablet Take 25 mg by mouth 3 (three) times daily as needed.    [provider]  ?ibuprofen (ADVIL) 800 MG tablet Take 1 tablet (800 mg total) by mouth every 8 (eight) hours as needed. 03/05/21   Ward, Tylene FantasiaJessica Z, PA-C   ?insulin glargine (LANTUS SOLOSTAR) 100 UNIT/ML Solostar Pen Inject 56-60 Units into the skin in the morning and at bedtime.    [provider]  ?liraglutide (VICTOZA) 18 MG/3ML SOPN Inject 1.2 mg into the skin in the morning.     [provider]  ?losartan (COZAAR) 50 MG tablet Take 1 tablet (50 mg total) by mouth daily. 09/26/20   Vassie LollMadera, Carlos, MD  ?meloxicam (MOBIC) 15 MG tablet Take 1 tablet (15 mg total) by mouth daily. 08/04/21   Cuthriell, Delorise RoyalsJonathan D, PA-C  ?metFORMIN (GLUCOPHAGE) 1000 MG tablet Take 1,000 mg by mouth 2 (two) times daily with a meal.    [provider]  ?methocarbamol (ROBAXIN) 500 MG tablet Take 1 tablet (500 mg total) by mouth 4 (four) times daily. 08/04/21   Cuthriell, Delorise RoyalsJonathan D, PA-C  ?traMADol (ULTRAM) 50 MG tablet Take 1 tablet (50 mg total) by mouth every 6 (six) hours as needed. 06/26/21   Cathren LaineSteinl, Kevin, MD  ?traZODone (DESYREL) 100 MG tablet Take 1 tablet (100 mg total) by mouth at bedtime as needed for sleep. 05/22/19   Shon HaleEmokpae, Courage, MD  ?triamterene-hydrochlorothiazide (MAXZIDE) 75-50 MG tablet Take 1 tablet by mouth daily. 09/26/20   Vassie LollMadera, Carlos, MD  ?clonazepam (KLONOPIN) 0.125 MG disintegrating tablet Take 1 tablet (0.125 mg total) by mouth 2 (two) times daily. ?Patient not taking: Reported on 05/19/2019 04/15/19 05/21/19  Catarina Hartshornat, David,  MD  ?   ? ?Allergies    ?Sulfa antibiotics and Lisinopril   ? ?Review of Systems   ?Review of Systems  ?Constitutional:  Negative for chills and fever.  ?Gastrointestinal:  Negative for abdominal pain, nausea and vomiting.  ?Skin:  Positive for wound.  ?All other systems reviewed and are negative. ? ?Physical Exam ?Updated Vital Signs ?BP (!) 161/111 (BP Location: Right Arm)   Pulse (!) 104   Temp 98.3 ?F (36.8 ?C) (Oral)   Resp 18   Ht 5\' 1"  (1.549 m)   Wt 105.2 kg   SpO2 100%   BMI 43.84 kg/m?  ?Physical Exam ?Vitals and nursing note reviewed.  ?Constitutional:   ?   General: She is not in acute distress. ?    Appearance: Normal appearance. She is not ill-appearing.  ?HENT:  ?   Head: Normocephalic and atraumatic.  ?   Nose: Nose normal.  ?Eyes:  ?   Conjunctiva/sclera: Conjunctivae normal.  ?Pulmonary:  ?   Effort: Pulmonary effort is normal. No respiratory distress.  ?Abdominal:  ?   General: There is no distension.  ?   Palpations: Abdomen is soft.  ?   Tenderness: There is no abdominal tenderness. There is no guarding.  ?Musculoskeletal:     ?   General: No deformity.  ?Skin: ?   Findings: No rash.  ?   Comments: RN present as [place].  Bilateral gluteal clefts with lesions and induration.  Most superior lesion on right gluteal cleft has significant drainage and is open.  This area is exquisitely tender.  ?Neurological:  ?   Mental Status: She is alert.  ? ? ?ED Results / Procedures / Treatments   ?Labs ?(all labs ordered are listed, but only abnormal results are displayed) ?Labs Reviewed  ?CBC WITH DIFFERENTIAL/PLATELET  ?COMPREHENSIVE METABOLIC PANEL  ?I-STAT BETA HCG BLOOD, ED ([facility], [facility], [facility] ONLY)  ? ? ?EKG ?None ? ?Radiology ?No results found. ? ?Procedures ?Procedures  ? ? ?Medications Ordered in ED ?Medications  ?morphine (PF) 4 MG/ML injection 4 mg (has no administration in time range)  ?ondansetron (ZOFRAN) injection 4 mg (has no administration in time range)  ? ? ?ED Course/ Medical Decision Making/ A&P ?Clinical Course as of 03/21/22 1858  ?Wed Mar 21, 2022  ?1804 hCG, serum, qualitative [AA]  ?1824 CBC is without leukocytosis.  Hemoglobin of 8.0.  This appears to be around her baseline.  CMP with creatinine of 1.17 which is also around her baseline.  Glucose elevated at 248.  Sodium of 133 otherwise without acute findings.  Serum pregnancy test negative.  CT pelvis with contrast pending. [AA]  ?  ?Clinical Course User Index ?Mar 23, 2022, PA-C  ? ?                        ?Medical Decision Making ?Amount and/or Complexity of Data Reviewed ?Labs: ordered. Decision-making details documented in ED  Course. ?Radiology: ordered. ? ?Risk ?Prescription drug management. ? ? ?41 year old female with past medical history of suppurative hidradenitis, type 2 diabetes on insulin presents today for evaluation of concern for rectal abscess.  Patient has history of flareups of these abscess on her gluteal cleft.  No evidence of perirectal or anal rectal abscess. On exam patient does have gluteal cleft abscess which is spontaneously draining.  With significant tenderness on palpation.  Will evaluate with CBC, BMP, and CT pelvis to rule out tract, fistula, or significant collection requiring drainage.  Will provide patient dose of Augmentin.  Patient without any signs of sepsis.  Given patient's abscesses spontaneously draining I do not suspect that she will require ED I&D.  Patient does not currently follow with a surgeon.  Patient at the end of my shift is awaiting CT pelvis.  Patient signed out to oncoming provider Tammy PA-C.  I suspect as long as a CT pelvis does not show any concerning findings that patient is appropriate for discharge on p.o. antibiotics and pain medication with follow-up with general surgery.  I discussed this plan with the patient and she voices understanding and is in agreement with plan. ? ? ?Final Clinical Impression(s) / ED Diagnoses ?Final diagnoses:  ?Suppurative hidradenitis  ?Abscess of gluteal cleft  ? ? ?Rx / DC Orders ?ED Discharge Orders   ? ?      Ordered  ?  amoxicillin-clavulanate (AUGMENTIN) 875-125 MG tablet  Every 12 hours       ? 03/21/22 1858  ?  oxyCODONE-acetaminophen (PERCOCET/ROXICET) 5-325 MG tablet  Every 6 hours PRN       ? 03/21/22 1858  ? ?  ?  ? ?  ? ? ?  ?Marita Kansas, PA-C ?03/21/22 1915 ? ?  ?Marita Kansas, PA-C ?03/21/22 1505 ? ?  ?Gerhard Munch, MD ?03/21/22 2125 ? ?

## 2022-05-30 ENCOUNTER — Encounter: Payer: Self-pay | Admitting: Emergency Medicine

## 2022-05-30 ENCOUNTER — Ambulatory Visit
Admission: EM | Admit: 2022-05-30 | Discharge: 2022-05-30 | Disposition: A | Discharge status: Home / Self Care | Payer: Medicaid Other | Attending: Family Medicine | Admitting: Family Medicine

## 2022-05-30 DIAGNOSIS — L03011 Cellulitis of right finger: Secondary | ICD-10-CM

## 2022-05-30 MED ORDER — DOXYCYCLINE HYCLATE 100 MG PO CAPS: 100.0000 mg | ORAL_CAPSULE | Freq: Two times a day (BID) | ORAL | 0 refills | Status: DC

## 2022-05-30 MED ORDER — KETOROLAC TROMETHAMINE 30 MG/ML IJ SOLN
30.0000 mg | Freq: Once | INTRAMUSCULAR | Status: AC
  Administered 2022-05-30: 30 mg via INTRAMUSCULAR

## 2022-05-30 NOTE — ED Provider Notes (Signed)
[facility]    CSN: [id number] Arrival date & time: 05/30/22  1837      History   Chief Complaint No chief complaint on file.   HPI Theresa Malone is a 41 y.o. female.   Presenting today with 4-day history of progressively worsening right middle finger pain, pus pocket to the nail edge after pulling a hangnail.  Denies fever, chills, active drainage, decreased range of motion though is starting to have stiffness down the finger and pain shooting up into wrist.  So far not trying anything over-the-counter for symptoms.    Past Medical History:  Diagnosis Date   Anxiety    panic attacks   Cellulitis    Depression    Diabetes mellitus    Gout    Hidradenitis suppurativa    Hypertension    Obesity    Venous stasis    Venous stasis dermatitis     Patient Active Problem List   Diagnosis Date Noted   Cyst of left ovary 07/21/2021   Microcytic anemia 04/17/2021   Encounter for gynecological examination with Papanicolaou smear of cervix 03/22/2021   Dysmenorrhea 12/15/2020   Encounter for IUD removal 12/15/2020   Sepsis without acute organ dysfunction (HCC)    Acute pyelonephritis 09/22/2020   Pyelonephritis 09/22/2020   Emesis, persistent 05/21/2019   Cellulitis    Intractable vomiting with nausea 04/13/2019   Acute respiratory alkalosis    Intractable vomiting    Chest pain    Cellulitis of right lower extremity 04/12/2019   Intractable abdominal pain 04/11/2019   Metabolic alkalosis 04/11/2019   Respiratory alkalosis 04/11/2019   Volume depletion 04/11/2019   Hypokalemia 04/11/2019   Hyponatremia 04/11/2019   Yeast vaginitis 03/13/2018   Type 2 diabetes mellitus, uncontrolled 02/07/2016   Chronic hypertension 02/07/2016   Menorrhagia with regular cycle 02/07/2016   Frequent loose stools 02/07/2016   Status post cesarean section 01/25/2015   Hidradenitis axillaris 09/15/2014   Susceptible to varicella (non-immune), currently pregnant 07/24/2014    Hidradenitis suppurativa 07/20/2014   Morbid obesity with BMI of 45.0-49.9, adult (HCC) 03/03/2014    Past Surgical History:  Procedure Laterality Date   CESAREAN SECTION     C/S x 2   CESAREAN SECTION N/A 01/24/2015   Procedure: CESAREAN SECTION;  Surgeon: Catalina Antigua, MD;  Location: [facility];  Service: Obstetrics;  Laterality: N/A;   TUBAL LIGATION      OB History     Gravida  4   Para  4   Term  2   Preterm  2   AB      Living  4      SAB      IAB      Ectopic      Multiple  0   Live Births  4            Home Medications    Prior to Admission medications   Medication Sig Start Date End Date Taking? Authorizing Provider  doxycycline (VIBRAMYCIN) 100 MG capsule Take 1 capsule (100 mg total) by mouth 2 (two) times daily. 05/30/22  Yes Particia Nearing, PA-C  amoxicillin-clavulanate (AUGMENTIN) 875-125 MG tablet Take 1 tablet by mouth every 12 (twelve) hours. 03/21/22   Karie Mainland, Amjad, PA-C  escitalopram (LEXAPRO) 10 MG tablet Take 10 mg by mouth daily.    [provider]  gabapentin (NEURONTIN) 300 MG capsule Take 300 mg by mouth 2 (two) times daily.    [provider]  hydrOXYzine (ATARAX/VISTARIL) 25 MG tablet Take 25 mg by mouth 3 (three) times daily as needed.    [provider]  ibuprofen (ADVIL) 800 MG tablet Take 1 tablet (800 mg total) by mouth every 8 (eight) hours as needed. 03/05/21   Ward, Tylene Fantasia, PA-C  insulin glargine (LANTUS SOLOSTAR) 100 UNIT/ML Solostar Pen Inject 56-60 Units into the skin in the morning and at bedtime.    [provider]  liraglutide (VICTOZA) 18 MG/3ML SOPN Inject 1.2 mg into the skin in the morning.     [provider]  losartan (COZAAR) 50 MG tablet Take 1 tablet (50 mg total) by mouth daily. 09/26/20   Vassie Loll, MD  meloxicam (MOBIC) 15 MG tablet Take 1 tablet (15 mg total) by mouth daily. 08/04/21   Cuthriell, Delorise Royals, PA-C  metFORMIN (GLUCOPHAGE) 1000 MG tablet Take  1,000 mg by mouth 2 (two) times daily with a meal.    [provider]  methocarbamol (ROBAXIN) 500 MG tablet Take 1 tablet (500 mg total) by mouth 4 (four) times daily. 08/04/21   Cuthriell, Delorise Royals, PA-C  oxyCODONE-acetaminophen (PERCOCET/ROXICET) 5-325 MG tablet Take 1 tablet by mouth every 6 (six) hours as needed for severe pain. 03/21/22   Marita Kansas, PA-C  traZODone (DESYREL) 100 MG tablet Take 1 tablet (100 mg total) by mouth at bedtime as needed for sleep. 05/22/19   Shon Hale, MD  triamterene-hydrochlorothiazide (MAXZIDE) 75-50 MG tablet Take 1 tablet by mouth daily. 09/26/20   Vassie Loll, MD  clonazepam (KLONOPIN) 0.125 MG disintegrating tablet Take 1 tablet (0.125 mg total) by mouth 2 (two) times daily. Patient not taking: Reported on 05/19/2019 04/15/19 05/21/19  Catarina Hartshorn, MD    Family History Family History  Problem Relation Age of Onset   Diabetes Paternal Grandfather    Cancer Paternal Grandfather        liver   Cancer Paternal Grandmother        liver   Hypertension Father    Diabetes Mother    Hypertension Mother    Diabetes Maternal Uncle    Diabetes Maternal Grandmother    Cancer Maternal Grandfather    Diabetes Daughter        boarderline    Asthma Daughter    Bronchitis Daughter    Bronchitis Son    Bronchitis Daughter     Social History Social History   Tobacco Use   Smoking status: Never   Smokeless tobacco: Never  Vaping Use   Vaping Use: Never used  Substance Use Topics   Alcohol use: Not Currently    Comment: wine occ   Drug use: No     Allergies   Sulfa antibiotics and Lisinopril   Review of Systems Review of Systems Per HPI  Physical Exam Triage Vital Signs ED Triage Vitals  Enc Vitals Group     BP 05/30/22 1847 (!) 156/91     Pulse Rate 05/30/22 1847 98     Resp 05/30/22 1847 18     Temp 05/30/22 1847 98.9 F (37.2 C)     Temp Source 05/30/22 1847 Oral     SpO2 05/30/22 1847 95 %     Weight --      Height  --      Head Circumference --      Peak Flow --      Pain Score 05/30/22 1848 10     Pain Loc --      Pain Edu? --  Excl. in GC? --    No data found.  Updated Vital Signs BP (!) 156/91 (BP Location: Right Arm)   Pulse 98   Temp 98.9 F (37.2 C) (Oral)   Resp 18   LMP 05/24/2022 (Exact Date)   SpO2 95%   Visual Acuity Right Eye Distance:   Left Eye Distance:   Bilateral Distance:    Right Eye Near:   Left Eye Near:    Bilateral Near:     Physical Exam Vitals and nursing note reviewed.  Constitutional:      Appearance: Normal appearance. She is not ill-appearing.  HENT:     Head: Atraumatic.  Eyes:     Extraocular Movements: Extraocular movements intact.     Conjunctiva/sclera: Conjunctivae normal.  Cardiovascular:     Rate and Rhythm: Normal rate and regular rhythm.     Heart sounds: Normal heart sounds.  Pulmonary:     Effort: Pulmonary effort is normal.     Breath sounds: Normal breath sounds.  Musculoskeletal:        General: Swelling and tenderness present. Normal range of motion.     Cervical back: Normal range of motion and neck supple.     Comments: Range of motion intact, distal right middle finger edematous with paronychia present  Skin:    General: Skin is warm and dry.     Comments: Large paronychia present to the lateral nail edge right middle finger with diffuse distal right middle finger swelling.  Neurological:     Mental Status: She is alert and oriented to person, place, and time.     Motor: No weakness.     Gait: Gait normal.     Comments: Right upper extremity neurovascularly intact  Psychiatric:        Mood and Affect: Mood normal.        Thought Content: Thought content normal.        Judgment: Judgment normal.      UC Treatments / Results  Labs (all labs ordered are listed, but only abnormal results are displayed) Labs Reviewed - No data to display  EKG   Radiology No results found.  Procedures Incision and  Drainage  Date/Time: 05/30/2022 7:32 PM  Performed by: Particia Nearing, PA-C Authorized by: Particia Nearing, PA-C   Consent:    Consent obtained:  Verbal   Consent given by:  Patient   Risks, benefits, and alternatives were discussed: yes     Risks discussed:  Bleeding, incomplete drainage, pain and infection   Alternatives discussed:  Alternative treatment Universal protocol:    Procedure explained and questions answered to patient or proxy's satisfaction: yes     Relevant documents present and verified: yes     Patient identity confirmed:  Verbally with patient Location:    Type:  Abscess   Size:  1 cm   Location:  Upper extremity   Upper extremity location:  Finger   Finger location:  R long finger Pre-procedure details:    Skin preparation:  Chlorhexidine with alcohol Sedation:    Sedation type:  None Anesthesia:    Anesthesia method:  Topical application   Topical anesthesia: Cold spray. Procedure type:    Complexity:  Simple Procedure details:    Incision types:  Stab incision   Incision depth:  Dermal   Wound management:  Probed and deloculated   Drainage:  Purulent   Drainage amount:  Moderate   Wound treatment:  Wound left open   Packing materials:  None Post-procedure details:    Procedure completion:  Tolerated well, no immediate complications  (including critical care time)  Medications Ordered in UC Medications  ketorolac (TORADOL) 30 MG/ML injection 30 mg (has no administration in time range)    Initial Impression / Assessment and Plan / UC Course  I have reviewed the triage vital signs and the nursing notes.  Pertinent labs & imaging results that were available during my care of the patient were reviewed by me and considered in my medical decision making (see chart for details).     Paronychia drained without complication, will place on a course of doxycycline, discussed home wound care.  Dressing applied today.  IM Toradol given for  pain per patient request.  Return for any worsening symptoms.  Final Clinical Impressions(s) / UC Diagnoses   Final diagnoses:  Paronychia of finger, right   Discharge Instructions   None    ED Prescriptions     Medication Sig Dispense Auth. Provider   doxycycline (VIBRAMYCIN) 100 MG capsule Take 1 capsule (100 mg total) by mouth 2 (two) times daily. 20 capsule Particia Nearing, [place]      PDMP not reviewed this encounter.   Particia Nearing, [place] 05/30/22 1934

## 2022-05-30 NOTE — ED Triage Notes (Signed)
Pus drainage around right middle finger since Saturday.

## 2022-07-06 ENCOUNTER — Telehealth: Payer: Medicaid Other | Admitting: Physician Assistant

## 2022-07-06 DIAGNOSIS — M79672 Pain in left foot: Secondary | ICD-10-CM

## 2022-07-06 DIAGNOSIS — M79605 Pain in left leg: Secondary | ICD-10-CM

## 2022-07-06 DIAGNOSIS — M79604 Pain in right leg: Secondary | ICD-10-CM | POA: Diagnosis not present

## 2022-07-06 DIAGNOSIS — M79671 Pain in right foot: Secondary | ICD-10-CM | POA: Diagnosis not present

## 2022-07-06 MED ORDER — METHOCARBAMOL 500 MG PO TABS: 500.0000 mg | ORAL_TABLET | Freq: Four times a day (QID) | ORAL | 0 refills | Status: DC

## 2022-07-06 MED ORDER — MELOXICAM 15 MG PO TABS: 15.0000 mg | ORAL_TABLET | Freq: Every day | ORAL | 0 refills | Status: DC

## 2022-07-06 NOTE — Patient Instructions (Signed)
Theresa Malone, thank you for joining Margaretann Loveless, PA-C for today's virtual visit.  While this provider is not your primary care provider (PCP), if your PCP is located in our provider database this encounter information will be shared with them immediately following your visit.  Consent: (Patient) Theresa Coyer Brasil provided verbal consent for this virtual visit at the beginning of the encounter.  Current Medications:  Current Outpatient Medications:    escitalopram (LEXAPRO) 10 MG tablet, Take 10 mg by mouth daily., Disp: , Rfl:    gabapentin (NEURONTIN) 300 MG capsule, Take 300 mg by mouth 2 (two) times daily., Disp: , Rfl:    hydrOXYzine (ATARAX/VISTARIL) 25 MG tablet, Take 25 mg by mouth 3 (three) times daily as needed., Disp: , Rfl:    ibuprofen (ADVIL) 800 MG tablet, Take 1 tablet (800 mg total) by mouth every 8 (eight) hours as needed., Disp: 30 tablet, Rfl: 0   insulin glargine (LANTUS SOLOSTAR) 100 UNIT/ML Solostar Pen, Inject 56-60 Units into the skin in the morning and at bedtime., Disp: , Rfl:    liraglutide (VICTOZA) 18 MG/3ML SOPN, Inject 1.2 mg into the skin in the morning. , Disp: , Rfl:    losartan (COZAAR) 50 MG tablet, Take 1 tablet (50 mg total) by mouth daily., Disp: , Rfl:    meloxicam (MOBIC) 15 MG tablet, Take 1 tablet (15 mg total) by mouth daily., Disp: 30 tablet, Rfl: 0   metFORMIN (GLUCOPHAGE) 1000 MG tablet, Take 1,000 mg by mouth 2 (two) times daily with a meal., Disp: , Rfl:    methocarbamol (ROBAXIN) 500 MG tablet, Take 1 tablet (500 mg total) by mouth 4 (four) times daily., Disp: 30 tablet, Rfl: 0   oxyCODONE-acetaminophen (PERCOCET/ROXICET) 5-325 MG tablet, Take 1 tablet by mouth every 6 (six) hours as needed for severe pain., Disp: 12 tablet, Rfl: 0   traZODone (DESYREL) 100 MG tablet, Take 1 tablet (100 mg total) by mouth at bedtime as needed for sleep., Disp: 30 tablet, Rfl: 1   triamterene-hydrochlorothiazide (MAXZIDE) 75-50 MG tablet, Take 1 tablet by  mouth daily., Disp: , Rfl:    Medications ordered in this encounter:  Meds ordered this encounter  Medications   methocarbamol (ROBAXIN) 500 MG tablet    Sig: Take 1 tablet (500 mg total) by mouth 4 (four) times daily.    Dispense:  30 tablet    Refill:  0    Order Specific Question:   Supervising Provider    Answer:   Hyacinth Meeker, BRIAN [[id number]]   meloxicam (MOBIC) 15 MG tablet    Sig: Take 1 tablet (15 mg total) by mouth daily.    Dispense:  30 tablet    Refill:  0    Order Specific Question:   Supervising Provider    Answer:   Hyacinth Meeker, BRIAN [[id number]]     *If you need refills on other medications prior to your next appointment, please contact your pharmacy*  Follow-Up: Call back or seek an in-person evaluation if the symptoms worsen or if the condition fails to improve as anticipated.  Other Instructions   Foot Pain Many things can cause foot pain. Some common causes are: An injury. A sprain. Arthritis. Blisters. Bunions. Follow these instructions at home: Managing pain, stiffness, and swelling If directed, put ice on the painful area: Put ice in a plastic bag. Place a towel between your skin and the bag. Leave the ice on for 20 minutes, 2-3 times a day.  Activity Do not stand  or walk for long periods. Return to your normal activities as told by your health care provider. Ask your health care provider what activities are safe for you. Do stretches to relieve foot pain and stiffness as told by your health care provider. Do not lift anything that is heavier than 10 lb (4.5 kg), or the limit that you are told, until your health care provider says that it is safe. Lifting a lot of weight can put added pressure on your feet. Lifestyle Wear comfortable, supportive shoes that fit you well. Do not wear high heels. Keep your feet clean and dry. General instructions Take over-the-counter and prescription medicines only as told by your health care provider. Rub your foot gently. Pay  attention to any changes in your symptoms. Keep all follow-up visits as told by your health care provider. This is important. Contact a health care provider if: Your pain does not get better after a few days of self-care. Your pain gets worse. You cannot stand on your foot. Get help right away if: Your foot is numb or tingling. Your foot or toes are swollen. Your foot or toes turn white or blue. You have warmth and redness along your foot. Summary Common causes of foot pain are injury, sprain, arthritis, blisters, or bunions. Ice, medicines, and comfortable shoes may help foot pain. Contact your health care provider if your pain does not get better after a few days of self-care. This information is not intended to replace advice given to you by your health care provider. Make sure you discuss any questions you have with your health care provider. Document Revised: 02/15/2021 Document Reviewed: 02/15/2021 Elsevier Patient Education  2023 Elsevier Inc.    If you have been instructed to have an in-person evaluation today at a local Urgent Care facility, please use the link below. It will take you to a list of all of our available [facility], including address, phone number and hours of operation. Please do not delay care.  [facility]  If you or a family member do not have a primary care provider, use the link below to schedule a visit and establish care. When you choose a [facility] primary care physician or advanced practice provider, you gain a long-term partner in health. Find a Primary Care Provider  Learn more about [facility] in-office and virtual care options: [facility] - Get Care Now

## 2022-07-06 NOTE — Progress Notes (Signed)
Virtual Visit Consent   Theresa Malone, you are scheduled for a virtual visit with a [facility] provider today. Just as with appointments in the office, your consent must be obtained to participate. Your consent will be active for this visit and any virtual visit you may have with one of our providers in the next 365 days. If you have a MyChart account, a copy of this consent can be sent to you electronically.  As this is a virtual visit, video technology does not allow for your provider to perform a traditional examination. This may limit your provider's ability to fully assess your condition. If your provider identifies any concerns that need to be evaluated in person or the need to arrange testing (such as labs, EKG, etc.), we will make arrangements to do so. Although advances in technology are sophisticated, we cannot ensure that it will always work on either your end or our end. If the connection with a video visit is poor, the visit may have to be switched to a telephone visit. With either a video or telephone visit, we are not always able to ensure that we have a secure connection.  By engaging in this virtual visit, you consent to the provision of healthcare and authorize for your insurance to be billed (if applicable) for the services provided during this visit. Depending on your insurance coverage, you may receive a charge related to this service.  I need to obtain your verbal consent now. Are you willing to proceed with your visit today? Theresa Malone has provided verbal consent on 07/06/2022 for a virtual visit (video or telephone). Margaretann Loveless, PA-C  Date: 07/06/2022 7:52 AM  Virtual Visit via Video Note   I, Margaretann Loveless, connected with  Theresa Malone  ([id number], 06/04/1981) on 07/06/22 at  7:45 AM EDT by a video-enabled telemedicine application and verified that I am speaking with the correct person using two identifiers.  Location: Patient: Virtual Visit Location  Patient: Home Provider: Virtual Visit Location Provider: Home Office   I discussed the limitations of evaluation and management by telemedicine and the availability of in person appointments. The patient expressed understanding and agreed to proceed.    History of Present Illness: Theresa Malone is a 41 y.o. who identifies as a female who was assigned female at birth, and is being seen today for pain in feet and legs. Reports she has had this before and has been given meloxicam, methocarbamol, gabapentin, percocet, tramadol. She does still have Gabapentin which she took this morning. She has been taking tylenol, ibuprofen, BC powders without relief. She reports she has had to work all this week and has been standing a lot. She usually would wear TED hose compression socks for swelling, but has not had issues with swelling of recent so she had not been wearing them. Then with being up this week she has had some increased swelling with the pain. She does take Triamterene- HCTZ 75-50mg  daily for swelling. Reports legs hurt so bad she cannot stand up or put weight on them.   Problems:  Patient Active Problem List   Diagnosis Date Noted   Cyst of left ovary 07/21/2021   Microcytic anemia 04/17/2021   Encounter for gynecological examination with Papanicolaou smear of cervix 03/22/2021   Dysmenorrhea 12/15/2020   Encounter for IUD removal 12/15/2020   Sepsis without acute organ dysfunction (HCC)    Acute pyelonephritis 09/22/2020   Pyelonephritis 09/22/2020   Emesis, persistent 05/21/2019  Cellulitis    Intractable vomiting with nausea 04/13/2019   Acute respiratory alkalosis    Intractable vomiting    Chest pain    Cellulitis of right lower extremity 04/12/2019   Intractable abdominal pain 04/11/2019   Metabolic alkalosis 04/11/2019   Respiratory alkalosis 04/11/2019   Volume depletion 04/11/2019   Hypokalemia 04/11/2019   Hyponatremia 04/11/2019   Yeast vaginitis 03/13/2018   Type 2  diabetes mellitus, uncontrolled 02/07/2016   Chronic hypertension 02/07/2016   Menorrhagia with regular cycle 02/07/2016   Frequent loose stools 02/07/2016   Status post cesarean section 01/25/2015   Hidradenitis axillaris 09/15/2014   Susceptible to varicella (non-immune), currently pregnant 07/24/2014   Hidradenitis suppurativa 07/20/2014   Morbid obesity with BMI of 45.0-49.9, adult (HCC) 03/03/2014    Allergies:  Allergies  Allergen Reactions   Sulfa Antibiotics Hives   Lisinopril Cough    cough   Medications:  Current Outpatient Medications:    escitalopram (LEXAPRO) 10 MG tablet, Take 10 mg by mouth daily., Disp: , Rfl:    gabapentin (NEURONTIN) 300 MG capsule, Take 300 mg by mouth 2 (two) times daily., Disp: , Rfl:    hydrOXYzine (ATARAX/VISTARIL) 25 MG tablet, Take 25 mg by mouth 3 (three) times daily as needed., Disp: , Rfl:    ibuprofen (ADVIL) 800 MG tablet, Take 1 tablet (800 mg total) by mouth every 8 (eight) hours as needed., Disp: 30 tablet, Rfl: 0   insulin glargine (LANTUS SOLOSTAR) 100 UNIT/ML Solostar Pen, Inject 56-60 Units into the skin in the morning and at bedtime., Disp: , Rfl:    liraglutide (VICTOZA) 18 MG/3ML SOPN, Inject 1.2 mg into the skin in the morning. , Disp: , Rfl:    losartan (COZAAR) 50 MG tablet, Take 1 tablet (50 mg total) by mouth daily., Disp: , Rfl:    meloxicam (MOBIC) 15 MG tablet, Take 1 tablet (15 mg total) by mouth daily., Disp: 30 tablet, Rfl: 0   metFORMIN (GLUCOPHAGE) 1000 MG tablet, Take 1,000 mg by mouth 2 (two) times daily with a meal., Disp: , Rfl:    methocarbamol (ROBAXIN) 500 MG tablet, Take 1 tablet (500 mg total) by mouth 4 (four) times daily., Disp: 30 tablet, Rfl: 0   oxyCODONE-acetaminophen (PERCOCET/ROXICET) 5-325 MG tablet, Take 1 tablet by mouth every 6 (six) hours as needed for severe pain., Disp: 12 tablet, Rfl: 0   traZODone (DESYREL) 100 MG tablet, Take 1 tablet (100 mg total) by mouth at bedtime as needed for sleep.,  Disp: 30 tablet, Rfl: 1   triamterene-hydrochlorothiazide (MAXZIDE) 75-50 MG tablet, Take 1 tablet by mouth daily., Disp: , Rfl:   Observations/Objective: Patient is well-developed, well-nourished in no acute distress.  Resting comfortably at home.  Head is normocephalic, atraumatic.  No labored breathing.  Speech is clear and coherent with logical content.  Patient is alert and oriented at baseline.    Assessment and Plan: 1. Pain in both feet - methocarbamol (ROBAXIN) 500 MG tablet; Take 1 tablet (500 mg total) by mouth 4 (four) times daily.  Dispense: 30 tablet; Refill: 0 - meloxicam (MOBIC) 15 MG tablet; Take 1 tablet (15 mg total) by mouth daily.  Dispense: 30 tablet; Refill: 0  2. Pain in both lower extremities - methocarbamol (ROBAXIN) 500 MG tablet; Take 1 tablet (500 mg total) by mouth 4 (four) times daily.  Dispense: 30 tablet; Refill: 0 - meloxicam (MOBIC) 15 MG tablet; Take 1 tablet (15 mg total) by mouth daily.  Dispense: 30 tablet; Refill: 0  -  Worsening acute on chronic issue flared from standing all week at work - Advised I could offer Meloxicam (avoid Ibuprofen, BC powder, and naproxen) - Continue tylenol and Gabapentin as prescribed - Add methocarbamol back in  - Wear compression stockings - Elevate legs - Seek in person evaluation if pain is still not well managed  Follow Up Instructions: I discussed the assessment and treatment plan with the patient. The patient was provided an opportunity to ask questions and all were answered. The patient agreed with the plan and demonstrated an understanding of the instructions.  A copy of instructions were sent to the patient via MyChart unless otherwise noted below.    The patient was advised to call back or seek an in-person evaluation if the symptoms worsen or if the condition fails to improve as anticipated.  Time:  I spent 14 minutes with the patient via telehealth technology discussing the above problems/concerns.     Mar Daring, PA-C

## 2022-08-23 ENCOUNTER — Emergency Department: Payer: Medicaid Other

## 2022-08-23 ENCOUNTER — Encounter (HOSPITAL_COMMUNITY): Payer: Self-pay | Admitting: Hematology

## 2022-08-23 ENCOUNTER — Emergency Department
Admission: EM | Admit: 2022-08-23 | Discharge: 2022-08-23 | Disposition: A | Discharge status: Home / Self Care | Payer: Medicaid Other | Attending: Emergency Medicine | Admitting: Emergency Medicine

## 2022-08-23 ENCOUNTER — Other Ambulatory Visit: Payer: Self-pay

## 2022-08-23 DIAGNOSIS — Z20822 Contact with and (suspected) exposure to covid-19: Secondary | ICD-10-CM | POA: Diagnosis not present

## 2022-08-23 DIAGNOSIS — M79661 Pain in right lower leg: Secondary | ICD-10-CM | POA: Insufficient documentation

## 2022-08-23 DIAGNOSIS — M79662 Pain in left lower leg: Secondary | ICD-10-CM | POA: Diagnosis not present

## 2022-08-23 DIAGNOSIS — R739 Hyperglycemia, unspecified: Secondary | ICD-10-CM

## 2022-08-23 DIAGNOSIS — R531 Weakness: Secondary | ICD-10-CM | POA: Diagnosis present

## 2022-08-23 DIAGNOSIS — L03317 Cellulitis of buttock: Secondary | ICD-10-CM | POA: Diagnosis not present

## 2022-08-23 DIAGNOSIS — E1165 Type 2 diabetes mellitus with hyperglycemia: Secondary | ICD-10-CM | POA: Insufficient documentation

## 2022-08-23 DIAGNOSIS — M79604 Pain in right leg: Secondary | ICD-10-CM

## 2022-08-23 LAB — BASIC METABOLIC PANEL
Anion gap: 9 (ref 5–15)
BUN: 35 mg/dL — ABNORMAL HIGH (ref 6–20)
CO2: 26 mmol/L (ref 22–32)
Calcium: 8.9 mg/dL (ref 8.9–10.3)
Chloride: 87 mmol/L — ABNORMAL LOW (ref 98–111)
Creatinine, Ser: 1.66 mg/dL — ABNORMAL HIGH (ref 0.44–1.00)
GFR, Estimated: 40 mL/min — ABNORMAL LOW (ref >60)
Glucose, Bld: 583 mg/dL (ref 70–99)
Potassium: 4.5 mmol/L (ref 3.5–5.1)
Sodium: 122 mmol/L — ABNORMAL LOW (ref 135–145)

## 2022-08-23 LAB — TROPONIN I (HIGH SENSITIVITY)
Troponin I (High Sensitivity): 7 ng/L (ref <18)
Troponin I (High Sensitivity): 7 ng/L (ref <18)

## 2022-08-23 LAB — CBC
HCT: 28.9 % — ABNORMAL LOW (ref 36.0–46.0)
Hemoglobin: 9.1 g/dL — ABNORMAL LOW (ref 12.0–15.0)
MCH: 24.8 pg — ABNORMAL LOW (ref 26.0–34.0)
MCHC: 31.5 g/dL (ref 30.0–36.0)
MCV: 78.7 fL — ABNORMAL LOW (ref 80.0–100.0)
Platelets: 454 10*3/uL — ABNORMAL HIGH (ref 150–400)
RBC: 3.67 MIL/uL — ABNORMAL LOW (ref 3.87–5.11)
RDW: 15.2 % (ref 11.5–15.5)
WBC: 11.5 10*3/uL — ABNORMAL HIGH (ref 4.0–10.5)
nRBC: 0 % (ref 0.0–0.2)

## 2022-08-23 LAB — CBG MONITORING, ED: Glucose-Capillary: 454 mg/dL — ABNORMAL HIGH (ref 70–99)

## 2022-08-23 LAB — POC URINE PREG, ED: Preg Test, Ur: NEGATIVE

## 2022-08-23 LAB — RESP PANEL BY RT-PCR (FLU A&B, COVID) ARPGX2
Influenza A by PCR: NEGATIVE
Influenza B by PCR: NEGATIVE
SARS Coronavirus 2 by RT PCR: NEGATIVE

## 2022-08-23 MED ORDER — INSULIN ASPART 100 UNIT/ML IJ SOLN
10.0000 [IU] | Freq: Once | INTRAMUSCULAR | Status: AC
  Administered 2022-08-23: 10 [IU] via INTRAVENOUS
  Filled 2022-08-23: qty 1

## 2022-08-23 MED ORDER — GLIMEPIRIDE 2 MG PO TABS: 2.0000 mg | ORAL_TABLET | ORAL | 11 refills | Status: DC

## 2022-08-23 MED ORDER — SODIUM CHLORIDE 0.9 % IV BOLUS
1000.0000 mL | Freq: Once | INTRAVENOUS | Status: AC
  Administered 2022-08-23: 1000 mL via INTRAVENOUS

## 2022-08-23 MED ORDER — DIPHENHYDRAMINE HCL 25 MG PO CAPS
50.0000 mg | ORAL_CAPSULE | Freq: Once | ORAL | Status: AC
  Administered 2022-08-23: 50 mg via ORAL
  Filled 2022-08-23: qty 2

## 2022-08-23 MED ORDER — DEXTROSE 5 % IV SOLN
1500.0000 mg | Freq: Once | INTRAVENOUS | Status: AC
  Administered 2022-08-23: 1500 mg via INTRAVENOUS
  Filled 2022-08-23: qty 75

## 2022-08-23 MED ORDER — LIVING WELL WITH DIABETES BOOK
Freq: Once | Status: DC
  Filled 2022-08-23: qty 1

## 2022-08-23 MED ORDER — LANTUS SOLOSTAR 100 UNIT/ML ~~LOC~~ SOPN: 56.0000 [IU] | PEN_INJECTOR | Freq: Two times a day (BID) | SUBCUTANEOUS | 2 refills | Status: DC

## 2022-08-23 MED ORDER — HYDROMORPHONE HCL 1 MG/ML IJ SOLN
1.0000 mg | Freq: Once | INTRAMUSCULAR | Status: AC
  Administered 2022-08-23: 1 mg via INTRAVENOUS
  Filled 2022-08-23: qty 1

## 2022-08-23 NOTE — ED Notes (Signed)
MD OK with discharge with HR 118 and continuous c/o chest pain. AVS with prescriptions provided to and discussed with patient. Pt verbalizes understanding of discharge instructions and denies any questions or concerns at this time. Pt has ride home. Pt taken out of department via W/C to family member's private vehicle. No S/S of distress noted.

## 2022-08-23 NOTE — ED Notes (Signed)
Pt C/O itching all over. MD notified.

## 2022-08-23 NOTE — ED Notes (Signed)
Pt transported to Xray via stretcher at this time.  ?

## 2022-08-23 NOTE — Inpatient Diabetes Management (Signed)
Inpatient Diabetes Program Recommendations  AACE/ADA: New Consensus Statement on Inpatient Glycemic Control (2015)  Target Ranges:  Prepandial:   less than 140 mg/dL      Peak postprandial:   less than 180 mg/dL (1-2 hours)      Critically ill patients:  140 - 180 mg/dL   Lab Results  Component Value Date   GLUCAP 454 (H) 08/23/2022   HGBA1C 9.0 (H) 04/13/2019    Diabetes history: DM2 Outpatient Diabetes medications: Lantus 75-80 units bid Current orders for Inpatient glycemic control: None yet  Inpatient Diabetes Program Recommendations:   Patient currently in ED. Spoke with patient regarding diabetes medications. Patient does not take Metformin (states hurt her stomach and diarrhea-reported to her PCP), does not take Victoza, and does not check CBGs @ home. Please consider if admitted: -Lantus 40 units bid -Novolog 5 units tid meal coverage if eating @ least 50% -Novolog 0-15 units tid, 0-5 units hs  Ordered Living Well With Diabetes book. Reviewed need to keep appointments, check CBGs, and A1c q 3 months. Will follow if admitted  Thank you, Nani Gasser. Yaakov Saindon, RN, MSN, CDE  Diabetes Coordinator Inpatient Glycemic Control Team Team Pager [phone] (8am-5pm) 08/23/2022 11:58 AM

## 2022-08-23 NOTE — ED Triage Notes (Signed)
Pt with c/o head stuffiness since this past Friday, and pt states she has had left arm weakness and pain. Pt clarifies that left arm hurts at the elbow and left leg hurts at the knee and ankle, pt able to move left arm and leg, but states that it hurts her joints to move. Pt states she also has some chest pain that started this past Friday. Pt with c/o HA and pressure behind her eyes. No facial drooping noted, pt states limbs feel stiff but is able to move all limbs.

## 2022-08-23 NOTE — ED Provider Notes (Signed)
[facility] Provider Note   Event Date/Time   First MD Initiated Contact with Patient 08/23/22 0730     (approximate) History  Weakness  HPI Theresa Malone is a 41 y.o. female with a stated past medical history of type 2 diabetes and hidradenitis suppurativa who presents with multiple complaints including bilateral lower extremity paresthesias that she rates a 10/10 in severity and states is similar to previous diabetic neuropathy pain.  Patient states that she has been taking gabapentin, methocarbamol, and anti-inflammatories with no relief of her pain.  Patient also complains of polyuria and hyperglycemia after not taking any of her diabetes medicines since March of this year. ROS: Patient currently denies any vision changes, tinnitus, difficulty speaking, facial droop, sore throat, chest pain, shortness of breath, abdominal pain, nausea/vomiting/diarrhea, dysuria, or weakness/numbness/paresthesias in any extremity   Physical Exam  Triage Vital Signs: ED Triage Vitals  Enc Vitals Group     BP 08/23/22 0712 (!) 182/108     Pulse Rate 08/23/22 0712 (!) 117     Resp 08/23/22 0712 20     Temp 08/23/22 0712 98.5 F (36.9 C)     Temp Source 08/23/22 0712 Oral     SpO2 08/23/22 0712 95 %     Weight 08/23/22 0713 252 lb (114.3 kg)     Height 08/23/22 0713 5\' 1"  (1.549 m)     Head Circumference --      Peak Flow --      Pain Score 08/23/22 0713 10     Pain Loc --      Pain Edu? --      Excl. in [place]? --    Most recent vital signs: Vitals:   08/23/22 1214 08/23/22 1322  BP: 128/84 128/80  Pulse: (!) 118 (!) 116  Resp: 14 16  Temp: 98 F (36.7 C) 98.1 F (36.7 C)  SpO2: 99% 98%   General: Awake, oriented x4. CV:  Good peripheral perfusion.  Resp:  Normal effort.  Abd:  No distention.  Other:   ED Results / Procedures / Treatments  Labs (all labs ordered are listed, but only abnormal results are displayed) Labs Reviewed  BASIC METABOLIC PANEL -  Abnormal; Notable for the following components:      Result Value   Sodium 122 (*)    Chloride 87 (*)    Glucose, Bld 583 (*)    BUN 35 (*)    Creatinine, Ser 1.66 (*)    GFR, Estimated 40 (*)    All other components within normal limits  CBC - Abnormal; Notable for the following components:   WBC 11.5 (*)    RBC 3.67 (*)    Hemoglobin 9.1 (*)    HCT 28.9 (*)    MCV 78.7 (*)    MCH 24.8 (*)    Platelets 454 (*)    All other components within normal limits  CBG MONITORING, ED - Abnormal; Notable for the following components:   Glucose-Capillary 454 (*)    All other components within normal limits  RESP PANEL BY RT-PCR (FLU A&B, COVID) ARPGX2  POC URINE PREG, ED  TROPONIN I (HIGH SENSITIVITY)  TROPONIN I (HIGH SENSITIVITY)   EKG ED ECG REPORT I, Naaman Plummer, the attending physician, personally viewed and interpreted this ECG. Date: 08/23/2022 EKG Time: 0712 Rate: 115 Rhythm: Tachycardic sinus rhythm QRS Axis: normal Intervals: normal ST/T Wave abnormalities: normal Narrative Interpretation: no evidence of acute ischemia RADIOLOGY ED MD interpretation: CT  of the head without contrast interpreted by me shows no evidence of acute abnormalities including no intracerebral hemorrhage, obvious masses, or significant edema  2 view chest x-ray interpreted by me shows no evidence of acute abnormalities including no pneumonia, pneumothorax, or widened mediastinum -Agree with radiology assessment Official radiology report(s): CT Head Wo Contrast  Result Date: 08/23/2022 CLINICAL DATA:  Neuro deficit, acute, stroke suspected EXAM: CT HEAD WITHOUT CONTRAST TECHNIQUE: Contiguous axial images were obtained from the base of the skull through the vertex without intravenous contrast. RADIATION DOSE REDUCTION: This exam was performed according to the departmental dose-optimization program which includes automated exposure control, adjustment of the mA and/or kV according to patient size  and/or use of iterative reconstruction technique. COMPARISON:  None Available. FINDINGS: Brain: No evidence of acute infarction, hemorrhage, hydrocephalus, extra-axial collection or mass lesion/mass effect. Vascular: No hyperdense vessel identified. Skull: No acute fracture. Sinuses/Orbits: Left ethmoid and sphenoid paranasal sinus mucosal thickening. No acute orbital findings. Other: No mastoid effusions. IMPRESSION: No evidence of acute intracranial abnormality. Electronically Signed   By: Margaretha Sheffield M.D.   On: 08/23/2022 08:02   DG Chest 2 View  Result Date: 08/23/2022 CLINICAL DATA:  Chest pain. EXAM: CHEST - 2 VIEW COMPARISON:  October 27, 21. FINDINGS: The heart size and mediastinal contours are within normal limits. Both lungs are clear. No visible pleural effusions or pneumothorax. No acute osseous abnormality. IMPRESSION: No active cardiopulmonary disease. Electronically Signed   By: Margaretha Sheffield M.D.   On: 08/23/2022 07:49   PROCEDURES: Critical Care performed: No Procedures MEDICATIONS ORDERED IN ED: Medications  living well with diabetes book MISC (has no administration in time range)  sodium chloride 0.9 % bolus 1,000 mL (0 mLs Intravenous Stopped 08/23/22 1107)  insulin aspart (novoLOG) injection 10 Units (10 Units Intravenous Given 08/23/22 0928)  HYDROmorphone (DILAUDID) injection 1 mg (1 mg Intravenous Given 08/23/22 0928)  dalbavancin (DALVANCE) 1,500 mg in dextrose 5 % 500 mL IVPB (0 mg Intravenous Stopped 08/23/22 1303)  HYDROmorphone (DILAUDID) injection 1 mg (1 mg Intravenous Given 08/23/22 1121)  diphenhydrAMINE (BENADRYL) capsule 50 mg (50 mg Oral Given 08/23/22 1210)   IMPRESSION / MDM / ASSESSMENT AND PLAN / ED COURSE  I reviewed the triage vital signs and the nursing notes.                             The patient is on the cardiac monitor to evaluate for evidence of arrhythmia and/or significant heart rate changes. Patient's presentation is most consistent with  acute presentation with potential threat to life or bodily function. Patients presentation most consistent with hyperglycemic state WITHOUT evidence of DKA. Given Exam, History, and Workup I have low suspicion for an emergent precipitating factor of this hyperglycemic state such as atypical MI, acute abdomen, or other serious bacterial illness. Patient is Type 2 Diabetic with changes in medication regimen/adherence.  Findings: Patient without AGAP or significant ketones in urine to suggest DKA Interventions: IVF bolus  Re-evaluation: Patients serum glucose downtrended significantly with stable electrolytes and no anion gap at this time.  Concerning the patient's constant headache, bilateral lower extremity pain, and palpitations are due to patient's uncontrolled diabetes.  Discussed with patient at length the importance of getting back on her antihyperglycemic medications.  Patient expressed understanding  Disposition: Discharge home with appropriate insulin regimen and prompt PCP follow up instructions.   FINAL CLINICAL IMPRESSION(S) / ED DIAGNOSES   Final diagnoses:  Cellulitis of multiple sites of buttock  Hyperglycemia  Lower extremity pain, bilateral   Rx / DC Orders   ED Discharge Orders          Ordered    Ambulatory referral to Infectious Disease       Comments: Cellulitis patient:  Received dalbavancin on 08/23/2022.   08/23/22 1009    insulin glargine (LANTUS SOLOSTAR) 100 UNIT/ML Solostar Pen  2 times daily        08/23/22 1052    glimepiride (AMARYL) 2 MG tablet  BH-each morning        08/23/22 1052           Note:  This document was prepared using Dragon voice recognition software and may include unintentional dictation errors.   Naaman Plummer, MD 08/23/22 [phone]

## 2022-09-06 ENCOUNTER — Other Ambulatory Visit: Payer: Medicaid Other

## 2022-09-06 ENCOUNTER — Emergency Department: Payer: Medicaid Other

## 2022-09-06 DIAGNOSIS — F419 Anxiety disorder, unspecified: Secondary | ICD-10-CM | POA: Diagnosis present

## 2022-09-06 DIAGNOSIS — Z8249 Family history of ischemic heart disease and other diseases of the circulatory system: Secondary | ICD-10-CM

## 2022-09-06 DIAGNOSIS — D509 Iron deficiency anemia, unspecified: Secondary | ICD-10-CM | POA: Diagnosis present

## 2022-09-06 DIAGNOSIS — E871 Hypo-osmolality and hyponatremia: Secondary | ICD-10-CM | POA: Diagnosis present

## 2022-09-06 DIAGNOSIS — E1122 Type 2 diabetes mellitus with diabetic chronic kidney disease: Secondary | ICD-10-CM | POA: Diagnosis present

## 2022-09-06 DIAGNOSIS — E1165 Type 2 diabetes mellitus with hyperglycemia: Secondary | ICD-10-CM | POA: Diagnosis present

## 2022-09-06 DIAGNOSIS — Z6841 Body Mass Index (BMI) 40.0 and over, adult: Secondary | ICD-10-CM

## 2022-09-06 DIAGNOSIS — K529 Noninfective gastroenteritis and colitis, unspecified: Principal | ICD-10-CM | POA: Diagnosis present

## 2022-09-06 DIAGNOSIS — Z888 Allergy status to other drugs, medicaments and biological substances status: Secondary | ICD-10-CM

## 2022-09-06 DIAGNOSIS — Z794 Long term (current) use of insulin: Secondary | ICD-10-CM

## 2022-09-06 DIAGNOSIS — E876 Hypokalemia: Secondary | ICD-10-CM | POA: Diagnosis present

## 2022-09-06 DIAGNOSIS — M109 Gout, unspecified: Secondary | ICD-10-CM | POA: Diagnosis present

## 2022-09-06 DIAGNOSIS — F32A Depression, unspecified: Secondary | ICD-10-CM | POA: Diagnosis present

## 2022-09-06 DIAGNOSIS — I160 Hypertensive urgency: Secondary | ICD-10-CM | POA: Diagnosis present

## 2022-09-06 DIAGNOSIS — I129 Hypertensive chronic kidney disease with stage 1 through stage 4 chronic kidney disease, or unspecified chronic kidney disease: Secondary | ICD-10-CM | POA: Diagnosis present

## 2022-09-06 DIAGNOSIS — Z833 Family history of diabetes mellitus: Secondary | ICD-10-CM

## 2022-09-06 DIAGNOSIS — Z79899 Other long term (current) drug therapy: Secondary | ICD-10-CM

## 2022-09-06 DIAGNOSIS — Z7984 Long term (current) use of oral hypoglycemic drugs: Secondary | ICD-10-CM

## 2022-09-06 LAB — CBC WITH DIFFERENTIAL/PLATELET
Abs Immature Granulocytes: 0.04 10*3/uL (ref 0.00–0.07)
Basophils Absolute: 0 10*3/uL (ref 0.0–0.1)
Basophils Relative: 0 %
Eosinophils Absolute: 0 10*3/uL (ref 0.0–0.5)
Eosinophils Relative: 0 %
HCT: 32.9 % — ABNORMAL LOW (ref 36.0–46.0)
Hemoglobin: 10.2 g/dL — ABNORMAL LOW (ref 12.0–15.0)
Immature Granulocytes: 0 %
Lymphocytes Relative: 15 %
Lymphs Abs: 1.6 10*3/uL (ref 0.7–4.0)
MCH: 24.6 pg — ABNORMAL LOW (ref 26.0–34.0)
MCHC: 31 g/dL (ref 30.0–36.0)
MCV: 79.5 fL — ABNORMAL LOW (ref 80.0–100.0)
Monocytes Absolute: 0.5 10*3/uL (ref 0.1–1.0)
Monocytes Relative: 5 %
Neutro Abs: 8.3 10*3/uL — ABNORMAL HIGH (ref 1.7–7.7)
Neutrophils Relative %: 80 %
Platelets: 531 10*3/uL — ABNORMAL HIGH (ref 150–400)
RBC: 4.14 MIL/uL (ref 3.87–5.11)
RDW: 15.5 % (ref 11.5–15.5)
WBC: 10.4 10*3/uL (ref 4.0–10.5)
nRBC: 0 % (ref 0.0–0.2)

## 2022-09-06 LAB — COMPREHENSIVE METABOLIC PANEL
ALT: 14 U/L (ref 0–44)
AST: 19 U/L (ref 15–41)
Albumin: 3.7 g/dL (ref 3.5–5.0)
Alkaline Phosphatase: 110 U/L (ref 38–126)
Anion gap: 9 (ref 5–15)
BUN: 30 mg/dL — ABNORMAL HIGH (ref 6–20)
CO2: 24 mmol/L (ref 22–32)
Calcium: 9.9 mg/dL (ref 8.9–10.3)
Chloride: 97 mmol/L — ABNORMAL LOW (ref 98–111)
Creatinine, Ser: 1.32 mg/dL — ABNORMAL HIGH (ref 0.44–1.00)
GFR, Estimated: 53 mL/min — ABNORMAL LOW (ref >60)
Glucose, Bld: 472 mg/dL — ABNORMAL HIGH (ref 70–99)
Potassium: 4.6 mmol/L (ref 3.5–5.1)
Sodium: 130 mmol/L — ABNORMAL LOW (ref 135–145)
Total Bilirubin: 0.8 mg/dL (ref 0.3–1.2)
Total Protein: 9.9 g/dL — ABNORMAL HIGH (ref 6.5–8.1)

## 2022-09-06 LAB — TROPONIN I (HIGH SENSITIVITY): Troponin I (High Sensitivity): 7 ng/L (ref <18)

## 2022-09-06 MED ORDER — PROMETHAZINE HCL 25 MG/ML IJ SOLN
25.0000 mg | Freq: Once | INTRAMUSCULAR | Status: AC
  Administered 2022-09-06: 25 mg via INTRAMUSCULAR
  Filled 2022-09-06: qty 1

## 2022-09-06 NOTE — ED Provider Triage Note (Signed)
Emergency Medicine Provider Triage Evaluation Note  MCKENZIE BOVE , a 41 y.o. female  was evaluated in triage.  Pt complains of diffuse abdominal pain, intractable nausea vomiting.  Symptoms been ongoing x1 day.  No reported fevers, chills.  No bloody emesis.  No diarrhea, constipation..  Review of Systems  Positive: Generalized abdominal pain, nausea vomiting Negative: Fever, chills, URI symptoms  Physical Exam  BP (!) 196/109 (BP Location: Right Arm)   Pulse 80   Temp 98.3 F (36.8 C) (Oral)   Resp 18   Ht 5\' 1"  (1.549 m)   Wt 114.3 kg   LMP 08/16/2022 (Approximate)   SpO2 100%   BMI 47.61 kg/m  Gen:   Awake, no distress   Resp:  Normal effort  MSK:   Moves extremities without difficulty  Other:    Medical Decision Making  Medically screening exam initiated at 10:33 PM.  Appropriate orders placed.  Danton Sewer Alejos was informed that the remainder of the evaluation will be completed by another provider, this initial triage assessment does not replace that evaluation, and the importance of remaining in the ED until their evaluation is complete.  Presents with diffuse abdominal pain, nausea vomiting.  Imaging, labs ordered.  Patient will be given a shot of Phenergan for symptomatic relief.   Darletta Moll, PA-C 09/06/22 2234

## 2022-09-06 NOTE — ED Triage Notes (Signed)
Pt ambulatory to triage with c/o nausea and vomiting since this morning. Pt reports chest pain began this evening, and she thinks it may be from vomiting. States she had [place] food yesterday and vomiting started afterwards.Pt actively vomiting in triage.  Provider in room for eval at this time.

## 2022-09-07 ENCOUNTER — Encounter: Payer: Self-pay | Admitting: Internal Medicine

## 2022-09-07 ENCOUNTER — Inpatient Hospital Stay
Admission: EM | Admit: 2022-09-07 | Discharge: 2022-09-09 | DRG: 392 | Disposition: A | Discharge status: Home / Self Care | Payer: Medicaid Other | Attending: Internal Medicine | Admitting: Internal Medicine

## 2022-09-07 ENCOUNTER — Emergency Department: Payer: Medicaid Other

## 2022-09-07 ENCOUNTER — Other Ambulatory Visit: Payer: Self-pay

## 2022-09-07 DIAGNOSIS — E1165 Type 2 diabetes mellitus with hyperglycemia: Secondary | ICD-10-CM

## 2022-09-07 DIAGNOSIS — R1110 Vomiting, unspecified: Secondary | ICD-10-CM | POA: Diagnosis present

## 2022-09-07 DIAGNOSIS — D509 Iron deficiency anemia, unspecified: Secondary | ICD-10-CM | POA: Diagnosis present

## 2022-09-07 DIAGNOSIS — I10 Essential (primary) hypertension: Secondary | ICD-10-CM | POA: Diagnosis present

## 2022-09-07 DIAGNOSIS — E871 Hypo-osmolality and hyponatremia: Secondary | ICD-10-CM | POA: Diagnosis present

## 2022-09-07 DIAGNOSIS — K529 Noninfective gastroenteritis and colitis, unspecified: Secondary | ICD-10-CM | POA: Diagnosis not present

## 2022-09-07 DIAGNOSIS — I152 Hypertension secondary to endocrine disorders: Secondary | ICD-10-CM | POA: Diagnosis present

## 2022-09-07 DIAGNOSIS — R739 Hyperglycemia, unspecified: Secondary | ICD-10-CM

## 2022-09-07 DIAGNOSIS — F32A Depression, unspecified: Secondary | ICD-10-CM | POA: Diagnosis present

## 2022-09-07 LAB — IRON AND TIBC
Iron: 47 ug/dL (ref 28–170)
Saturation Ratios: 12 % (ref 10.4–31.8)
TIBC: 378 ug/dL (ref 250–450)
UIBC: 331 ug/dL

## 2022-09-07 LAB — URINE DRUG SCREEN, QUALITATIVE (ARMC ONLY)
Amphetamines, Ur Screen: NOT DETECTED
Barbiturates, Ur Screen: NOT DETECTED
Benzodiazepine, Ur Scrn: NOT DETECTED
Cannabinoid 50 Ng, Ur ~~LOC~~: NOT DETECTED
Cocaine Metabolite,Ur ~~LOC~~: NOT DETECTED
MDMA (Ecstasy)Ur Screen: NOT DETECTED
Methadone Scn, Ur: NOT DETECTED
Opiate, Ur Screen: NOT DETECTED
Phencyclidine (PCP) Ur S: NOT DETECTED
Tricyclic, Ur Screen: NOT DETECTED

## 2022-09-07 LAB — URINALYSIS, ROUTINE W REFLEX MICROSCOPIC
Bacteria, UA: NONE SEEN
Bilirubin Urine: NEGATIVE
Glucose, UA: >=500 mg/dL — AB
Ketones, ur: 5 mg/dL — AB
Leukocytes,Ua: NEGATIVE
Nitrite: NEGATIVE
Protein, ur: 100 mg/dL — AB
Specific Gravity, Urine: 1.036 — ABNORMAL HIGH (ref 1.005–1.030)
pH: 5 (ref 5.0–8.0)

## 2022-09-07 LAB — CBG MONITORING, ED
Glucose-Capillary: 479 mg/dL — ABNORMAL HIGH (ref 70–99)
Glucose-Capillary: 415 mg/dL — ABNORMAL HIGH (ref 70–99)
Glucose-Capillary: 439 mg/dL — ABNORMAL HIGH (ref 70–99)
Glucose-Capillary: 399 mg/dL — ABNORMAL HIGH (ref 70–99)

## 2022-09-07 LAB — HCG, QUANTITATIVE, PREGNANCY: hCG, Beta Chain, Quant, S: <1 m[IU]/mL (ref <5)

## 2022-09-07 LAB — GLUCOSE, CAPILLARY
Glucose-Capillary: 372 mg/dL — ABNORMAL HIGH (ref 70–99)
Glucose-Capillary: 353 mg/dL — ABNORMAL HIGH (ref 70–99)

## 2022-09-07 LAB — POC URINE PREG, ED: Preg Test, Ur: NEGATIVE

## 2022-09-07 MED ORDER — SODIUM CHLORIDE 0.9 % IV BOLUS (SEPSIS)
1000.0000 mL | Freq: Once | INTRAVENOUS | Status: AC
  Administered 2022-09-07: 1000 mL via INTRAVENOUS

## 2022-09-07 MED ORDER — ACETAMINOPHEN 650 MG RE SUPP: 650.0000 mg | Freq: Four times a day (QID) | RECTAL | Status: DC | PRN

## 2022-09-07 MED ORDER — ONDANSETRON HCL 4 MG PO TABS: 4.0000 mg | ORAL_TABLET | Freq: Four times a day (QID) | ORAL | Status: DC | PRN

## 2022-09-07 MED ORDER — IOHEXOL 300 MG/ML  SOLN
100.0000 mL | Freq: Once | INTRAMUSCULAR | Status: AC | PRN
  Administered 2022-09-07: 100 mL via INTRAVENOUS

## 2022-09-07 MED ORDER — ENOXAPARIN SODIUM 40 MG/0.4ML IJ SOSY: 40.0000 mg | PREFILLED_SYRINGE | INTRAMUSCULAR | Status: DC

## 2022-09-07 MED ORDER — KETOROLAC TROMETHAMINE 30 MG/ML IJ SOLN
30.0000 mg | Freq: Once | INTRAMUSCULAR | Status: AC
  Administered 2022-09-07: 30 mg via INTRAVENOUS
  Filled 2022-09-07: qty 1

## 2022-09-07 MED ORDER — ONDANSETRON HCL 4 MG/2ML IJ SOLN
4.0000 mg | Freq: Four times a day (QID) | INTRAMUSCULAR | Status: DC | PRN
  Administered 2022-09-07 – 2022-09-08 (×2): 4 mg via INTRAVENOUS
  Filled 2022-09-07 (×3): qty 2

## 2022-09-07 MED ORDER — LORAZEPAM 2 MG/ML IJ SOLN: 1.0000 mg | Freq: Once | INTRAMUSCULAR | Status: AC

## 2022-09-07 MED ORDER — LORAZEPAM 2 MG/ML IJ SOLN
INTRAMUSCULAR | Status: AC
  Administered 2022-09-07: 1 mg via INTRAVENOUS
  Filled 2022-09-07: qty 1

## 2022-09-07 MED ORDER — ESCITALOPRAM OXALATE 10 MG PO TABS
40.0000 mg | ORAL_TABLET | Freq: Every day | ORAL | Status: DC
  Administered 2022-09-07 – 2022-09-09 (×3): 40 mg via ORAL
  Filled 2022-09-07 (×4): qty 4

## 2022-09-07 MED ORDER — SODIUM CHLORIDE 0.9% FLUSH: 3.0000 mL | INTRAVENOUS | Status: DC | PRN

## 2022-09-07 MED ORDER — HALOPERIDOL LACTATE 5 MG/ML IJ SOLN
2.0000 mg | Freq: Once | INTRAMUSCULAR | Status: AC
  Administered 2022-09-07: 2 mg via INTRAVENOUS
  Filled 2022-09-07: qty 1

## 2022-09-07 MED ORDER — SODIUM CHLORIDE 0.9 % IV SOLN
12.5000 mg | Freq: Four times a day (QID) | INTRAVENOUS | Status: AC | PRN
  Administered 2022-09-07: 12.5 mg via INTRAVENOUS
  Filled 2022-09-07: qty 0.5

## 2022-09-07 MED ORDER — ONDANSETRON HCL 4 MG/2ML IJ SOLN
4.0000 mg | Freq: Once | INTRAMUSCULAR | Status: AC
  Administered 2022-09-07: 4 mg via INTRAVENOUS
  Filled 2022-09-07: qty 2

## 2022-09-07 MED ORDER — HYDRALAZINE HCL 20 MG/ML IJ SOLN
10.0000 mg | Freq: Four times a day (QID) | INTRAMUSCULAR | Status: DC | PRN
  Administered 2022-09-07 – 2022-09-09 (×6): 10 mg via INTRAVENOUS
  Filled 2022-09-07 (×7): qty 1

## 2022-09-07 MED ORDER — METOCLOPRAMIDE HCL 5 MG/ML IJ SOLN
10.0000 mg | Freq: Once | INTRAMUSCULAR | Status: AC
  Administered 2022-09-07: 10 mg via INTRAVENOUS
  Filled 2022-09-07: qty 2

## 2022-09-07 MED ORDER — INSULIN GLARGINE-YFGN 100 UNIT/ML ~~LOC~~ SOLN
30.0000 [IU] | Freq: Two times a day (BID) | SUBCUTANEOUS | Status: DC
  Administered 2022-09-07 (×2): 30 [IU] via SUBCUTANEOUS
  Filled 2022-09-07 (×3): qty 0.3

## 2022-09-07 MED ORDER — SODIUM CHLORIDE 0.9 % IV SOLN: 250.0000 mL | INTRAVENOUS | Status: DC | PRN

## 2022-09-07 MED ORDER — MELATONIN 5 MG PO TABS
5.0000 mg | ORAL_TABLET | Freq: Every evening | ORAL | Status: DC | PRN
  Administered 2022-09-07 – 2022-09-08 (×2): 5 mg via ORAL
  Filled 2022-09-07 (×2): qty 1

## 2022-09-07 MED ORDER — INSULIN ASPART 100 UNIT/ML IJ SOLN
5.0000 [IU] | Freq: Once | INTRAMUSCULAR | Status: AC
  Administered 2022-09-07: 5 [IU] via INTRAVENOUS
  Filled 2022-09-07: qty 1

## 2022-09-07 MED ORDER — LABETALOL HCL 5 MG/ML IV SOLN
10.0000 mg | Freq: Once | INTRAVENOUS | Status: AC
  Administered 2022-09-07: 10 mg via INTRAVENOUS
  Filled 2022-09-07: qty 4

## 2022-09-07 MED ORDER — PANTOPRAZOLE SODIUM 40 MG IV SOLR
40.0000 mg | INTRAVENOUS | Status: DC
  Administered 2022-09-07 – 2022-09-09 (×3): 40 mg via INTRAVENOUS
  Filled 2022-09-07 (×3): qty 10

## 2022-09-07 MED ORDER — SODIUM CHLORIDE 0.9 % IV SOLN: INTRAVENOUS | Status: DC

## 2022-09-07 MED ORDER — SODIUM CHLORIDE 0.9% FLUSH
3.0000 mL | Freq: Two times a day (BID) | INTRAVENOUS | Status: DC
  Administered 2022-09-07 – 2022-09-08 (×3): 3 mL via INTRAVENOUS

## 2022-09-07 MED ORDER — METOCLOPRAMIDE HCL 5 MG/ML IJ SOLN
10.0000 mg | Freq: Three times a day (TID) | INTRAMUSCULAR | Status: AC
  Administered 2022-09-07 (×3): 10 mg via INTRAVENOUS
  Filled 2022-09-07 (×3): qty 2

## 2022-09-07 MED ORDER — ORAL CARE MOUTH RINSE: 15.0000 mL | OROMUCOSAL | Status: DC | PRN

## 2022-09-07 MED ORDER — ACETAMINOPHEN 325 MG PO TABS
650.0000 mg | ORAL_TABLET | Freq: Four times a day (QID) | ORAL | Status: DC | PRN
  Administered 2022-09-07 – 2022-09-08 (×3): 650 mg via ORAL
  Filled 2022-09-07 (×3): qty 2

## 2022-09-07 MED ORDER — INFLUENZA VAC SPLIT QUAD 0.5 ML IM SUSY: 0.5000 mL | PREFILLED_SYRINGE | INTRAMUSCULAR | Status: DC

## 2022-09-07 MED ORDER — ENOXAPARIN SODIUM 60 MG/0.6ML IJ SOSY
0.5000 mg/kg | PREFILLED_SYRINGE | INTRAMUSCULAR | Status: DC
  Administered 2022-09-07 – 2022-09-08 (×2): 57.5 mg via SUBCUTANEOUS
  Filled 2022-09-07 (×2): qty 0.6

## 2022-09-07 MED ORDER — INSULIN ASPART 100 UNIT/ML IJ SOLN
0.0000 [IU] | INTRAMUSCULAR | Status: DC
  Administered 2022-09-07 (×2): 20 [IU] via SUBCUTANEOUS
  Administered 2022-09-08: 7 [IU] via SUBCUTANEOUS
  Administered 2022-09-08: 4 [IU] via SUBCUTANEOUS
  Administered 2022-09-08 (×2): 7 [IU] via SUBCUTANEOUS
  Administered 2022-09-08: 11 [IU] via SUBCUTANEOUS
  Administered 2022-09-08: 7 [IU] via SUBCUTANEOUS
  Administered 2022-09-09: 4 [IU] via SUBCUTANEOUS
  Administered 2022-09-09: 7 [IU] via SUBCUTANEOUS
  Administered 2022-09-09: 4 [IU] via SUBCUTANEOUS
  Filled 2022-09-07 (×11): qty 1

## 2022-09-07 NOTE — H&P (Signed)
History and Physical    Patient: Theresa Malone GXQ:[id number] DOB: 1981/04/16 DOA: 09/07/2022 DOS: the patient was seen and examined on 09/07/2022 PCP: Ethelda Chick, MD  Patient coming from: Home  Chief Complaint:  Chief Complaint  Patient presents with   Nausea   HPI: Theresa Malone is a 41 y.o. female with medical history significant for diabetes mellitus on insulin and oral hypoglycemic agents, hypertension, morbid obesity with BMI of 47 who presents to the ER for evaluation of nausea, vomiting and diarrhea that started 1 day prior to her admission.  Patient states that her symptoms started after she ate some leftover [place] food.  She denies having any sick contacts.  She has had chills but denies having any fever.  She also complains of abdominal pain mostly in the periumbilical area which she rates a 6 x 10 in intensity at its worst. She denies having any chest pain, no shortness of breath, no dizziness, no lightheadedness, no palpitations, no diaphoresis, no urinary symptoms, no cough, no fever, no blurred vision no focal deficit. Patient presented to the ER due to refractory symptoms and received several doses of antiemetics without any improvement in her symptoms. Labs show hyperglycemia without evidence of metabolic acidosis. She will be admitted to the hospital for further evaluation.   Review of Systems: As mentioned in the history of present illness. All other systems reviewed and are negative. Past Medical History:  Diagnosis Date   Anxiety    panic attacks   Cellulitis    Depression    Diabetes mellitus    Gout    Hidradenitis suppurativa    Hypertension    Obesity    Venous stasis    Venous stasis dermatitis    Past Surgical History:  Procedure Laterality Date   CESAREAN SECTION     C/S x 2   CESAREAN SECTION N/A 01/24/2015   Procedure: CESAREAN SECTION;  Surgeon: Catalina Antigua, MD;  Location: [facility];  Service: Obstetrics;  Laterality: N/A;   TUBAL  LIGATION     Social History:  reports that she has never smoked. She has never used smokeless tobacco. She reports that she does not currently use alcohol. She reports that she does not use drugs.  Allergies  Allergen Reactions   Sulfa Antibiotics Hives   Lisinopril Cough    cough    Family History  Problem Relation Age of Onset   Diabetes Paternal Grandfather    Cancer Paternal Grandfather        liver   Cancer Paternal Grandmother        liver   Hypertension Father    Diabetes Mother    Hypertension Mother    Diabetes Maternal Uncle    Diabetes Maternal Grandmother    Cancer Maternal Grandfather    Diabetes Daughter        boarderline    Asthma Daughter    Bronchitis Daughter    Bronchitis Son    Bronchitis Daughter     Prior to Admission medications   Medication Sig Start Date End Date Taking? Authorizing Provider  escitalopram (LEXAPRO) 20 MG tablet Take 40 mg by mouth daily. 08/27/22  Yes [provider]  glimepiride (AMARYL) 2 MG tablet Take 1 tablet (2 mg total) by mouth every morning. 08/23/22 08/23/23 Yes Bradler, Clent Jacks, MD  insulin glargine (LANTUS SOLOSTAR) 100 UNIT/ML Solostar Pen Inject 56-60 Units into the skin in the morning and at bedtime. 08/23/22  Yes Merwyn Katos, MD  clonazepam (  KLONOPIN) 0.125 MG disintegrating tablet Take 1 tablet (0.125 mg total) by mouth 2 (two) times daily. Patient not taking: Reported on 05/19/2019 04/15/19 05/21/19  Orson Eva, MD    Physical Exam: Vitals:   09/06/22 2219 09/06/22 2220 09/07/22 0811  BP: (!) 196/109  (!) 208/114  Pulse: 80  92  Resp: 18  (!) 22  Temp: 98.3 F (36.8 C)  97.7 F (36.5 C)  TempSrc: Oral  Oral  SpO2: 100%  100%  Weight:  114.3 kg   Height:  5\' 1"  (1.549 m)    Physical Exam Vitals reviewed.  Constitutional:      Appearance: She is obese. She is ill-appearing.  HENT:     Head: Normocephalic and atraumatic.     Nose: Nose normal.     Mouth/Throat:     Mouth: Mucous membranes are  dry.  Eyes:     Conjunctiva/sclera: Conjunctivae normal.  Cardiovascular:     Rate and Rhythm: Normal rate and regular rhythm.  Pulmonary:     Effort: Pulmonary effort is normal.     Breath sounds: Normal breath sounds.  Abdominal:     General: Abdomen is flat. Bowel sounds are normal.     Palpations: Abdomen is soft.  Musculoskeletal:        General: Normal range of motion.     Cervical back: Normal range of motion and neck supple.  Skin:    General: Skin is warm and dry.  Neurological:     General: No focal deficit present.  Psychiatric:        Mood and Affect: Mood normal.        Behavior: Behavior normal.     Data Reviewed: Data Reviewed: Relevant notes from primary care and specialist visits, past discharge summaries as available in EHR, including Care Everywhere. Prior diagnostic testing as pertinent to current admission diagnoses Updated medications and problem lists for reconciliation ED course, including vitals, labs, imaging, treatment and response to treatment Triage notes, nursing and pharmacy notes and ED provider's notes Notable results as noted in HPI Labs reviewed.  Urine drug screen is negative, sodium 130, potassium 4.6, chloride 97, bicarb 24, glucose 472, BUN 30, creatinine 1.32, calcium 9.9, total protein 9.9, albumin 3.7, AST 19, ALT 14, alkaline phosphatase 110, total bilirubin 0.8, white count 10.4, hemoglobin 10.2, hematocrit 32.9, platelet count 531 CT scan of the abdomen and pelvis shows no acute findings. There are no new results to review at this time.  Assessment and Plan: * Gastroenteritis Rule out viral versus bacterial Send stool PCR Supportive care with IV fluid hydration, antiemetics and IV PPI  Uncontrolled type 2 diabetes mellitus with hyperglycemia, with long-term current use of insulin ([place]) Patient has type 2 diabetes mellitus with significant hyperglycemia Continue long-acting insulin but decrease dose to one half since she is  n.p.o. Place patient on sliding scale coverage Continue aggressive IV fluid hydration  Depression Continue Lexapro  Microcytic anemia Chronic  Hyponatremia Probably chronic and related to SSRI use but may also be related to hypoglycemia. Hydrate patient and repeat sodium levels in a.m.   Essential hypertension Poorly controlled Patient on IV hydralazine while n.p.o.  Morbid obesity with BMI of 45.0-49.9, adult (HCC) BMI is 87.86 Complicates overall prognosis and care Lifestyle modification and exercise has been discussed with patient in detail      Advance Care Planning:   Code Status: Full Code   Consults: None  Family Communication: Greater than 50% of time was spent discussing plan  of care with patient at the bedside.  All questions and concerns have been addressed.  She verbalizes understanding and agrees with the plan.  Severity of Illness: ObservationThe appropriate patient status for this patient is OBSERVATION. Observation status is judged to be reasonable and necessary in order to provide the required intensity of service to ensure the patient's safety. The patient's presenting symptoms, physical exam findings, and initial radiographic and laboratory data in the context of their medical condition is felt to place them at decreased risk for further clinical deterioration. Furthermore, it is anticipated that the patient will be medically stable for discharge from the hospital within 2 midnights of admission.   Author: Lucile Shutters, MD 09/07/2022 9:23 AM  For on call review [email or web address].

## 2022-09-07 NOTE — Progress Notes (Signed)
PHARMACIST - PHYSICIAN COMMUNICATION  CONCERNING:  Enoxaparin (Lovenox) for DVT Prophylaxis   DESCRIPTION: Patient was prescribed enoxaprin 40mg  q24 hours for VTE prophylaxis.   Filed Weights   09/06/22 2220  Weight: 114.3 kg (252 lb)    Body mass index is 47.61 kg/m.  Estimated Creatinine Clearance: 67.9 mL/min (A) (by C-G formula based on SCr of 1.32 mg/dL (H)).   Based on [place] patient is candidate for enoxaparin 0.5mg /kg TBW SQ every 24 hours based on BMI being >30.  RECOMMENDATION: Pharmacy has adjusted enoxaparin dose per [facility] policy.  Patient is now receiving enoxaparin 57.5 mg every 24 hours    Darnelle Bos, PharmD Clinical Pharmacist  09/07/2022 9:18 AM

## 2022-09-07 NOTE — Assessment & Plan Note (Deleted)
Patient has type 2 diabetes mellitus with significant hyperglycemia Continue long-acting insulin but decrease dose to one half since she is n.p.o. Place patient on sliding scale coverage Continue aggressive IV fluid hydration 

## 2022-09-07 NOTE — Assessment & Plan Note (Signed)
Continue Lexapro

## 2022-09-07 NOTE — Assessment & Plan Note (Signed)
Rule out viral versus bacterial Send stool PCR Supportive care with IV fluid hydration, antiemetics and IV PPI

## 2022-09-07 NOTE — ED Notes (Signed)
Informed RN bed assigned 

## 2022-09-07 NOTE — ED Notes (Signed)
Water given to pt at this time.

## 2022-09-07 NOTE — ED Notes (Signed)
Called to room, pt actively vomiting.  Pt given fan for comfort and by request.  Pt asked if she could go home. Advised I had spoken with admitting provider and we would like for her to stay for treatment. Pt agreed tentatively at this time.

## 2022-09-07 NOTE — Assessment & Plan Note (Signed)
Chronic. 

## 2022-09-07 NOTE — Assessment & Plan Note (Signed)
BMI is 71.21 Complicates overall prognosis and care Lifestyle modification and exercise has been discussed with patient in detail

## 2022-09-07 NOTE — ED Provider Notes (Signed)
[facility] Provider Note    Event Date/Time   First MD Initiated Contact with Patient 09/07/22 0206     (approximate)   History   Nausea   HPI  Theresa Malone is a 41 y.o. female with history of diabetes, hypertension, obesity who presents to the emergency department with nausea, vomiting and diarrhea that started today.  Denies any fever but states she has felt hot and cold.  Complains of abdominal pain just when vomiting.  Has had 2 previous C-sections and a bilateral tubal ligation.  No dysuria, hematuria, vaginal bleeding or discharge.   History provided by patient.    Past Medical History:  Diagnosis Date   Anxiety    panic attacks   Cellulitis    Depression    Diabetes mellitus    Gout    Hidradenitis suppurativa    Hypertension    Obesity    Venous stasis    Venous stasis dermatitis     Past Surgical History:  Procedure Laterality Date   CESAREAN SECTION     C/S x 2   CESAREAN SECTION N/A 01/24/2015   Procedure: CESAREAN SECTION;  Surgeon: Catalina Antigua, MD;  Location: [facility];  Service: Obstetrics;  Laterality: N/A;   TUBAL LIGATION      MEDICATIONS:  Prior to Admission medications   Medication Sig Start Date End Date Taking? Authorizing Provider  escitalopram (LEXAPRO) 10 MG tablet Take 10 mg by mouth daily.    [provider]  gabapentin (NEURONTIN) 300 MG capsule Take 300 mg by mouth 2 (two) times daily.    [provider]  glimepiride (AMARYL) 2 MG tablet Take 1 tablet (2 mg total) by mouth every morning. 08/23/22 08/23/23  Merwyn Katos, MD  hydrOXYzine (ATARAX/VISTARIL) 25 MG tablet Take 25 mg by mouth 3 (three) times daily as needed.    [provider]  ibuprofen (ADVIL) 800 MG tablet Take 1 tablet (800 mg total) by mouth every 8 (eight) hours as needed. 03/05/21   Keneisha Heckart, Tylene Fantasia, PA-C  insulin glargine (LANTUS SOLOSTAR) 100 UNIT/ML Solostar Pen Inject 56-60 Units into the skin in the morning and  at bedtime. 08/23/22   Merwyn Katos, MD  liraglutide (VICTOZA) 18 MG/3ML SOPN Inject 1.2 mg into the skin in the morning.     [provider]  losartan (COZAAR) 50 MG tablet Take 1 tablet (50 mg total) by mouth daily. 09/26/20   Vassie Loll, MD  meloxicam (MOBIC) 15 MG tablet Take 1 tablet (15 mg total) by mouth daily. 07/06/22   Margaretann Loveless, PA-C  metFORMIN (GLUCOPHAGE) 1000 MG tablet Take 1,000 mg by mouth 2 (two) times daily with a meal.    [provider]  methocarbamol (ROBAXIN) 500 MG tablet Take 1 tablet (500 mg total) by mouth 4 (four) times daily. 07/06/22   Margaretann Loveless, PA-C  oxyCODONE-acetaminophen (PERCOCET/ROXICET) 5-325 MG tablet Take 1 tablet by mouth every 6 (six) hours as needed for severe pain. 03/21/22   Marita Kansas, PA-C  traZODone (DESYREL) 100 MG tablet Take 1 tablet (100 mg total) by mouth at bedtime as needed for sleep. 05/22/19   Shon Hale, MD  triamterene-hydrochlorothiazide (MAXZIDE) 75-50 MG tablet Take 1 tablet by mouth daily. 09/26/20   Vassie Loll, MD  clonazepam (KLONOPIN) 0.125 MG disintegrating tablet Take 1 tablet (0.125 mg total) by mouth 2 (two) times daily. Patient not taking: Reported on 05/19/2019 04/15/19 05/21/19  Catarina Hartshorn, MD  Physical Exam   Triage Vital Signs: ED Triage Vitals  Enc Vitals Group     BP 09/06/22 2219 (!) 196/109     Pulse Rate 09/06/22 2219 80     Resp 09/06/22 2219 18     Temp 09/06/22 2219 98.3 F (36.8 C)     Temp Source 09/06/22 2219 Oral     SpO2 09/06/22 2219 100 %     Weight 09/06/22 2220 252 lb (114.3 kg)     Height 09/06/22 2220 5\' 1"  (1.549 m)     Head Circumference --      Peak Flow --      Pain Score 09/06/22 2227 6     Pain Loc --      Pain Edu? --      Excl. in [place]? --     Most recent vital signs: Vitals:   09/06/22 2219  BP: (!) 196/109  Pulse: 80  Resp: 18  Temp: 98.3 F (36.8 C)  SpO2: 100%    CONSTITUTIONAL: Alert and oriented and responds  appropriately to questions. Well-appearing; well-nourished HEAD: Normocephalic, atraumatic EYES: Conjunctivae clear, pupils appear equal, sclera nonicteric ENT: normal nose; moist mucous membranes NECK: Supple, normal ROM CARD: RRR; S1 and S2 appreciated; no murmurs, no clicks, no rubs, no gallops RESP: Normal chest excursion without splinting or tachypnea; breath sounds clear and equal bilaterally; no wheezes, no rhonchi, no rales, no hypoxia or respiratory distress, speaking full sentences ABD/GI: Normal bowel sounds; non-distended; soft, non-tender, no rebound, no guarding, no peritoneal signs BACK: The back appears normal EXT: Normal ROM in all joints; no deformity noted, no edema; no cyanosis SKIN: Normal color for age and race; warm; no rash on exposed skin NEURO: Moves all extremities equally, normal speech PSYCH: The patient's mood and manner are appropriate.   ED Results / Procedures / Treatments   LABS: (all labs ordered are listed, but only abnormal results are displayed) Labs Reviewed  CBC WITH DIFFERENTIAL/PLATELET - Abnormal; Notable for the following components:      Result Value   Hemoglobin 10.2 (*)    HCT 32.9 (*)    MCV 79.5 (*)    MCH 24.6 (*)    Platelets 531 (*)    Neutro Abs 8.3 (*)    All other components within normal limits  COMPREHENSIVE METABOLIC PANEL - Abnormal; Notable for the following components:   Sodium 130 (*)    Chloride 97 (*)    Glucose, Bld 472 (*)    BUN 30 (*)    Creatinine, Ser 1.32 (*)    Total Protein 9.9 (*)    GFR, Estimated 53 (*)    All other components within normal limits  URINALYSIS, ROUTINE W REFLEX MICROSCOPIC - Abnormal; Notable for the following components:   Color, Urine YELLOW (*)    APPearance HAZY (*)    Specific Gravity, Urine 1.036 (*)    Glucose, UA >=500 (*)    Hgb urine dipstick SMALL (*)    Ketones, ur 5 (*)    Protein, ur 100 (*)    All other components within normal limits  CBG MONITORING, ED -  Abnormal; Notable for the following components:   Glucose-Capillary 479 (*)    All other components within normal limits  CBG MONITORING, ED - Abnormal; Notable for the following components:   Glucose-Capillary 415 (*)    All other components within normal limits  HCG, QUANTITATIVE, PREGNANCY  URINE DRUG SCREEN, QUALITATIVE ([facility] ONLY)  POC URINE PREG, ED  TROPONIN I (HIGH SENSITIVITY)     EKG:  EKG Interpretation  Date/Time:  Thursday September 06 2022 22:23:34 EDT Ventricular Rate:  77 PR Interval:  144 QRS Duration: 76 QT Interval:  398 QTC Calculation: 450 R Axis:   27 Text Interpretation: Normal sinus rhythm Minimal voltage criteria for LVH, may be normal variant ( R in aVL ) Borderline ECG When compared with ECG of 23-Aug-2022 07:12, Vent. rate has decreased BY  38 BPM Confirmed by Rochele Raring [phone]) on 09/07/2022 4:00:58 AM         RADIOLOGY: My personal review and interpretation of imaging: CT scan shows no acute abnormality.  I have personally reviewed all radiology reports.   CT ABDOMEN PELVIS W CONTRAST  Result Date: 09/07/2022 CLINICAL DATA:  Abdominal pain, nausea/vomiting EXAM: CT ABDOMEN AND PELVIS WITH CONTRAST TECHNIQUE: Multidetector CT imaging of the abdomen and pelvis was performed using the standard protocol following bolus administration of intravenous contrast. RADIATION DOSE REDUCTION: This exam was performed according to the departmental dose-optimization program which includes automated exposure control, adjustment of the mA and/or kV according to patient size and/or use of iterative reconstruction technique. CONTRAST:  OMNIPAQUE IOHEXOL 300 MG/ML  SOLN COMPARISON:  CT pelvis dated 03/21/2022. CT abdomen/pelvis dated 09/21/2020. FINDINGS: Lower chest: Lung bases are clear. Hepatobiliary: Liver is within normal limits. Gallbladder is unremarkable. No intrahepatic or extrahepatic ductal dilatation. Pancreas: Within normal limits. Spleen: Within  normal limits. Adrenals/Urinary Tract: Adrenal glands are within normal limits. Subcentimeter left upper pole renal cyst (series 2/image 40), measuring simple fluid density, benign (Bosniak I). Right kidney is within normal limits. No hydronephrosis. Bladder is within normal limits. Stomach/Bowel: Stomach is within normal limits. No evidence of bowel obstruction. Normal appendix (series 2/image 50). No colonic wall thickening or inflammatory changes. Vascular/Lymphatic: No evidence of abdominal aortic aneurysm. No suspicious abdominopelvic lymphadenopathy. Reproductive: Uterus is within normal limits. Bilateral ovaries are within normal limits. Other: No abdominopelvic ascites. Musculoskeletal: Visualized osseous structures are within normal limits. IMPRESSION: Negative CT abdomen/pelvis. Electronically Signed   By: Charline Bills M.D.   On: 09/07/2022 00:55     PROCEDURES:  Critical Care performed: No     Procedures    IMPRESSION / MDM / ASSESSMENT AND PLAN / ED COURSE  I reviewed the triage vital signs and the nursing notes.    Patient here with nausea, vomiting and diarrhea.  No fevers.  No abdominal pain.  No urinary symptoms.     DIFFERENTIAL DIAGNOSIS (includes but not limited to):   Viral gastroenteritis, colitis, diverticulitis, appendicitis, UTI, kidney stone, pyelonephritis, dehydration, electrolyte derangement   Patient's presentation is most consistent with acute presentation with potential threat to life or bodily function.   PLAN: Patient's labs, CT scan obtained from triage.  No leukocytosis.  Stable and improving anemia compared to previous.  Normal electrolytes other than glucose of 472 but no DKA.  Chronic kidney disease which is stable.  LFTs and troponin unremarkable.  EKG nonischemic.  CT scan reviewed and interpreted by myself and the radiologist and shows no acute abnormality.  Urine pending.  Has received fluids and Phenergan without relief.  Will give Zofran.   Will give IV insulin.   MEDICATIONS GIVEN IN ED: Medications  promethazine (PHENERGAN) 12.5 mg in sodium chloride 0.9 % 50 mL IVPB (has no administration in time range)  insulin aspart (novoLOG) injection 5 Units (has no administration in time range)  0.9 %  sodium chloride infusion (has no administration in time range)  promethazine (PHENERGAN) injection 25 mg (25 mg Intramuscular Given 09/06/22 2249)  iohexol (OMNIPAQUE) 300 MG/ML solution 100 mL (100 mLs Intravenous Contrast Given 09/07/22 0044)  ondansetron (ZOFRAN) injection 4 mg (4 mg Intravenous Given 09/07/22 0233)  sodium chloride 0.9 % bolus 1,000 mL (1,000 mLs Intravenous New Bag/Given 09/07/22 0233)  ketorolac (TORADOL) 30 MG/ML injection 30 mg (30 mg Intravenous Given 09/07/22 0233)  sodium chloride 0.9 % bolus 1,000 mL (1,000 mLs Intravenous New Bag/Given 09/07/22 0310)  insulin aspart (novoLOG) injection 5 Units (5 Units Intravenous Given 09/07/22 0311)  metoCLOPramide (REGLAN) injection 10 mg (10 mg Intravenous Given 09/07/22 0348)  haloperidol lactate (HALDOL) injection 2 mg (2 mg Intravenous Given 09/07/22 0514)     ED COURSE: Urine shows no sign of infection and drug screen negative.  Blood sugar improving slightly with IV fluids and insulin.  Patient still vomiting.  Will give Reglan.   Patient still vomiting after Zofran, Phenergan, Reglan.  Will give Haldol and discussed with hospitalist for admission for intractable vomiting.   CONSULTS:  Consulted and discussed patient's case with hospitalist, Dr. Para March.  I have recommended admission and consulting physician agrees and will place admission orders.  Patient (and family if present) agree with this plan.   I reviewed all nursing notes, vitals, pertinent previous records.  All labs, EKGs, imaging ordered have been independently reviewed and interpreted by myself.    OUTSIDE RECORDS REVIEWED: Reviewed patient's last admission in October 2021.       FINAL  CLINICAL IMPRESSION(S) / ED DIAGNOSES   Final diagnoses:  Intractable vomiting  Hyperglycemia     Rx / DC Orders   ED Discharge Orders     None        Note:  This document was prepared using Dragon voice recognition software and may include unintentional dictation errors.   Pepper Wyndham, Layla Maw, DO 09/07/22 [phone]

## 2022-09-07 NOTE — Assessment & Plan Note (Signed)
Poorly controlled Patient on IV hydralazine while n.p.o.

## 2022-09-07 NOTE — Progress Notes (Signed)
Patients BP elevated, Dr Francine Graven aware, one time dose labetalol placed and given.

## 2022-09-07 NOTE — Plan of Care (Signed)

## 2022-09-07 NOTE — Assessment & Plan Note (Signed)
Patient has type 2 diabetes mellitus with significant hyperglycemia Continue long-acting insulin but decrease dose to one half since she is n.p.o. Place patient on sliding scale coverage Continue aggressive IV fluid hydration

## 2022-09-07 NOTE — Assessment & Plan Note (Signed)
Probably chronic and related to SSRI use but may also be related to hypoglycemia. Hydrate patient and repeat sodium levels in a.m.

## 2022-09-08 ENCOUNTER — Encounter: Payer: Self-pay | Admitting: Internal Medicine

## 2022-09-08 DIAGNOSIS — Z888 Allergy status to other drugs, medicaments and biological substances status: Secondary | ICD-10-CM | POA: Diagnosis not present

## 2022-09-08 DIAGNOSIS — I160 Hypertensive urgency: Secondary | ICD-10-CM | POA: Diagnosis present

## 2022-09-08 DIAGNOSIS — K529 Noninfective gastroenteritis and colitis, unspecified: Secondary | ICD-10-CM | POA: Diagnosis present

## 2022-09-08 DIAGNOSIS — E876 Hypokalemia: Secondary | ICD-10-CM | POA: Diagnosis present

## 2022-09-08 DIAGNOSIS — R1110 Vomiting, unspecified: Secondary | ICD-10-CM | POA: Diagnosis not present

## 2022-09-08 DIAGNOSIS — Z6841 Body Mass Index (BMI) 40.0 and over, adult: Secondary | ICD-10-CM | POA: Diagnosis not present

## 2022-09-08 DIAGNOSIS — Z7984 Long term (current) use of oral hypoglycemic drugs: Secondary | ICD-10-CM | POA: Diagnosis not present

## 2022-09-08 DIAGNOSIS — D509 Iron deficiency anemia, unspecified: Secondary | ICD-10-CM | POA: Diagnosis present

## 2022-09-08 DIAGNOSIS — E1165 Type 2 diabetes mellitus with hyperglycemia: Secondary | ICD-10-CM

## 2022-09-08 DIAGNOSIS — I10 Essential (primary) hypertension: Secondary | ICD-10-CM | POA: Diagnosis not present

## 2022-09-08 DIAGNOSIS — R739 Hyperglycemia, unspecified: Secondary | ICD-10-CM | POA: Diagnosis present

## 2022-09-08 DIAGNOSIS — F419 Anxiety disorder, unspecified: Secondary | ICD-10-CM | POA: Diagnosis present

## 2022-09-08 DIAGNOSIS — F32A Depression, unspecified: Secondary | ICD-10-CM | POA: Diagnosis present

## 2022-09-08 DIAGNOSIS — Z79899 Other long term (current) drug therapy: Secondary | ICD-10-CM | POA: Diagnosis not present

## 2022-09-08 DIAGNOSIS — Z794 Long term (current) use of insulin: Secondary | ICD-10-CM

## 2022-09-08 DIAGNOSIS — Z833 Family history of diabetes mellitus: Secondary | ICD-10-CM | POA: Diagnosis not present

## 2022-09-08 DIAGNOSIS — M109 Gout, unspecified: Secondary | ICD-10-CM | POA: Diagnosis present

## 2022-09-08 DIAGNOSIS — I129 Hypertensive chronic kidney disease with stage 1 through stage 4 chronic kidney disease, or unspecified chronic kidney disease: Secondary | ICD-10-CM | POA: Diagnosis present

## 2022-09-08 DIAGNOSIS — Z8249 Family history of ischemic heart disease and other diseases of the circulatory system: Secondary | ICD-10-CM | POA: Diagnosis not present

## 2022-09-08 DIAGNOSIS — E1122 Type 2 diabetes mellitus with diabetic chronic kidney disease: Secondary | ICD-10-CM | POA: Diagnosis present

## 2022-09-08 DIAGNOSIS — E871 Hypo-osmolality and hyponatremia: Secondary | ICD-10-CM | POA: Diagnosis present

## 2022-09-08 LAB — GLUCOSE, CAPILLARY
Glucose-Capillary: 186 mg/dL — ABNORMAL HIGH (ref 70–99)
Glucose-Capillary: 188 mg/dL — ABNORMAL HIGH (ref 70–99)
Glucose-Capillary: 218 mg/dL — ABNORMAL HIGH (ref 70–99)
Glucose-Capillary: 232 mg/dL — ABNORMAL HIGH (ref 70–99)
Glucose-Capillary: 232 mg/dL — ABNORMAL HIGH (ref 70–99)
Glucose-Capillary: 248 mg/dL — ABNORMAL HIGH (ref 70–99)
Glucose-Capillary: 255 mg/dL — ABNORMAL HIGH (ref 70–99)

## 2022-09-08 LAB — CBC
HCT: 31.8 % — ABNORMAL LOW (ref 36.0–46.0)
Hemoglobin: 10.3 g/dL — ABNORMAL LOW (ref 12.0–15.0)
MCH: 25.1 pg — ABNORMAL LOW (ref 26.0–34.0)
MCHC: 32.4 g/dL (ref 30.0–36.0)
MCV: 77.6 fL — ABNORMAL LOW (ref 80.0–100.0)
Platelets: 527 10*3/uL — ABNORMAL HIGH (ref 150–400)
RBC: 4.1 MIL/uL (ref 3.87–5.11)
RDW: 15.7 % — ABNORMAL HIGH (ref 11.5–15.5)
WBC: 11.1 10*3/uL — ABNORMAL HIGH (ref 4.0–10.5)
nRBC: 0 % (ref 0.0–0.2)

## 2022-09-08 LAB — BASIC METABOLIC PANEL
Anion gap: 12 (ref 5–15)
BUN: 28 mg/dL — ABNORMAL HIGH (ref 6–20)
CO2: 21 mmol/L — ABNORMAL LOW (ref 22–32)
Calcium: 9.5 mg/dL (ref 8.9–10.3)
Chloride: 102 mmol/L (ref 98–111)
Creatinine, Ser: 1.29 mg/dL — ABNORMAL HIGH (ref 0.44–1.00)
GFR, Estimated: 54 mL/min — ABNORMAL LOW (ref >60)
Glucose, Bld: 238 mg/dL — ABNORMAL HIGH (ref 70–99)
Potassium: 3.3 mmol/L — ABNORMAL LOW (ref 3.5–5.1)
Sodium: 135 mmol/L (ref 135–145)

## 2022-09-08 LAB — MAGNESIUM: Magnesium: 2 mg/dL (ref 1.7–2.4)

## 2022-09-08 LAB — HEMOGLOBIN A1C
Hgb A1c MFr Bld: 12.3 % — ABNORMAL HIGH (ref 4.8–5.6)
Mean Plasma Glucose: 306.31 mg/dL

## 2022-09-08 LAB — FERRITIN: Ferritin: 163 ng/mL (ref 11–307)

## 2022-09-08 LAB — HIV ANTIBODY (ROUTINE TESTING W REFLEX): HIV Screen 4th Generation wRfx: NONREACTIVE

## 2022-09-08 LAB — VITAMIN B12: Vitamin B-12: 452 pg/mL (ref 180–914)

## 2022-09-08 MED ORDER — LORAZEPAM 2 MG/ML IJ SOLN
0.5000 mg | Freq: Once | INTRAMUSCULAR | Status: AC
  Administered 2022-09-08: 0.5 mg via INTRAVENOUS
  Filled 2022-09-08: qty 1

## 2022-09-08 MED ORDER — HYDROCHLOROTHIAZIDE 12.5 MG PO TABS
12.5000 mg | ORAL_TABLET | Freq: Every day | ORAL | Status: DC
  Administered 2022-09-08 – 2022-09-09 (×2): 12.5 mg via ORAL
  Filled 2022-09-08 (×2): qty 1

## 2022-09-08 MED ORDER — INSULIN GLARGINE-YFGN 100 UNIT/ML ~~LOC~~ SOLN
15.0000 [IU] | Freq: Two times a day (BID) | SUBCUTANEOUS | Status: DC
  Administered 2022-09-08 – 2022-09-09 (×3): 15 [IU] via SUBCUTANEOUS
  Filled 2022-09-08 (×4): qty 0.15

## 2022-09-08 MED ORDER — SODIUM CHLORIDE 0.9 % IV SOLN
12.5000 mg | Freq: Four times a day (QID) | INTRAVENOUS | Status: AC | PRN
  Administered 2022-09-08: 12.5 mg via INTRAVENOUS
  Filled 2022-09-08: qty 12.5

## 2022-09-08 MED ORDER — SODIUM CHLORIDE 0.45 % IV SOLN
INTRAVENOUS | Status: DC
  Filled 2022-09-08: qty 1000

## 2022-09-08 MED ORDER — METOCLOPRAMIDE HCL 5 MG/ML IJ SOLN
10.0000 mg | Freq: Three times a day (TID) | INTRAMUSCULAR | Status: AC
  Administered 2022-09-08 (×2): 10 mg via INTRAVENOUS
  Filled 2022-09-08 (×2): qty 2

## 2022-09-08 MED ORDER — METOCLOPRAMIDE HCL 5 MG/ML IJ SOLN
10.0000 mg | Freq: Once | INTRAMUSCULAR | Status: AC
  Administered 2022-09-08: 10 mg via INTRAVENOUS
  Filled 2022-09-08: qty 2

## 2022-09-08 MED ORDER — SODIUM CHLORIDE 0.9 % IV SOLN
12.5000 mg | Freq: Four times a day (QID) | INTRAVENOUS | Status: AC | PRN
  Administered 2022-09-08: 12.5 mg via INTRAVENOUS
  Filled 2022-09-08: qty 0.5

## 2022-09-08 MED ORDER — LORAZEPAM 2 MG/ML IJ SOLN
0.5000 mg | Freq: Three times a day (TID) | INTRAMUSCULAR | Status: DC | PRN
  Administered 2022-09-08 – 2022-09-09 (×4): 0.5 mg via INTRAVENOUS
  Filled 2022-09-08 (×4): qty 1

## 2022-09-08 MED ORDER — LOSARTAN POTASSIUM 25 MG PO TABS
25.0000 mg | ORAL_TABLET | Freq: Every day | ORAL | Status: DC
  Administered 2022-09-08 – 2022-09-09 (×2): 25 mg via ORAL
  Filled 2022-09-08 (×2): qty 1

## 2022-09-08 NOTE — Plan of Care (Signed)

## 2022-09-08 NOTE — Progress Notes (Addendum)
0850 0.5mg  of IV ativan given. MD aware that ativan IV was given last at 0657am this morning.  61 MD aware that pt did not eat any breakfast at all. Semglee subq 30units not given this morning, order changed to 15units.

## 2022-09-08 NOTE — Plan of Care (Addendum)
  Problem: Education: Goal: Knowledge of General Education information will improve Description: Including pain rating scale, medication(s)/side effects and non-pharmacologic comfort measures 09/08/2022 0742 by Shalisha Clausing, Crist Fat, RN Outcome: Progressing 09/08/2022 0514 by Anysia Choi, Crist Fat, RN Outcome: Progressing   Problem: Health Behavior/Discharge Planning: Goal: Ability to manage health-related needs will improve 09/08/2022 0742 by Jaisa Defino, Crist Fat, RN Outcome: Progressing 09/08/2022 0514 by Laconda Basich, Crist Fat, RN Outcome: Progressing   Problem: Clinical Measurements: Goal: Ability to maintain clinical measurements within normal limits will improve 09/08/2022 0742 by Yovani Cogburn, Crist Fat, RN Outcome: Progressing 09/08/2022 0514 by Callan Yontz, Crist Fat, RN Outcome: Progressing Goal: Will remain free from infection 09/08/2022 0742 by Madai Nuccio, Crist Fat, RN Outcome: Progressing 09/08/2022 0514 by Christop Hippert, Crist Fat, RN Outcome: Progressing Goal: Diagnostic test results will improve 09/08/2022 0742 by Imran Nuon, Crist Fat, RN Outcome: Progressing 09/08/2022 0514 by Tekoa Hamor, Crist Fat, RN Outcome: Progressing Goal: Respiratory complications will improve 09/08/2022 0742 by Avondre Richens, Crist Fat, RN Outcome: Progressing 09/08/2022 0514 by Nea Gittens, Crist Fat, RN Outcome: Progressing Goal: Cardiovascular complication will be avoided 09/08/2022 0742 by Marylan Glore, Crist Fat, RN Outcome: Progressing 09/08/2022 0514 by Zayvion Stailey, Crist Fat, RN Outcome: Progressing   Problem: Activity: Goal: Risk for activity intolerance will decrease 09/08/2022 0742 by Jilleen Essner, Crist Fat, RN Outcome: Progressing 09/08/2022 0514 by Ressie Slevin, Crist Fat, RN Outcome: Progressing   Problem: Nutrition: Goal: Adequate nutrition will be maintained 09/08/2022 0742 by Javon Snee, Crist Fat, RN Outcome: Progressing 09/08/2022 0514 by Celestial Barnfield, Crist Fat, RN Outcome: Progressing   Problem: Coping: Goal: Level  of anxiety will decrease 09/08/2022 0742 by Alysen Smylie, Crist Fat, RN Outcome: Progressing 09/08/2022 0514 by Sani Madariaga, Crist Fat, RN Outcome: Progressing   Problem: Elimination: Goal: Will not experience complications related to bowel motility 09/08/2022 0742 by Glendon Dunwoody, Crist Fat, RN Outcome: Progressing 09/08/2022 0514 by Damien Cisar, Crist Fat, RN Outcome: Progressing Goal: Will not experience complications related to urinary retention 09/08/2022 0742 by Jenny Lai, Crist Fat, RN Outcome: Progressing 09/08/2022 0514 by Torion Hulgan, Crist Fat, RN Outcome: Progressing   Problem: Pain Managment: Goal: General experience of comfort will improve 09/08/2022 0742 by Jaedyn Marrufo, Crist Fat, RN Outcome: Progressing 09/08/2022 0514 by Haide Klinker, Crist Fat, RN Outcome: Progressing   Problem: Safety: Goal: Ability to remain free from injury will improve 09/08/2022 0742 by Clarrissa Shimkus, Crist Fat, RN Outcome: Progressing 09/08/2022 0514 by Rebekkah Powless, Crist Fat, RN Outcome: Progressing   Problem: Skin Integrity: Goal: Risk for impaired skin integrity will decrease 09/08/2022 0742 by Deashia Soule, Crist Fat, RN Outcome: Progressing 09/08/2022 0514 by Delio Slates, Crist Fat, RN Outcome: Progressing

## 2022-09-08 NOTE — Plan of Care (Signed)
Patient awake most of the night and requesting medication for anxiety. [name], NP made aware. No new orders. Nausea/vomiting management ongoing. Plan of care reviewed with patient. Call bell within reach.   @0630 : Pt requesting to leave AMA due to not being able to receive medication for her anxiety. [name], NP made aware. See new orders.

## 2022-09-08 NOTE — Progress Notes (Addendum)
Progress Note    Theresa Malone  [id number] DOB: 1981/10/23  DOA: 09/07/2022 PCP: Josephine Cables, MD      Brief Narrative:    Medical records reviewed and are as summarized below:  Theresa Malone is a 41 y.o. female with medical history significant for diabetes mellitus, hypertension, morbid obesity, anxiety, who presented to the hospital with nausea, vomiting and diarrhea that started a day prior to admission.  She said she had eaten some leftover [place] food.  She had chills but no fever.  She was admitted to the hospital for acute gastroenteritis.  She was also found to have severe hyperglycemia and hypertensive urgency. She said she is medically adherent.     Assessment/Plan:   Principal Problem:   Gastroenteritis Active Problems:   Morbid obesity with BMI of 45.0-49.9, adult ([place])   Essential hypertension   Hyponatremia   Microcytic anemia   Depression   Uncontrolled type 2 diabetes mellitus with hyperglycemia, with long-term current use of insulin (HCC)   Body mass index is 47.61 kg/m.  (Morbid obesity): This complicates overall care and prognosis   Acute gastroenteritis: Add IV Phenergan as needed for nausea and vomiting.  Continue analgesics for pain.  Type II DM with severe hyperglycemia: Glucose was 479 on admission.  Hemoglobin A1c was 12.3.  Continue insulin glargine.  Use NovoLog as needed for hyperglycemia.  Patient could not eat breakfast so insulin glargine will be reduced from 30 units twice daily to 15 units twice daily.  Adjust insulin dose as needed  Hypertensive urgency: Restart home losartan and HCTZ.  Monitor BP closely.  Hypokalemia: Replete potassium and monitor levels.  Change IV fluids from normal saline at 125 cc/h to half-normal saline with 40 mEq of potassium chloride at 50 cc/h.  Depression, anxiety: Continue escitalopram.  IV Ativan as needed for anxiety.  Hyponatremia: Improved   Diet Order             Diet Carb  Modified Fluid consistency: Thin; Room service appropriate? Yes  Diet effective now                            Consultants: None  Procedures: None    Medications:    enoxaparin (LOVENOX) injection  0.5 mg/kg Subcutaneous Q24H   escitalopram  40 mg Oral Daily   hydrochlorothiazide  12.5 mg Oral Daily   influenza vac split quadrivalent PF  0.5 mL Intramuscular Tomorrow-1000   insulin aspart  0-20 Units Subcutaneous Q4H   insulin glargine-yfgn  30 Units Subcutaneous BID   losartan  25 mg Oral Daily   pantoprazole (PROTONIX) IV  40 mg Intravenous Q24H   sodium chloride flush  3 mL Intravenous Q12H   Continuous Infusions:  sodium chloride 125 mL/hr at 09/07/22 1750   sodium chloride       Anti-infectives (From admission, onward)    None              Family Communication/Anticipated D/C date and plan/Code Status   DVT prophylaxis:      Code Status: Full Code  Family Communication: Husband at the bedside Disposition Plan: Plan to discharge home tomorrow   Status is: Observation The patient will require care spanning > 2 midnights and should be moved to inpatient because: Nausea and vomiting       Subjective:   She complains of nausea, vomiting (x3 this morning) and anxiety.  She requested medicine for  anxiety.  She said Zofran does not help much.  No diarrhea.  She said she takes ?Tradjenta, losartan 25 mg daily and HCTZ 12.5 mg daily but these were not listed on admission med rec.  Landry Mellow, RN, was at the bedside.  Her husband was at the bedside.  Objective:    Vitals:   09/07/22 2302 09/08/22 0133 09/08/22 0535 09/08/22 0902  BP: (!) 175/86 (!) 177/98 (!) 182/103 (!) 184/89  Pulse:   96 98  Resp:   20 18  Temp:   98 F (36.7 C) 98.1 F (36.7 C)  TempSrc:   Oral   SpO2:   99% 99%  Weight:      Height:       No data found.   Intake/Output Summary (Last 24 hours) at 09/08/2022 1037 Last data filed at 09/08/2022 0500 Gross  per 24 hour  Intake 1246.27 ml  Output 200 ml  Net 1046.27 ml   Filed Weights   09/06/22 2220  Weight: 114.3 kg    Exam:  GEN: NAD SKIN: Warm and dry EYES: EOMI ENT: MMM CV: RRR PULM: CTA B ABD: soft, obese, lower abdominal tenderness without rebound tenderness or guarding, +BS CNS: AAO x 3, non focal EXT: No edema or tenderness        Data Reviewed:   I have personally reviewed following labs and imaging studies:  Labs: Labs show the following:   Basic Metabolic Panel: Recent Labs  Lab 09/06/22 2240 09/08/22 0649  NA 130* 135  K 4.6 3.3*  CL 97* 102  CO2 24 21*  GLUCOSE 472* 238*  BUN 30* 28*  CREATININE 1.32* 1.29*  CALCIUM 9.9 9.5  MG  --  2.0   GFR Estimated Creatinine Clearance: 69.4 mL/min (A) (by C-G formula based on SCr of 1.29 mg/dL (H)). Liver Function Tests: Recent Labs  Lab 09/06/22 2240  AST 19  ALT 14  ALKPHOS 110  BILITOT 0.8  PROT 9.9*  ALBUMIN 3.7   No results for input(s): "LIPASE", "AMYLASE" in the last 168 hours. No results for input(s): "AMMONIA" in the last 168 hours. Coagulation profile No results for input(s): "INR", "PROTIME" in the last 168 hours.  CBC: Recent Labs  Lab 09/06/22 2240 09/08/22 0649  WBC 10.4 11.1*  NEUTROABS 8.3*  --   HGB 10.2* 10.3*  HCT 32.9* 31.8*  MCV 79.5* 77.6*  PLT 531* 527*   Cardiac Enzymes: No results for input(s): "CKTOTAL", "CKMB", "CKMBINDEX", "TROPONINI" in the last 168 hours. BNP (last 3 results) No results for input(s): "PROBNP" in the last 8760 hours. CBG: Recent Labs  Lab 09/07/22 1740 09/07/22 2044 09/08/22 0127 09/08/22 0533 09/08/22 0730  GLUCAP 372* 353* 232* 186* 232*   D-Dimer: No results for input(s): "DDIMER" in the last 72 hours. Hgb A1c: Recent Labs    09/07/22 1843  HGBA1C 12.3*   Lipid Profile: No results for input(s): "CHOL", "HDL", "LDLCALC", "TRIG", "CHOLHDL", "LDLDIRECT" in the last 72 hours. Thyroid function studies: No results for  input(s): "TSH", "T4TOTAL", "T3FREE", "THYROIDAB" in the last 72 hours.  Invalid input(s): "FREET3" Anemia work up: Recent Labs    09/07/22 1843 09/08/22 0649  FERRITIN  --  163  TIBC 378  --   IRON 47  --    Sepsis Labs: Recent Labs  Lab 09/06/22 2240 09/08/22 0649  WBC 10.4 11.1*    Microbiology No results found for this or any previous visit (from the past 240 hour(s)).  Procedures and diagnostic studies:  CT ABDOMEN PELVIS W CONTRAST  Result Date: 09/07/2022 CLINICAL DATA:  Abdominal pain, nausea/vomiting EXAM: CT ABDOMEN AND PELVIS WITH CONTRAST TECHNIQUE: Multidetector CT imaging of the abdomen and pelvis was performed using the standard protocol following bolus administration of intravenous contrast. RADIATION DOSE REDUCTION: This exam was performed according to the departmental dose-optimization program which includes automated exposure control, adjustment of the mA and/or kV according to patient size and/or use of iterative reconstruction technique. CONTRAST:  141mL OMNIPAQUE IOHEXOL 300 MG/ML  SOLN COMPARISON:  CT pelvis dated 03/21/2022. CT abdomen/pelvis dated 09/21/2020. FINDINGS: Lower chest: Lung bases are clear. Hepatobiliary: Liver is within normal limits. Gallbladder is unremarkable. No intrahepatic or extrahepatic ductal dilatation. Pancreas: Within normal limits. Spleen: Within normal limits. Adrenals/Urinary Tract: Adrenal glands are within normal limits. Subcentimeter left upper pole renal cyst (series 2/image 40), measuring simple fluid density, benign (Bosniak I). Right kidney is within normal limits. No hydronephrosis. Bladder is within normal limits. Stomach/Bowel: Stomach is within normal limits. No evidence of bowel obstruction. Normal appendix (series 2/image 50). No colonic wall thickening or inflammatory changes. Vascular/Lymphatic: No evidence of abdominal aortic aneurysm. No suspicious abdominopelvic lymphadenopathy. Reproductive: Uterus is within normal  limits. Bilateral ovaries are within normal limits. Other: No abdominopelvic ascites. Musculoskeletal: Visualized osseous structures are within normal limits. IMPRESSION: Negative CT abdomen/pelvis. Electronically Signed   By: Julian Hy M.D.   On: 09/07/2022 00:55               LOS: 0 days   Milas Schappell  [facility]   Pager on [email or web address]. If 7PM-7AM, please contact night-coverage at [email or web address]     09/08/2022, 10:37 AM

## 2022-09-09 DIAGNOSIS — K529 Noninfective gastroenteritis and colitis, unspecified: Secondary | ICD-10-CM | POA: Diagnosis not present

## 2022-09-09 DIAGNOSIS — E871 Hypo-osmolality and hyponatremia: Secondary | ICD-10-CM | POA: Diagnosis not present

## 2022-09-09 DIAGNOSIS — I10 Essential (primary) hypertension: Secondary | ICD-10-CM | POA: Diagnosis not present

## 2022-09-09 DIAGNOSIS — R1110 Vomiting, unspecified: Secondary | ICD-10-CM | POA: Diagnosis not present

## 2022-09-09 DIAGNOSIS — Z6841 Body Mass Index (BMI) 40.0 and over, adult: Secondary | ICD-10-CM

## 2022-09-09 LAB — CBC WITH DIFFERENTIAL/PLATELET
Abs Immature Granulocytes: 0.06 10*3/uL (ref 0.00–0.07)
Basophils Absolute: 0 10*3/uL (ref 0.0–0.1)
Basophils Relative: 0 %
Eosinophils Absolute: 0 10*3/uL (ref 0.0–0.5)
Eosinophils Relative: 0 %
HCT: 34.5 % — ABNORMAL LOW (ref 36.0–46.0)
Hemoglobin: 10.8 g/dL — ABNORMAL LOW (ref 12.0–15.0)
Immature Granulocytes: 1 %
Lymphocytes Relative: 20 %
Lymphs Abs: 1.8 10*3/uL (ref 0.7–4.0)
MCH: 24.6 pg — ABNORMAL LOW (ref 26.0–34.0)
MCHC: 31.3 g/dL (ref 30.0–36.0)
MCV: 78.6 fL — ABNORMAL LOW (ref 80.0–100.0)
Monocytes Absolute: 0.7 10*3/uL (ref 0.1–1.0)
Monocytes Relative: 8 %
Neutro Abs: 6.6 10*3/uL (ref 1.7–7.7)
Neutrophils Relative %: 71 %
Platelets: 453 10*3/uL — ABNORMAL HIGH (ref 150–400)
RBC: 4.39 MIL/uL (ref 3.87–5.11)
RDW: 15.9 % — ABNORMAL HIGH (ref 11.5–15.5)
WBC: 9.2 10*3/uL (ref 4.0–10.5)
nRBC: 0 % (ref 0.0–0.2)

## 2022-09-09 LAB — GLUCOSE, CAPILLARY
Glucose-Capillary: 175 mg/dL — ABNORMAL HIGH (ref 70–99)
Glucose-Capillary: 194 mg/dL — ABNORMAL HIGH (ref 70–99)
Glucose-Capillary: 212 mg/dL — ABNORMAL HIGH (ref 70–99)

## 2022-09-09 LAB — MAGNESIUM: Magnesium: 2 mg/dL (ref 1.7–2.4)

## 2022-09-09 LAB — BASIC METABOLIC PANEL
Anion gap: 9 (ref 5–15)
BUN: 25 mg/dL — ABNORMAL HIGH (ref 6–20)
CO2: 22 mmol/L (ref 22–32)
Calcium: 9.3 mg/dL (ref 8.9–10.3)
Chloride: 102 mmol/L (ref 98–111)
Creatinine, Ser: 1.19 mg/dL — ABNORMAL HIGH (ref 0.44–1.00)
GFR, Estimated: >60 mL/min (ref >60)
Glucose, Bld: 231 mg/dL — ABNORMAL HIGH (ref 70–99)
Potassium: 3.4 mmol/L — ABNORMAL LOW (ref 3.5–5.1)
Sodium: 133 mmol/L — ABNORMAL LOW (ref 135–145)

## 2022-09-09 LAB — PHOSPHORUS: Phosphorus: 3.8 mg/dL (ref 2.5–4.6)

## 2022-09-09 MED ORDER — HYDROCHLOROTHIAZIDE 25 MG PO TABS: 25.0000 mg | ORAL_TABLET | Freq: Every day | ORAL | Status: DC

## 2022-09-09 MED ORDER — HYDROCHLOROTHIAZIDE 12.5 MG PO TABS
12.5000 mg | ORAL_TABLET | Freq: Once | ORAL | Status: AC
  Administered 2022-09-09: 12.5 mg via ORAL
  Filled 2022-09-09: qty 1

## 2022-09-09 MED ORDER — POTASSIUM CHLORIDE CRYS ER 10 MEQ PO TBCR: 10.0000 meq | EXTENDED_RELEASE_TABLET | Freq: Every day | ORAL | 0 refills | Status: DC

## 2022-09-09 MED ORDER — LABETALOL HCL 5 MG/ML IV SOLN
10.0000 mg | Freq: Once | INTRAVENOUS | Status: AC
  Administered 2022-09-09: 10 mg via INTRAVENOUS
  Filled 2022-09-09: qty 4

## 2022-09-09 MED ORDER — POTASSIUM CHLORIDE CRYS ER 20 MEQ PO TBCR
40.0000 meq | EXTENDED_RELEASE_TABLET | Freq: Once | ORAL | Status: AC
  Administered 2022-09-09: 40 meq via ORAL
  Filled 2022-09-09: qty 2

## 2022-09-09 MED ORDER — SODIUM CHLORIDE 0.9 % IV SOLN
12.5000 mg | Freq: Four times a day (QID) | INTRAVENOUS | Status: AC | PRN
  Administered 2022-09-09: 12.5 mg via INTRAVENOUS
  Filled 2022-09-09: qty 0.5

## 2022-09-09 NOTE — Plan of Care (Signed)

## 2022-09-09 NOTE — Plan of Care (Signed)

## 2022-09-09 NOTE — Progress Notes (Signed)
1214 D/C AVS completed and reviewed with pt. All opportunities for questions answered and clarified. IV removed. Pt will be wheeled down to car at medical mall entrance via wheelchair.

## 2022-09-09 NOTE — Discharge Summary (Addendum)
Physician Discharge Summary   Patient: Theresa Malone MRN: [id number] DOB: 01-31-1981  Admit date:     09/07/2022  Discharge date: 09/09/22  Discharge Physician: Jennye Boroughs   PCP: Josephine Cables, MD   Recommendations at discharge:   Follow-up with PCP in 1 week  Discharge Diagnoses: Principal Problem:   Gastroenteritis Active Problems:   Morbid obesity with BMI of 45.0-49.9, adult ([place])   Essential hypertension   Hyponatremia   Intractable vomiting   Microcytic anemia   Depression   Uncontrolled type 2 diabetes mellitus with hyperglycemia, with long-term current use of insulin (HCC)  Resolved Problems:   * No resolved hospital problems. *  Hospital Course:  Theresa Malone is a 41 y.o. female with medical history significant for diabetes mellitus, hypertension, morbid obesity, anxiety, who presented to the hospital with nausea, vomiting and diarrhea that started a day prior to admission.  She said she had eaten some leftover [place] food.  She had chills but no fever.   She was admitted to the hospital for acute gastroenteritis.  She was also found to have severe hyperglycemia and hypertensive urgency. She said she is medically adherent.  She was treated with IV fluids, antiemetics and analgesics.  She had hypokalemia that was repleted.  She was hyponatremic on admission and this improved with IV fluids.  She was also treated with insulin and required Ativan for anxiety.  All Theresa symptoms have resolved.  She is feeling better and deemed stable for discharge.  Regarding Theresa blood pressure, she said Theresa blood pressure is usually normal at home and a significantly elevated blood pressure in the hospital may be temporary.  Hemoglobin A1c was 12.3 which suggests that Theresa diabetes is poorly controlled.  The importance of adequate glucose control was reiterated.  Close follow-up with PCP for monitoring was recommended.  Discharge plan was discussed with the patient and Theresa Malone  at the bedside.        Consultants: None Procedures performed: None  Disposition: Home Diet recommendation:  Discharge Diet Orders (From admission, onward)     Start     Ordered   09/09/22 0000  Diet - low sodium heart healthy        09/09/22 1145   09/09/22 0000  Diet Carb Modified        09/09/22 1145           Cardiac and Carb modified diet DISCHARGE MEDICATION: Allergies as of 09/09/2022       Reactions   Sulfa Antibiotics Hives   Lisinopril Cough   cough        Medication List     TAKE these medications    escitalopram 20 MG tablet Commonly known as: LEXAPRO Take 40 mg by mouth daily.   glimepiride 2 MG tablet Commonly known as: Amaryl Take 1 tablet (2 mg total) by mouth every morning.   hydrOXYzine 50 MG tablet Commonly known as: ATARAX Take 50 mg by mouth 3 (three) times daily as needed for anxiety.   Lantus SoloStar 100 UNIT/ML Solostar Pen Generic drug: insulin glargine Inject 56-60 Units into the skin in the morning and at bedtime.   losartan 25 MG tablet Commonly known as: COZAAR Take 1 tablet (25 mg total) by mouth daily. Start taking on: September 10, 2022   potassium chloride 10 MEQ tablet Commonly known as: KLOR-CON M Take 1 tablet (10 mEq total) by mouth daily.        Discharge Exam: [employer]  09/06/22 2220  Weight: 114.3 kg   GEN: NAD SKIN: Warm and dry EYES: EOMI ENT: MMM CV: RRR PULM: CTA B ABD: soft, obese, NT, +BS CNS: AAO x 3, non focal EXT: No edema or tenderness   Condition at discharge: good  The results of significant diagnostics from this hospitalization (including imaging, microbiology, ancillary and laboratory) are listed below for reference.   Imaging Studies: CT ABDOMEN PELVIS W CONTRAST  Result Date: 09/07/2022 CLINICAL DATA:  Abdominal pain, nausea/vomiting EXAM: CT ABDOMEN AND PELVIS WITH CONTRAST TECHNIQUE: Multidetector CT imaging of the abdomen and pelvis was performed using the  standard protocol following bolus administration of intravenous contrast. RADIATION DOSE REDUCTION: This exam was performed according to the departmental dose-optimization program which includes automated exposure control, adjustment of the mA and/or kV according to patient size and/or use of iterative reconstruction technique. CONTRAST:  126mL OMNIPAQUE IOHEXOL 300 MG/ML  SOLN COMPARISON:  CT pelvis dated 03/21/2022. CT abdomen/pelvis dated 09/21/2020. FINDINGS: Lower chest: Lung bases are clear. Hepatobiliary: Liver is within normal limits. Gallbladder is unremarkable. No intrahepatic or extrahepatic ductal dilatation. Pancreas: Within normal limits. Spleen: Within normal limits. Adrenals/Urinary Tract: Adrenal glands are within normal limits. Subcentimeter left upper pole renal cyst (series 2/image 40), measuring simple fluid density, benign (Bosniak I). Right kidney is within normal limits. No hydronephrosis. Bladder is within normal limits. Stomach/Bowel: Stomach is within normal limits. No evidence of bowel obstruction. Normal appendix (series 2/image 50). No colonic wall thickening or inflammatory changes. Vascular/Lymphatic: No evidence of abdominal aortic aneurysm. No suspicious abdominopelvic lymphadenopathy. Reproductive: Uterus is within normal limits. Bilateral ovaries are within normal limits. Other: No abdominopelvic ascites. Musculoskeletal: Visualized osseous structures are within normal limits. IMPRESSION: Negative CT abdomen/pelvis. Electronically Signed   By: Julian Hy M.D.   On: 09/07/2022 00:55   CT Head Wo Contrast  Result Date: 08/23/2022 CLINICAL DATA:  Neuro deficit, acute, stroke suspected EXAM: CT HEAD WITHOUT CONTRAST TECHNIQUE: Contiguous axial images were obtained from the base of the skull through the vertex without intravenous contrast. RADIATION DOSE REDUCTION: This exam was performed according to the departmental dose-optimization program which includes automated  exposure control, adjustment of the mA and/or kV according to patient size and/or use of iterative reconstruction technique. COMPARISON:  None Available. FINDINGS: Brain: No evidence of acute infarction, hemorrhage, hydrocephalus, extra-axial collection or mass lesion/mass effect. Vascular: No hyperdense vessel identified. Skull: No acute fracture. Sinuses/Orbits: Left ethmoid and sphenoid paranasal sinus mucosal thickening. No acute orbital findings. Other: No mastoid effusions. IMPRESSION: No evidence of acute intracranial abnormality. Electronically Signed   By: Margaretha Sheffield M.D.   On: 08/23/2022 08:02   DG Chest 2 View  Result Date: 08/23/2022 CLINICAL DATA:  Chest pain. EXAM: CHEST - 2 VIEW COMPARISON:  October 27, 21. FINDINGS: The heart size and mediastinal contours are within normal limits. Both lungs are clear. No visible pleural effusions or pneumothorax. No acute osseous abnormality. IMPRESSION: No active cardiopulmonary disease. Electronically Signed   By: Margaretha Sheffield M.D.   On: 08/23/2022 07:49    Microbiology: Results for orders placed or performed during the hospital encounter of 08/23/22  Resp Panel by RT-PCR (Flu A&B, Covid) Anterior Nasal Swab     Status: None   Collection Time: 08/23/22  8:16 AM   Specimen: Anterior Nasal Swab  Result Value Ref Range Status   SARS Coronavirus 2 by RT PCR NEGATIVE NEGATIVE Final    Comment: (NOTE) SARS-CoV-2 target nucleic acids are NOT DETECTED.  The SARS-CoV-2 RNA  is generally detectable in upper respiratory specimens during the acute phase of infection. The lowest concentration of SARS-CoV-2 viral copies this assay can detect is 138 copies/mL. A negative result does not preclude SARS-Cov-2 infection and should not be used as the sole basis for treatment or other patient management decisions. A negative result may occur with  improper specimen collection/handling, submission of specimen other than nasopharyngeal swab, presence of  viral mutation(s) within the areas targeted by this assay, and inadequate number of viral copies(<138 copies/mL). A negative result must be combined with clinical observations, patient history, and epidemiological information. The expected result is Negative.  Fact Sheet for Patients:  EntrepreneurPulse.com.au  Fact Sheet for Healthcare Providers:  IncredibleEmployment.be  This test is no t yet approved or cleared by the [place] FDA and  has been authorized for detection and/or diagnosis of SARS-CoV-2 by FDA under an Emergency Use Authorization (EUA). This EUA will remain  in effect (meaning this test can be used) for the duration of the COVID-19 declaration under Section 564(b)(1) of the Act, 21 U.S.C.section 360bbb-3(b)(1), unless the authorization is terminated  or revoked sooner.       Influenza A by PCR NEGATIVE NEGATIVE Final   Influenza B by PCR NEGATIVE NEGATIVE Final    Comment: (NOTE) The Xpert Xpress SARS-CoV-2/FLU/RSV plus assay is intended as an aid in the diagnosis of influenza from Nasopharyngeal swab specimens and should not be used as a sole basis for treatment. Nasal washings and aspirates are unacceptable for Xpert Xpress SARS-CoV-2/FLU/RSV testing.  Fact Sheet for Patients: EntrepreneurPulse.com.au  Fact Sheet for Healthcare Providers: IncredibleEmployment.be  This test is not yet approved or cleared by the [place] FDA and has been authorized for detection and/or diagnosis of SARS-CoV-2 by FDA under an Emergency Use Authorization (EUA). This EUA will remain in effect (meaning this test can be used) for the duration of the COVID-19 declaration under Section 564(b)(1) of the Act, 21 U.S.C. section 360bbb-3(b)(1), unless the authorization is terminated or revoked.  Performed at [facility], [address] [place], [place] [address]     Labs: CBC: Recent  Labs  Lab 09/06/22 2240 09/08/22 0649 09/09/22 0838  WBC 10.4 11.1* 9.2  NEUTROABS 8.3*  --  6.6  HGB 10.2* 10.3* 10.8*  HCT 32.9* 31.8* 34.5*  MCV 79.5* 77.6* 78.6*  PLT 531* 527* 0000000*   Basic Metabolic Panel: Recent Labs  Lab 09/06/22 2240 09/08/22 0649 09/09/22 0838  NA 130* 135 133*  K 4.6 3.3* 3.4*  CL 97* 102 102  CO2 24 21* 22  GLUCOSE 472* 238* 231*  BUN 30* 28* 25*  CREATININE 1.32* 1.29* 1.19*  CALCIUM 9.9 9.5 9.3  MG  --  2.0 2.0  PHOS  --   --  3.8   Liver Function Tests: Recent Labs  Lab 09/06/22 2240  AST 19  ALT 14  ALKPHOS 110  BILITOT 0.8  PROT 9.9*  ALBUMIN 3.7   CBG: Recent Labs  Lab 09/08/22 2004 09/08/22 2349 09/09/22 0021 09/09/22 0453 09/09/22 0838  GLUCAP 218* 188* 175* 194* 212*    Discharge time spent: greater than 30 minutes.  Signed: Jennye Boroughs, MD [facility] 09/09/2022

## 2022-09-10 ENCOUNTER — Inpatient Hospital Stay
Admission: EM | Admit: 2022-09-10 | Discharge: 2022-09-14 | DRG: 392 | Disposition: A | Discharge status: Home / Self Care | Payer: Medicaid Other | Attending: Internal Medicine | Admitting: Internal Medicine

## 2022-09-10 ENCOUNTER — Other Ambulatory Visit: Payer: Self-pay

## 2022-09-10 ENCOUNTER — Encounter: Payer: Self-pay | Admitting: Internal Medicine

## 2022-09-10 ENCOUNTER — Emergency Department: Payer: Medicaid Other

## 2022-09-10 DIAGNOSIS — N1831 Chronic kidney disease, stage 3a: Secondary | ICD-10-CM | POA: Diagnosis present

## 2022-09-10 DIAGNOSIS — Z833 Family history of diabetes mellitus: Secondary | ICD-10-CM

## 2022-09-10 DIAGNOSIS — Z882 Allergy status to sulfonamides status: Secondary | ICD-10-CM

## 2022-09-10 DIAGNOSIS — F418 Other specified anxiety disorders: Secondary | ICD-10-CM | POA: Diagnosis present

## 2022-09-10 DIAGNOSIS — D509 Iron deficiency anemia, unspecified: Secondary | ICD-10-CM | POA: Diagnosis present

## 2022-09-10 DIAGNOSIS — Z1152 Encounter for screening for COVID-19: Secondary | ICD-10-CM

## 2022-09-10 DIAGNOSIS — F419 Anxiety disorder, unspecified: Secondary | ICD-10-CM | POA: Diagnosis present

## 2022-09-10 DIAGNOSIS — M109 Gout, unspecified: Secondary | ICD-10-CM | POA: Diagnosis present

## 2022-09-10 DIAGNOSIS — R197 Diarrhea, unspecified: Secondary | ICD-10-CM | POA: Diagnosis present

## 2022-09-10 DIAGNOSIS — E876 Hypokalemia: Secondary | ICD-10-CM | POA: Diagnosis present

## 2022-09-10 DIAGNOSIS — N179 Acute kidney failure, unspecified: Secondary | ICD-10-CM | POA: Diagnosis present

## 2022-09-10 DIAGNOSIS — R059 Cough, unspecified: Secondary | ICD-10-CM | POA: Diagnosis present

## 2022-09-10 DIAGNOSIS — R748 Abnormal levels of other serum enzymes: Secondary | ICD-10-CM | POA: Diagnosis present

## 2022-09-10 DIAGNOSIS — F32A Depression, unspecified: Secondary | ICD-10-CM | POA: Diagnosis present

## 2022-09-10 DIAGNOSIS — E1159 Type 2 diabetes mellitus with other circulatory complications: Secondary | ICD-10-CM | POA: Diagnosis present

## 2022-09-10 DIAGNOSIS — I152 Hypertension secondary to endocrine disorders: Secondary | ICD-10-CM | POA: Diagnosis present

## 2022-09-10 DIAGNOSIS — R112 Nausea with vomiting, unspecified: Principal | ICD-10-CM | POA: Diagnosis present

## 2022-09-10 DIAGNOSIS — Z888 Allergy status to other drugs, medicaments and biological substances status: Secondary | ICD-10-CM

## 2022-09-10 DIAGNOSIS — I129 Hypertensive chronic kidney disease with stage 1 through stage 4 chronic kidney disease, or unspecified chronic kidney disease: Secondary | ICD-10-CM | POA: Diagnosis present

## 2022-09-10 DIAGNOSIS — E1165 Type 2 diabetes mellitus with hyperglycemia: Secondary | ICD-10-CM | POA: Diagnosis present

## 2022-09-10 DIAGNOSIS — Z8249 Family history of ischemic heart disease and other diseases of the circulatory system: Secondary | ICD-10-CM

## 2022-09-10 DIAGNOSIS — E1129 Type 2 diabetes mellitus with other diabetic kidney complication: Secondary | ICD-10-CM | POA: Diagnosis present

## 2022-09-10 DIAGNOSIS — E1122 Type 2 diabetes mellitus with diabetic chronic kidney disease: Secondary | ICD-10-CM | POA: Diagnosis present

## 2022-09-10 DIAGNOSIS — Z7984 Long term (current) use of oral hypoglycemic drugs: Secondary | ICD-10-CM

## 2022-09-10 DIAGNOSIS — D72829 Elevated white blood cell count, unspecified: Secondary | ICD-10-CM | POA: Diagnosis present

## 2022-09-10 DIAGNOSIS — R9431 Abnormal electrocardiogram [ECG] [EKG]: Secondary | ICD-10-CM | POA: Diagnosis present

## 2022-09-10 DIAGNOSIS — I10 Essential (primary) hypertension: Secondary | ICD-10-CM

## 2022-09-10 DIAGNOSIS — E860 Dehydration: Secondary | ICD-10-CM | POA: Diagnosis present

## 2022-09-10 DIAGNOSIS — Z79899 Other long term (current) drug therapy: Secondary | ICD-10-CM

## 2022-09-10 DIAGNOSIS — R1013 Epigastric pain: Secondary | ICD-10-CM | POA: Diagnosis present

## 2022-09-10 DIAGNOSIS — Z794 Long term (current) use of insulin: Secondary | ICD-10-CM

## 2022-09-10 DIAGNOSIS — Z6841 Body Mass Index (BMI) 40.0 and over, adult: Secondary | ICD-10-CM

## 2022-09-10 LAB — COMPREHENSIVE METABOLIC PANEL
ALT: 14 U/L (ref 0–44)
AST: 16 U/L (ref 15–41)
Albumin: 3.6 g/dL (ref 3.5–5.0)
Alkaline Phosphatase: 104 U/L (ref 38–126)
Anion gap: 12 (ref 5–15)
BUN: 33 mg/dL — ABNORMAL HIGH (ref 6–20)
CO2: 18 mmol/L — ABNORMAL LOW (ref 22–32)
Calcium: 9.7 mg/dL (ref 8.9–10.3)
Chloride: 101 mmol/L (ref 98–111)
Creatinine, Ser: 1.55 mg/dL — ABNORMAL HIGH (ref 0.44–1.00)
GFR, Estimated: 44 mL/min — ABNORMAL LOW (ref >60)
Glucose, Bld: 342 mg/dL — ABNORMAL HIGH (ref 70–99)
Potassium: 3.6 mmol/L (ref 3.5–5.1)
Sodium: 131 mmol/L — ABNORMAL LOW (ref 135–145)
Total Bilirubin: 1.3 mg/dL — ABNORMAL HIGH (ref 0.3–1.2)
Total Protein: 8.8 g/dL — ABNORMAL HIGH (ref 6.5–8.1)

## 2022-09-10 LAB — CBC WITH DIFFERENTIAL/PLATELET
Abs Immature Granulocytes: 0.07 10*3/uL (ref 0.00–0.07)
Basophils Absolute: 0 10*3/uL (ref 0.0–0.1)
Basophils Relative: 0 %
Eosinophils Absolute: 0 10*3/uL (ref 0.0–0.5)
Eosinophils Relative: 0 %
HCT: 36 % (ref 36.0–46.0)
Hemoglobin: 11.2 g/dL — ABNORMAL LOW (ref 12.0–15.0)
Immature Granulocytes: 1 %
Lymphocytes Relative: 25 %
Lymphs Abs: 3.1 10*3/uL (ref 0.7–4.0)
MCH: 24.8 pg — ABNORMAL LOW (ref 26.0–34.0)
MCHC: 31.1 g/dL (ref 30.0–36.0)
MCV: 79.8 fL — ABNORMAL LOW (ref 80.0–100.0)
Monocytes Absolute: 1 10*3/uL (ref 0.1–1.0)
Monocytes Relative: 8 %
Neutro Abs: 8 10*3/uL — ABNORMAL HIGH (ref 1.7–7.7)
Neutrophils Relative %: 66 %
Platelets: 662 10*3/uL — ABNORMAL HIGH (ref 150–400)
RBC: 4.51 MIL/uL (ref 3.87–5.11)
RDW: 16.2 % — ABNORMAL HIGH (ref 11.5–15.5)
WBC: 12.1 10*3/uL — ABNORMAL HIGH (ref 4.0–10.5)
nRBC: 0 % (ref 0.0–0.2)

## 2022-09-10 LAB — URINALYSIS, ROUTINE W REFLEX MICROSCOPIC
Bilirubin Urine: NEGATIVE
Glucose, UA: >=500 mg/dL — AB
Ketones, ur: 5 mg/dL — AB
Leukocytes,Ua: NEGATIVE
Nitrite: NEGATIVE
Protein, ur: 100 mg/dL — AB
RBC / HPF: >50 RBC/hpf — ABNORMAL HIGH (ref 0–5)
Specific Gravity, Urine: 1.022 (ref 1.005–1.030)
pH: 5 (ref 5.0–8.0)

## 2022-09-10 LAB — BLOOD GAS, VENOUS
Acid-base deficit: 1.2 mmol/L (ref 0.0–2.0)
Bicarbonate: 18.2 mmol/L — ABNORMAL LOW (ref 20.0–28.0)
O2 Saturation: 83.5 %
Patient temperature: 37
pCO2, Ven: 19 mmHg — CL (ref 44–60)
pH, Ven: 7.59 — ABNORMAL HIGH (ref 7.25–7.43)
pO2, Ven: 48 mmHg — ABNORMAL HIGH (ref 32–45)

## 2022-09-10 LAB — BETA-HYDROXYBUTYRIC ACID: Beta-Hydroxybutyric Acid: 1.14 mmol/L — ABNORMAL HIGH (ref 0.05–0.27)

## 2022-09-10 LAB — CBG MONITORING, ED
Glucose-Capillary: 310 mg/dL — ABNORMAL HIGH (ref 70–99)
Glucose-Capillary: 223 mg/dL — ABNORMAL HIGH (ref 70–99)
Glucose-Capillary: 204 mg/dL — ABNORMAL HIGH (ref 70–99)

## 2022-09-10 LAB — POC URINE PREG, ED: Preg Test, Ur: NEGATIVE

## 2022-09-10 LAB — LIPASE, BLOOD: Lipase: 69 U/L — ABNORMAL HIGH (ref 11–51)

## 2022-09-10 MED ORDER — INSULIN GLARGINE-YFGN 100 UNIT/ML ~~LOC~~ SOLN
20.0000 [IU] | Freq: Two times a day (BID) | SUBCUTANEOUS | Status: DC
  Administered 2022-09-10 – 2022-09-13 (×8): 20 [IU] via SUBCUTANEOUS
  Filled 2022-09-10 (×9): qty 0.2

## 2022-09-10 MED ORDER — SODIUM CHLORIDE 0.9 % IV BOLUS
1000.0000 mL | Freq: Once | INTRAVENOUS | Status: AC
  Administered 2022-09-10: 1000 mL via INTRAVENOUS

## 2022-09-10 MED ORDER — HYDROXYZINE HCL 25 MG PO TABS
50.0000 mg | ORAL_TABLET | Freq: Three times a day (TID) | ORAL | Status: DC | PRN
  Administered 2022-09-10: 50 mg via ORAL
  Filled 2022-09-10: qty 2

## 2022-09-10 MED ORDER — INSULIN GLARGINE-YFGN 100 UNIT/ML ~~LOC~~ SOLN
40.0000 [IU] | Freq: Two times a day (BID) | SUBCUTANEOUS | Status: DC
  Filled 2022-09-10: qty 0.4

## 2022-09-10 MED ORDER — DIPHENHYDRAMINE HCL 50 MG/ML IJ SOLN
25.0000 mg | Freq: Once | INTRAMUSCULAR | Status: AC
  Administered 2022-09-10: 25 mg via INTRAVENOUS
  Filled 2022-09-10: qty 1

## 2022-09-10 MED ORDER — FAMOTIDINE IN NACL 20-0.9 MG/50ML-% IV SOLN
20.0000 mg | Freq: Once | INTRAVENOUS | Status: AC
  Administered 2022-09-10: 20 mg via INTRAVENOUS
  Filled 2022-09-10: qty 50

## 2022-09-10 MED ORDER — INSULIN ASPART 100 UNIT/ML IJ SOLN
0.0000 [IU] | Freq: Three times a day (TID) | INTRAMUSCULAR | Status: DC
  Administered 2022-09-10: 7 [IU] via SUBCUTANEOUS
  Administered 2022-09-10: 3 [IU] via SUBCUTANEOUS
  Administered 2022-09-11 (×2): 2 [IU] via SUBCUTANEOUS
  Administered 2022-09-11: 1 [IU] via SUBCUTANEOUS
  Administered 2022-09-12: 2 [IU] via SUBCUTANEOUS
  Administered 2022-09-12: 1 [IU] via SUBCUTANEOUS
  Administered 2022-09-12: 2 [IU] via SUBCUTANEOUS
  Administered 2022-09-13: 3 [IU] via SUBCUTANEOUS
  Administered 2022-09-13: 7 [IU] via SUBCUTANEOUS
  Filled 2022-09-10 (×10): qty 1

## 2022-09-10 MED ORDER — INSULIN ASPART 100 UNIT/ML IJ SOLN
0.0000 [IU] | Freq: Every day | INTRAMUSCULAR | Status: DC
  Administered 2022-09-10: 2 [IU] via SUBCUTANEOUS
  Filled 2022-09-10: qty 1

## 2022-09-10 MED ORDER — FENTANYL CITRATE PF 50 MCG/ML IJ SOSY
25.0000 ug | PREFILLED_SYRINGE | INTRAMUSCULAR | Status: DC | PRN
  Administered 2022-09-11 – 2022-09-13 (×7): 25 ug via INTRAVENOUS
  Filled 2022-09-10 (×7): qty 1

## 2022-09-10 MED ORDER — MORPHINE SULFATE (PF) 2 MG/ML IV SOLN: 2.0000 mg | INTRAVENOUS | Status: DC | PRN

## 2022-09-10 MED ORDER — SODIUM CHLORIDE 0.9 % IV SOLN
12.5000 mg | Freq: Once | INTRAVENOUS | Status: AC
  Administered 2022-09-10: 12.5 mg via INTRAVENOUS
  Filled 2022-09-10: qty 0.5

## 2022-09-10 MED ORDER — AMLODIPINE BESYLATE 5 MG PO TABS
5.0000 mg | ORAL_TABLET | Freq: Every day | ORAL | Status: DC
  Administered 2022-09-10 – 2022-09-14 (×5): 5 mg via ORAL
  Filled 2022-09-10 (×5): qty 1

## 2022-09-10 MED ORDER — ESCITALOPRAM OXALATE 10 MG PO TABS: 40.0000 mg | ORAL_TABLET | Freq: Every day | ORAL | Status: DC

## 2022-09-10 MED ORDER — SODIUM CHLORIDE 0.9 % IV SOLN
25.0000 mg | Freq: Once | INTRAVENOUS | Status: AC
  Administered 2022-09-10: 25 mg via INTRAVENOUS
  Filled 2022-09-10: qty 1

## 2022-09-10 MED ORDER — DIPHENHYDRAMINE HCL 50 MG/ML IJ SOLN
25.0000 mg | Freq: Four times a day (QID) | INTRAMUSCULAR | Status: DC | PRN
  Administered 2022-09-10 – 2022-09-13 (×7): 25 mg via INTRAVENOUS
  Filled 2022-09-10 (×7): qty 1

## 2022-09-10 MED ORDER — ACETAMINOPHEN 650 MG RE SUPP: 650.0000 mg | Freq: Four times a day (QID) | RECTAL | Status: DC | PRN

## 2022-09-10 MED ORDER — SODIUM CHLORIDE 0.9 % IV SOLN: INTRAVENOUS | Status: DC

## 2022-09-10 MED ORDER — ENOXAPARIN SODIUM 60 MG/0.6ML IJ SOSY: 0.5000 mg/kg | PREFILLED_SYRINGE | INTRAMUSCULAR | Status: DC

## 2022-09-10 MED ORDER — LORAZEPAM 2 MG/ML IJ SOLN
1.0000 mg | Freq: Three times a day (TID) | INTRAMUSCULAR | Status: DC | PRN
  Administered 2022-09-10 – 2022-09-13 (×7): 1 mg via INTRAVENOUS
  Filled 2022-09-10 (×7): qty 1

## 2022-09-10 MED ORDER — LORAZEPAM 2 MG/ML IJ SOLN
1.0000 mg | Freq: Once | INTRAMUSCULAR | Status: AC
  Administered 2022-09-10: 1 mg via INTRAVENOUS
  Filled 2022-09-10: qty 1

## 2022-09-10 MED ORDER — HALOPERIDOL LACTATE 5 MG/ML IJ SOLN
5.0000 mg | Freq: Once | INTRAMUSCULAR | Status: DC
  Filled 2022-09-10: qty 1

## 2022-09-10 MED ORDER — DM-GUAIFENESIN ER 30-600 MG PO TB12: 1.0000 | ORAL_TABLET | Freq: Two times a day (BID) | ORAL | Status: DC | PRN

## 2022-09-10 MED ORDER — SODIUM CHLORIDE 0.9 % IV SOLN
12.5000 mg | Freq: Once | INTRAVENOUS | Status: AC
  Administered 2022-09-10: 12.5 mg via INTRAVENOUS
  Filled 2022-09-10: qty 12.5

## 2022-09-10 MED ORDER — HYDRALAZINE HCL 20 MG/ML IJ SOLN: 5.0000 mg | INTRAMUSCULAR | Status: DC | PRN

## 2022-09-10 MED ORDER — ACETAMINOPHEN 325 MG PO TABS
650.0000 mg | ORAL_TABLET | Freq: Four times a day (QID) | ORAL | Status: DC | PRN
  Administered 2022-09-10: 650 mg via ORAL
  Filled 2022-09-10: qty 2

## 2022-09-10 NOTE — Assessment & Plan Note (Signed)
WBC 12.1, no clear source of infection identified. -Follow-up with CBC

## 2022-09-10 NOTE — Assessment & Plan Note (Signed)
  BMI= 47.49   and BW= 114 kg -Diet and exercise.   -Encourage to lose weight.

## 2022-09-10 NOTE — Assessment & Plan Note (Signed)
Chest x-ray negative. -Follow-up COVID PCR -As needed Mucinex

## 2022-09-10 NOTE — Assessment & Plan Note (Signed)
-   Avoid using QT prolonging medications such as Zofran

## 2022-09-10 NOTE — Assessment & Plan Note (Signed)
Etiology is not clear.  Patient had a negative CT scan of the abdomen/pelvis on 09/07/2022.  UDS was negative in that admission.  -Placed on telemetry bed for position -Check C. difficile and GI pathogen panel -IV fluid: 2 L normal saline, followed by 100 cc/h -As needed Benadryl for nausea vomiting (QTc prolonged patient, cannot use Zofran) -As needed fentanyl for abdominal pain

## 2022-09-10 NOTE — ED Triage Notes (Addendum)
Pt states vomiting that has been going on for several days with associated diarrhea. Pt appears in distress, states she feels like she is going to die. Pt also complains of generalized abd pain. Pt is an insulin dependent diabetic per pt.

## 2022-09-10 NOTE — Assessment & Plan Note (Signed)
-   Hold home Lexapro and hydroxyzine due to QTc prolonged patient

## 2022-09-10 NOTE — ED Provider Notes (Signed)
[facility] Provider Note    Event Date/Time   First MD Initiated Contact with Patient 09/10/22 [phone]     (approximate)   History   Vomiting   HPI  Theresa Malone is a 41 y.o. female who returns to the ED from home with intractable nausea and vomiting.  Patient was seen in the ED 09/07/2022 for same; had negative CT scan with and was admitted to the hospital for intractable N/V.  States she was discharged yesterday afternoon and has been vomiting since.  Also endorses some loose stools.  Concerned she is in DKA.  Endorses upper abdominal discomfort.  Denies chest pain, shortness of breath, dysuria.     Past Medical History   Past Medical History:  Diagnosis Date   Anxiety    panic attacks   Cellulitis    Depression    Diabetes mellitus    Gout    Hidradenitis suppurativa    Hypertension    Obesity    Venous stasis    Venous stasis dermatitis      Active Problem List   Patient Active Problem List   Diagnosis Date Noted   Gastroenteritis 09/07/2022   Depression    Uncontrolled type 2 diabetes mellitus with hyperglycemia, with long-term current use of insulin ([place])    Cyst of left ovary 07/21/2021   Microcytic anemia 04/17/2021   Encounter for gynecological examination with Papanicolaou smear of cervix 03/22/2021   Dysmenorrhea 12/15/2020   Encounter for IUD removal 12/15/2020   Sepsis without acute organ dysfunction (HCC)    Acute pyelonephritis 09/22/2020   Pyelonephritis 09/22/2020   Emesis, persistent 05/21/2019   Cellulitis    Intractable vomiting with nausea 04/13/2019   Acute respiratory alkalosis    Intractable vomiting    Chest pain    Cellulitis of right lower extremity 04/12/2019   Intractable abdominal pain 02/58/5277   Metabolic alkalosis 82/42/3536   Respiratory alkalosis 04/11/2019   Volume depletion 04/11/2019   Hypokalemia 04/11/2019   Hyponatremia 04/11/2019   Yeast vaginitis 03/13/2018   Essential hypertension  02/07/2016   Menorrhagia with regular cycle 02/07/2016   Frequent loose stools 02/07/2016   Status post cesarean section 01/25/2015   Hidradenitis axillaris 09/15/2014   Susceptible to varicella (non-immune), currently pregnant 07/24/2014   Hidradenitis suppurativa 07/20/2014   Morbid obesity with BMI of 45.0-49.9, adult ([place]) 03/03/2014     Past Surgical History   Past Surgical History:  Procedure Laterality Date   CESAREAN SECTION     C/S x 2   CESAREAN SECTION N/A 01/24/2015   Procedure: CESAREAN SECTION;  Surgeon: Mora Bellman, MD;  Location: [facility];  Service: Obstetrics;  Laterality: N/A;   TUBAL LIGATION       Home Medications   Prior to Admission medications   Medication Sig Start Date End Date Taking? Authorizing Provider  escitalopram (LEXAPRO) 20 MG tablet Take 40 mg by mouth daily. 08/27/22   [provider]  glimepiride (AMARYL) 2 MG tablet Take 1 tablet (2 mg total) by mouth every morning. 08/23/22 08/23/23  Naaman Plummer, MD  hydrOXYzine (ATARAX) 50 MG tablet Take 50 mg by mouth 3 (three) times daily as needed for anxiety.    [provider]  insulin glargine (LANTUS SOLOSTAR) 100 UNIT/ML Solostar Pen Inject 56-60 Units into the skin in the morning and at bedtime. 08/23/22   Naaman Plummer, MD  losartan (COZAAR) 25 MG tablet Take 1 tablet (25 mg total) by mouth daily. 09/10/22  Lurene Shadow, MD  potassium chloride (KLOR-CON M) 10 MEQ tablet Take 1 tablet (10 mEq total) by mouth daily. 09/09/22   Lurene Shadow, MD  clonazepam (KLONOPIN) 0.125 MG disintegrating tablet Take 1 tablet (0.125 mg total) by mouth 2 (two) times daily. Patient not taking: Reported on 05/19/2019 04/15/19 05/21/19  Catarina Hartshorn, MD     Allergies  Sulfa antibiotics and Lisinopril   Family History   Family History  Problem Relation Age of Onset   Diabetes Paternal Grandfather    Cancer Paternal Grandfather        liver   Cancer Paternal Grandmother        liver    Hypertension Father    Diabetes Mother    Hypertension Mother    Diabetes Maternal Uncle    Diabetes Maternal Grandmother    Cancer Maternal Grandfather    Diabetes Daughter        boarderline    Asthma Daughter    Bronchitis Daughter    Bronchitis Son    Bronchitis Daughter      Physical Exam  Triage Vital Signs: ED Triage Vitals  Enc Vitals Group     BP 09/10/22 0138 (!) 133/98     Pulse Rate 09/10/22 0138 (!) 145     Resp 09/10/22 0138 (!) 26     Temp 09/10/22 0138 98.1 F (36.7 C)     Temp Source 09/10/22 0138 Oral     SpO2 09/10/22 0138 100 %     Weight 09/10/22 0139 251 lb 5.2 oz (114 kg)     Height 09/10/22 0139 5\' 1"  (1.549 m)     Head Circumference --      Peak Flow --      Pain Score --      Pain Loc --      Pain Edu? --      Excl. in GC? --     Updated Vital Signs: BP (!) 142/94   Pulse (!) 119   Temp 98.1 F (36.7 C) (Oral)   Resp 13   Ht 5\' 1"  (1.549 m)   Wt 114 kg   LMP 08/16/2022 (Approximate)   SpO2 98%   BMI 47.49 kg/m    General: Awake, moderate distress. CV:  Tachycardic.  Good peripheral perfusion.  Resp:  Increased effort.  CTA B. Abd:  Mild epigastric tenderness to palpation without rebound or guarding.  No distention.  Other:  No truncal vesicles.   ED Results / Procedures / Treatments  Labs (all labs ordered are listed, but only abnormal results are displayed) Labs Reviewed  CBC WITH DIFFERENTIAL/PLATELET - Abnormal; Notable for the following components:      Result Value   WBC 12.1 (*)    Hemoglobin 11.2 (*)    MCV 79.8 (*)    MCH 24.8 (*)    RDW 16.2 (*)    Platelets 662 (*)    Neutro Abs 8.0 (*)    All other components within normal limits  COMPREHENSIVE METABOLIC PANEL - Abnormal; Notable for the following components:   Sodium 131 (*)    CO2 18 (*)    Glucose, Bld 342 (*)    BUN 33 (*)    Creatinine, Ser 1.55 (*)    Total Protein 8.8 (*)    Total Bilirubin 1.3 (*)    GFR, Estimated 44 (*)    All other  components within normal limits  BLOOD GAS, VENOUS - Abnormal; Notable for the following components:   pH,  Ven 7.59 (*)    pCO2, Ven 19 (*)    pO2, Ven 48 (*)    Bicarbonate 18.2 (*)    All other components within normal limits  LIPASE, BLOOD - Abnormal; Notable for the following components:   Lipase 69 (*)    All other components within normal limits  BETA-HYDROXYBUTYRIC ACID - Abnormal; Notable for the following components:   Beta-Hydroxybutyric Acid 1.14 (*)    All other components within normal limits  URINALYSIS, ROUTINE W REFLEX MICROSCOPIC  CBG MONITORING, ED     EKG  ED ECG REPORT I, Michella Detjen J, the attending physician, personally viewed and interpreted this ECG.   Date: 09/10/2022  EKG Time: 0229  Rate: 117  Rhythm: sinus tachycardia  Axis: Normal  Intervals: QTc 502  ST&T Change: Nonspecific    RADIOLOGY I have independently visualized and interpreted patient's chest x-ray as well as noted the radiology interpretation:  Chest x-ray: No acute cardiopulmonary process  Official radiology report(s): DG Chest Port 1 View  Result Date: 09/10/2022 CLINICAL DATA:  Nausea and vomiting EXAM: PORTABLE CHEST 1 VIEW COMPARISON:  08/23/2022 FINDINGS: The heart size and mediastinal contours are within normal limits. Both lungs are clear. The visualized skeletal structures are unremarkable. IMPRESSION: No active disease. Electronically Signed   By: Deatra Robinson M.D.   On: 09/10/2022 02:51     PROCEDURES:  Critical Care performed: Yes, see critical care procedure note(s)  CRITICAL CARE Performed by: Irean Hong   Total critical care time: 45 minutes  Critical care time was exclusive of separately billable procedures and treating other patients.  Critical care was necessary to treat or prevent imminent or life-threatening deterioration.  Critical care was time spent personally by me on the following activities: development of treatment plan with patient and/or  surrogate as well as nursing, discussions with consultants, evaluation of patient's response to treatment, examination of patient, obtaining history from patient or surrogate, ordering and performing treatments and interventions, ordering and review of laboratory studies, ordering and review of radiographic studies, pulse oximetry and re-evaluation of patient's condition.   Marland Kitchen1-3 Lead EKG Interpretation  Performed by: Irean Hong, MD Authorized by: Irean Hong, MD     Interpretation: abnormal     ECG rate:  145   ECG rate assessment: tachycardic     Rhythm: sinus tachycardia     Ectopy: none     Conduction: normal   Comments:     Patient placed on cardiac monitor to evaluate for arrhythmias    MEDICATIONS ORDERED IN ED: Medications  promethazine (PHENERGAN) 12.5 mg in sodium chloride 0.9 % 50 mL IVPB (has no administration in time range)  sodium chloride 0.9 % bolus 1,000 mL (1,000 mLs Intravenous New Bag/Given 09/10/22 0310)  sodium chloride 0.9 % bolus 1,000 mL (1,000 mLs Intravenous New Bag/Given 09/10/22 0221)  promethazine (PHENERGAN) 12.5 mg in sodium chloride 0.9 % 50 mL IVPB (0 mg Intravenous Stopped 09/10/22 0247)  famotidine (PEPCID) IVPB 20 mg premix (0 mg Intravenous Stopped 09/10/22 0309)  LORazepam (ATIVAN) injection 1 mg (1 mg Intravenous Given 09/10/22 0243)  diphenhydrAMINE (BENADRYL) injection 25 mg (25 mg Intravenous Given 09/10/22 0306)     IMPRESSION / MDM / ASSESSMENT AND PLAN / ED COURSE  I reviewed the triage vital signs and the nursing notes.                              41 year old female  who returns to the ED with intractable nausea/vomiting. Differential diagnosis includes, but is not limited to, biliary disease (biliary colic, acute cholecystitis, cholangitis, choledocholithiasis, etc), intrathoracic causes for epigastric abdominal pain including ACS, gastritis, duodenitis, pancreatitis, small bowel or large bowel obstruction, abdominal aortic  aneurysm, hernia, and ulcer(s).  I have personally reviewed her records and note her recent hospitalization 09/07/2022.  It is noted patient had a negative CT scan as well as negative UDS.  Patient's presentation is most consistent with acute presentation with potential threat to life or bodily function.  The patient is on the cardiac monitor to evaluate for evidence of arrhythmia and/or significant heart rate changes.  We will obtain we will obtain lab work to include VBG and beta hydroxybutyrate acid to evaluate for DKA; initiate IV fluid resuscitation, IV Phenergan for nausea as patient states that works best for her.  She is also requesting something for her nerves but states Ativan does not work well; will administer IV Haldol for this purpose and it should also help with nausea.  Anticipate readmission to the hospital for intractable nausea/vomiting.  Clinical Course as of 09/10/22 0329  Mon Sep 10, 2022  0233 Haldol canceled for QTc 502; will trial IV Ativan instead.  AKI noted with creatinine 1.55 which is elevated from recent; hyperglycemia without elevation of anion gap. [JS]  [id number] Patient complains of persistent nausea.  IV fluids infusing.  Will recheck blood sugar after completion of IV fluids and proceed with insulin if glucose is still elevated.  Will consult hospitalist services for evaluation and admission. [JS]    Clinical Course User Index [JS] Irean Hong, MD     FINAL CLINICAL IMPRESSION(S) / ED DIAGNOSES   Final diagnoses:  Intractable nausea and vomiting  Epigastric pain     Rx / DC Orders   ED Discharge Orders     None        Note:  This document was prepared using Dragon voice recognition software and may include unintentional dictation errors.   Irean Hong, MD 09/10/22 [phone]

## 2022-09-10 NOTE — Assessment & Plan Note (Signed)
Baseline creatinine 1.29 on 09/08/22.  Her creatinine is 1.55, BUN 33, GFR 44.  Likely due to dehydration and continuation of Cozaar.  Urinalysis showed cloudy appearance, many bacteria, WBC 10-20, but has squamous cell contamination.  Patient denies symptoms of UTI. -Hold Cozaar -IV fluid as above -Follow-up urine culture

## 2022-09-10 NOTE — H&P (Signed)
History and Physical    Theresa Malone:[id number] DOB: [birth year] DOA: 09/10/2022  Referring MD/NP/PA:   PCP: Ethelda Chick, MD   Patient coming from:  The patient is coming from home.  At baseline, pt is independent for most of ADL.        Chief Complaint: Severe nausea vomiting and diarrhea, abdominal pain.  HPI: Theresa Malone is a 41 y.o. female with medical history significant of hypertension, diabetes mellitus, depression with anxiety, morbid obesity obesity BMI 47.49, anemia, CKD-3, who presents with severe nausea, vomiting, diarrhea and abdominal pain.  Patient was recently hospitalized from 10/13 - 10/15 due to nausea vomiting, diarrhea.  Patient had negative UDS and negative CT scan of abdomen/pelvis.  Patient states that after she went home, she continues to have nausea, vomiting and diarrhea and abdominal pain.  She has seen numerous times vomiting and few times of loose stool diarrhea each day.  She said she vomited orange-colored materials.  Her abdominal pain is diffuse, mild to moderate, aching, nonradiating.  No fever or chills.  Patient also has dry cough, and shortness breath only at night while sleeping, no chest pain.  Denies symptoms of UTI.  Data reviewed independently and ED Course: pt was found to have WBC 12.1, lipase 69, negative pregnancy test, beta hydroxybutyric acid 1.14, worsening renal function, blood sugar 342, anion gap 12, urinalysis (cloudy appearance, negative leukocyte, many bacteria, WBC 10-20, with squamous cell contamination 11-20), temperature normal, blood pressure 160/90, heart rate 145, 103, RR 26, oxygen saturation 98% on room air.  Chest x-ray negative.  Patient is placed on telemetry bed for position.  EKG: I have personally reviewed.  Sinus rhythm, QTc 502, possible left atrial enlargement, poor R wave question.   Review of Systems:   General: no fevers, chills, no body weight gain, has poor appetite, has fatigue HEENT: no blurry  vision, hearing changes or sore throat Respiratory: has dyspnea in night, has coughing, no wheezing CV: no chest pain, no palpitations GI: has nausea, vomiting, abdominal pain, diarrhea, no constipation GU: no dysuria, burning on urination, increased urinary frequency, hematuria  Ext: no leg edema Neuro: no unilateral weakness, numbness, or tingling, no vision change or hearing loss Skin: no rash, no skin tear. MSK: No muscle spasm, no deformity, no limitation of range of movement in spin Heme: No easy bruising.  Travel history: No recent long distant travel.   Allergy:  Allergies  Allergen Reactions   Sulfa Antibiotics Hives   Lisinopril Cough    cough    Past Medical History:  Diagnosis Date   Anxiety    panic attacks   Cellulitis    Depression    Diabetes mellitus    Gout    Hidradenitis suppurativa    Hypertension    Obesity    Venous stasis    Venous stasis dermatitis     Past Surgical History:  Procedure Laterality Date   CESAREAN SECTION     C/S x 2   CESAREAN SECTION N/A 01/24/2015   Procedure: CESAREAN SECTION;  Surgeon: Catalina Antigua, MD;  Location: [facility];  Service: Obstetrics;  Laterality: N/A;   TUBAL LIGATION      Social History:  reports that she has never smoked. She has never used smokeless tobacco. She reports that she does not currently use alcohol. She reports that she does not use drugs.  Family History:  Family History  Problem Relation Age of Onset   Diabetes Paternal Grandfather  Cancer Paternal Grandfather        liver   Cancer Paternal Grandmother        liver   Hypertension Father    Diabetes Mother    Hypertension Mother    Diabetes Maternal Uncle    Diabetes Maternal Grandmother    Cancer Maternal Grandfather    Diabetes Daughter        boarderline    Asthma Daughter    Bronchitis Daughter    Bronchitis Son    Bronchitis Daughter      Prior to Admission medications   Medication Sig Start Date End Date Taking?  Authorizing Provider  escitalopram (LEXAPRO) 20 MG tablet Take 40 mg by mouth daily. 08/27/22  Yes [provider]  glimepiride (AMARYL) 2 MG tablet Take 1 tablet (2 mg total) by mouth every morning. 08/23/22 08/23/23 Yes Bradler, Vista Lawman, MD  insulin glargine (LANTUS SOLOSTAR) 100 UNIT/ML Solostar Pen Inject 56-60 Units into the skin in the morning and at bedtime. 08/23/22  Yes Naaman Plummer, MD  losartan (COZAAR) 25 MG tablet Take 1 tablet (25 mg total) by mouth daily. 09/10/22  Yes Jennye Boroughs, MD  potassium chloride (KLOR-CON M) 10 MEQ tablet Take 1 tablet (10 mEq total) by mouth daily. 09/09/22  Yes Jennye Boroughs, MD  hydrOXYzine (ATARAX) 50 MG tablet Take 50 mg by mouth 3 (three) times daily as needed for anxiety.    [provider]  clonazepam (KLONOPIN) 0.125 MG disintegrating tablet Take 1 tablet (0.125 mg total) by mouth 2 (two) times daily. Patient not taking: Reported on 05/19/2019 04/15/19 05/21/19  Orson Eva, MD    Physical Exam: Vitals:   09/10/22 1056 09/10/22 1200 09/10/22 1400 09/10/22 1700  BP: (!) 153/98 (!) 147/107 (!) 135/92 132/88  Pulse: (!) 121  (!) 115 (!) 110  Resp: 16  18 20   Temp: 98.2 F (36.8 C)  98.7 F (37.1 C) 98.7 F (37.1 C)  TempSrc: Oral   Oral  SpO2: 94%  95% 96%  Weight:      Height:       General: Not in acute distress HEENT:       Eyes: PERRL, EOMI, no scleral icterus.       ENT: No discharge from the ears and nose, no pharynx injection, no tonsillar enlargement.        Neck: No JVD, no bruit, no mass felt. Heme: No neck lymph node enlargement. Cardiac: S1/S2, RRR, No murmurs, No gallops or rubs. Respiratory: No rales, wheezing, rhonchi or rubs. GI: Soft, nondistended, has diffused tenderness, no rebound pain, no organomegaly, BS present. GU: No hematuria Ext: No pitting leg edema bilaterally. 1+DP/PT pulse bilaterally. Musculoskeletal: No joint deformities, No joint redness or warmth, no limitation of ROM in spin. Skin:  No rashes.  Neuro: Alert, oriented X3, cranial nerves II-XII grossly intact, moves all extremities normally. Psych: Patient is not psychotic, no suicidal or hemocidal ideation.  Labs on Admission: I have personally reviewed following labs and imaging studies  CBC: Recent Labs  Lab 09/06/22 2240 09/08/22 0649 09/09/22 0838 09/10/22 0153  WBC 10.4 11.1* 9.2 12.1*  NEUTROABS 8.3*  --  6.6 8.0*  HGB 10.2* 10.3* 10.8* 11.2*  HCT 32.9* 31.8* 34.5* 36.0  MCV 79.5* 77.6* 78.6* 79.8*  PLT 531* 527* 453* 244*   Basic Metabolic Panel: Recent Labs  Lab 09/06/22 2240 09/08/22 0649 09/09/22 0838 09/10/22 0153  NA 130* 135 133* 131*  K 4.6 3.3* 3.4* 3.6  CL 97*  102 102 101  CO2 24 21* 22 18*  GLUCOSE 472* 238* 231* 342*  BUN 30* 28* 25* 33*  CREATININE 1.32* 1.29* 1.19* 1.55*  CALCIUM 9.9 9.5 9.3 9.7  MG  --  2.0 2.0  --   PHOS  --   --  3.8  --    GFR: Estimated Creatinine Clearance: 57.7 mL/min (A) (by C-G formula based on SCr of 1.55 mg/dL (H)). Liver Function Tests: Recent Labs  Lab 09/06/22 2240 09/10/22 0153  AST 19 16  ALT 14 14  ALKPHOS 110 104  BILITOT 0.8 1.3*  PROT 9.9* 8.8*  ALBUMIN 3.7 3.6   Recent Labs  Lab 09/10/22 0153  LIPASE 69*   No results for input(s): "AMMONIA" in the last 168 hours. Coagulation Profile: No results for input(s): "INR", "PROTIME" in the last 168 hours. Cardiac Enzymes: No results for input(s): "CKTOTAL", "CKMB", "CKMBINDEX", "TROPONINI" in the last 168 hours. BNP (last 3 results) No results for input(s): "PROBNP" in the last 8760 hours. HbA1C: No results for input(s): "HGBA1C" in the last 72 hours.  CBG: Recent Labs  Lab 09/09/22 0021 09/09/22 0453 09/09/22 0838 09/10/22 1100 09/10/22 1707  GLUCAP 175* 194* 212* 310* 223*   Lipid Profile: No results for input(s): "CHOL", "HDL", "LDLCALC", "TRIG", "CHOLHDL", "LDLDIRECT" in the last 72 hours. Thyroid Function Tests: No results for input(s): "TSH", "T4TOTAL",  "FREET4", "T3FREE", "THYROIDAB" in the last 72 hours. Anemia Panel: Recent Labs    09/08/22 0649  VITAMINB12 452  FERRITIN 163   Urine analysis:    Component Value Date/Time   COLORURINE YELLOW (A) 09/10/2022 0824   APPEARANCEUR CLOUDY (A) 09/10/2022 0824   LABSPEC 1.022 09/10/2022 0824   PHURINE 5.0 09/10/2022 0824   GLUCOSEU >=500 (A) 09/10/2022 0824   HGBUR LARGE (A) 09/10/2022 0824   BILIRUBINUR NEGATIVE 09/10/2022 0824   KETONESUR 5 (A) 09/10/2022 0824   PROTEINUR 100 (A) 09/10/2022 0824   UROBILINOGEN 0.2 02/05/2015 2125   NITRITE NEGATIVE 09/10/2022 0824   LEUKOCYTESUR NEGATIVE 09/10/2022 0824   Sepsis Labs: @LABRCNTIP (procalcitonin:4,lacticidven:4) )No results found for this or any previous visit (from the past 240 hour(s)).   Radiological Exams on Admission: DG Chest Port 1 View  Result Date: 09/10/2022 CLINICAL DATA:  Nausea and vomiting EXAM: PORTABLE CHEST 1 VIEW COMPARISON:  08/23/2022 FINDINGS: The heart size and mediastinal contours are within normal limits. Both lungs are clear. The visualized skeletal structures are unremarkable. IMPRESSION: No active disease. Electronically Signed   By: 08/25/2022 M.D.   On: 09/10/2022 02:51      Assessment/Plan Principal Problem:   Nausea vomiting and diarrhea Active Problems:   Acute renal failure superimposed on stage 3a chronic kidney disease (HCC)   Microcytic anemia   Leukocytosis   Type II diabetes mellitus with renal manifestations (HCC)   Essential hypertension   Elevated lipase   Depression with anxiety   Prolonged QT interval   Cough   Morbid obesity with BMI of 45.0-49.9, adult (HCC)   Assessment and Plan: * Nausea vomiting and diarrhea Etiology is not clear.  Patient had a negative CT scan of the abdomen/pelvis on 09/07/2022.  UDS was negative in that admission.  -Placed on telemetry bed for position -Check C. difficile and GI pathogen panel -IV fluid: 2 L normal saline, followed by 100  cc/h -As needed Benadryl for nausea vomiting (QTc prolonged patient, cannot use Zofran) -As needed fentanyl for abdominal pain  Acute renal failure superimposed on stage 3a chronic kidney disease (HCC)  Baseline creatinine 1.29 on 09/08/22.  Her creatinine is 1.55, BUN 33, GFR 44.  Likely due to dehydration and continuation of Cozaar.  Urinalysis showed cloudy appearance, many bacteria, WBC 10-20, but has squamous cell contamination.  Patient denies symptoms of UTI. -Hold Cozaar -IV fluid as above -Follow-up urine culture  Microcytic anemia Hemoglobin stable -Follow-up with CBC  Leukocytosis WBC 12.1, no clear source of infection identified. -Follow-up with CBC  Type II diabetes mellitus with renal manifestations (HCC) Recent A1c 12.3.  Patient is taking Amaryl and Lantus at home. -Sliding scale insulin -Glargine insulin 20 units twice daily per diabetic educator's recommendation  Essential hypertension - IV hydralazine as needed -Hold Cozaar due to worsening renal function -Start amlodipine 5 mg daily  Elevated lipase Lipase is 69, CT scan of abdomen/pelvis on 09/07/2022 did not show signs of pancreatitis. -Repeat lipase in the morning  Depression with anxiety - Hold home Lexapro and hydroxyzine due to QTc prolonged patient  Prolonged QT interval - Avoid using QT prolonging medications such as Zofran  Cough Chest x-ray negative. -Follow-up COVID PCR -As needed Mucinex  Morbid obesity with BMI of 45.0-49.9, adult (HCC)  BMI= 47.49   and BW= 114 kg -Diet and exercise.   -Encourage to lose weight.           DVT ppx: SCD  Code Status: Full code  Family Communication: not done, no family member is at bed side.     Disposition Plan:  Anticipate discharge back to previous environment  Consults called:  none  Admission status and Level of care: Telemetry Medical:    obs as     Dispo: The patient is from: Home              Anticipated d/c is to: Home               Anticipated d/c date is: 1 day              Patient currently is not medically stable to d/c.    Severity of Illness:  The appropriate patient status for this patient is OBSERVATION. Observation status is judged to be reasonable and necessary in order to provide the required intensity of service to ensure the patient's safety. The patient's presenting symptoms, physical exam findings, and initial radiographic and laboratory data in the context of their medical condition is felt to place them at decreased risk for further clinical deterioration. Furthermore, it is anticipated that the patient will be medically stable for discharge from the hospital within 2 midnights of admission.        Date of Service 09/10/2022    Lorretta Harp [facility]   If 7PM-7AM, please contact night-coverage [email or web address] 09/10/2022, 6:48 PM

## 2022-09-10 NOTE — Assessment & Plan Note (Addendum)
Recent A1c 12.3.  Patient is taking Amaryl and Lantus at home. -Sliding scale insulin -Glargine insulin 20 units twice daily per diabetic educator's recommendation

## 2022-09-10 NOTE — Assessment & Plan Note (Signed)
Lipase is 69, CT scan of abdomen/pelvis on 09/07/2022 did not show signs of pancreatitis. -Repeat lipase in the morning

## 2022-09-10 NOTE — Assessment & Plan Note (Signed)
-   IV hydralazine as needed -Hold Cozaar due to worsening renal function -Start amlodipine 5 mg daily

## 2022-09-10 NOTE — Assessment & Plan Note (Signed)
Hemoglobin stable -Follow-up with CBC

## 2022-09-10 NOTE — Inpatient Diabetes Management (Addendum)
Inpatient Diabetes Program Recommendations  AACE/ADA: New Consensus Statement on Inpatient Glycemic Control   Target Ranges:  Prepandial:   less than 140 mg/dL      Peak postprandial:   less than 180 mg/dL (1-2 hours)      Critically ill patients:  140 - 180 mg/dL    Latest Reference Range & Units 09/10/22 11:00  Glucose-Capillary 70 - 99 mg/dL 310 (H)    Latest Reference Range & Units 09/10/22 01:53  CO2 22 - 32 mmol/L 18 (L)  Glucose 70 - 99 mg/dL 342 (H)  Anion gap 5 - 15  12    Latest Reference Range & Units 09/07/22 18:43  Hemoglobin A1C 4.8 - 5.6 % 12.3 (H)   Review of Glycemic Control  Diabetes history: DM2 Outpatient Diabetes medications: Lantus 75 units daily (not taking; reports her doctor stopped insulin 1 week ago when started on Tradjenta), Tradjenta 5 mg daily, Metformin 1000 mg BID Current orders for Inpatient glycemic control: Semglee 40 units BID, Novolog 0-9 units TID with meals, Novolog 0-5 units QHS  Inpatient Diabetes Program Recommendations:    Insulin: Please consider decreasing Semglee to 20 units BID.  NOTE: Patient recently inpatient 09/07/22-09/09/22 with gastroenteritis.  Patient presented back to the hospital on 09/10/22 with ongoing vomiting and diarrhea. Lab glucose 342 mg/dl on 09/10/22 at 1:53 am. In reviewing chart, noted video visit on 07/06/22 which has Lantus 56-60 units BID, Metformin 1000 mg BID, and Victoza 1.2 mg QAM listed as DM medications.  Addendum 09/10/22@12 :45-Spoke with patient and her husband at bedside. Patient very drowsy (reports that she was recently given medication which is making her sleepy). Patient reports that she is taking Metformin 1000 mg BID and Tradjenta 5 mg daily for DM control. Inquired about Lantus insulin and patient states that her doctor stopped the Lantus when she was started on Tradjenta about 1 week ago. Inquired about Victoza and patient states she use to take Victoza but has not used in several months. Patient  reports that since she stopped taking insulin, her glucose has been consistently over 400 mg/dl. Explained that she likely needs to be taking insulin and question if her provider wanted her to decrease the dose versus stopping it completely. Patient again states that her doctor told her to stop taking the insulin. Patient reports that last A1C was in 10% range. Discussed A1C of 12.3% on 09/07/22 indicating an average glucose of 306 mg/dl over the past 2-3 months. Discussed glucose and A1C goals. Patient states she is willing to resume insulin and has plenty of Lantus insulin at home. Encouraged patient to be sure to pay attention to discharge instructions in regards to DM medications. Patient verbalized understanding of information discussed and has no questions at this time. Per chart, PCP is listed as Dr. Josephine Cables; called her office and had to leave message for [facility], CMA of Dr. Mancel Bale to inquire about patient's DM medications and any recent instructions to stop insulin.  Thanks, Barnie Alderman, RN, MSN, [place] Diabetes Coordinator Inpatient Diabetes Program [phone] (Team Pager from 8am to [place])

## 2022-09-11 DIAGNOSIS — R112 Nausea with vomiting, unspecified: Secondary | ICD-10-CM | POA: Diagnosis not present

## 2022-09-11 DIAGNOSIS — R197 Diarrhea, unspecified: Secondary | ICD-10-CM | POA: Diagnosis not present

## 2022-09-11 LAB — GLUCOSE, CAPILLARY
Glucose-Capillary: 180 mg/dL — ABNORMAL HIGH (ref 70–99)
Glucose-Capillary: 129 mg/dL — ABNORMAL HIGH (ref 70–99)
Glucose-Capillary: 169 mg/dL — ABNORMAL HIGH (ref 70–99)

## 2022-09-11 LAB — BASIC METABOLIC PANEL
Anion gap: 6 (ref 5–15)
BUN: 27 mg/dL — ABNORMAL HIGH (ref 6–20)
CO2: 22 mmol/L (ref 22–32)
Calcium: 9 mg/dL (ref 8.9–10.3)
Chloride: 109 mmol/L (ref 98–111)
Creatinine, Ser: 1.11 mg/dL — ABNORMAL HIGH (ref 0.44–1.00)
GFR, Estimated: >60 mL/min (ref >60)
Glucose, Bld: 177 mg/dL — ABNORMAL HIGH (ref 70–99)
Potassium: 3.6 mmol/L (ref 3.5–5.1)
Sodium: 137 mmol/L (ref 135–145)

## 2022-09-11 LAB — CBC
HCT: 33.9 % — ABNORMAL LOW (ref 36.0–46.0)
Hemoglobin: 10.4 g/dL — ABNORMAL LOW (ref 12.0–15.0)
MCH: 25.2 pg — ABNORMAL LOW (ref 26.0–34.0)
MCHC: 30.7 g/dL (ref 30.0–36.0)
MCV: 82.1 fL (ref 80.0–100.0)
Platelets: 443 10*3/uL — ABNORMAL HIGH (ref 150–400)
RBC: 4.13 MIL/uL (ref 3.87–5.11)
RDW: 15.9 % — ABNORMAL HIGH (ref 11.5–15.5)
WBC: 10.5 10*3/uL (ref 4.0–10.5)
nRBC: 0 % (ref 0.0–0.2)

## 2022-09-11 LAB — URINE CULTURE

## 2022-09-11 LAB — LIPASE, BLOOD: Lipase: 75 U/L — ABNORMAL HIGH (ref 11–51)

## 2022-09-11 LAB — CBG MONITORING, ED: Glucose-Capillary: 186 mg/dL — ABNORMAL HIGH (ref 70–99)

## 2022-09-11 LAB — SARS CORONAVIRUS 2 BY RT PCR: SARS Coronavirus 2 by RT PCR: NEGATIVE

## 2022-09-11 MED ORDER — HYDROXYZINE HCL 25 MG PO TABS
50.0000 mg | ORAL_TABLET | ORAL | Status: AC | PRN
  Administered 2022-09-13: 50 mg via ORAL
  Filled 2022-09-11: qty 2

## 2022-09-11 MED ORDER — SODIUM CHLORIDE 0.9 % IV SOLN
6.2500 mg | Freq: Once | INTRAVENOUS | Status: AC
  Administered 2022-09-11: 6.25 mg via INTRAVENOUS
  Filled 2022-09-11: qty 0.25

## 2022-09-11 MED ORDER — METOCLOPRAMIDE HCL 5 MG PO TABS: 5.0000 mg | ORAL_TABLET | Freq: Four times a day (QID) | ORAL | Status: DC | PRN

## 2022-09-11 MED ORDER — PANTOPRAZOLE SODIUM 40 MG IV SOLR
40.0000 mg | Freq: Two times a day (BID) | INTRAVENOUS | Status: DC
  Administered 2022-09-11 – 2022-09-13 (×5): 40 mg via INTRAVENOUS
  Filled 2022-09-11 (×5): qty 10

## 2022-09-11 MED ORDER — SODIUM CHLORIDE 0.9 % IV SOLN
25.0000 mg | Freq: Once | INTRAVENOUS | Status: AC
  Administered 2022-09-11: 25 mg via INTRAVENOUS
  Filled 2022-09-11: qty 25

## 2022-09-11 MED ORDER — METOCLOPRAMIDE HCL 5 MG/ML IJ SOLN
5.0000 mg | Freq: Four times a day (QID) | INTRAMUSCULAR | Status: DC | PRN
  Administered 2022-09-11 – 2022-09-13 (×5): 10 mg via INTRAVENOUS
  Filled 2022-09-11 (×5): qty 2

## 2022-09-11 NOTE — Progress Notes (Signed)
Progress Note   Patient: Theresa Malone UTM:[id number] DOB: [birth year] DOA: 09/10/2022     0 DOS: the patient was seen and examined on 09/11/2022   Brief hospital course: No notes on file  Assessment and Plan: * Nausea vomiting and diarrhea Etiology is not clear.  Patient had a negative CT scan of the abdomen/pelvis on 09/07/2022.  UDS was negative in that admission.  -Placed on telemetry bed for position -Check C. difficile and GI pathogen panel -IV fluid: 2 L normal saline, followed by 100 cc/h -As needed Benadryl for nausea vomiting (QTc prolonged patient, cannot use Zofran) -As needed fentanyl for abdominal pain -Patient has significant epigastric pain, suspect gastritis start the patient on Protonix and Maalox. - Downgrade diet to clear liquid diet and see if the patient tolerates it.  Acute renal failure superimposed on stage 3a chronic kidney disease ([place]) Baseline creatinine 1.29 on 09/08/22.  Her creatinine is 1.55, BUN 33, GFR 44.  Likely due to dehydration and continuation of Cozaar.  Urinalysis showed cloudy appearance, many bacteria, WBC 10-20, but has squamous cell contamination.  Patient denies symptoms of UTI. -Hold Cozaar -IV fluid as above creatinine improved from 1.5-1.1 -Follow-up urine culture, shows multi species.  Probable contamination.  Microcytic anemia Hemoglobin stable -Follow-up with CBC  Leukocytosis WBC 12.1, no clear source of infection identified. -Follow-up with CBC, trending down  Type II diabetes mellitus with renal manifestations (HCC) Recent A1c 12.3.  Patient is taking Amaryl and Lantus at home. -Sliding scale insulin -Glargine insulin 20 units twice daily per diabetic educator's recommendation  Essential hypertension - IV hydralazine as needed -Hold Cozaar due to worsening renal function -Start amlodipine 5 mg daily  Elevated lipase Lipase is 69, CT scan of abdomen/pelvis on 09/07/2022 did not show signs of pancreatitis. -Repeat  lipase in the morning is 75 but still does not met criteria for Pancreatitis  Depression with anxiety - Hold home Lexapro and hydroxyzine due to QTc prolonged patient  Prolonged QT interval - Avoid using QT prolonging medications such as Zofran  Cough Chest x-ray negative. -Follow-up COVID PCR -As needed Mucinex  Morbid obesity with BMI of 45.0-49.9, adult (HCC)  BMI= 47.49   and BW= 114 kg -Diet and exercise.   -Encourage to lose weight.         Subjective: Patient was seen and examined at bedside.  Patient still continues to complain of having nausea vomiting.  Also complains of having diffuse abdominal pain.  Physical Exam: Vitals:   09/11/22 1030 09/11/22 1200 09/11/22 1215 09/11/22 1231  BP: (!) 167/110 (!) 173/117  (!) 141/104  Pulse:    (!) 121  Resp: 17 (!) '23 20 14  ' Temp:    98.5 F (36.9 C)  TempSrc:    Oral  SpO2:    100%  Weight:      Height:       General: Not in acute distress HEENT:       Eyes: PERRL, EOMI, no scleral icterus.       ENT: No discharge from the ears and nose, no pharynx injection, no tonsillar enlargement.        Neck: No JVD, no bruit, no mass felt. Heme: No neck lymph node enlargement. Cardiac: S1/S2, RRR, No murmurs, No gallops or rubs. Respiratory: No rales, wheezing, rhonchi or rubs. GI: Soft, nondistended, has diffused tenderness with particular point tenderness in the epigastric region, no rebound pain, no organomegaly, BS present. GU: No hematuria Ext: No pitting leg edema bilaterally.  1+DP/PT pulse bilaterally. Musculoskeletal: No joint deformities, No joint redness or warmth, no limitation of ROM in spin. Skin: No rashes.  Neuro: Alert, oriented X3, cranial nerves II-XII grossly intact, moves all extremities normally. Psych: Patient is not psychotic, no suicidal or hemocidal ideation. Data Reviewed:  Review of the labs creatinine has improved from 1.5-1.1  Family Communication: Patient is alert and  oriented  Disposition: Status is: Observation   Planned Discharge Destination: Home    Time spent: 35 minutes  Author: Carlyle Lipa, MD 09/11/2022 12:53 PM  For on call review [email or web address].

## 2022-09-11 NOTE — ED Notes (Signed)
Neomia Glass, NP. Has been made aware of the pt's continued nausea and recent episode of vomiting. The provider has ordered Phenergan for the pt at this time. Currently awaiting verification by pharmacy, close monitoring continued.

## 2022-09-11 NOTE — Inpatient Diabetes Management (Signed)
Inpatient Diabetes Program Recommendations  AACE/ADA: New Consensus Statement on Inpatient Glycemic Control  Target Ranges:  Prepandial:   less than 140 mg/dL      Peak postprandial:   less than 180 mg/dL (1-2 hours)      Critically ill patients:  140 - 180 mg/dL    Latest Reference Range & Units 09/11/22 04:12  Glucose 70 - 99 mg/dL 177 (H)    Latest Reference Range & Units 09/10/22 11:00 09/10/22 17:07 09/10/22 21:13  Glucose-Capillary 70 - 99 mg/dL 310 (H) 223 (H) 204 (H)   Review of Glycemic Control  Diabetes history: DM2 Outpatient Diabetes medications: Lantus 75 units daily (not taking; reports her doctor stopped insulin 1 week ago when started on Tradjenta), Tradjenta 5 mg daily, Metformin 1000 mg BID Current orders for Inpatient glycemic control: Semglee 20 units BID, Novolog 0-9 units TID with meals, Novolog 0-5 units QHS   Inpatient Diabetes Program Recommendations:     Insulin: Please consider increasing Semglee to 22 units BID.  Outpatient DM medications: Patient reports that she was started on Tradjenta 5 mg daily 1 week ago and she was told to stop taking Lantus 75 units daily and glucose has been over 400 mg/dl since then at home. At discharge, please provide dose of Lantus that patient needs to resume (may want to discharge on same dose of Semglee ordered as inpatient).  Thanks, Barnie Alderman, RN, MSN, [place] Diabetes Coordinator Inpatient Diabetes Program [phone] (Team Pager from 8am to [place])

## 2022-09-11 NOTE — ED Notes (Signed)
S/P ambulation to the restroom, the pt became nauseous and had an episode of vomiting. The pt states having a continued feeling of nausea both prior to ambulating to the restroom and s/p her episode of vomiting. This RN, attempted to contact the provider with no response at this time. Close monitoring continued.

## 2022-09-12 ENCOUNTER — Inpatient Hospital Stay: Payer: Medicaid Other

## 2022-09-12 DIAGNOSIS — Z888 Allergy status to other drugs, medicaments and biological substances status: Secondary | ICD-10-CM | POA: Diagnosis not present

## 2022-09-12 DIAGNOSIS — E1165 Type 2 diabetes mellitus with hyperglycemia: Secondary | ICD-10-CM | POA: Diagnosis present

## 2022-09-12 DIAGNOSIS — Z8249 Family history of ischemic heart disease and other diseases of the circulatory system: Secondary | ICD-10-CM | POA: Diagnosis not present

## 2022-09-12 DIAGNOSIS — R9431 Abnormal electrocardiogram [ECG] [EKG]: Secondary | ICD-10-CM | POA: Diagnosis present

## 2022-09-12 DIAGNOSIS — Z7984 Long term (current) use of oral hypoglycemic drugs: Secondary | ICD-10-CM | POA: Diagnosis not present

## 2022-09-12 DIAGNOSIS — Z833 Family history of diabetes mellitus: Secondary | ICD-10-CM | POA: Diagnosis not present

## 2022-09-12 DIAGNOSIS — I129 Hypertensive chronic kidney disease with stage 1 through stage 4 chronic kidney disease, or unspecified chronic kidney disease: Secondary | ICD-10-CM | POA: Diagnosis present

## 2022-09-12 DIAGNOSIS — F419 Anxiety disorder, unspecified: Secondary | ICD-10-CM | POA: Diagnosis present

## 2022-09-12 DIAGNOSIS — R112 Nausea with vomiting, unspecified: Secondary | ICD-10-CM | POA: Diagnosis present

## 2022-09-12 DIAGNOSIS — N1831 Chronic kidney disease, stage 3a: Secondary | ICD-10-CM | POA: Diagnosis present

## 2022-09-12 DIAGNOSIS — Z882 Allergy status to sulfonamides status: Secondary | ICD-10-CM | POA: Diagnosis not present

## 2022-09-12 DIAGNOSIS — Z1152 Encounter for screening for COVID-19: Secondary | ICD-10-CM | POA: Diagnosis not present

## 2022-09-12 DIAGNOSIS — D72829 Elevated white blood cell count, unspecified: Secondary | ICD-10-CM | POA: Diagnosis present

## 2022-09-12 DIAGNOSIS — Z79899 Other long term (current) drug therapy: Secondary | ICD-10-CM | POA: Diagnosis not present

## 2022-09-12 DIAGNOSIS — D509 Iron deficiency anemia, unspecified: Secondary | ICD-10-CM | POA: Diagnosis present

## 2022-09-12 DIAGNOSIS — E860 Dehydration: Secondary | ICD-10-CM | POA: Diagnosis present

## 2022-09-12 DIAGNOSIS — R197 Diarrhea, unspecified: Secondary | ICD-10-CM

## 2022-09-12 DIAGNOSIS — R1013 Epigastric pain: Secondary | ICD-10-CM | POA: Diagnosis not present

## 2022-09-12 DIAGNOSIS — Z794 Long term (current) use of insulin: Secondary | ICD-10-CM | POA: Diagnosis not present

## 2022-09-12 DIAGNOSIS — F32A Depression, unspecified: Secondary | ICD-10-CM | POA: Diagnosis present

## 2022-09-12 DIAGNOSIS — N179 Acute kidney failure, unspecified: Secondary | ICD-10-CM | POA: Diagnosis present

## 2022-09-12 DIAGNOSIS — M109 Gout, unspecified: Secondary | ICD-10-CM | POA: Diagnosis present

## 2022-09-12 DIAGNOSIS — E1122 Type 2 diabetes mellitus with diabetic chronic kidney disease: Secondary | ICD-10-CM | POA: Diagnosis present

## 2022-09-12 DIAGNOSIS — Z6841 Body Mass Index (BMI) 40.0 and over, adult: Secondary | ICD-10-CM | POA: Diagnosis not present

## 2022-09-12 DIAGNOSIS — E876 Hypokalemia: Secondary | ICD-10-CM | POA: Diagnosis present

## 2022-09-12 LAB — CBC WITH DIFFERENTIAL/PLATELET
Abs Immature Granulocytes: 0.03 10*3/uL (ref 0.00–0.07)
Basophils Absolute: 0 10*3/uL (ref 0.0–0.1)
Basophils Relative: 0 %
Eosinophils Absolute: 0.2 10*3/uL (ref 0.0–0.5)
Eosinophils Relative: 3 %
HCT: 31.8 % — ABNORMAL LOW (ref 36.0–46.0)
Hemoglobin: 9.7 g/dL — ABNORMAL LOW (ref 12.0–15.0)
Immature Granulocytes: 0 %
Lymphocytes Relative: 25 %
Lymphs Abs: 2.1 10*3/uL (ref 0.7–4.0)
MCH: 25 pg — ABNORMAL LOW (ref 26.0–34.0)
MCHC: 30.5 g/dL (ref 30.0–36.0)
MCV: 82 fL (ref 80.0–100.0)
Monocytes Absolute: 0.8 10*3/uL (ref 0.1–1.0)
Monocytes Relative: 9 %
Neutro Abs: 5.2 10*3/uL (ref 1.7–7.7)
Neutrophils Relative %: 63 %
Platelets: 321 10*3/uL (ref 150–400)
RBC: 3.88 MIL/uL (ref 3.87–5.11)
RDW: 15.7 % — ABNORMAL HIGH (ref 11.5–15.5)
WBC: 8.3 10*3/uL (ref 4.0–10.5)
nRBC: 0 % (ref 0.0–0.2)

## 2022-09-12 LAB — COMPREHENSIVE METABOLIC PANEL
ALT: 13 U/L (ref 0–44)
AST: 13 U/L — ABNORMAL LOW (ref 15–41)
Albumin: 2.9 g/dL — ABNORMAL LOW (ref 3.5–5.0)
Alkaline Phosphatase: 86 U/L (ref 38–126)
Anion gap: 6 (ref 5–15)
BUN: 25 mg/dL — ABNORMAL HIGH (ref 6–20)
CO2: 23 mmol/L (ref 22–32)
Calcium: 8.7 mg/dL — ABNORMAL LOW (ref 8.9–10.3)
Chloride: 110 mmol/L (ref 98–111)
Creatinine, Ser: 1.19 mg/dL — ABNORMAL HIGH (ref 0.44–1.00)
GFR, Estimated: >60 mL/min (ref >60)
Glucose, Bld: 134 mg/dL — ABNORMAL HIGH (ref 70–99)
Potassium: 3.3 mmol/L — ABNORMAL LOW (ref 3.5–5.1)
Sodium: 139 mmol/L (ref 135–145)
Total Bilirubin: 0.6 mg/dL (ref 0.3–1.2)
Total Protein: 7 g/dL (ref 6.5–8.1)

## 2022-09-12 LAB — GLUCOSE, CAPILLARY
Glucose-Capillary: 151 mg/dL — ABNORMAL HIGH (ref 70–99)
Glucose-Capillary: 167 mg/dL — ABNORMAL HIGH (ref 70–99)
Glucose-Capillary: 138 mg/dL — ABNORMAL HIGH (ref 70–99)
Glucose-Capillary: 144 mg/dL — ABNORMAL HIGH (ref 70–99)

## 2022-09-12 MED ORDER — ACETAMINOPHEN 325 MG PO TABS: 650.0000 mg | ORAL_TABLET | Freq: Four times a day (QID) | ORAL | Status: DC | PRN

## 2022-09-12 MED ORDER — SENNOSIDES-DOCUSATE SODIUM 8.6-50 MG PO TABS
1.0000 | ORAL_TABLET | Freq: Every evening | ORAL | Status: DC | PRN
  Administered 2022-09-12: 1 via ORAL
  Filled 2022-09-12: qty 1

## 2022-09-12 MED ORDER — GUAIFENESIN 100 MG/5ML PO LIQD: 5.0000 mL | ORAL | Status: DC | PRN

## 2022-09-12 MED ORDER — TRAZODONE HCL 50 MG PO TABS
50.0000 mg | ORAL_TABLET | Freq: Every evening | ORAL | Status: DC | PRN
  Administered 2022-09-12 – 2022-09-13 (×2): 50 mg via ORAL
  Filled 2022-09-12 (×2): qty 1

## 2022-09-12 MED ORDER — POTASSIUM CHLORIDE CRYS ER 20 MEQ PO TBCR
40.0000 meq | EXTENDED_RELEASE_TABLET | Freq: Once | ORAL | Status: AC
  Administered 2022-09-12: 40 meq via ORAL
  Filled 2022-09-12: qty 2

## 2022-09-12 MED ORDER — METOPROLOL TARTRATE 5 MG/5ML IV SOLN
5.0000 mg | INTRAVENOUS | Status: DC | PRN
  Administered 2022-09-12: 5 mg via INTRAVENOUS
  Filled 2022-09-12: qty 5

## 2022-09-12 MED ORDER — IPRATROPIUM-ALBUTEROL 0.5-2.5 (3) MG/3ML IN SOLN: 3.0000 mL | RESPIRATORY_TRACT | Status: DC | PRN

## 2022-09-12 MED ORDER — SODIUM CHLORIDE 0.9 % IV SOLN: INTRAVENOUS | Status: DC

## 2022-09-12 MED ORDER — SODIUM CHLORIDE 0.9 % IV SOLN
12.5000 mg | Freq: Four times a day (QID) | INTRAVENOUS | Status: DC | PRN
  Administered 2022-09-13: 12.5 mg via INTRAVENOUS
  Filled 2022-09-12: qty 12.5

## 2022-09-12 MED ORDER — POLYETHYLENE GLYCOL 3350 17 G PO PACK
17.0000 g | PACK | Freq: Every day | ORAL | Status: DC | PRN
  Administered 2022-09-12: 17 g via ORAL
  Filled 2022-09-12: qty 1

## 2022-09-12 MED ORDER — OXYCODONE HCL 5 MG PO TABS
5.0000 mg | ORAL_TABLET | ORAL | Status: DC | PRN
  Administered 2022-09-13 – 2022-09-14 (×3): 5 mg via ORAL
  Filled 2022-09-12 (×3): qty 1

## 2022-09-12 MED ORDER — HYDRALAZINE HCL 20 MG/ML IJ SOLN: 10.0000 mg | INTRAMUSCULAR | Status: DC | PRN

## 2022-09-12 NOTE — Progress Notes (Signed)
Provider Notification: Gerlean Ren MD   Pt is c/o of continues abdominal pain and nausea. Pt received IV Reglan but pt states it does not feel like it has helped. Do you want to order an Xray of the abdomen to ruled out an Ileus?   Orders received: KUB and IV phenergan were ordered.

## 2022-09-12 NOTE — Progress Notes (Signed)
Provider Notification: Gerlean Ren MD  Pt has not have a BM since last Friday. Can we please have miralax ordered.   Orders received: Prn Miralax was ordered.

## 2022-09-12 NOTE — Progress Notes (Signed)
PROGRESS NOTE    Theresa Malone  PZW:[id number] DOB: 1981-04-27 DOA: 09/10/2022 PCP: Josephine Cables, MD   Brief Narrative:  41 year old with history of HTN, DM 2, depression/anxiety, morbid obesity with BMI greater than 45, anemia, CKD stage IIIa admitted for nausea, vomiting, diarrhea and abdominal pain.  She was recently hospitalized for similar reason 10/13 - 10/15.  UDS and CT abdomen pelvis were negative at that time.  Lipase normal.  UA was suggestive of possible UTI but mostly remains asymptomatic.   Assessment & Plan:  Principal Problem:   Nausea vomiting and diarrhea Active Problems:   Acute renal failure superimposed on stage 3a chronic kidney disease (HCC)   Microcytic anemia   Leukocytosis   Type II diabetes mellitus with renal manifestations (HCC)   Essential hypertension   Elevated lipase   Depression with anxiety   Prolonged QT interval   Cough   Morbid obesity with BMI of 45.0-49.9, adult (HCC)   Nausea vomiting and diarrhea Etiology is not clear.  Patient had a negative CT scan of the abdomen/pelvis on 09/07/2022.  UDS was negative in that admission.  GI panel/C. difficile currently pending collection.  LFTs, lipase normal - Antiemetics as needed.  Supportive care.  PPI. Clear liquid diet   Acute renal failure superimposed on stage 3a chronic kidney disease ([place]), resolved Baseline creatinine 1.29 on 09/08/22.  Creatinine peaked at 1.55.  Today 1.19.  Likely due to dehydration and continuation of Cozaar.  Urinalysis showed cloudy appearance, many bacteria, WBC 10-20, but has squamous cell contamination.  Patient denies symptoms of UTI. -Cozaar on hold, getting IV fluids.   Microcytic anemia Hemoglobin slightly drifted down to 9.7 along with other cell lines.  Likely dilutional.   Leukocytosis Resolved.   Type II diabetes mellitus with renal manifestations (HCC) Recent A1c 12.3.  Patient is taking Amaryl and Lantus at home. Currently on Semglee 20 units  twice daily, sliding scale and Accu-Cheks   Essential hypertension -Cozaar on hold.  Amlodipine 5 mg daily started.  IV as needed ordered   Elevated lipase Lipase is 69, repeat lipase 75.  Does not meet criteria for pancreatitis at this time.  CT abdomen pelvis is also negative.   Depression with anxiety - Hold home Lexapro and hydroxyzine due to QTc prolonged patient   Prolonged QT interval; 502 - Avoid using QT prolonging medications such as Zofran   Cough Chest x-ray negative. -Follow-up COVID PCR- neg   Morbid obesity with BMI of 45.0-49.9, adult (HCC)   BMI= 47.49   and BW= 114 kg -Diet and exercise.   -Encourage to lose weight.    Hypokalemia Replete prn    DVT prophylaxis: Place and maintain sequential compression device Start: 09/10/22 1318 Code Status: Full Code Family Communication:  Called her husband; no answer. Left a vm.   Patient is still quite nauseous requiring antiemetics.  Slowly advance diet as tolerated  Subjective: Seen and examined at bedside, feels slightly better compared to yesterday but still nauseous having difficulty tolerating p.o. Tells me this started last week after she had some leftover [place] food with a warming up.   Examination:  General exam: Appears calm and comfortable  Respiratory system: Clear to auscultation. Respiratory effort normal. Cardiovascular system: S1 & S2 heard, RRR. No JVD, murmurs, rubs, gallops or clicks. No pedal edema. Gastrointestinal system: Abdomen is nondistended, soft and nontender. No organomegaly or masses felt. Normal bowel sounds heard. Central nervous system: Alert and oriented. No focal neurological deficits. Extremities: Symmetric  5 x 5 power. Skin: No rashes, lesions or ulcers Psychiatry: Judgement and insight appear normal. Mood & affect appropriate.     Objective: Vitals:   09/11/22 1446 09/11/22 1941 09/12/22 0407 09/12/22 0812  BP: 132/88 (!) 133/96 (!) 152/99 (!) 149/101  Pulse: (!)  123 (!) 116 (!) 114 (!) 113  Resp: 14 18 18 16   Temp: 98 F (36.7 C) 98.7 F (37.1 C) 98.3 F (36.8 C) 98.8 F (37.1 C)  TempSrc: Oral   Oral  SpO2: 98% 100% 100% 98%  Weight:      Height:        Intake/Output Summary (Last 24 hours) at 09/12/2022 0839 Last data filed at 09/12/2022 0423 Gross per 24 hour  Intake 3169.12 ml  Output 1 ml  Net 3168.12 ml   Filed Weights   09/10/22 0139  Weight: 114 kg     Data Reviewed:   CBC: Recent Labs  Lab 09/06/22 2240 09/08/22 0649 09/09/22 0838 09/10/22 0153 09/11/22 0412 09/12/22 0548  WBC 10.4 11.1* 9.2 12.1* 10.5 8.3  NEUTROABS 8.3*  --  6.6 8.0*  --  5.2  HGB 10.2* 10.3* 10.8* 11.2* 10.4* 9.7*  HCT 32.9* 31.8* 34.5* 36.0 33.9* 31.8*  MCV 79.5* 77.6* 78.6* 79.8* 82.1 82.0  PLT 531* 527* 453* 662* 443* 321   Basic Metabolic Panel: Recent Labs  Lab 09/08/22 0649 09/09/22 0838 09/10/22 0153 09/11/22 0412 09/12/22 0548  NA 135 133* 131* 137 139  K 3.3* 3.4* 3.6 3.6 3.3*  CL 102 102 101 109 110  CO2 21* 22 18* 22 23  GLUCOSE 238* 231* 342* 177* 134*  BUN 28* 25* 33* 27* 25*  CREATININE 1.29* 1.19* 1.55* 1.11* 1.19*  CALCIUM 9.5 9.3 9.7 9.0 8.7*  MG 2.0 2.0  --   --   --   PHOS  --  3.8  --   --   --    GFR: Estimated Creatinine Clearance: 75.2 mL/min (A) (by C-G formula based on SCr of 1.19 mg/dL (H)). Liver Function Tests: Recent Labs  Lab 09/06/22 2240 09/10/22 0153 09/12/22 0548  AST 19 16 13*  ALT 14 14 13   ALKPHOS 110 104 86  BILITOT 0.8 1.3* 0.6  PROT 9.9* 8.8* 7.0  ALBUMIN 3.7 3.6 2.9*   Recent Labs  Lab 09/10/22 0153 09/11/22 0412  LIPASE 69* 75*   No results for input(s): "AMMONIA" in the last 168 hours. Coagulation Profile: No results for input(s): "INR", "PROTIME" in the last 168 hours. Cardiac Enzymes: No results for input(s): "CKTOTAL", "CKMB", "CKMBINDEX", "TROPONINI" in the last 168 hours. BNP (last 3 results) No results for input(s): "PROBNP" in the last 8760  hours. HbA1C: No results for input(s): "HGBA1C" in the last 72 hours. CBG: Recent Labs  Lab 09/10/22 2113 09/11/22 1021 09/11/22 1246 09/11/22 1743 09/11/22 2023  GLUCAP 204* 186* 180* 129* 169*   Lipid Profile: No results for input(s): "CHOL", "HDL", "LDLCALC", "TRIG", "CHOLHDL", "LDLDIRECT" in the last 72 hours. Thyroid Function Tests: No results for input(s): "TSH", "T4TOTAL", "FREET4", "T3FREE", "THYROIDAB" in the last 72 hours. Anemia Panel: No results for input(s): "VITAMINB12", "FOLATE", "FERRITIN", "TIBC", "IRON", "RETICCTPCT" in the last 72 hours. Sepsis Labs: No results for input(s): "PROCALCITON", "LATICACIDVEN" in the last 168 hours.  Recent Results (from the past 240 hour(s))  Urine Culture     Status: Abnormal   Collection Time: 09/10/22  8:24 AM   Specimen: Urine, Clean Catch  Result Value Ref Range Status   Specimen Description  Final    URINE, CLEAN CATCH Performed at [facility], [address] [place], [place] [address]    Special Requests   Final    NONE Performed at [facility], [address] [place], [place] [address]    Culture MULTIPLE SPECIES PRESENT, SUGGEST RECOLLECTION (A)  Final   Report Status 09/11/2022 FINAL  Final  SARS Coronavirus 2 by RT PCR (hospital order, performed in [facility] hospital lab) *cepheid single result test* Urine, Clean Catch     Status: None   Collection Time: 09/11/22  4:12 AM   Specimen: Urine, Clean Catch; Nasal Swab  Result Value Ref Range Status   SARS Coronavirus 2 by RT PCR NEGATIVE NEGATIVE Final    Comment: (NOTE) SARS-CoV-2 target nucleic acids are NOT DETECTED.  The SARS-CoV-2 RNA is generally detectable in upper and lower respiratory specimens during the acute phase of infection. The lowest concentration of SARS-CoV-2 viral copies this assay can detect is 250 copies / mL. A negative result does not preclude SARS-CoV-2 infection and should not be used as the sole basis for  treatment or other patient management decisions.  A negative result may occur with improper specimen collection / handling, submission of specimen other than nasopharyngeal swab, presence of viral mutation(s) within the areas targeted by this assay, and inadequate number of viral copies (<250 copies / mL). A negative result must be combined with clinical observations, patient history, and epidemiological information.  Fact Sheet for Patients:   RoadLapTop.co.za  Fact Sheet for Healthcare Providers: http://kim-miller.com/  This test is not yet approved or  cleared by the [place] FDA and has been authorized for detection and/or diagnosis of SARS-CoV-2 by FDA under an Emergency Use Authorization (EUA).  This EUA will remain in effect (meaning this test can be used) for the duration of the COVID-19 declaration under Section 564(b)(1) of the Act, 21 U.S.C. section 360bbb-3(b)(1), unless the authorization is terminated or revoked sooner.  Performed at [facility], [address] [place], [place] [address]          Radiology Studies: No results found.      Scheduled Meds:  amLODipine  5 mg Oral Daily   insulin aspart  0-5 Units Subcutaneous QHS   insulin aspart  0-9 Units Subcutaneous TID WC   insulin glargine-yfgn  20 Units Subcutaneous BID   pantoprazole (PROTONIX) IV  40 mg Intravenous Q12H   Continuous Infusions:  sodium chloride 100 mL/hr at 09/12/22 0423     LOS: 0 days   Time spent= 35 mins    Nyla Creason Joline Maxcy, MD [facility]  If 7PM-7AM, please contact night-coverage  09/12/2022, 8:39 AM

## 2022-09-13 DIAGNOSIS — R197 Diarrhea, unspecified: Secondary | ICD-10-CM | POA: Diagnosis not present

## 2022-09-13 DIAGNOSIS — R112 Nausea with vomiting, unspecified: Secondary | ICD-10-CM | POA: Diagnosis not present

## 2022-09-13 LAB — CBC
HCT: 27.9 % — ABNORMAL LOW (ref 36.0–46.0)
Hemoglobin: 8.7 g/dL — ABNORMAL LOW (ref 12.0–15.0)
MCH: 25.2 pg — ABNORMAL LOW (ref 26.0–34.0)
MCHC: 31.2 g/dL (ref 30.0–36.0)
MCV: 80.9 fL (ref 80.0–100.0)
Platelets: 304 10*3/uL (ref 150–400)
RBC: 3.45 MIL/uL — ABNORMAL LOW (ref 3.87–5.11)
RDW: 15.5 % (ref 11.5–15.5)
WBC: 8.1 10*3/uL (ref 4.0–10.5)
nRBC: 0 % (ref 0.0–0.2)

## 2022-09-13 LAB — COMPREHENSIVE METABOLIC PANEL
ALT: 14 U/L (ref 0–44)
AST: 15 U/L (ref 15–41)
Albumin: 2.8 g/dL — ABNORMAL LOW (ref 3.5–5.0)
Alkaline Phosphatase: 97 U/L (ref 38–126)
Anion gap: 3 — ABNORMAL LOW (ref 5–15)
BUN: 15 mg/dL (ref 6–20)
CO2: 23 mmol/L (ref 22–32)
Calcium: 8.3 mg/dL — ABNORMAL LOW (ref 8.9–10.3)
Chloride: 110 mmol/L (ref 98–111)
Creatinine, Ser: 1.14 mg/dL — ABNORMAL HIGH (ref 0.44–1.00)
GFR, Estimated: >60 mL/min (ref >60)
Glucose, Bld: 132 mg/dL — ABNORMAL HIGH (ref 70–99)
Potassium: 3.3 mmol/L — ABNORMAL LOW (ref 3.5–5.1)
Sodium: 136 mmol/L (ref 135–145)
Total Bilirubin: 0.9 mg/dL (ref 0.3–1.2)
Total Protein: 6.9 g/dL (ref 6.5–8.1)

## 2022-09-13 LAB — GLUCOSE, RANDOM: Glucose, Bld: 463 mg/dL — ABNORMAL HIGH (ref 70–99)

## 2022-09-13 LAB — GLUCOSE, CAPILLARY
Glucose-Capillary: 102 mg/dL — ABNORMAL HIGH (ref 70–99)
Glucose-Capillary: 314 mg/dL — ABNORMAL HIGH (ref 70–99)
Glucose-Capillary: 226 mg/dL — ABNORMAL HIGH (ref 70–99)
Glucose-Capillary: 428 mg/dL — ABNORMAL HIGH (ref 70–99)

## 2022-09-13 LAB — MAGNESIUM: Magnesium: 1.7 mg/dL (ref 1.7–2.4)

## 2022-09-13 MED ORDER — MAGNESIUM SULFATE 2 GM/50ML IV SOLN
2.0000 g | Freq: Once | INTRAVENOUS | Status: AC
  Administered 2022-09-13: 2 g via INTRAVENOUS
  Filled 2022-09-13: qty 50

## 2022-09-13 MED ORDER — POTASSIUM CHLORIDE 10 MEQ/100ML IV SOLN: 10.0000 meq | INTRAVENOUS | Status: DC

## 2022-09-13 MED ORDER — INSULIN ASPART 100 UNIT/ML IJ SOLN
10.0000 [IU] | Freq: Once | INTRAMUSCULAR | Status: AC
  Administered 2022-09-13: 10 [IU] via SUBCUTANEOUS
  Filled 2022-09-13: qty 1

## 2022-09-13 MED ORDER — PANTOPRAZOLE SODIUM 40 MG PO TBEC
40.0000 mg | DELAYED_RELEASE_TABLET | Freq: Two times a day (BID) | ORAL | Status: DC
  Administered 2022-09-13 – 2022-09-14 (×2): 40 mg via ORAL
  Filled 2022-09-13 (×2): qty 1

## 2022-09-13 MED ORDER — POTASSIUM CHLORIDE 10 MEQ/100ML IV SOLN
10.0000 meq | INTRAVENOUS | Status: AC
  Administered 2022-09-13: 10 meq via INTRAVENOUS
  Filled 2022-09-13 (×3): qty 100

## 2022-09-13 MED ORDER — POTASSIUM CHLORIDE CRYS ER 20 MEQ PO TBCR
40.0000 meq | EXTENDED_RELEASE_TABLET | Freq: Once | ORAL | Status: AC
  Administered 2022-09-13: 40 meq via ORAL
  Filled 2022-09-13: qty 2

## 2022-09-13 MED ORDER — POTASSIUM CHLORIDE 10 MEQ/100ML IV SOLN
10.0000 meq | INTRAVENOUS | Status: AC
  Administered 2022-09-13 (×2): 10 meq via INTRAVENOUS

## 2022-09-13 NOTE — Progress Notes (Signed)
Dr. Reesa Chew made aware that patient will not have and IV until the the IV team is back on campus at 1900 on 10/19. IV medication orders have been rescheduled until then.

## 2022-09-13 NOTE — Progress Notes (Signed)
Spoke with primary RN,regarding PIV consult. Informed RN that IVT will not be on campus until 1900 on 10/19. Recommended RN to reach out to fellow colleagues for any current access needs.

## 2022-09-13 NOTE — Progress Notes (Signed)
PROGRESS NOTE    Theresa Malone  SWN:[id number] DOB: August 23, 1981 DOA: 09/10/2022 PCP: Ethelda Chick, MD   Brief Narrative:  41 year old with history of HTN, DM 2, depression/anxiety, morbid obesity with BMI greater than 45, anemia, CKD stage IIIa admitted for nausea, vomiting, diarrhea and abdominal pain.  She was recently hospitalized for similar reason 10/13 - 10/15.  UDS and CT abdomen pelvis were negative at that time.  Lipase normal.  UA was suggestive of possible UTI but mostly remains asymptomatic.   Assessment & Plan:  Principal Problem:   Nausea vomiting and diarrhea Active Problems:   Acute renal failure superimposed on stage 3a chronic kidney disease (HCC)   Microcytic anemia   Leukocytosis   Type II diabetes mellitus with renal manifestations (HCC)   Essential hypertension   Elevated lipase   Depression with anxiety   Prolonged QT interval   Cough   Morbid obesity with BMI of 45.0-49.9, adult (HCC)   Intractable nausea and vomiting   Nausea vomiting and diarrhea Etiology is not clear.  Patient had a negative CT scan of the abdomen/pelvis on 09/07/2022.  UDS was negative in that admission.  GI panel/C. difficile currently pending collection.  LFTs, lipase normal - Antiemetics as needed.  Supportive care.  PPI PO BID.  We will advance her diet to soft as she developed full liquid this morning.   Acute renal failure superimposed on stage 3a chronic kidney disease (HCC), resolved Baseline creatinine 1.29 on 09/08/22.  Creatinine peaked at 1.55.  Today 1.14.  Likely due to dehydration and continuation of Cozaar.  Urinalysis showed cloudy appearance, many bacteria, WBC 10-20, but has squamous cell contamination.  Patient denies symptoms of UTI. Getting IV fluids as needed   Microcytic anemia Overall hemoglobin appears to be stable around 9.0   Leukocytosis Resolved.   Type II diabetes mellitus with renal manifestations (HCC), blood glucose stable Recent A1c 12.3.   Patient is taking Amaryl and Lantus at home. Currently on Semglee 20 units twice daily, sliding scale and Accu-Cheks   Essential hypertension -Cozaar on hold.  Amlodipine 5 mg daily started.  IV as needed ordered   Elevated lipase Lipase is 69, repeat lipase 75.  Does not meet criteria for pancreatitis at this time.  CT abdomen pelvis is also negative.   Depression with anxiety - Hold home Lexapro and hydroxyzine due to QTc prolonged patient   Prolonged QT interval; 502 - Avoid using QT prolonging medications such as Zofran   Cough Chest x-ray negative. -Follow-up COVID PCR- neg   Morbid obesity with BMI of 45.0-49.9, adult (HCC)   BMI= 47.49   and BW= 114 kg -Diet and exercise.   -Encourage to lose weight.    Hypokalemia Replete prn    DVT prophylaxis: Place and maintain sequential compression device Start: 09/10/22 1318 Code Status: Full Code Family Communication:  [employer].   Nausea is little better, will slowly advance her diet today.  Hopefully if she can tolerate this over next 24 hours she should be able to go home tomorrow  Subjective: Less nausea this morning with full liquid diet this morning.  She would like to trial a bit more later today.  Examination: Constitutional: Not in acute distress Respiratory: Clear to auscultation bilaterally Cardiovascular: Normal sinus rhythm, no rubs Abdomen: Nontender nondistended good bowel sounds Musculoskeletal: No edema noted Skin: No rashes seen Neurologic: CN 2-12 grossly intact.  And nonfocal Psychiatric: Normal judgment and insight. Alert and oriented x 3. Normal mood.  Objective: Vitals:   09/12/22 1555 09/12/22 2000 09/13/22 0357 09/13/22 0806  BP: (!) 158/90 (!) 144/101 (!) 156/91 122/80  Pulse: 97 94 99 (!) 107  Resp: 16 18 20 18   Temp: 98.4 F (36.9 C) 98.7 F (37.1 C) 98.8 F (37.1 C) 98.7 F (37.1 C)  TempSrc: Oral  Oral Oral  SpO2: 100% 100% 100% 100%  Weight:      Height:         Intake/Output Summary (Last 24 hours) at 09/13/2022 1102 Last data filed at 09/13/2022 0516 Gross per 24 hour  Intake 2109.36 ml  Output --  Net 2109.36 ml   Filed Weights   09/10/22 0139  Weight: 114 kg     Data Reviewed:   CBC: Recent Labs  Lab 09/06/22 2240 09/08/22 0649 09/09/22 0838 09/10/22 0153 09/11/22 0412 09/12/22 0548 09/13/22 0343  WBC 10.4   < > 9.2 12.1* 10.5 8.3 8.1  NEUTROABS 8.3*  --  6.6 8.0*  --  5.2  --   HGB 10.2*   < > 10.8* 11.2* 10.4* 9.7* 8.7*  HCT 32.9*   < > 34.5* 36.0 33.9* 31.8* 27.9*  MCV 79.5*   < > 78.6* 79.8* 82.1 82.0 80.9  PLT 531*   < > 453* 662* 443* 321 304   < > = values in this interval not displayed.   Basic Metabolic Panel: Recent Labs  Lab 09/08/22 0649 09/09/22 0838 09/10/22 0153 09/11/22 0412 09/12/22 0548 09/13/22 0343  NA 135 133* 131* 137 139 136  K 3.3* 3.4* 3.6 3.6 3.3* 3.3*  CL 102 102 101 109 110 110  CO2 21* 22 18* 22 23 23   GLUCOSE 238* 231* 342* 177* 134* 132*  BUN 28* 25* 33* 27* 25* 15  CREATININE 1.29* 1.19* 1.55* 1.11* 1.19* 1.14*  CALCIUM 9.5 9.3 9.7 9.0 8.7* 8.3*  MG 2.0 2.0  --   --   --  1.7  PHOS  --  3.8  --   --   --   --    GFR: Estimated Creatinine Clearance: 78.5 mL/min (A) (by C-G formula based on SCr of 1.14 mg/dL (H)). Liver Function Tests: Recent Labs  Lab 09/06/22 2240 09/10/22 0153 09/12/22 0548 09/13/22 0343  AST 19 16 13* 15  ALT 14 14 13 14   ALKPHOS 110 104 86 97  BILITOT 0.8 1.3* 0.6 0.9  PROT 9.9* 8.8* 7.0 6.9  ALBUMIN 3.7 3.6 2.9* 2.8*   Recent Labs  Lab 09/10/22 0153 09/11/22 0412  LIPASE 69* 75*   No results for input(s): "AMMONIA" in the last 168 hours. Coagulation Profile: No results for input(s): "INR", "PROTIME" in the last 168 hours. Cardiac Enzymes: No results for input(s): "CKTOTAL", "CKMB", "CKMBINDEX", "TROPONINI" in the last 168 hours. BNP (last 3 results) No results for input(s): "PROBNP" in the last 8760 hours. HbA1C: No results for  input(s): "HGBA1C" in the last 72 hours. CBG: Recent Labs  Lab 09/12/22 0843 09/12/22 1212 09/12/22 1627 09/12/22 2030 09/13/22 0811  GLUCAP 151* 167* 138* 144* 102*   Lipid Profile: No results for input(s): "CHOL", "HDL", "LDLCALC", "TRIG", "CHOLHDL", "LDLDIRECT" in the last 72 hours. Thyroid Function Tests: No results for input(s): "TSH", "T4TOTAL", "FREET4", "T3FREE", "THYROIDAB" in the last 72 hours. Anemia Panel: No results for input(s): "VITAMINB12", "FOLATE", "FERRITIN", "TIBC", "IRON", "RETICCTPCT" in the last 72 hours. Sepsis Labs: No results for input(s): "PROCALCITON", "LATICACIDVEN" in the last 168 hours.  Recent Results (from the past 240 hour(s))  Urine Culture  Status: Abnormal   Collection Time: 09/10/22  8:24 AM   Specimen: Urine, Clean Catch  Result Value Ref Range Status   Specimen Description   Final    URINE, CLEAN CATCH Performed at [facility], [address], [place], [place] [address]    Special Requests   Final    NONE Performed at [facility], [address] [place], [place] [address]    Culture MULTIPLE SPECIES PRESENT, SUGGEST RECOLLECTION (A)  Final   Report Status 09/11/2022 FINAL  Final  SARS Coronavirus 2 by RT PCR (hospital order, performed in [facility] hospital lab) *cepheid single result test* Urine, Clean Catch     Status: None   Collection Time: 09/11/22  4:12 AM   Specimen: Urine, Clean Catch; Nasal Swab  Result Value Ref Range Status   SARS Coronavirus 2 by RT PCR NEGATIVE NEGATIVE Final    Comment: (NOTE) SARS-CoV-2 target nucleic acids are NOT DETECTED.  The SARS-CoV-2 RNA is generally detectable in upper and lower respiratory specimens during the acute phase of infection. The lowest concentration of SARS-CoV-2 viral copies this assay can detect is 250 copies / mL. A negative result does not preclude SARS-CoV-2 infection and should not be used as the sole basis for treatment or other patient  management decisions.  A negative result may occur with improper specimen collection / handling, submission of specimen other than nasopharyngeal swab, presence of viral mutation(s) within the areas targeted by this assay, and inadequate number of viral copies (<250 copies / mL). A negative result must be combined with clinical observations, patient history, and epidemiological information.  Fact Sheet for Patients:   https://www.patel.info/  Fact Sheet for Healthcare Providers: https://hall.com/  This test is not yet approved or  cleared by the [place] FDA and has been authorized for detection and/or diagnosis of SARS-CoV-2 by FDA under an Emergency Use Authorization (EUA).  This EUA will remain in effect (meaning this test can be used) for the duration of the COVID-19 declaration under Section 564(b)(1) of the Act, 21 U.S.C. section 360bbb-3(b)(1), unless the authorization is terminated or revoked sooner.  Performed at [facility], [address] [place], [place] [address]          Radiology Studies: DG Abd 1 View  Result Date: 09/12/2022 CLINICAL DATA:  Nausea and vomiting EXAM: ABDOMEN - 1 VIEW COMPARISON:  CT done on 09/07/2022 FINDINGS: Bowel gas pattern is nonspecific. Small amount of stool is seen in colon. No abnormal masses or calcifications are seen. Cardiac size is increased. Visualized lower lung fields are clear. IMPRESSION: No radiographic abnormalities are seen in abdomen. Electronically Signed   By: Elmer Picker M.D.   On: 09/12/2022 16:41        Scheduled Meds:  amLODipine  5 mg Oral Daily   insulin aspart  0-5 Units Subcutaneous QHS   insulin aspart  0-9 Units Subcutaneous TID WC   insulin glargine-yfgn  20 Units Subcutaneous BID   pantoprazole (PROTONIX) IV  40 mg Intravenous Q12H   Continuous Infusions:  sodium chloride 100 mL/hr at 09/13/22 0516   potassium chloride 10 mEq  (09/13/22 1054)   promethazine (PHENERGAN) injection (IM or IVPB) Stopped (09/13/22 0220)     LOS: 1 day   Time spent= 35 mins    Tamiya Colello Arsenio Loader, MD [facility]  If 7PM-7AM, please contact night-coverage  09/13/2022, 11:02 AM

## 2022-09-13 NOTE — Inpatient Diabetes Management (Signed)
Inpatient Diabetes Program Recommendations  AACE/ADA: New Consensus Statement on Inpatient Glycemic Control   Target Ranges:  Prepandial:   less than 140 mg/dL      Peak postprandial:   less than 180 mg/dL (1-2 hours)      Critically ill patients:  140 - 180 mg/dL    Latest Reference Range & Units 09/12/22 08:43 09/12/22 12:12 09/12/22 16:27 09/12/22 20:30 09/13/22 08:11 09/13/22 12:06  Glucose-Capillary 70 - 99 mg/dL 151 (H) 167 (H) 138 (H) 144 (H) 102 (H) 314 (H)   Review of Glycemic Control  Diabetes history: DM2 Outpatient Diabetes medications: Lantus 75 units daily (not taking; reports her doctor stopped insulin 1 week ago when started on Tradjenta), Tradjenta 5 mg daily, Metformin 1000 mg BID Current orders for Inpatient glycemic control: Semglee 20 units BID, Novolog 0-9 units TID with meals, Novolog 0-5 units QHS   Inpatient Diabetes Program Recommendations:     Insulin:  If CBGs continue to be consistently over 180 mg/dl and patient tolerating diet well, please consider ordering Novolog 3 units TID with meals for meal coverage if patient eats at least 50% of meals.    Outpatient DM medications: Patient reports that she was started on Tradjenta 5 mg daily 1 week ago and she was told to stop taking Lantus 75 units daily and glucose has been over 400 mg/dl since then at home. At discharge, please provide dose of Lantus that patient needs to resume (may want to discharge on same dose of Semglee ordered as inpatient).   Thanks, Barnie Alderman, RN, MSN, [place] Diabetes Coordinator Inpatient Diabetes Program [phone] (Team Pager from 8am to [place])

## 2022-09-14 DIAGNOSIS — R112 Nausea with vomiting, unspecified: Secondary | ICD-10-CM | POA: Diagnosis not present

## 2022-09-14 DIAGNOSIS — R197 Diarrhea, unspecified: Secondary | ICD-10-CM | POA: Diagnosis not present

## 2022-09-14 LAB — COMPREHENSIVE METABOLIC PANEL
ALT: 28 U/L (ref 0–44)
AST: 27 U/L (ref 15–41)
Albumin: 2.6 g/dL — ABNORMAL LOW (ref 3.5–5.0)
Alkaline Phosphatase: 115 U/L (ref 38–126)
Anion gap: 5 (ref 5–15)
BUN: 13 mg/dL (ref 6–20)
CO2: 22 mmol/L (ref 22–32)
Calcium: 7.9 mg/dL — ABNORMAL LOW (ref 8.9–10.3)
Chloride: 107 mmol/L (ref 98–111)
Creatinine, Ser: 1.17 mg/dL — ABNORMAL HIGH (ref 0.44–1.00)
GFR, Estimated: >60 mL/min (ref >60)
Glucose, Bld: 103 mg/dL — ABNORMAL HIGH (ref 70–99)
Potassium: 3.6 mmol/L (ref 3.5–5.1)
Sodium: 134 mmol/L — ABNORMAL LOW (ref 135–145)
Total Bilirubin: 0.6 mg/dL (ref 0.3–1.2)
Total Protein: 6.3 g/dL — ABNORMAL LOW (ref 6.5–8.1)

## 2022-09-14 LAB — GLUCOSE, CAPILLARY: Glucose-Capillary: 96 mg/dL (ref 70–99)

## 2022-09-14 LAB — CBC
HCT: 25.4 % — ABNORMAL LOW (ref 36.0–46.0)
Hemoglobin: 7.8 g/dL — ABNORMAL LOW (ref 12.0–15.0)
MCH: 25.2 pg — ABNORMAL LOW (ref 26.0–34.0)
MCHC: 30.7 g/dL (ref 30.0–36.0)
MCV: 81.9 fL (ref 80.0–100.0)
Platelets: 264 10*3/uL (ref 150–400)
RBC: 3.1 MIL/uL — ABNORMAL LOW (ref 3.87–5.11)
RDW: 15.6 % — ABNORMAL HIGH (ref 11.5–15.5)
WBC: 7.1 10*3/uL (ref 4.0–10.5)
nRBC: 0 % (ref 0.0–0.2)

## 2022-09-14 LAB — MAGNESIUM: Magnesium: 1.9 mg/dL (ref 1.7–2.4)

## 2022-09-14 MED ORDER — INSULIN GLARGINE-YFGN 100 UNIT/ML ~~LOC~~ SOLN
30.0000 [IU] | Freq: Two times a day (BID) | SUBCUTANEOUS | Status: DC
  Administered 2022-09-14: 30 [IU] via SUBCUTANEOUS
  Filled 2022-09-14: qty 0.3

## 2022-09-14 MED ORDER — NOVOLOG FLEXPEN 100 UNIT/ML ~~LOC~~ SOPN: PEN_INJECTOR | SUBCUTANEOUS | 11 refills | Status: DC

## 2022-09-14 MED ORDER — METOCLOPRAMIDE HCL 10 MG PO TABS: 10.0000 mg | ORAL_TABLET | Freq: Three times a day (TID) | ORAL | 0 refills | Status: DC | PRN

## 2022-09-14 MED ORDER — PANTOPRAZOLE SODIUM 40 MG PO TBEC: 40.0000 mg | DELAYED_RELEASE_TABLET | Freq: Two times a day (BID) | ORAL | 0 refills | Status: DC

## 2022-09-14 MED ORDER — NOVOLOG 70/30 FLEXPEN RELION (70-30) 100 UNIT/ML ~~LOC~~ SUPN: PEN_INJECTOR | SUBCUTANEOUS | 1 refills | Status: DC

## 2022-09-14 MED ORDER — AMLODIPINE BESYLATE 5 MG PO TABS: 5.0000 mg | ORAL_TABLET | Freq: Every day | ORAL | 0 refills | Status: DC

## 2022-09-14 NOTE — Discharge Summary (Addendum)
Physician Discharge Summary  Theresa Malone TDV:[id number] DOB: Dec 15, 1980 DOA: 09/10/2022  PCP: Josephine Cables, MD  Admit date: 09/10/2022 Discharge date: 09/14/2022  Admitted From: Home Disposition: Home  Recommendations for Outpatient Follow-up:  Follow up with PCP in 1-2 weeks Please obtain BMP/CBC in one week your next doctors visit.  Continue home insulin regimen.  I have additionally prescribed her NovoLog FlexPen for sliding scale.  Outpatient referral to endocrinology PPI twice daily prescribed for 30 days.  Advised to discuss further with PCP if her symptoms persist then she might need endoscopy. Antiemetics prescribed Norvasc daily prescribed as well Recently she was prescribed libre which she will pick up for continuous blood glucose monitoring   Discharge Condition: Stable CODE STATUS: Full code Diet recommendation: Diabetic  Brief/Interim Summary: 41 year old with history of HTN, DM 2, depression/anxiety, morbid obesity with BMI greater than 45, anemia, CKD stage IIIa admitted for nausea, vomiting, diarrhea and abdominal pain.  She was recently hospitalized for similar reason 10/13 - 10/15.  UDS and CT abdomen pelvis were negative at that time.  Lipase normal.  UA was suggestive of possible UTI but mostly remains asymptomatic.  Over the course of several days with supportive care and she felt much better.  She was given PPI twice daily as well.  Today she is medically stable for discharge.  Husband present at bedside.  Rest of the recommendations as stated above.     Assessment & Plan:  Principal Problem:   Nausea vomiting and diarrhea Active Problems:   Acute renal failure superimposed on stage 3a chronic kidney disease (HCC)   Microcytic anemia   Leukocytosis   Type II diabetes mellitus with renal manifestations (HCC)   Essential hypertension   Elevated lipase   Depression with anxiety   Prolonged QT interval   Cough   Morbid obesity with BMI of 45.0-49.9,  adult (HCC)   Intractable nausea and vomiting   Nausea vomiting and diarrhea, resolved Etiology is not clear.  Patient had a negative CT scan of the abdomen/pelvis on 09/07/2022.  UDS was negative in that admission.  GI panel/C. difficile currently pending collection.  LFTs, lipase normal.  As needed antiemetics prescribed at home.  PPI twice daily for 30 days for now.  If her symptoms persist, she will need GI evaluation.   Acute renal failure superimposed on stage 3a chronic kidney disease ([place]), resolved Baseline creatinine 1.29 on 09/08/22.  Creatinine peaked at 1.55.  Discharge creatinine 1.1.   Microcytic anemia He will [place] slowly drifted down.  Today 7.8.  This appears to be dilutional as all the cell lines have come down.  She will need to keep a close eye on this with her PCP.   Leukocytosis Resolved.   Type II diabetes mellitus with renal manifestations ([place]), with hyperglycemia Recent A1c 12.3.  Continue new home regimen at this time.  She tells me she takes metformin Toujeo.  She was recently prescribed libre for continuous glucose monitoring which she still has to pick up.  I have added NovoLog FlexPen for sliding scale.  I will also made endocrinology referral.  She really needs to follow this up with her PCP outpatient endocrinology   Essential hypertension -Resume home Cozaar, amlodipine 5 mg daily added   Elevated lipase Lipase is 69, repeat lipase 75.  Does not meet criteria for pancreatitis at this time.  CT abdomen pelvis is also negative.   Depression with anxiety - Hold home Lexapro and hydroxyzine due to QTc prolonged patient  Prolonged QT interval; 502 - Avoid using QT prolonging medications such as Zofran   Cough Chest x-ray negative. -Follow-up COVID PCR- neg   Morbid obesity with BMI of 45.0-49.9, adult (HCC)   BMI= 47.49   and BW= 114 kg -Diet and exercise.   -Encourage to lose weight.    Hypokalemia Replete prn       Discharge Diagnoses:   Principal Problem:   Nausea vomiting and diarrhea Active Problems:   Acute renal failure superimposed on stage 3a chronic kidney disease (HCC)   Microcytic anemia   Leukocytosis   Type II diabetes mellitus with renal manifestations (HCC)   Essential hypertension   Elevated lipase   Depression with anxiety   Prolonged QT interval   Cough   Morbid obesity with BMI of 45.0-49.9, adult (HCC)   Intractable nausea and vomiting      Consultations: None  Subjective: Feels great no complaints today.  Husband is present at bedside, patient wishes to go home.  Discharge Exam: Vitals:   09/14/22 0332 09/14/22 0812  BP: 112/70 127/64  Pulse: (!) 102 99  Resp: 20 17  Temp: 98 F (36.7 C) 98.7 F (37.1 C)  SpO2: 100% 98%   Vitals:   09/13/22 1612 09/13/22 1922 09/14/22 0332 09/14/22 0812  BP: 122/73 132/72 112/70 127/64  Pulse: (!) 110 96 (!) 102 99  Resp: 14 20 20 17   Temp: 98.5 F (36.9 C) 98.6 F (37 C) 98 F (36.7 C) 98.7 F (37.1 C)  TempSrc: Oral Oral Oral Oral  SpO2: 100% 100% 100% 98%  Weight:      Height:        General: Pt is alert, awake, not in acute distress Cardiovascular: RRR, S1/S2 +, no rubs, no gallops Respiratory: CTA bilaterally, no wheezing, no rhonchi Abdominal: Soft, NT, ND, bowel sounds + Extremities: no edema, no cyanosis  Discharge Instructions  Discharge Instructions     Ambulatory referral to Endocrinology   Complete by: As directed    Uncontrolled DM2; insulin dependent.      Allergies as of 09/14/2022       Reactions   Sulfa Antibiotics Hives   Lisinopril Cough   cough        Medication List     TAKE these medications    amLODipine 5 MG tablet Commonly known as: NORVASC Take 1 tablet (5 mg total) by mouth daily.   escitalopram 20 MG tablet Commonly known as: LEXAPRO Take 40 mg by mouth daily.   hydrOXYzine 50 MG tablet Commonly known as: ATARAX Take 50 mg by mouth 3 (three) times daily as needed for  anxiety.   losartan 25 MG tablet Commonly known as: COZAAR Take 1 tablet (25 mg total) by mouth daily.   metFORMIN 1000 MG tablet Commonly known as: GLUCOPHAGE Take 1,000 mg by mouth 2 (two) times daily with a meal.   metoCLOPramide 10 MG tablet Commonly known as: Reglan Take 1 tablet (10 mg total) by mouth every 8 (eight) hours as needed for nausea.   NovoLOG FlexPen 100 UNIT/ML FlexPen Generic drug: insulin aspart CBG 70 - 120: 0 units CBG 121 - 150: 1 unit CBG 151 - 200: 2 units CBG 201 - 250: 3 units CBG 251 - 300: 5 units CBG 301 - 350: 7 units CBG 351 - 400: 9 units CBG > 400: call MD   pantoprazole 40 MG tablet Commonly known as: PROTONIX Take 1 tablet (40 mg total) by mouth 2 (two) times daily before  a meal.   Toujeo Max SoloStar 300 UNIT/ML Solostar Pen Generic drug: insulin glargine (2 Unit Dial) Inject 80 Units into the skin daily.        Follow-up Information     Ethelda Chick, MD. Go on 09/17/2022.   Specialty: Obstetrics and Gynecology Why: 4pm appointment Contact information: [address] [place] [address] [phone]                Allergies  Allergen Reactions   Sulfa Antibiotics Hives   Lisinopril Cough    cough    You were cared for by a hospitalist during your hospital stay. If you have any questions about your discharge medications or the care you received while you were in the hospital after you are discharged, you can call the unit and asked to speak with the hospitalist on call if the hospitalist that took care of you is not available. Once you are discharged, your primary care physician will handle any further medical issues. Please note that no refills for any discharge medications will be authorized once you are discharged, as it is imperative that you return to your primary care physician (or establish a relationship with a primary care physician if you do not have one) for your aftercare needs so that they can reassess your  need for medications and monitor your lab values.   Procedures/Studies: DG Abd 1 View  Result Date: 09/12/2022 CLINICAL DATA:  Nausea and vomiting EXAM: ABDOMEN - 1 VIEW COMPARISON:  CT done on 09/07/2022 FINDINGS: Bowel gas pattern is nonspecific. Small amount of stool is seen in colon. No abnormal masses or calcifications are seen. Cardiac size is increased. Visualized lower lung fields are clear. IMPRESSION: No radiographic abnormalities are seen in abdomen. Electronically Signed   By: Ernie Avena M.D.   On: 09/12/2022 16:41   DG Chest Port 1 View  Result Date: 09/10/2022 CLINICAL DATA:  Nausea and vomiting EXAM: PORTABLE CHEST 1 VIEW COMPARISON:  08/23/2022 FINDINGS: The heart size and mediastinal contours are within normal limits. Both lungs are clear. The visualized skeletal structures are unremarkable. IMPRESSION: No active disease. Electronically Signed   By: Deatra Robinson M.D.   On: 09/10/2022 02:51   CT ABDOMEN PELVIS W CONTRAST  Result Date: 09/07/2022 CLINICAL DATA:  Abdominal pain, nausea/vomiting EXAM: CT ABDOMEN AND PELVIS WITH CONTRAST TECHNIQUE: Multidetector CT imaging of the abdomen and pelvis was performed using the standard protocol following bolus administration of intravenous contrast. RADIATION DOSE REDUCTION: This exam was performed according to the departmental dose-optimization program which includes automated exposure control, adjustment of the mA and/or kV according to patient size and/or use of iterative reconstruction technique. CONTRAST:  OMNIPAQUE IOHEXOL 300 MG/ML  SOLN COMPARISON:  CT pelvis dated 03/21/2022. CT abdomen/pelvis dated 09/21/2020. FINDINGS: Lower chest: Lung bases are clear. Hepatobiliary: Liver is within normal limits. Gallbladder is unremarkable. No intrahepatic or extrahepatic ductal dilatation. Pancreas: Within normal limits. Spleen: Within normal limits. Adrenals/Urinary Tract: Adrenal glands are within normal limits. Subcentimeter  left upper pole renal cyst (series 2/image 40), measuring simple fluid density, benign (Bosniak I). Right kidney is within normal limits. No hydronephrosis. Bladder is within normal limits. Stomach/Bowel: Stomach is within normal limits. No evidence of bowel obstruction. Normal appendix (series 2/image 50). No colonic wall thickening or inflammatory changes. Vascular/Lymphatic: No evidence of abdominal aortic aneurysm. No suspicious abdominopelvic lymphadenopathy. Reproductive: Uterus is within normal limits. Bilateral ovaries are within normal limits. Other: No abdominopelvic ascites. Musculoskeletal: Visualized osseous structures are  within normal limits. IMPRESSION: Negative CT abdomen/pelvis. Electronically Signed   By: Charline Bills M.D.   On: 09/07/2022 00:55   CT Head Wo Contrast  Result Date: 08/23/2022 CLINICAL DATA:  Neuro deficit, acute, stroke suspected EXAM: CT HEAD WITHOUT CONTRAST TECHNIQUE: Contiguous axial images were obtained from the base of the skull through the vertex without intravenous contrast. RADIATION DOSE REDUCTION: This exam was performed according to the departmental dose-optimization program which includes automated exposure control, adjustment of the mA and/or kV according to patient size and/or use of iterative reconstruction technique. COMPARISON:  None Available. FINDINGS: Brain: No evidence of acute infarction, hemorrhage, hydrocephalus, extra-axial collection or mass lesion/mass effect. Vascular: No hyperdense vessel identified. Skull: No acute fracture. Sinuses/Orbits: Left ethmoid and sphenoid paranasal sinus mucosal thickening. No acute orbital findings. Other: No mastoid effusions. IMPRESSION: No evidence of acute intracranial abnormality. Electronically Signed   By: Feliberto Harts M.D.   On: 08/23/2022 08:02   DG Chest 2 View  Result Date: 08/23/2022 CLINICAL DATA:  Chest pain. EXAM: CHEST - 2 VIEW COMPARISON:  October 27, 21. FINDINGS: The heart size and  mediastinal contours are within normal limits. Both lungs are clear. No visible pleural effusions or pneumothorax. No acute osseous abnormality. IMPRESSION: No active cardiopulmonary disease. Electronically Signed   By: Feliberto Harts M.D.   On: 08/23/2022 07:49     The results of significant diagnostics from this hospitalization (including imaging, microbiology, ancillary and laboratory) are listed below for reference.     Microbiology: Recent Results (from the past 240 hour(s))  Urine Culture     Status: Abnormal   Collection Time: 09/10/22  8:24 AM   Specimen: Urine, Clean Catch  Result Value Ref Range Status   Specimen Description   Final    URINE, CLEAN CATCH Performed at [facility], [address], [place], [place] [address]    Special Requests   Final    NONE Performed at [facility], [address] [place], [place] [address]    Culture MULTIPLE SPECIES PRESENT, SUGGEST RECOLLECTION (A)  Final   Report Status 09/11/2022 FINAL  Final  SARS Coronavirus 2 by RT PCR (hospital order, performed in [facility] hospital lab) *cepheid single result test* Urine, Clean Catch     Status: None   Collection Time: 09/11/22  4:12 AM   Specimen: Urine, Clean Catch; Nasal Swab  Result Value Ref Range Status   SARS Coronavirus 2 by RT PCR NEGATIVE NEGATIVE Final    Comment: (NOTE) SARS-CoV-2 target nucleic acids are NOT DETECTED.  The SARS-CoV-2 RNA is generally detectable in upper and lower respiratory specimens during the acute phase of infection. The lowest concentration of SARS-CoV-2 viral copies this assay can detect is 250 copies / mL. A negative result does not preclude SARS-CoV-2 infection and should not be used as the sole basis for treatment or other patient management decisions.  A negative result may occur with improper specimen collection / handling, submission of specimen other than nasopharyngeal swab, presence of viral mutation(s) within the areas  targeted by this assay, and inadequate number of viral copies (<250 copies / mL). A negative result must be combined with clinical observations, patient history, and epidemiological information.  Fact Sheet for Patients:   RoadLapTop.co.za  Fact Sheet for Healthcare Providers: http://kim-miller.com/  This test is not yet approved or  cleared by the [place] FDA and has been authorized for detection and/or diagnosis of SARS-CoV-2 by FDA under an Emergency Use Authorization (EUA).  This EUA will remain in effect (meaning this test can be used) for the duration of the COVID-19 declaration under Section 564(b)(1) of the Act, 21 U.S.C. section 360bbb-3(b)(1), unless the authorization is terminated or revoked sooner.  Performed at [facility], [address] [place], [place] [address]      Labs: BNP (last 3 results) No results for input(s): "BNP" in the last 8760 hours. Basic Metabolic Panel: Recent Labs  Lab 09/08/22 0649 09/09/22 6045 09/10/22 0153 09/11/22 0412 09/12/22 0548 09/13/22 0343 09/13/22 2239 09/14/22 0547  NA 135 133* 131* 137 139 136  --  134*  K 3.3* 3.4* 3.6 3.6 3.3* 3.3*  --  3.6  CL 102 102 101 109 110 110  --  107  CO2 21* 22 18* --  22  GLUCOSE 238* 231* 342* 177* 134* 132* 463* 103*  BUN 28* 25* 33* 27* 25* 15  --  13  CREATININE 1.29* 1.19* 1.55* 1.11* 1.19* 1.14*  --  1.17*  CALCIUM 9.5 9.3 9.7 9.0 8.7* 8.3*  --  7.9*  MG 2.0 2.0  --   --   --  1.7  --  1.9  PHOS  --  3.8  --   --   --   --   --   --    Liver Function Tests: Recent Labs  Lab 09/10/22 0153 09/12/22 0548 09/13/22 0343 09/14/22 0547  AST 16 13* 15 27  ALT ALKPHOS 104 86 97 115  BILITOT 1.3* 0.6 0.9 0.6  PROT 8.8* 7.0 6.9 6.3*  ALBUMIN 3.6 2.9* 2.8* 2.6*   Recent Labs  Lab 09/10/22 0153 09/11/22 0412  LIPASE 69* 75*   No results for input(s): "AMMONIA" in the last 168  hours. CBC: Recent Labs  Lab 09/09/22 0838 09/10/22 0153 09/11/22 0412 09/12/22 0548 09/13/22 0343 09/14/22 0547  WBC 9.2 12.1* 10.5 8.3 8.1 7.1  NEUTROABS 6.6 8.0*  --  5.2  --   --   HGB 10.8* 11.2* 10.4* 9.7* 8.7* 7.8*  HCT 34.5* 36.0 33.9* 31.8* 27.9* 25.4*  MCV 78.6* 79.8* 82.1 82.0 80.9 81.9  PLT 453* 662* 443* 321 304 264   Cardiac Enzymes: No results for input(s): "CKTOTAL", "CKMB", "CKMBINDEX", "TROPONINI" in the last 168 hours. BNP: Invalid input(s): "POCBNP" CBG: Recent Labs  Lab 09/13/22 0811 09/13/22 1206 09/13/22 1728 09/13/22 2121 09/14/22 0807  GLUCAP 102* 314* 226* 428* 96   D-Dimer No results for input(s): "DDIMER" in the last 72 hours. Hgb A1c No results for input(s): "HGBA1C" in the last 72 hours. Lipid Profile No results for input(s): "CHOL", "HDL", "LDLCALC", "TRIG", "CHOLHDL", "LDLDIRECT" in the last 72 hours. Thyroid function studies No results for input(s): "TSH", "T4TOTAL", "T3FREE", "THYROIDAB" in the last 72 hours.  Invalid input(s): "FREET3" Anemia work up No results for input(s): "VITAMINB12", "FOLATE", "FERRITIN", "TIBC", "IRON", "RETICCTPCT" in the last 72 hours. Urinalysis    Component Value Date/Time   COLORURINE YELLOW (A) 09/10/2022 0824   APPEARANCEUR CLOUDY (A) 09/10/2022 0824   LABSPEC 1.022 09/10/2022 0824   PHURINE 5.0 09/10/2022 0824   GLUCOSEU >=500 (A) 09/10/2022 0824   HGBUR LARGE (A) 09/10/2022 0824   BILIRUBINUR NEGATIVE 09/10/2022 0824   KETONESUR 5 (A) 09/10/2022 0824   PROTEINUR 100 (A) 09/10/2022 0824   UROBILINOGEN 0.2 02/05/2015 2125   NITRITE NEGATIVE 09/10/2022 0824   LEUKOCYTESUR NEGATIVE 09/10/2022 0824   Sepsis Labs Recent Labs  Lab 09/11/22 4098 09/12/22 0548  09/13/22 0343 09/14/22 0547  WBC 10.5 8.3 8.1 7.1   Microbiology Recent Results (from the past 240 hour(s))  Urine Culture     Status: Abnormal   Collection Time: 09/10/22  8:24 AM   Specimen: Urine, Clean Catch  Result Value Ref  Range Status   Specimen Description   Final    URINE, CLEAN CATCH Performed at [facility], [address], [place], [place] [address]    Special Requests   Final    NONE Performed at [facility], [address] [place], [place] [address]    Culture MULTIPLE SPECIES PRESENT, SUGGEST RECOLLECTION (A)  Final   Report Status 09/11/2022 FINAL  Final  SARS Coronavirus 2 by RT PCR (hospital order, performed in [facility] hospital lab) *cepheid single result test* Urine, Clean Catch     Status: None   Collection Time: 09/11/22  4:12 AM   Specimen: Urine, Clean Catch; Nasal Swab  Result Value Ref Range Status   SARS Coronavirus 2 by RT PCR NEGATIVE NEGATIVE Final    Comment: (NOTE) SARS-CoV-2 target nucleic acids are NOT DETECTED.  The SARS-CoV-2 RNA is generally detectable in upper and lower respiratory specimens during the acute phase of infection. The lowest concentration of SARS-CoV-2 viral copies this assay can detect is 250 copies / mL. A negative result does not preclude SARS-CoV-2 infection and should not be used as the sole basis for treatment or other patient management decisions.  A negative result may occur with improper specimen collection / handling, submission of specimen other than nasopharyngeal swab, presence of viral mutation(s) within the areas targeted by this assay, and inadequate number of viral copies (<250 copies / mL). A negative result must be combined with clinical observations, patient history, and epidemiological information.  Fact Sheet for Patients:   RoadLapTop.co.za  Fact Sheet for Healthcare Providers: http://kim-miller.com/  This test is not yet approved or  cleared by the [place] FDA and has been authorized for detection and/or diagnosis of SARS-CoV-2 by FDA under an Emergency Use Authorization (EUA).  This EUA will remain in effect (meaning this test can be used) for the  duration of the COVID-19 declaration under Section 564(b)(1) of the Act, 21 U.S.C. section 360bbb-3(b)(1), unless the authorization is terminated or revoked sooner.  Performed at [facility], [address] [place], [place] [address]      Time coordinating discharge:  I have spent 35 minutes face to face with the patient and on the ward discussing the patients care, assessment, plan and disposition with other care givers. >50% of the time was devoted counseling the patient about the risks and benefits of treatment/Discharge disposition and coordinating care.   SIGNED:   Dimple Nanas, MD  [facility] 09/14/2022, 11:02 AM   If 7PM-7AM, please contact night-coverage

## 2022-09-14 NOTE — TOC Initial Note (Signed)
Transition of Care [facility]) - Initial/Assessment Note    Patient Details  Name: Theresa Malone MRN: [id number] Date of Birth: [birth year]  Transition of Care [facility]) CM/SW Contact:    Truddie Hidden, RN Phone Number: 09/14/2022, 10:07 AM  Clinical Narrative:                  Admitted for: Nausea and vomiting Admitted from:Home with husband and four children DZH:[id number] Su Hilt  Pharmacy:[facility] Current home health/prior home health/DME:none Transportation: She drives/ Husband will transport her home at discharge  High risk assessment completed.    Expected Discharge Plan: Home/Self Care Barriers to Discharge: Continued Medical Work up   Patient Goals and CMS Choice Patient states their goals for this hospitalization and ongoing recovery are:: To return home      Expected Discharge Plan and Services Expected Discharge Plan: Home/Self Care       Living arrangements for the past 2 months: Single Family Home Expected Discharge Date: 09/14/22                                    Prior Living Arrangements/Services Living arrangements for the past 2 months: Single Family Home Lives with:: Self, Minor Children, Spouse   Do you feel safe going back to the place where you live?: Yes      Need for Family Participation in Patient Care: Yes (Comment) Care giver support system in place?: Yes (comment)   Criminal Activity/Legal Involvement Pertinent to Current Situation/Hospitalization: No - Comment as needed  Activities of Daily Living Home Assistive Devices/Equipment: None ADL Screening (condition at time of admission) Patient's cognitive ability adequate to safely complete daily activities?: Yes Is the patient deaf or have difficulty hearing?: No Does the patient have difficulty seeing, even when wearing glasses/contacts?: No Does the patient have difficulty concentrating, remembering, or making decisions?: No Patient able to express need for  assistance with ADLs?: Yes Does the patient have difficulty dressing or bathing?: No Independently performs ADLs?: Yes (appropriate for developmental age) Does the patient have difficulty walking or climbing stairs?: No Weakness of Legs: None Weakness of Arms/Hands: None  Permission Sought/Granted                  Emotional Assessment   Attitude/Demeanor/Rapport: Gracious, Engaged Affect (typically observed): Accepting Orientation: : Oriented to Self, Oriented to Place, Oriented to  Time, Oriented to Situation Alcohol / Substance Use: Not Applicable Psych Involvement: No (comment)  Admission diagnosis:  Epigastric pain [R10.13] Intractable nausea and vomiting [R11.2] Patient Active Problem List   Diagnosis Date Noted   Intractable nausea and vomiting 09/12/2022   Nausea vomiting and diarrhea 09/10/2022   Acute renal failure superimposed on stage 3a chronic kidney disease (HCC) 09/10/2022   Type II diabetes mellitus with renal manifestations (HCC) 09/10/2022   Prolonged QT interval 09/10/2022   Depression with anxiety 09/10/2022   Leukocytosis 09/10/2022   Elevated lipase 09/10/2022   Cough 09/10/2022   Gastroenteritis 09/07/2022   Depression    Uncontrolled type 2 diabetes mellitus with hyperglycemia, with long-term current use of insulin (HCC)    Cyst of left ovary 07/21/2021   Microcytic anemia 04/17/2021   Encounter for gynecological examination with Papanicolaou smear of cervix 03/22/2021   Dysmenorrhea 12/15/2020   Encounter for IUD removal 12/15/2020   Sepsis without acute organ dysfunction (HCC)    Acute pyelonephritis 09/22/2020   Pyelonephritis 09/22/2020  Emesis, persistent 05/21/2019   Cellulitis    Intractable vomiting with nausea 04/13/2019   Acute respiratory alkalosis    Intractable vomiting    Chest pain    Cellulitis of right lower extremity 04/12/2019   Intractable abdominal pain 26/37/8588   Metabolic alkalosis 50/27/7412   Respiratory  alkalosis 04/11/2019   Volume depletion 04/11/2019   Hypokalemia 04/11/2019   Hyponatremia 04/11/2019   Yeast vaginitis 03/13/2018   Essential hypertension 02/07/2016   Menorrhagia with regular cycle 02/07/2016   Frequent loose stools 02/07/2016   Status post cesarean section 01/25/2015   Hidradenitis axillaris 09/15/2014   Susceptible to varicella (non-immune), currently pregnant 07/24/2014   Hidradenitis suppurativa 07/20/2014   Morbid obesity with BMI of 45.0-49.9, adult ([place]) 03/03/2014   PCP:  Josephine Cables, MD Pharmacy:   [place], [place] - [place] [address] [address] [place] [address] Phone: [phone] Fax: [phone]  [place] [address] ([address]), [place]) [place] [address] Phone: [phone] Fax: [phone]     Social Determinants of Health ([place]) Interventions    Readmission Risk Interventions    09/13/2022    4:23 PM  Readmission Risk Prevention Plan  Transportation Screening Complete  PCP or Specialist Appt within 5-7 Days Complete  Home Care Screening Complete  Medication Review (RN CM) Complete

## 2022-09-14 NOTE — Discharge Instructions (Signed)
NOVOLOG PEN: SHORT FAST ACTING INSULIN.  CBG 70 - 120: 0 units  CBG 121 - 150: 1 unit  CBG 151 - 200: 2 units  CBG 201 - 250: 3 units  CBG 251 - 300: 5 units  CBG 301 - 350: 7 units  CBG 351 - 400: 9 units  CBG > 400: call MD

## 2022-10-19 ENCOUNTER — Encounter (HOSPITAL_COMMUNITY): Payer: Self-pay | Admitting: Hematology

## 2022-10-19 ENCOUNTER — Ambulatory Visit
Admission: EM | Admit: 2022-10-19 | Discharge: 2022-10-19 | Disposition: A | Discharge status: Home / Self Care | Payer: Medicaid Other | Attending: Urgent Care | Admitting: Urgent Care

## 2022-10-19 DIAGNOSIS — L732 Hidradenitis suppurativa: Secondary | ICD-10-CM | POA: Diagnosis present

## 2022-10-19 DIAGNOSIS — M1A09X0 Idiopathic chronic gout, multiple sites, without tophus (tophi): Secondary | ICD-10-CM | POA: Insufficient documentation

## 2022-10-19 LAB — URIC ACID: Uric Acid, Serum: 6.3 mg/dL (ref 2.5–7.1)

## 2022-10-19 MED ORDER — KETOROLAC TROMETHAMINE 30 MG/ML IJ SOLN
60.0000 mg | Freq: Once | INTRAMUSCULAR | Status: AC
  Administered 2022-10-19: 60 mg via INTRAMUSCULAR

## 2022-10-19 MED ORDER — DOXYCYCLINE HYCLATE 100 MG PO CAPS: 100.0000 mg | ORAL_CAPSULE | Freq: Two times a day (BID) | ORAL | 0 refills | Status: AC

## 2022-10-19 NOTE — ED Provider Notes (Signed)
[facility]    CSN: [id number] Arrival date & time: 10/19/22  1001      History   Chief Complaint Chief Complaint  Patient presents with   Foot Pain   Shoulder Pain   Abscess    HPI Theresa BoutonKelly R Malone is a 41 y.o. female.    Foot Pain  Shoulder Pain Abscess   Presents to UC with complaint of left shoulder arm and foot pain.  She reports recent treatment with prednisone for gout.  PMH significant for diabetes, recently poorly controlled per chart (A1C equal 12.3).  She also endorses diagnosis of hidradenitis suppurativa causing abscess in her right buttock.  She is not currently under the care of of a dermatologist but states she has been treated for this condition since age 41.  Chart shows prescription for colchicine.  Past Medical History:  Diagnosis Date   Anxiety    panic attacks   Cellulitis    Depression    Diabetes mellitus    Gout    Hidradenitis suppurativa    Hypertension    Obesity    Venous stasis    Venous stasis dermatitis     Patient Active Problem List   Diagnosis Date Noted   Intractable nausea and vomiting 09/12/2022   Nausea vomiting and diarrhea 09/10/2022   Acute renal failure superimposed on stage 3a chronic kidney disease (HCC) 09/10/2022   Type II diabetes mellitus with renal manifestations (HCC) 09/10/2022   Prolonged QT interval 09/10/2022   Depression with anxiety 09/10/2022   Leukocytosis 09/10/2022   Elevated lipase 09/10/2022   Cough 09/10/2022   Gastroenteritis 09/07/2022   Depression    Uncontrolled type 2 diabetes mellitus with hyperglycemia, with long-term current use of insulin (HCC)    Cyst of left ovary 07/21/2021   Microcytic anemia 04/17/2021   Encounter for gynecological examination with Papanicolaou smear of cervix 03/22/2021   Dysmenorrhea 12/15/2020   Encounter for IUD removal 12/15/2020   Sepsis without acute organ dysfunction (HCC)    Acute pyelonephritis 09/22/2020   Pyelonephritis 09/22/2020    Emesis, persistent 05/21/2019   Cellulitis    Intractable vomiting with nausea 04/13/2019   Acute respiratory alkalosis    Intractable vomiting    Chest pain    Cellulitis of right lower extremity 04/12/2019   Intractable abdominal pain 04/11/2019   Metabolic alkalosis 04/11/2019   Respiratory alkalosis 04/11/2019   Volume depletion 04/11/2019   Hypokalemia 04/11/2019   Hyponatremia 04/11/2019   Yeast vaginitis 03/13/2018   Essential hypertension 02/07/2016   Menorrhagia with regular cycle 02/07/2016   Frequent loose stools 02/07/2016   Status post cesarean section 01/25/2015   Hidradenitis axillaris 09/15/2014   Susceptible to varicella (non-immune), currently pregnant 07/24/2014   Hidradenitis suppurativa 07/20/2014   Morbid obesity with BMI of 45.0-49.9, adult (HCC) 03/03/2014    Past Surgical History:  Procedure Laterality Date   CESAREAN SECTION     C/S x 2   CESAREAN SECTION N/A 01/24/2015   Procedure: CESAREAN SECTION;  Surgeon: Catalina AntiguaPeggy Constant, MD;  Location: [facility];  Service: Obstetrics;  Laterality: N/A;   TUBAL LIGATION      OB History     Gravida  4   Para  4   Term  2   Preterm  2   AB      Living  4      SAB      IAB      Ectopic      Multiple  0  Live Births  4            Home Medications    Prior to Admission medications   Medication Sig Start Date End Date Taking? Authorizing Provider  BD PEN NEEDLE MICRO U/F 32G X 6 MM MISC Inject into the skin daily. 08/27/22  Yes [provider]  COLCRYS 0.6 MG tablet Take 0.6 mg by mouth daily. 09/22/22  Yes [provider]  Continuous Blood Gluc Receiver (FREESTYLE LIBRE 2 READER) DEVI USE TO CHECK GLUCOSE AS DIRECTED 04/25/22  Yes [provider]  Continuous Blood Gluc Sensor (FREESTYLE LIBRE 2 SENSOR) MISC USE AS DIRECTED TO CHECK BLOOD SUGAR AND CHANGE EVERY 14 DAYS 09/17/22  Yes [provider]  Dextromethorphan-guaiFENesin 10-100 MG/5ML liquid Take  10 mLs by mouth every 6 (six) hours as needed. 08/21/22  Yes [provider]  doxycycline (VIBRAMYCIN) 100 MG capsule Take 1 capsule (100 mg total) by mouth 2 (two) times daily. 10/19/22 01/17/23 Yes Karrington Mccravy, Jeannett Senior, FNP  gabapentin (NEURONTIN) 300 MG capsule Take 300 mg by mouth 2 (two) times daily. 09/22/22  Yes [provider]  guaifenesin (HUMIBID E) 400 MG TABS tablet Take 400 mg by mouth 2 (two) times daily. 08/22/22  Yes [provider]  amLODipine (NORVASC) 5 MG tablet Take 1 tablet (5 mg total) by mouth daily. 09/14/22   Amin, Ankit Chirag, MD  escitalopram (LEXAPRO) 20 MG tablet Take 40 mg by mouth daily. 08/27/22   [provider]  hydrOXYzine (ATARAX) 50 MG tablet Take 50 mg by mouth 3 (three) times daily as needed for anxiety.    [provider]  insulin aspart (NOVOLOG FLEXPEN) 100 UNIT/ML FlexPen CBG 70 - 120: 0 units CBG 121 - 150: 1 unit CBG 151 - 200: 2 units CBG 201 - 250: 3 units CBG 251 - 300: 5 units CBG 301 - 350: 7 units CBG 351 - 400: 9 units CBG > 400: call MD 09/14/22   Stephania Fragmin Chirag, MD  insulin glargine, 2 Unit Dial, (TOUJEO MAX SOLOSTAR) 300 UNIT/ML Solostar Pen Inject 80 Units into the skin daily.    [provider]  losartan (COZAAR) 25 MG tablet Take 1 tablet (25 mg total) by mouth daily. 09/10/22   Lurene Shadow, MD  metFORMIN (GLUCOPHAGE) 1000 MG tablet Take 1,000 mg by mouth 2 (two) times daily with a meal.    [provider]  metoCLOPramide (REGLAN) 10 MG tablet Take 1 tablet (10 mg total) by mouth every 8 (eight) hours as needed for nausea. 09/14/22   Amin, Loura Halt, MD  pantoprazole (PROTONIX) 40 MG tablet Take 1 tablet (40 mg total) by mouth 2 (two) times daily before a meal. 09/14/22   Amin, Loura Halt, MD  clonazepam (KLONOPIN) 0.125 MG disintegrating tablet Take 1 tablet (0.125 mg total) by mouth 2 (two) times daily. Patient not taking: Reported on 05/19/2019 04/15/19 05/21/19  Catarina Hartshorn,  MD    Family History Family History  Problem Relation Age of Onset   Diabetes Paternal Grandfather    Cancer Paternal Grandfather        liver   Cancer Paternal Grandmother        liver   Hypertension Father    Diabetes Mother    Hypertension Mother    Diabetes Maternal Uncle    Diabetes Maternal Grandmother    Cancer Maternal Grandfather    Diabetes Daughter        boarderline    Asthma Daughter  Bronchitis Daughter    Bronchitis Son    Bronchitis Daughter     Social History Social History   Tobacco Use   Smoking status: Never   Smokeless tobacco: Never  Vaping Use   Vaping Use: Never used  Substance Use Topics   Alcohol use: Not Currently    Comment: wine occ   Drug use: No     Allergies   Sulfa antibiotics and Lisinopril   Review of Systems Review of Systems   Physical Exam Triage Vital Signs ED Triage Vitals  Enc Vitals Group     BP 10/19/22 1033 (!) 161/101     Pulse Rate 10/19/22 1033 97     Resp 10/19/22 1033 17     Temp 10/19/22 1033 97.7 F (36.5 C)     Temp src --      SpO2 10/19/22 1033 97 %     Weight --      Height --      Head Circumference --      Peak Flow --      Pain Score 10/19/22 1034 10     Pain Loc --      Pain Edu? --      Excl. in GC? --    No data found.  Updated Vital Signs BP (!) 161/101   Pulse 97   Temp 97.7 F (36.5 C)   Resp 17   LMP 08/26/2022 (Approximate)   SpO2 97%   Visual Acuity Right Eye Distance:   Left Eye Distance:   Bilateral Distance:    Right Eye Near:   Left Eye Near:    Bilateral Near:     Physical Exam Vitals reviewed.  Constitutional:      Appearance: Normal appearance. She is obese.  Skin:    General: Skin is warm and dry.  Neurological:     General: No focal deficit present.     Mental Status: She is alert and oriented to person, place, and time.  Psychiatric:        Mood and Affect: Mood normal.        Behavior: Behavior normal.      UC Treatments / Results   Labs (all labs ordered are listed, but only abnormal results are displayed) Labs Reviewed  URIC ACID    EKG   Radiology No results found.  Procedures Procedures (including critical care time)  Medications Ordered in UC Medications  ketorolac (TORADOL) 30 MG/ML injection 60 mg (has no administration in time range)    Initial Impression / Assessment and Plan / UC Course  I have reviewed the triage vital signs and the nursing notes.  Pertinent labs & imaging results that were available during my care of the patient were reviewed by me and considered in my medical decision making (see chart for details).   Extensive discussion with the patient regarding her presumed gout diagnosis and use of prednisone to treat.  Prednisone is likely causing elevation of her blood sugars which is making it more difficult to keep DM2 under control.  This is likely causing exacerbation of hidradenitis suppurativa.  She has prescription for colchicine but states it is been ineffective.  She is unaware of any recent measurement of uric acid or positive diagnosis of gout secondary to elevated uric acid.  Chart shows uric acid level 2 years ago at 4.7 with level of 8 in 2012.  She requests injection of steroid which I declined given her already constant use of oral steroids  and no evidence that local steroid injection will be effective.  "Gout".  Recommended follow-up with her primary care provider and/or evaluation by orthopedics.  Immediate use of colchicine for treatment of her symptoms.  Recommended cessation of prednisone.  Will order uric acid level today and have her follow-up with her PCP.  Will give Toradol injection for pain management and anti-inflammatory and provide instructions for colchicine use at home.  HS. recommend ASAP follow-up with dermatology for evaluation and treatment.  Will prescribe doxycycline per protocol to reduce bacterial load.  Also providing injection of Toradol for pain  relief today both from gout and HS.  Final Clinical Impressions( s) / UC Diagnoses   Final diagnoses:  Chronic gout of multiple sites, unspecified cause  Hidradenitis suppurativa     Discharge Instructions      Use colchicine to treat your symptoms of gout. Recommend you follow-up with an orthopedic provider for evaluation of your joint pain.  For treatment of gout flares:  Day 1: Take colchicine 2 tablets (1.2 mg) at the first sign of flare, followed by 0.6 mg after 1 hour; maximum total dose: 1.8 mg/day on day 1 (3 tablets). Initiate as soon as possible, ideally within 12 to 24 hours of flare onset.  Day 2 and thereafter: Oral: Take colchicine 1 tablet (0.6 mg) once or twice daily until flare resolves. Continue treatment for 48 hours after flare resolves; if symptoms do not improve after 2 to 3 days, consider switching to other anti-inflammatory agents.  Recommend asap follow-up with dermatology for evaluation and treatment of hidradenitis suppurativa.         ED Prescriptions     Medication Sig Dispense Auth. Provider   doxycycline (VIBRAMYCIN) 100 MG capsule Take 1 capsule (100 mg total) by mouth 2 (two) times daily. 180 capsule Chalsea Darko, FNP      PDMP not reviewed this encounter.   Charma Igo, [place] 10/19/22 1121

## 2022-10-19 NOTE — Discharge Instructions (Signed)
Use colchicine to treat your symptoms of gout. Recommend you follow-up with an orthopedic provider for evaluation of your joint pain.  For treatment of gout flares:  Day 1: Take colchicine 2 tablets (1.2 mg) at the first sign of flare, followed by 0.6 mg after 1 hour; maximum total dose: 1.8 mg/day on day 1 (3 tablets). Initiate as soon as possible, ideally within 12 to 24 hours of flare onset.  Day 2 and thereafter: Oral: Take colchicine 1 tablet (0.6 mg) once or twice daily until flare resolves. Continue treatment for 48 hours after flare resolves; if symptoms do not improve after 2 to 3 days, consider switching to other anti-inflammatory agents.  Recommend asap follow-up with dermatology for evaluation and treatment of hidradenitis suppurativa.

## 2022-10-19 NOTE — ED Triage Notes (Signed)
Pt. Presents to UC w/c/o left, shoulder, arm and foot pain and expresses concern for gout for the past month. Pt. States she has been taking PRN prednisone but is concerned that taking it so often will cause adverse effects. Pt. Also endorses an abscess to her right buttock and has a hx of Hidradenitis.

## 2022-10-20 ENCOUNTER — Emergency Department
Admission: EM | Admit: 2022-10-20 | Discharge: 2022-10-20 | Discharge status: Against Medical Advice | Payer: Medicaid Other | Source: Home / Self Care | Attending: Emergency Medicine | Admitting: Emergency Medicine

## 2022-10-20 ENCOUNTER — Encounter: Payer: Self-pay | Admitting: Emergency Medicine

## 2022-10-20 ENCOUNTER — Other Ambulatory Visit: Payer: Self-pay

## 2022-10-20 DIAGNOSIS — R1010 Upper abdominal pain, unspecified: Secondary | ICD-10-CM | POA: Insufficient documentation

## 2022-10-20 DIAGNOSIS — R112 Nausea with vomiting, unspecified: Secondary | ICD-10-CM | POA: Insufficient documentation

## 2022-10-20 DIAGNOSIS — R739 Hyperglycemia, unspecified: Secondary | ICD-10-CM | POA: Insufficient documentation

## 2022-10-20 DIAGNOSIS — Z5329 Procedure and treatment not carried out because of patient's decision for other reasons: Secondary | ICD-10-CM | POA: Insufficient documentation

## 2022-10-20 LAB — LIPASE, BLOOD: Lipase: 49 U/L (ref 11–51)

## 2022-10-20 LAB — COMPREHENSIVE METABOLIC PANEL
ALT: 20 U/L (ref 0–44)
AST: 14 U/L — ABNORMAL LOW (ref 15–41)
Albumin: 3.4 g/dL — ABNORMAL LOW (ref 3.5–5.0)
Alkaline Phosphatase: 144 U/L — ABNORMAL HIGH (ref 38–126)
Anion gap: 12 (ref 5–15)
BUN: 25 mg/dL — ABNORMAL HIGH (ref 6–20)
CO2: 23 mmol/L (ref 22–32)
Calcium: 9.6 mg/dL (ref 8.9–10.3)
Chloride: 99 mmol/L (ref 98–111)
Creatinine, Ser: 1.24 mg/dL — ABNORMAL HIGH (ref 0.44–1.00)
GFR, Estimated: 57 mL/min — ABNORMAL LOW (ref >60)
Glucose, Bld: 411 mg/dL — ABNORMAL HIGH (ref 70–99)
Potassium: 3.8 mmol/L (ref 3.5–5.1)
Sodium: 134 mmol/L — ABNORMAL LOW (ref 135–145)
Total Bilirubin: 0.7 mg/dL (ref 0.3–1.2)
Total Protein: 8.7 g/dL — ABNORMAL HIGH (ref 6.5–8.1)

## 2022-10-20 LAB — URINALYSIS, ROUTINE W REFLEX MICROSCOPIC
Bilirubin Urine: NEGATIVE
Glucose, UA: >=500 mg/dL — AB
Ketones, ur: 20 mg/dL — AB
Leukocytes,Ua: NEGATIVE
Nitrite: NEGATIVE
Protein, ur: 100 mg/dL — AB
Specific Gravity, Urine: 1.016 (ref 1.005–1.030)
pH: 7 (ref 5.0–8.0)

## 2022-10-20 LAB — CBC
HCT: 32 % — ABNORMAL LOW (ref 36.0–46.0)
Hemoglobin: 10.3 g/dL — ABNORMAL LOW (ref 12.0–15.0)
MCH: 25.4 pg — ABNORMAL LOW (ref 26.0–34.0)
MCHC: 32.2 g/dL (ref 30.0–36.0)
MCV: 79 fL — ABNORMAL LOW (ref 80.0–100.0)
Platelets: 433 10*3/uL — ABNORMAL HIGH (ref 150–400)
RBC: 4.05 MIL/uL (ref 3.87–5.11)
RDW: 15.6 % — ABNORMAL HIGH (ref 11.5–15.5)
WBC: 10.5 10*3/uL (ref 4.0–10.5)
nRBC: 0 % (ref 0.0–0.2)

## 2022-10-20 LAB — CBG MONITORING, ED: Glucose-Capillary: 372 mg/dL — ABNORMAL HIGH (ref 70–99)

## 2022-10-20 LAB — BETA-HYDROXYBUTYRIC ACID: Beta-Hydroxybutyric Acid: 1.2 mmol/L — ABNORMAL HIGH (ref 0.05–0.27)

## 2022-10-20 MED ORDER — ONDANSETRON HCL 4 MG/2ML IJ SOLN
4.0000 mg | Freq: Once | INTRAMUSCULAR | Status: AC
  Administered 2022-10-20: 4 mg via INTRAVENOUS
  Filled 2022-10-20: qty 2

## 2022-10-20 MED ORDER — SODIUM CHLORIDE 0.9 % IV BOLUS
1000.0000 mL | Freq: Once | INTRAVENOUS | Status: AC
  Administered 2022-10-20: 1000 mL via INTRAVENOUS

## 2022-10-20 MED ORDER — PROCHLORPERAZINE EDISYLATE 10 MG/2ML IJ SOLN
10.0000 mg | Freq: Once | INTRAMUSCULAR | Status: AC
  Administered 2022-10-20: 10 mg via INTRAVENOUS
  Filled 2022-10-20: qty 2

## 2022-10-20 MED ORDER — ONDANSETRON 4 MG PO TBDP
4.0000 mg | ORAL_TABLET | Freq: Once | ORAL | Status: AC | PRN
  Administered 2022-10-20: 4 mg via ORAL

## 2022-10-20 NOTE — ED Triage Notes (Signed)
Patient arrives ambulatory by POV with husband c/o emesis since last night. Patient states she was seen yesterday at [facility] and given medicine for joint paint however after dinner started throwing up and has not been able to stop.

## 2022-10-20 NOTE — ED Provider Triage Note (Addendum)
Emergency Medicine Provider Triage Evaluation Note  Theresa Malone, a 41 y.o. female history of hypertension, DM, depression, anxiety, morbid obesity, anemia, CKD was evaluated in triage.  Pt complains of intractable nausea and vomiting.  Patient was onset of symptoms last night.  She also endorses some epigastric pain.  Review of Systems  Positive: NV Negative: Bowel changes  Physical Exam  BP (!) 209/119 (BP Location: Left Arm)   Pulse (!) 101   Temp 98.1 F (36.7 C) (Oral)   Resp 19   Ht 5\' 1"  (1.549 m)   Wt 113.9 kg   LMP 08/26/2022 (Approximate)   SpO2 96%   BMI 47.43 kg/m  Gen:   Awake, no distress  Resp:  Normal effort  MSK:   Moves extremities without difficulty  Other:    Medical Decision Making  Medically screening exam initiated at 4:27 PM.  Appropriate orders placed.  10/26/2022 [place] was informed that the remainder of the evaluation will be completed by another provider, this initial triage assessment does not replace that evaluation, and the importance of remaining in the ED until their evaluation is complete.  Patient to the ED for evaluation of intractable nausea and vomiting with onset last night.   Theresa Coyer, PA-C 10/20/22 [address], [address], [place] 10/20/22 [phone]

## 2022-10-20 NOTE — ED Notes (Signed)
UA preg neg

## 2022-10-20 NOTE — ED Provider Notes (Signed)
[facility] Provider Note    Event Date/Time   First MD Initiated Contact with Patient 10/20/22 1834     (approximate)   History   Emesis and Abdominal Pain   HPI  Theresa Malone is a 41 y.o. female  who presents to the emergency department today because of concern for nausea and vomiting. Symptoms started yesterday. The patient went to urgent care because of gout, received a shot, and shortly thereafter started having nausea and vomiting. This has been accompanied by abdominal pain. No fevers. Patient has not tried any medication to help with her symptoms. She says this is the third time she has now had bad nausea and vomiting requiring a hospital visit.        Physical Exam   Triage Vital Signs: ED Triage Vitals  Enc Vitals Group     BP 10/20/22 1623 (!) 209/119     Pulse Rate 10/20/22 1623 (!) 101     Resp 10/20/22 1623 19     Temp 10/20/22 1623 98.1 F (36.7 C)     Temp Source 10/20/22 1623 Oral     SpO2 10/20/22 1623 96 %     Weight 10/20/22 1622 251 lb (113.9 kg)     Height 10/20/22 1622 5\' 1"  (1.549 m)     Head Circumference --      Peak Flow --      Pain Score 10/20/22 1622 10     Pain Loc --      Pain Edu? --      Excl. in GC? --     Most recent vital signs: Vitals:   10/20/22 1623  BP: (!) 209/119  Pulse: (!) 101  Resp: 19  Temp: 98.1 F (36.7 C)  SpO2: 96%   General: Awake, alert, oriented. CV:  Good peripheral perfusion. Regular rate and rhythm Resp:  Normal effort. Lungs clear. Abd:  No distention. Minimally tender in the upper abdomin on both side. Occasional dry heave.   ED Results / Procedures / Treatments   Labs (all labs ordered are listed, but only abnormal results are displayed) Labs Reviewed  COMPREHENSIVE METABOLIC PANEL - Abnormal; Notable for the following components:      Result Value   Sodium 134 (*)    Glucose, Bld 411 (*)    BUN 25 (*)    Creatinine, Ser 1.24 (*)    Total Protein 8.7 (*)     Albumin 3.4 (*)    AST 14 (*)    Alkaline Phosphatase 144 (*)    GFR, Estimated 57 (*)    All other components within normal limits  CBC - Abnormal; Notable for the following components:   Hemoglobin 10.3 (*)    HCT 32.0 (*)    MCV 79.0 (*)    MCH 25.4 (*)    RDW 15.6 (*)    Platelets 433 (*)    All other components within normal limits  BETA-HYDROXYBUTYRIC ACID - Abnormal; Notable for the following components:   Beta-Hydroxybutyric Acid 1.20 (*)    All other components within normal limits  CBG MONITORING, ED - Abnormal; Notable for the following components:   Glucose-Capillary 372 (*)    All other components within normal limits  LIPASE, BLOOD  URINALYSIS, ROUTINE W REFLEX MICROSCOPIC  POC URINE PREG, ED     EKG  None   RADIOLOGY None   PROCEDURES:  Critical Care performed: No  Procedures   MEDICATIONS ORDERED IN ED: Medications  ondansetron (  ZOFRAN-ODT) disintegrating tablet 4 mg (4 mg Oral Given 10/20/22 1627)  sodium chloride 0.9 % bolus 1,000 mL (1,000 mLs Intravenous New Bag/Given 10/20/22 1754)  ondansetron (ZOFRAN) injection 4 mg (4 mg Intravenous Given 10/20/22 1805)     IMPRESSION / MDM / ASSESSMENT AND PLAN / ED COURSE  I reviewed the triage vital signs and the nursing notes.                              Differential diagnosis includes, but is not limited to, DKA, gastroenteritis, gastroparesis.  Patient's presentation is most consistent with acute presentation with potential threat to life or bodily function.  Patient presented to the emergency department today because of concerns for nausea vomiting abdominal pain.  Patient had similar symptoms in the past.  Blood work here does show hyperglycemia but no elevated anion gap.  No leukocytosis to suggest significant infection.  Patient was being given fluids and nausea medication.  Prior to recheck the patient had eloped from the emergency department.  FINAL CLINICAL IMPRESSION(S) / ED DIAGNOSES    Final diagnoses:  Nausea and vomiting, unspecified vomiting type     Note:  This document was prepared using Dragon voice recognition software and may include unintentional dictation errors.    Phineas Semen, MD 10/20/22 2035

## 2022-10-20 NOTE — ED Triage Notes (Signed)
Pt significant other stated pt is unresponsive. Pt non-reactive to voice. Upon sternal rub pt responds to nurse. Significant other at bedside.

## 2022-10-21 ENCOUNTER — Inpatient Hospital Stay
Admission: EM | Admit: 2022-10-21 | Discharge: 2022-10-24 | DRG: 638 | Disposition: A | Discharge status: Home / Self Care | Payer: Medicaid Other | Attending: Internal Medicine | Admitting: Internal Medicine

## 2022-10-21 ENCOUNTER — Other Ambulatory Visit: Payer: Self-pay

## 2022-10-21 DIAGNOSIS — Z833 Family history of diabetes mellitus: Secondary | ICD-10-CM

## 2022-10-21 DIAGNOSIS — I129 Hypertensive chronic kidney disease with stage 1 through stage 4 chronic kidney disease, or unspecified chronic kidney disease: Secondary | ICD-10-CM | POA: Diagnosis present

## 2022-10-21 DIAGNOSIS — F419 Anxiety disorder, unspecified: Secondary | ICD-10-CM | POA: Diagnosis present

## 2022-10-21 DIAGNOSIS — K3184 Gastroparesis: Secondary | ICD-10-CM | POA: Diagnosis present

## 2022-10-21 DIAGNOSIS — L732 Hidradenitis suppurativa: Secondary | ICD-10-CM | POA: Diagnosis present

## 2022-10-21 DIAGNOSIS — E876 Hypokalemia: Secondary | ICD-10-CM | POA: Diagnosis not present

## 2022-10-21 DIAGNOSIS — Z8249 Family history of ischemic heart disease and other diseases of the circulatory system: Secondary | ICD-10-CM | POA: Diagnosis not present

## 2022-10-21 DIAGNOSIS — E1122 Type 2 diabetes mellitus with diabetic chronic kidney disease: Secondary | ICD-10-CM | POA: Diagnosis present

## 2022-10-21 DIAGNOSIS — M109 Gout, unspecified: Secondary | ICD-10-CM | POA: Diagnosis present

## 2022-10-21 DIAGNOSIS — Z91119 Patient's noncompliance with dietary regimen due to unspecified reason: Secondary | ICD-10-CM

## 2022-10-21 DIAGNOSIS — Z79899 Other long term (current) drug therapy: Secondary | ICD-10-CM

## 2022-10-21 DIAGNOSIS — E1110 Type 2 diabetes mellitus with ketoacidosis without coma: Secondary | ICD-10-CM | POA: Diagnosis present

## 2022-10-21 DIAGNOSIS — E861 Hypovolemia: Secondary | ICD-10-CM | POA: Diagnosis present

## 2022-10-21 DIAGNOSIS — N1831 Chronic kidney disease, stage 3a: Secondary | ICD-10-CM | POA: Diagnosis present

## 2022-10-21 DIAGNOSIS — Z6841 Body Mass Index (BMI) 40.0 and over, adult: Secondary | ICD-10-CM | POA: Diagnosis not present

## 2022-10-21 DIAGNOSIS — Z7984 Long term (current) use of oral hypoglycemic drugs: Secondary | ICD-10-CM | POA: Diagnosis not present

## 2022-10-21 DIAGNOSIS — E1165 Type 2 diabetes mellitus with hyperglycemia: Secondary | ICD-10-CM

## 2022-10-21 DIAGNOSIS — Z825 Family history of asthma and other chronic lower respiratory diseases: Secondary | ICD-10-CM

## 2022-10-21 DIAGNOSIS — E1143 Type 2 diabetes mellitus with diabetic autonomic (poly)neuropathy: Secondary | ICD-10-CM | POA: Diagnosis present

## 2022-10-21 DIAGNOSIS — N179 Acute kidney failure, unspecified: Secondary | ICD-10-CM | POA: Diagnosis present

## 2022-10-21 DIAGNOSIS — K920 Hematemesis: Secondary | ICD-10-CM | POA: Diagnosis present

## 2022-10-21 DIAGNOSIS — R112 Nausea with vomiting, unspecified: Secondary | ICD-10-CM

## 2022-10-21 DIAGNOSIS — Z809 Family history of malignant neoplasm, unspecified: Secondary | ICD-10-CM | POA: Diagnosis not present

## 2022-10-21 DIAGNOSIS — Z794 Long term (current) use of insulin: Secondary | ICD-10-CM

## 2022-10-21 DIAGNOSIS — I5031 Acute diastolic (congestive) heart failure: Secondary | ICD-10-CM | POA: Diagnosis not present

## 2022-10-21 DIAGNOSIS — I160 Hypertensive urgency: Secondary | ICD-10-CM | POA: Diagnosis present

## 2022-10-21 DIAGNOSIS — R739 Hyperglycemia, unspecified: Principal | ICD-10-CM

## 2022-10-21 DIAGNOSIS — I10 Essential (primary) hypertension: Secondary | ICD-10-CM

## 2022-10-21 LAB — CBC
HCT: 35.5 % — ABNORMAL LOW (ref 36.0–46.0)
Hemoglobin: 11.4 g/dL — ABNORMAL LOW (ref 12.0–15.0)
MCH: 25.7 pg — ABNORMAL LOW (ref 26.0–34.0)
MCHC: 32.1 g/dL (ref 30.0–36.0)
MCV: 80.1 fL (ref 80.0–100.0)
Platelets: 523 10*3/uL — ABNORMAL HIGH (ref 150–400)
RBC: 4.43 MIL/uL (ref 3.87–5.11)
RDW: 15.9 % — ABNORMAL HIGH (ref 11.5–15.5)
WBC: 14.5 10*3/uL — ABNORMAL HIGH (ref 4.0–10.5)
nRBC: 0 % (ref 0.0–0.2)

## 2022-10-21 LAB — BASIC METABOLIC PANEL
Anion gap: 18 — ABNORMAL HIGH (ref 5–15)
Anion gap: 13 (ref 5–15)
Anion gap: 12 (ref 5–15)
Anion gap: 10 (ref 5–15)
BUN: 28 mg/dL — ABNORMAL HIGH (ref 6–20)
BUN: 25 mg/dL — ABNORMAL HIGH (ref 6–20)
BUN: 23 mg/dL — ABNORMAL HIGH (ref 6–20)
BUN: 23 mg/dL — ABNORMAL HIGH (ref 6–20)
CO2: 24 mmol/L (ref 22–32)
CO2: 21 mmol/L — ABNORMAL LOW (ref 22–32)
CO2: 23 mmol/L (ref 22–32)
CO2: 25 mmol/L (ref 22–32)
Calcium: 10.2 mg/dL (ref 8.9–10.3)
Calcium: 9.4 mg/dL (ref 8.9–10.3)
Calcium: 9.3 mg/dL (ref 8.9–10.3)
Calcium: 9.5 mg/dL (ref 8.9–10.3)
Chloride: 90 mmol/L — ABNORMAL LOW (ref 98–111)
Chloride: 102 mmol/L (ref 98–111)
Chloride: 104 mmol/L (ref 98–111)
Chloride: 105 mmol/L (ref 98–111)
Creatinine, Ser: 1.5 mg/dL — ABNORMAL HIGH (ref 0.44–1.00)
Creatinine, Ser: 1.32 mg/dL — ABNORMAL HIGH (ref 0.44–1.00)
Creatinine, Ser: 1.17 mg/dL — ABNORMAL HIGH (ref 0.44–1.00)
Creatinine, Ser: 1.18 mg/dL — ABNORMAL HIGH (ref 0.44–1.00)
GFR, Estimated: 45 mL/min — ABNORMAL LOW (ref >60)
GFR, Estimated: 53 mL/min — ABNORMAL LOW (ref >60)
GFR, Estimated: >60 mL/min (ref >60)
GFR, Estimated: >60 mL/min (ref >60)
Glucose, Bld: 667 mg/dL (ref 70–99)
Glucose, Bld: 508 mg/dL (ref 70–99)
Glucose, Bld: 271 mg/dL — ABNORMAL HIGH (ref 70–99)
Glucose, Bld: 221 mg/dL — ABNORMAL HIGH (ref 70–99)
Potassium: 4.3 mmol/L (ref 3.5–5.1)
Potassium: 3.7 mmol/L (ref 3.5–5.1)
Potassium: 3.9 mmol/L (ref 3.5–5.1)
Potassium: 3.8 mmol/L (ref 3.5–5.1)
Sodium: 132 mmol/L — ABNORMAL LOW (ref 135–145)
Sodium: 136 mmol/L (ref 135–145)
Sodium: 139 mmol/L (ref 135–145)
Sodium: 140 mmol/L (ref 135–145)

## 2022-10-21 LAB — BLOOD GAS, VENOUS
Acid-Base Excess: 0.5 mmol/L (ref 0.0–2.0)
Bicarbonate: 25.4 mmol/L (ref 20.0–28.0)
O2 Saturation: 57.4 %
Patient temperature: 37
pCO2, Ven: 41 mmHg — ABNORMAL LOW (ref 44–60)
pH, Ven: 7.4 (ref 7.25–7.43)
pO2, Ven: 37 mmHg (ref 32–45)

## 2022-10-21 LAB — CBG MONITORING, ED
Glucose-Capillary: 563 mg/dL (ref 70–99)
Glucose-Capillary: 573 mg/dL (ref 70–99)
Glucose-Capillary: 501 mg/dL (ref 70–99)
Glucose-Capillary: 424 mg/dL — ABNORMAL HIGH (ref 70–99)
Glucose-Capillary: 501 mg/dL (ref 70–99)
Glucose-Capillary: 359 mg/dL — ABNORMAL HIGH (ref 70–99)
Glucose-Capillary: 312 mg/dL — ABNORMAL HIGH (ref 70–99)
Glucose-Capillary: 253 mg/dL — ABNORMAL HIGH (ref 70–99)

## 2022-10-21 LAB — PHOSPHORUS: Phosphorus: 5.3 mg/dL — ABNORMAL HIGH (ref 2.5–4.6)

## 2022-10-21 LAB — MAGNESIUM: Magnesium: 2 mg/dL (ref 1.7–2.4)

## 2022-10-21 LAB — GLUCOSE, CAPILLARY
Glucose-Capillary: 239 mg/dL — ABNORMAL HIGH (ref 70–99)
Glucose-Capillary: 241 mg/dL — ABNORMAL HIGH (ref 70–99)
Glucose-Capillary: 214 mg/dL — ABNORMAL HIGH (ref 70–99)
Glucose-Capillary: 214 mg/dL — ABNORMAL HIGH (ref 70–99)
Glucose-Capillary: 183 mg/dL — ABNORMAL HIGH (ref 70–99)

## 2022-10-21 LAB — BETA-HYDROXYBUTYRIC ACID
Beta-Hydroxybutyric Acid: 2.81 mmol/L — ABNORMAL HIGH (ref 0.05–0.27)
Beta-Hydroxybutyric Acid: 1.65 mmol/L — ABNORMAL HIGH (ref 0.05–0.27)
Beta-Hydroxybutyric Acid: 0.1 mmol/L (ref 0.05–0.27)

## 2022-10-21 MED ORDER — SODIUM CHLORIDE 0.9 % IV BOLUS
1000.0000 mL | Freq: Once | INTRAVENOUS | Status: AC
  Administered 2022-10-21: 1000 mL via INTRAVENOUS

## 2022-10-21 MED ORDER — ENOXAPARIN SODIUM 60 MG/0.6ML IJ SOSY
0.5000 mg/kg | PREFILLED_SYRINGE | INTRAMUSCULAR | Status: DC
  Administered 2022-10-21 – 2022-10-23 (×3): 57.5 mg via SUBCUTANEOUS
  Filled 2022-10-21 (×3): qty 0.6

## 2022-10-21 MED ORDER — LORAZEPAM 2 MG/ML IJ SOLN: 1.0000 mg | Freq: Once | INTRAMUSCULAR | Status: DC

## 2022-10-21 MED ORDER — LORAZEPAM 2 MG/ML IJ SOLN
1.0000 mg | Freq: Once | INTRAMUSCULAR | Status: AC
  Administered 2022-10-21: 1 mg via INTRAVENOUS
  Filled 2022-10-21: qty 1

## 2022-10-21 MED ORDER — POTASSIUM CHLORIDE 10 MEQ/100ML IV SOLN
10.0000 meq | INTRAVENOUS | Status: AC
  Administered 2022-10-21 (×2): 10 meq via INTRAVENOUS
  Filled 2022-10-21: qty 100

## 2022-10-21 MED ORDER — DEXTROSE IN LACTATED RINGERS 5 % IV SOLN: INTRAVENOUS | Status: DC

## 2022-10-21 MED ORDER — PANTOPRAZOLE SODIUM 40 MG IV SOLR
40.0000 mg | Freq: Two times a day (BID) | INTRAVENOUS | Status: DC
  Administered 2022-10-21 – 2022-10-22 (×4): 40 mg via INTRAVENOUS
  Filled 2022-10-21 (×4): qty 10

## 2022-10-21 MED ORDER — INSULIN REGULAR(HUMAN) IN NACL 100-0.9 UT/100ML-% IV SOLN: INTRAVENOUS | Status: DC

## 2022-10-21 MED ORDER — SODIUM CHLORIDE 0.9 % IV SOLN
12.5000 mg | Freq: Four times a day (QID) | INTRAVENOUS | Status: DC | PRN
  Administered 2022-10-21: 12.5 mg via INTRAVENOUS
  Filled 2022-10-21: qty 12.5

## 2022-10-21 MED ORDER — INSULIN REGULAR(HUMAN) IN NACL 100-0.9 UT/100ML-% IV SOLN
INTRAVENOUS | Status: AC
  Administered 2022-10-21: 8 [IU]/h via INTRAVENOUS
  Administered 2022-10-21: 13 [IU]/h via INTRAVENOUS
  Filled 2022-10-21 (×2): qty 100

## 2022-10-21 MED ORDER — LORAZEPAM 2 MG/ML IJ SOLN
1.0000 mg | Freq: Four times a day (QID) | INTRAMUSCULAR | Status: DC | PRN
  Administered 2022-10-21 – 2022-10-22 (×3): 1 mg via INTRAVENOUS
  Filled 2022-10-21 (×3): qty 1

## 2022-10-21 MED ORDER — ONDANSETRON HCL 4 MG/2ML IJ SOLN
4.0000 mg | Freq: Four times a day (QID) | INTRAMUSCULAR | Status: DC | PRN
  Administered 2022-10-21 – 2022-10-24 (×9): 4 mg via INTRAVENOUS
  Filled 2022-10-21 (×10): qty 2

## 2022-10-21 MED ORDER — LACTATED RINGERS IV SOLN: INTRAVENOUS | Status: DC

## 2022-10-21 MED ORDER — AMLODIPINE BESYLATE 5 MG PO TABS
5.0000 mg | ORAL_TABLET | Freq: Every day | ORAL | Status: DC
  Administered 2022-10-21 – 2022-10-24 (×4): 5 mg via ORAL
  Filled 2022-10-21 (×4): qty 1

## 2022-10-21 MED ORDER — METOCLOPRAMIDE HCL 5 MG/ML IJ SOLN
10.0000 mg | Freq: Once | INTRAMUSCULAR | Status: AC
  Administered 2022-10-21: 10 mg via INTRAVENOUS
  Filled 2022-10-21: qty 2

## 2022-10-21 MED ORDER — ONDANSETRON HCL 4 MG/2ML IJ SOLN
4.0000 mg | Freq: Once | INTRAMUSCULAR | Status: AC
  Administered 2022-10-21: 4 mg via INTRAVENOUS
  Filled 2022-10-21: qty 2

## 2022-10-21 MED ORDER — LABETALOL HCL 5 MG/ML IV SOLN
10.0000 mg | INTRAVENOUS | Status: DC | PRN
  Administered 2022-10-21 – 2022-10-23 (×11): 10 mg via INTRAVENOUS
  Filled 2022-10-21 (×9): qty 4

## 2022-10-21 MED ORDER — HYDROMORPHONE HCL 1 MG/ML IJ SOLN
0.5000 mg | INTRAMUSCULAR | Status: DC | PRN
  Administered 2022-10-21 – 2022-10-23 (×5): 0.5 mg via INTRAVENOUS
  Filled 2022-10-21 (×6): qty 1

## 2022-10-21 MED ORDER — DEXTROSE 50 % IV SOLN: 0.0000 mL | INTRAVENOUS | Status: DC | PRN

## 2022-10-21 MED ORDER — ACETAMINOPHEN 325 MG PO TABS
650.0000 mg | ORAL_TABLET | Freq: Once | ORAL | Status: AC
  Administered 2022-10-21: 650 mg via ORAL
  Filled 2022-10-21: qty 2

## 2022-10-21 MED ORDER — LOSARTAN POTASSIUM 50 MG PO TABS
25.0000 mg | ORAL_TABLET | Freq: Every day | ORAL | Status: DC
  Administered 2022-10-21: 25 mg via ORAL
  Filled 2022-10-21: qty 1

## 2022-10-21 MED ORDER — POTASSIUM CHLORIDE IN NACL 20-0.9 MEQ/L-% IV SOLN
Freq: Once | INTRAVENOUS | Status: AC
  Filled 2022-10-21: qty 1000

## 2022-10-21 MED ORDER — SODIUM CHLORIDE 0.9 % IV SOLN
100.0000 mg | Freq: Two times a day (BID) | INTRAVENOUS | Status: DC
  Administered 2022-10-21 – 2022-10-23 (×4): 100 mg via INTRAVENOUS
  Filled 2022-10-21 (×6): qty 100

## 2022-10-21 NOTE — ED Notes (Signed)
Spoke to MD Huel Cote- MD advised to recheck BMP prior to Potassium administration, MD advised of latest BP of 224/120 and advised RN to give Ativan and recheck pressure q1 hr and give Labetalol if indicated.

## 2022-10-21 NOTE — ED Triage Notes (Signed)
Pt presents via POV c/o N/V. Reports seen in ED yesterday and was discharged however has had continued symptoms. Hx DM.

## 2022-10-21 NOTE — Assessment & Plan Note (Addendum)
Minimal hematemesis and vomit after approximately 48 hours of vomiting likely Mallory-Weiss.  CBC with stable hemoglobin.  Some persistent nausea likely secondary to gastroparesis.  Continue PPI.

## 2022-10-21 NOTE — ED Notes (Signed)
Pyxis was not loaded with amlodipine. Other RN looking again before contacting pharmacy.

## 2022-10-21 NOTE — Progress Notes (Signed)
PHARMACIST - PHYSICIAN COMMUNICATION  CONCERNING:  Enoxaparin (Lovenox) for DVT Prophylaxis    RECOMMENDATION: Patient was prescribed enoxaprin 40mg  q24 hours for VTE prophylaxis.   Filed Weights   10/21/22 1014  Weight: 113.9 kg (251 lb 1.7 oz)    Body mass index is 47.45 kg/m.  Estimated Creatinine Clearance: 59.6 mL/min (A) (by C-G formula based on SCr of 1.5 mg/dL (H)).   Based on [facility] policy patient is candidate for enoxaparin 0.5mg /kg TBW SQ every 24 hours based on BMI being >30.  DESCRIPTION: Pharmacy has adjusted enoxaparin dose per [facility] policy.  Patient is now receiving enoxaparin 57.5 mg every 24 hours    [facility], PharmD Clinical Pharmacist  10/21/2022 2:38 PM

## 2022-10-21 NOTE — Progress Notes (Signed)
Charge nurse received report on patient from ED nurse and relayed report to this nurse. Patient arrived to unit at approximately 1840 on insulin gtt and D5LR at , no skin issues notes, patient placed on cardiac monitoring.  Patient was hypertensive and given prn labetolol and blood sugar and endo tool documented. Report given to oncoming shift.

## 2022-10-21 NOTE — Assessment & Plan Note (Signed)
Patient has a long-term history of at HS with current sinus tracts in her bilateral axilla, bilateral breasts and her rectal region.  On examination, the only area that is having active drainage and induration is the right gluteal region.  Tenderness to palpation.  -Start doxycycline.  Will start with IV due to difficulty tolerating p.o. intake and plan to transition to oral when able

## 2022-10-21 NOTE — Assessment & Plan Note (Addendum)
Patient presenting with nausea and vomiting with elevated CBG, anion gap of 18 and elevated beta hydroxy acid of 2.8 consistent with DKA.  Although bicarb is within normal limits, this may be secondary to respiratory compensation.  Etiology likely secondary to recent change in medication (patient was taken off Lantus approximately 4 weeks ago) and dietary noncompliance.  A1c last month notes poor control at 12.3.  Weaned off of insulin drip and starting home Lantus plus sliding scale.

## 2022-10-21 NOTE — H&P (Signed)
History and Physical    Patient: Theresa Malone DOB: March 08, 1981 DOA: 10/21/2022 DOS: the patient was seen and examined on 10/21/2022 PCP: Ethelda Chick, MD  Patient coming from: Home  Chief Complaint:  Chief Complaint  Patient presents with   Emesis   HPI: Theresa Malone is a 41 y.o. female with medical history significant of type 2 diabetes, hypertension, iron deficiency anemia, hidradenitis suppurativa, who presents to the ED with complaints of nausea and vomiting.  Mrs. Printy states that on Friday, she had a large thanksgiving meal with her family.  Several hours later, she developed sudden onset nausea with vomiting.  Since then, she has been vomiting consistently throughout the day and has been unable to tolerate any p.o. intake.  In the last day, she has noticed that there have been small amounts of bright red blood in her emesis.  She denies coffee-ground emesis or melena.  She denies any fever, chills, chest pain, shortness of breath, palpitations.  She has been experiencing epigastric abdominal pain that she feels is from her did episodes of vomiting.  In addition, patient states she has a history of hidradenitis suppurativa with continued drainage from her bilateral axilla, breasts and peri-rectum.  She has never sought care for her HS.  Per chart review, patient presented yesterday with complaints of nausea and vomiting.  At that time, admission was recommended for treatment of hyperglycemic crisis, however patient declined.  ED course: On arrival to the ED, patient was hypertensive at 213/132 with heart rate of 112.  She was afebrile 97.5.  She was saturating at 97% on room air.  Initial workup remarkable for CBG of 667, creatinine of 1.5, bicarb of 24 with anion gap of 18 and WBC of 14.5.  Beta hydroxy acid elevated at 2.8.  Patient started on insulin infusion and [facility] contacted for admission.  Review of Systems: As mentioned in the history of present  illness. All other systems reviewed and are negative.  Past Medical History:  Diagnosis Date   Anxiety    panic attacks   Cellulitis    Depression    Diabetes mellitus    Gout    Hidradenitis suppurativa    Hypertension    Obesity    Venous stasis    Venous stasis dermatitis    Past Surgical History:  Procedure Laterality Date   CESAREAN SECTION     C/S x 2   CESAREAN SECTION N/A 01/24/2015   Procedure: CESAREAN SECTION;  Surgeon: Catalina Antigua, MD;  Location: [facility];  Service: Obstetrics;  Laterality: N/A;   TUBAL LIGATION     Social History:  reports that she has never smoked. She has never used smokeless tobacco. She reports that she does not currently use alcohol. She reports that she does not use drugs.  Allergies  Allergen Reactions   Sulfa Antibiotics Hives   Lisinopril Cough    cough    Family History  Problem Relation Age of Onset   Diabetes Paternal Grandfather    Cancer Paternal Grandfather        liver   Cancer Paternal Grandmother        liver   Hypertension Father    Diabetes Mother    Hypertension Mother    Diabetes Maternal Uncle    Diabetes Maternal Grandmother    Cancer Maternal Grandfather    Diabetes Daughter        boarderline    Asthma Daughter    Bronchitis Daughter  Bronchitis Son    Bronchitis Daughter     Prior to Admission medications   Medication Sig Start Date End Date Taking? Authorizing Provider  amLODipine (NORVASC) 5 MG tablet Take 1 tablet (5 mg total) by mouth daily. 09/14/22   Amin, Loura Halt, MD  BD PEN NEEDLE MICRO U/F 32G X 6 MM MISC Inject into the skin daily. 08/27/22   [provider]  COLCRYS 0.6 MG tablet Take 0.6 mg by mouth daily. 09/22/22   [provider]  Continuous Blood Gluc Receiver (FREESTYLE LIBRE 2 READER) DEVI USE TO CHECK GLUCOSE AS DIRECTED 04/25/22   [provider]  Continuous Blood Gluc Sensor (FREESTYLE LIBRE 2 SENSOR) MISC USE AS DIRECTED TO CHECK BLOOD SUGAR AND  CHANGE EVERY 14 DAYS 09/17/22   [provider]  Dextromethorphan-guaiFENesin 10-100 MG/5ML liquid Take 10 mLs by mouth every 6 (six) hours as needed. 08/21/22   [provider]  doxycycline (VIBRAMYCIN) 100 MG capsule Take 1 capsule (100 mg total) by mouth 2 (two) times daily. 10/19/22 01/17/23  Immordino, Jeannett Senior, FNP  escitalopram (LEXAPRO) 20 MG tablet Take 40 mg by mouth daily. 08/27/22   [provider]  gabapentin (NEURONTIN) 300 MG capsule Take 300 mg by mouth 2 (two) times daily. 09/22/22   [provider]  guaifenesin (HUMIBID E) 400 MG TABS tablet Take 400 mg by mouth 2 (two) times daily. 08/22/22   [provider]  hydrOXYzine (ATARAX) 50 MG tablet Take 50 mg by mouth 3 (three) times daily as needed for anxiety.    [provider]  insulin aspart (NOVOLOG FLEXPEN) 100 UNIT/ML FlexPen CBG 70 - 120: 0 units CBG 121 - 150: 1 unit CBG 151 - 200: 2 units CBG 201 - 250: 3 units CBG 251 - 300: 5 units CBG 301 - 350: 7 units CBG 351 - 400: 9 units CBG > 400: call MD 09/14/22   Stephania Fragmin Chirag, MD  insulin glargine, 2 Unit Dial, (TOUJEO MAX SOLOSTAR) 300 UNIT/ML Solostar Pen Inject 80 Units into the skin daily.    [provider]  losartan (COZAAR) 25 MG tablet Take 1 tablet (25 mg total) by mouth daily. 09/10/22   Lurene Shadow, MD  metFORMIN (GLUCOPHAGE) 1000 MG tablet Take 1,000 mg by mouth 2 (two) times daily with a meal.    [provider]  metoCLOPramide (REGLAN) 10 MG tablet Take 1 tablet (10 mg total) by mouth every 8 (eight) hours as needed for nausea. 09/14/22   Amin, Loura Halt, MD  pantoprazole (PROTONIX) 40 MG tablet Take 1 tablet (40 mg total) by mouth 2 (two) times daily before a meal. 09/14/22   Amin, Loura Halt, MD  clonazepam (KLONOPIN) 0.125 MG disintegrating tablet Take 1 tablet (0.125 mg total) by mouth 2 (two) times daily. Patient not taking: Reported on 05/19/2019 04/15/19 05/21/19  Catarina Hartshorn, MD    Physical Exam: Vitals:   10/21/22 1026 10/21/22 1230 10/21/22 1300 10/21/22 1446  BP: (!) 213/132 (!) 221/117 (!) 220/113   Pulse:  (!) 104 (!) 107   Resp:   (!) 24   Temp:    98.7 F (37.1 C)  TempSrc:    Oral  SpO2:  98% 99%   Weight:      Height:       Physical Exam Vitals and nursing note reviewed.  Constitutional:      General: She is not in acute distress.    Appearance: She is obese. She is ill-appearing. She is  not toxic-appearing.  HENT:     Head: Normocephalic and atraumatic.     Mouth/Throat:     Mouth: Mucous membranes are moist.     Pharynx: Oropharynx is clear.  Eyes:     Conjunctiva/sclera: Conjunctivae normal.     Pupils: Pupils are equal, round, and reactive to light.  Cardiovascular:     Rate and Rhythm: Regular rhythm. Tachycardia present.     Heart sounds: No murmur heard.    No gallop.  Pulmonary:     Effort: Pulmonary effort is normal. No respiratory distress.     Breath sounds: Normal breath sounds. No wheezing or rales.  Abdominal:     General: Bowel sounds are decreased.     Palpations: Abdomen is soft.     Tenderness: There is abdominal tenderness in the epigastric area. There is no guarding.  Musculoskeletal:     Cervical back: Neck supple.     Right lower leg: No edema.     Left lower leg: No edema.  Skin:    General: Skin is warm and dry.     Comments: Bilateral axilla with nondraining sinus tracts with minimal induration.  Bilateral inferior aspects of breasts with healed sinus tracts present.  Right gluteal region with drainage and induration (indurated area approximately 4 to 5 cm) with tenderness to palpation.  Neurological:     General: No focal deficit present.     Mental Status: She is alert and oriented to person, place, and time. Mental status is at baseline.  Psychiatric:        Mood and Affect: Mood normal.        Behavior: Behavior normal.    Data Reviewed: CBC with WBC of 14.5, hemoglobin of 11.4, MCV of 80, platelets  of 523.  BMP with sodium of 132, glucose of 667, BUN of 28, creatinine of 1.5, anion gap of 18 and bicarb of 24.  Magnesium within normal limits.  Phosphorus elevated at 5.3.  Beta hydroxy acid elevated at 2.8.  VBG with pCO2 of 41.  EKG personally reviewed.  Sinus rhythm with tachycardia 101.  Results are pending, will review when available.  Assessment and Plan: * DKA (diabetic ketoacidosis) (HCC) Patient presenting with nausea and vomiting with elevated CBG, anion gap of 18 and elevated beta hydroxy acid of 2.8 consistent with DKA.  Although bicarb is within normal limits, this may be secondary to respiratory compensation.  Etiology likely secondary to recent change in medication (patient was taken off Lantus approximately 4 weeks ago) and dietary noncompliance.   - DKA protocol - Continue maintenance fluids with potassium supplementation - BMP every 4 hours and BHA every 8 hours - Given nausea has been unresponsive to Zofran, Reglan and Phenergan, will try Ativan - Dilaudid as needed for abdominal pain  Hypertensive urgency Likely in the setting of inability to tolerate home medications and uncontrolled vomiting and pain.  - Restart home amlodipine - Hold home losartan given AKI - Labetalol as needed  Acute renal failure superimposed on stage 3a chronic kidney disease (HCC) Due to hypovolemia in the setting of DKA.  - Repeat BMP in the a.m. - IV fluid resuscitation - Hold home nephrotoxic agents  Hematemesis Minimal hematemesis and vomit after approximately 48 hours of vomiting likely Mallory-Weiss.  CBC with stable hemoglobin.  - CBC twice daily - Protonix 40 mg IV twice daily - Management of nausea as noted above - If no resolution with cessation of vomiting or decrease in hemoglobin, will consult  GI  Hidradenitis suppurativa Patient has a long-term history of at HS with current sinus tracts in her bilateral axilla, bilateral breasts and her rectal region.  On  examination, the only area that is having active drainage and induration is the right gluteal region.  Tenderness to palpation.  -Start doxycycline.  Will start with IV due to difficulty tolerating p.o. intake and plan to transition to oral when able  Advance Care Planning:   Code Status: Full Code   Consults: None  Family Communication: None  Severity of Illness: The appropriate patient status for this patient is INPATIENT. Inpatient status is judged to be reasonable and necessary in order to provide the required intensity of service to ensure the patient's safety. The patient's presenting symptoms, physical exam findings, and initial radiographic and laboratory data in the context of their chronic comorbidities is felt to place them at high risk for further clinical deterioration. Furthermore, it is not anticipated that the patient will be medically stable for discharge from the hospital within 2 midnights of admission.   * I certify that at the point of admission it is my clinical judgment that the patient will require inpatient hospital care spanning beyond 2 midnights from the point of admission due to high intensity of service, high risk for further deterioration and high frequency of surveillance required.*  Author: Verdene Lennert, MD 10/21/2022 2:52 PM  For on call review [email or web address].

## 2022-10-21 NOTE — ED Notes (Signed)
Advised nurse that patient has ready bed 

## 2022-10-21 NOTE — ED Provider Notes (Addendum)
[facility] Provider Note    Event Date/Time   First MD Initiated Contact with Patient 10/21/22 1216     (approximate)   History   Emesis   HPI  Theresa Malone is a 41 y.o. female   Past medical history of type II diabetic, hypertension, weighted BMI, depression and anxiety, gout, who presents to the emergency department with nausea and vomiting and hyperglycemia.  She states her symptoms started on Friday and worsened throughout the weekend, has had similar occurrences in the past.  She denies fever or chills or respiratory infectious symptoms and denies dysuria.  She has been compliant with her medications but did not take any of her medications today due to her symptoms.  He had multiple prior emergency department visits throughout this last month for similar, most recently yesterday evening when she presented for nausea vomiting and abdominal pain and work-up showed hyperglycemia but no elevated anion gap and was given fluids and nausea medications but then eloped from the emergency department.  History was obtained via the patient.  I also reviewed external medical notes including emergency department visit yesterday denoting the above.      Physical Exam   Triage Vital Signs: ED Triage Vitals  Enc Vitals Group     BP 10/21/22 1026 (!) 213/132     Pulse Rate 10/21/22 1022 (!) 112     Resp 10/21/22 1022 20     Temp 10/21/22 1022 (!) 97.5 F (36.4 C)     Temp src --      SpO2 10/21/22 1022 97 %     Weight 10/21/22 1014 251 lb 1.7 oz (113.9 kg)     Height 10/21/22 1014 5\' 1"  (1.549 m)     Head Circumference --      Peak Flow --      Pain Score 10/21/22 1023 10     Pain Loc --      Pain Edu? --      Excl. in GC? --     Most recent vital signs: Vitals:   10/21/22 1026 10/21/22 1230  BP: (!) 213/132 (!) 221/117  Pulse:  (!) 104  Resp:    Temp:    SpO2:  98%    General: Awake, no distress.  CV:  Good peripheral perfusion.  Hypertensive  200/100 and tachycardic in the low 100s. Resp:  Normal effort.  No respiratory distress.  Lungs clear. Abd:  No distention.  Abdomen is soft and nontender with no rigidity or guarding, Other:  Awake alert oriented and pleasant, nontoxic-appearing.   ED Results / Procedures / Treatments   Labs (all labs ordered are listed, but only abnormal results are displayed) Labs Reviewed  BASIC METABOLIC PANEL - Abnormal; Notable for the following components:      Result Value   Sodium 132 (*)    Chloride 90 (*)    Glucose, Bld 667 (*)    BUN 28 (*)    Creatinine, Ser 1.50 (*)    GFR, Estimated 45 (*)    Anion gap 18 (*)    All other components within normal limits  CBC - Abnormal; Notable for the following components:   WBC 14.5 (*)    Hemoglobin 11.4 (*)    HCT 35.5 (*)    MCH 25.7 (*)    RDW 15.9 (*)    Platelets 523 (*)    All other components within normal limits  BETA-HYDROXYBUTYRIC ACID - Abnormal; Notable for the following  components:   Beta-Hydroxybutyric Acid 2.81 (*)    All other components within normal limits  BLOOD GAS, VENOUS - Abnormal; Notable for the following components:   pCO2, Ven 41 (*)    All other components within normal limits  CBG MONITORING, ED - Abnormal; Notable for the following components:   Glucose-Capillary 563 (*)    All other components within normal limits  URINALYSIS, ROUTINE W REFLEX MICROSCOPIC  CBG MONITORING, ED  POC URINE PREG, ED     I reviewed labs and they are notable for hyperglycemia 667, creatinine is 1.5 and an anion gap is elevated at 18.  Her pH is normal.  She has a white blood cell count 14.5.  EKG  ED ECG REPORT I, Lucillie Garfinkel, the attending physician, personally viewed and interpreted this ECG.   Date: 10/21/2022  EKG Time: 1301  Rate: 101  Rhythm: sinus tachycardia  Axis: nl  Intervals:none  ST&T Change: No acute ischemic changes   PROCEDURES:  Critical Care performed: Yes, see critical care procedure  note(s)  .Critical Care  Performed by: Lucillie Garfinkel, MD Authorized by: Lucillie Garfinkel, MD   Critical care provider statement:    Critical care time (minutes):  30   Critical care was necessary to treat or prevent imminent or life-threatening deterioration of the following conditions:  Endocrine crisis   Critical care was time spent personally by me on the following activities:  Development of treatment plan with patient or surrogate, discussions with consultants, evaluation of patient's response to treatment, examination of patient, ordering and review of laboratory studies, ordering and review of radiographic studies, ordering and performing treatments and interventions, pulse oximetry, re-evaluation of patient's condition and review of [facility]: Medications  promethazine (PHENERGAN) 12.5 mg in sodium chloride 0.9 % 50 mL IVPB (12.5 mg Intravenous New Bag/Given 10/21/22 1225)  amLODipine (NORVASC) tablet 5 mg (has no administration in time range)  losartan (COZAAR) tablet 25 mg (25 mg Oral Given 10/21/22 1253)  dextrose 5 % in lactated ringers infusion (has no administration in time range)  dextrose 50 % solution 0-50 mL (has no administration in time range)  insulin regular, human (MYXREDLIN) 100 units/ 100 mL infusion (has no administration in time range)  0.9 % NaCl with KCl 20 mEq/ L  infusion (has no administration in time range)  sodium chloride 0.9 % bolus 1,000 mL (0 mLs Intravenous Stopped 10/21/22 1201)  ondansetron (ZOFRAN) injection 4 mg (4 mg Intravenous Given 10/21/22 1105)  sodium chloride 0.9 % bolus 1,000 mL (1,000 mLs Intravenous New Bag/Given 10/21/22 1201)  metoCLOPramide (REGLAN) injection 10 mg (10 mg Intravenous Given 10/21/22 1305)  acetaminophen (TYLENOL) tablet 650 mg (650 mg Oral Given 10/21/22 1305)    Consultants:  I spoke with hospitalist for admission and regarding care plan for this patient.   IMPRESSION / MDM / ASSESSMENT  AND PLAN / ED COURSE  I reviewed the triage vital signs and the nursing notes.                              Differential diagnosis includes, but is not limited to, hyperglycemic crisis, DKA, HHS, electrolyte disturbance, AKI, hypertensive crisis, gastroparesis, intra-abdominal infection or obstruction   The patient is on the cardiac monitor to evaluate for evidence of arrhythmia and/or significant heart rate changes.  MDM: This is a patient with type 2 diabetes who presents with hyperglycemia and intractable  nausea and vomiting with similar presentations within the last several weeks.  Today she is hyperglycemic in the 600s with elevated anion gap.  Her nausea and vomiting has been refractory to several medications.  Currently her pH is normal, will check beta hydroxybutyrate and her mental status is normal.  Will initiate insulin infusion for hyperglycemic crisis, give IV crystalloid infusions starting with 2 L in the emergency department.  I will give her home antihypertensives which she did not take this morning.  She will be admitted for intractable nausea and vomiting and hyperglycemic crisis, abdomen exam is benign and most likely due to her hyperglycemia and an element of gastroparesis rather than intra-abdominal infection, defer imaging  Hydroxybutyrate was elevated 2.2, anion gap elevated 18 and hyperglycemic 600+.  Her pH was normal, however.  Consider hyperglyemia w starvation ketosis? We will treat with DKA protocol and Endo tool insulin infusion, initial potassium was 4.3 so will administer potassium/normal saline infusion maintenance and admit to hospitalist service.   Patient's presentation is most consistent with acute presentation with potential threat to life or bodily function.       FINAL CLINICAL IMPRESSION(S) / ED DIAGNOSES   Final diagnoses:  Nausea and vomiting, unspecified vomiting type  Hyperglycemia  Hypertension, unspecified type     Rx / DC Orders   ED  Discharge Orders     None        Note:  This document was prepared using Dragon voice recognition software and may include unintentional dictation errors.    Lucillie Garfinkel, MD 10/21/22 1308    Lucillie Garfinkel, MD 10/21/22 [id number]

## 2022-10-21 NOTE — Assessment & Plan Note (Addendum)
Likely in the setting of inability to tolerate home medications and uncontrolled vomiting and pain.  In addition, given persistent elevation of blood pressures, will check echocardiogram.  Have added additional medications also.  Blood pressures by 11/28 still elevated, but have been improving.

## 2022-10-21 NOTE — Assessment & Plan Note (Addendum)
Due to hypovolemia in the setting of DKA.  Resolved with IV fluids.

## 2022-10-22 DIAGNOSIS — N179 Acute kidney failure, unspecified: Secondary | ICD-10-CM | POA: Diagnosis not present

## 2022-10-22 DIAGNOSIS — Z6841 Body Mass Index (BMI) 40.0 and over, adult: Secondary | ICD-10-CM

## 2022-10-22 DIAGNOSIS — L732 Hidradenitis suppurativa: Secondary | ICD-10-CM | POA: Diagnosis not present

## 2022-10-22 DIAGNOSIS — E1110 Type 2 diabetes mellitus with ketoacidosis without coma: Secondary | ICD-10-CM | POA: Diagnosis not present

## 2022-10-22 LAB — BASIC METABOLIC PANEL
Anion gap: 10 (ref 5–15)
Anion gap: 10 (ref 5–15)
BUN: 20 mg/dL (ref 6–20)
BUN: 19 mg/dL (ref 6–20)
CO2: 25 mmol/L (ref 22–32)
CO2: 25 mmol/L (ref 22–32)
Calcium: 9.8 mg/dL (ref 8.9–10.3)
Calcium: 9.5 mg/dL (ref 8.9–10.3)
Chloride: 104 mmol/L (ref 98–111)
Chloride: 102 mmol/L (ref 98–111)
Creatinine, Ser: 1.11 mg/dL — ABNORMAL HIGH (ref 0.44–1.00)
Creatinine, Ser: 1.09 mg/dL — ABNORMAL HIGH (ref 0.44–1.00)
GFR, Estimated: >60 mL/min (ref >60)
GFR, Estimated: >60 mL/min (ref >60)
Glucose, Bld: 171 mg/dL — ABNORMAL HIGH (ref 70–99)
Glucose, Bld: 245 mg/dL — ABNORMAL HIGH (ref 70–99)
Potassium: 3.4 mmol/L — ABNORMAL LOW (ref 3.5–5.1)
Potassium: 3.4 mmol/L — ABNORMAL LOW (ref 3.5–5.1)
Sodium: 139 mmol/L (ref 135–145)
Sodium: 137 mmol/L (ref 135–145)

## 2022-10-22 LAB — GLUCOSE, CAPILLARY
Glucose-Capillary: 169 mg/dL — ABNORMAL HIGH (ref 70–99)
Glucose-Capillary: 170 mg/dL — ABNORMAL HIGH (ref 70–99)
Glucose-Capillary: 175 mg/dL — ABNORMAL HIGH (ref 70–99)
Glucose-Capillary: 176 mg/dL — ABNORMAL HIGH (ref 70–99)
Glucose-Capillary: 176 mg/dL — ABNORMAL HIGH (ref 70–99)
Glucose-Capillary: 181 mg/dL — ABNORMAL HIGH (ref 70–99)
Glucose-Capillary: 209 mg/dL — ABNORMAL HIGH (ref 70–99)
Glucose-Capillary: 241 mg/dL — ABNORMAL HIGH (ref 70–99)
Glucose-Capillary: 264 mg/dL — ABNORMAL HIGH (ref 70–99)
Glucose-Capillary: 294 mg/dL — ABNORMAL HIGH (ref 70–99)

## 2022-10-22 LAB — CBC WITH DIFFERENTIAL/PLATELET
Abs Immature Granulocytes: 0.14 10*3/uL — ABNORMAL HIGH (ref 0.00–0.07)
Basophils Absolute: 0 10*3/uL (ref 0.0–0.1)
Basophils Relative: 0 %
Eosinophils Absolute: 0 10*3/uL (ref 0.0–0.5)
Eosinophils Relative: 0 %
HCT: 31.1 % — ABNORMAL LOW (ref 36.0–46.0)
Hemoglobin: 10.1 g/dL — ABNORMAL LOW (ref 12.0–15.0)
Immature Granulocytes: 1 %
Lymphocytes Relative: 11 %
Lymphs Abs: 1.5 10*3/uL (ref 0.7–4.0)
MCH: 25.9 pg — ABNORMAL LOW (ref 26.0–34.0)
MCHC: 32.5 g/dL (ref 30.0–36.0)
MCV: 79.7 fL — ABNORMAL LOW (ref 80.0–100.0)
Monocytes Absolute: 0.9 10*3/uL (ref 0.1–1.0)
Monocytes Relative: 6 %
Neutro Abs: 11 10*3/uL — ABNORMAL HIGH (ref 1.7–7.7)
Neutrophils Relative %: 82 %
Platelets: 427 10*3/uL — ABNORMAL HIGH (ref 150–400)
RBC: 3.9 MIL/uL (ref 3.87–5.11)
RDW: 16.2 % — ABNORMAL HIGH (ref 11.5–15.5)
WBC: 13.4 10*3/uL — ABNORMAL HIGH (ref 4.0–10.5)
nRBC: 0 % (ref 0.0–0.2)

## 2022-10-22 LAB — MRSA NEXT GEN BY PCR, NASAL: MRSA by PCR Next Gen: NOT DETECTED

## 2022-10-22 LAB — POC URINE PREG, ED: Preg Test, Ur: NEGATIVE

## 2022-10-22 LAB — BRAIN NATRIURETIC PEPTIDE: B Natriuretic Peptide: 44.9 pg/mL (ref 0.0–100.0)

## 2022-10-22 LAB — BETA-HYDROXYBUTYRIC ACID: Beta-Hydroxybutyric Acid: 1.13 mmol/L — ABNORMAL HIGH (ref 0.05–0.27)

## 2022-10-22 MED ORDER — LOSARTAN POTASSIUM 25 MG PO TABS
25.0000 mg | ORAL_TABLET | Freq: Every day | ORAL | Status: DC
  Administered 2022-10-22 – 2022-10-24 (×3): 25 mg via ORAL
  Filled 2022-10-22 (×3): qty 1

## 2022-10-22 MED ORDER — GABAPENTIN 300 MG PO CAPS
300.0000 mg | ORAL_CAPSULE | Freq: Two times a day (BID) | ORAL | Status: DC
  Administered 2022-10-22 – 2022-10-24 (×5): 300 mg via ORAL
  Filled 2022-10-22 (×5): qty 1

## 2022-10-22 MED ORDER — CHLORHEXIDINE GLUCONATE CLOTH 2 % EX PADS
6.0000 | MEDICATED_PAD | Freq: Every day | CUTANEOUS | Status: DC
  Administered 2022-10-22 – 2022-10-24 (×2): 6 via TOPICAL

## 2022-10-22 MED ORDER — INSULIN ASPART 100 UNIT/ML IJ SOLN: 0.0000 [IU] | Freq: Three times a day (TID) | INTRAMUSCULAR | Status: DC

## 2022-10-22 MED ORDER — INSULIN ASPART 100 UNIT/ML IJ SOLN: 0.0000 [IU] | INTRAMUSCULAR | Status: DC

## 2022-10-22 MED ORDER — INSULIN ASPART 100 UNIT/ML IJ SOLN: 0.0000 [IU] | Freq: Every day | INTRAMUSCULAR | Status: DC

## 2022-10-22 MED ORDER — HYDRALAZINE HCL 20 MG/ML IJ SOLN
5.0000 mg | Freq: Once | INTRAMUSCULAR | Status: AC
  Administered 2022-10-22: 5 mg via INTRAVENOUS
  Filled 2022-10-22: qty 1

## 2022-10-22 MED ORDER — INSULIN ASPART 100 UNIT/ML IJ SOLN
0.0000 [IU] | INTRAMUSCULAR | Status: DC
  Administered 2022-10-22: 5 [IU] via SUBCUTANEOUS
  Administered 2022-10-22: 8 [IU] via SUBCUTANEOUS
  Filled 2022-10-22 (×2): qty 1

## 2022-10-22 MED ORDER — INSULIN GLARGINE-YFGN 100 UNIT/ML ~~LOC~~ SOLN
30.0000 [IU] | Freq: Once | SUBCUTANEOUS | Status: AC
  Administered 2022-10-22: 30 [IU] via SUBCUTANEOUS
  Filled 2022-10-22: qty 0.3

## 2022-10-22 MED ORDER — INSULIN ASPART 100 UNIT/ML IJ SOLN
0.0000 [IU] | Freq: Every day | INTRAMUSCULAR | Status: DC
  Administered 2022-10-22: 2 [IU] via SUBCUTANEOUS
  Filled 2022-10-22: qty 1

## 2022-10-22 MED ORDER — ORAL CARE MOUTH RINSE: 15.0000 mL | OROMUCOSAL | Status: DC | PRN

## 2022-10-22 MED ORDER — LACTATED RINGERS IV SOLN: INTRAVENOUS | Status: DC

## 2022-10-22 MED ORDER — INSULIN ASPART 100 UNIT/ML IJ SOLN
0.0000 [IU] | Freq: Three times a day (TID) | INTRAMUSCULAR | Status: DC
  Administered 2022-10-22: 11 [IU] via SUBCUTANEOUS
  Administered 2022-10-23: 7 [IU] via SUBCUTANEOUS
  Administered 2022-10-23 (×2): 4 [IU] via SUBCUTANEOUS
  Administered 2022-10-24: 3 [IU] via SUBCUTANEOUS
  Administered 2022-10-24: 4 [IU] via SUBCUTANEOUS
  Filled 2022-10-22 (×6): qty 1

## 2022-10-22 MED ORDER — FUROSEMIDE 10 MG/ML IJ SOLN
20.0000 mg | Freq: Once | INTRAMUSCULAR | Status: AC
  Administered 2022-10-22: 20 mg via INTRAVENOUS
  Filled 2022-10-22: qty 2

## 2022-10-22 MED ORDER — METOPROLOL TARTRATE 5 MG/5ML IV SOLN
5.0000 mg | INTRAVENOUS | Status: AC
  Administered 2022-10-22: 5 mg via INTRAVENOUS
  Filled 2022-10-22: qty 5

## 2022-10-22 MED ORDER — HYDRALAZINE HCL 50 MG PO TABS
50.0000 mg | ORAL_TABLET | Freq: Three times a day (TID) | ORAL | Status: DC
  Administered 2022-10-22 – 2022-10-24 (×7): 50 mg via ORAL
  Filled 2022-10-22 (×7): qty 1

## 2022-10-22 MED ORDER — INSULIN GLARGINE-YFGN 100 UNIT/ML ~~LOC~~ SOLN
30.0000 [IU] | Freq: Two times a day (BID) | SUBCUTANEOUS | Status: DC
  Administered 2022-10-22 – 2022-10-23 (×2): 30 [IU] via SUBCUTANEOUS
  Filled 2022-10-22 (×2): qty 0.3

## 2022-10-22 MED ORDER — METOCLOPRAMIDE HCL 5 MG/ML IJ SOLN
5.0000 mg | Freq: Three times a day (TID) | INTRAMUSCULAR | Status: DC
  Administered 2022-10-22 – 2022-10-23 (×2): 5 mg via INTRAVENOUS
  Filled 2022-10-22 (×2): qty 2

## 2022-10-22 MED ORDER — METOPROLOL TARTRATE 50 MG PO TABS
50.0000 mg | ORAL_TABLET | Freq: Two times a day (BID) | ORAL | Status: DC
  Administered 2022-10-22 – 2022-10-24 (×4): 50 mg via ORAL
  Filled 2022-10-22 (×4): qty 1

## 2022-10-22 MED ORDER — INSULIN ASPART 100 UNIT/ML IJ SOLN
2.0000 [IU] | Freq: Once | INTRAMUSCULAR | Status: AC
  Administered 2022-10-22: 2 [IU] via SUBCUTANEOUS
  Filled 2022-10-22: qty 1

## 2022-10-22 MED ORDER — HYDROXYZINE HCL 50 MG PO TABS
50.0000 mg | ORAL_TABLET | Freq: Three times a day (TID) | ORAL | Status: DC | PRN
  Administered 2022-10-23 – 2022-10-24 (×2): 50 mg via ORAL
  Filled 2022-10-22: qty 1
  Filled 2022-10-22: qty 2

## 2022-10-22 MED ORDER — HYDRALAZINE HCL 20 MG/ML IJ SOLN
10.0000 mg | Freq: Four times a day (QID) | INTRAMUSCULAR | Status: DC | PRN
  Administered 2022-10-23: 10 mg via INTRAVENOUS
  Filled 2022-10-22: qty 1

## 2022-10-22 MED ORDER — ESCITALOPRAM OXALATE 10 MG PO TABS
40.0000 mg | ORAL_TABLET | Freq: Every day | ORAL | Status: DC
  Administered 2022-10-23 – 2022-10-24 (×2): 40 mg via ORAL
  Filled 2022-10-22: qty 4
  Filled 2022-10-22 (×2): qty 2

## 2022-10-22 MED ORDER — INSULIN ASPART 100 UNIT/ML IJ SOLN: 3.0000 [IU] | Freq: Three times a day (TID) | INTRAMUSCULAR | Status: DC

## 2022-10-22 NOTE — TOC Initial Note (Signed)
Transition of Care [facility]) - Initial/Assessment Note    Patient Details  Name: Theresa Malone MRN: [id number] Date of Birth: May 30, 1981  Transition of Care [facility]) CM/SW Contact:    Allayne Butcher, RN Phone Number: 10/22/2022, 11:44 AM  Clinical Narrative:                  Transition of Care (TOC) Screening Note   Patient Details  Name: Theresa Malone Date of Birth: 05-24-1981   Transition of Care [facility]) CM/SW Contact:    Allayne Butcher, RN Phone Number: 10/22/2022, 11:44 AM    Transition of Care Department [facility]) has reviewed patient and no TOC needs have been identified at this time. We will continue to monitor patient advancement through interdisciplinary progression rounds. If new patient transition needs arise, please place a TOC consult.    Expected Discharge Plan: Home/Self Care Barriers to Discharge: Continued Medical Work up   Patient Goals and CMS Choice        Expected Discharge Plan and Services Expected Discharge Plan: Home/Self Care                                              Prior Living Arrangements/Services                       Activities of Daily Living      Permission Sought/Granted                  Emotional Assessment              Admission diagnosis:  DKA (diabetic ketoacidosis) (HCC) [E11.10] Hyperglycemia [R73.9] Hypertension, unspecified type [I10] Nausea and vomiting, unspecified vomiting type [R11.2] Patient Active Problem List   Diagnosis Date Noted   DKA (diabetic ketoacidosis) (HCC) 10/21/2022   Hypertensive urgency 10/21/2022   Hematemesis 10/21/2022   Intractable nausea and vomiting 09/12/2022   Nausea vomiting and diarrhea 09/10/2022   Acute renal failure superimposed on stage 3a chronic kidney disease (HCC) 09/10/2022   Type II diabetes mellitus with renal manifestations (HCC) 09/10/2022   Prolonged QT interval 09/10/2022   Depression with anxiety 09/10/2022   Leukocytosis  09/10/2022   Elevated lipase 09/10/2022   Cough 09/10/2022   Gastroenteritis 09/07/2022   Depression    Uncontrolled type 2 diabetes mellitus with hyperglycemia, with long-term current use of insulin (HCC)    Cyst of left ovary 07/21/2021   Microcytic anemia 04/17/2021   Encounter for gynecological examination with Papanicolaou smear of cervix 03/22/2021   Dysmenorrhea 12/15/2020   Encounter for IUD removal 12/15/2020   Sepsis without acute organ dysfunction (HCC)    Acute pyelonephritis 09/22/2020   Pyelonephritis 09/22/2020   Emesis, persistent 05/21/2019   Cellulitis    Intractable vomiting with nausea 04/13/2019   Acute respiratory alkalosis    Intractable vomiting    Chest pain    Cellulitis of right lower extremity 04/12/2019   Intractable abdominal pain 04/11/2019   Metabolic alkalosis 04/11/2019   Respiratory alkalosis 04/11/2019   Volume depletion 04/11/2019   Hypokalemia 04/11/2019   Hyponatremia 04/11/2019   Yeast vaginitis 03/13/2018   Essential hypertension 02/07/2016   Menorrhagia with regular cycle 02/07/2016   Frequent loose stools 02/07/2016   Status post cesarean section 01/25/2015   Hidradenitis axillaris 09/15/2014   Susceptible to varicella (non-immune), currently pregnant 07/24/2014  Hidradenitis suppurativa 07/20/2014   Morbid obesity with BMI of 45.0-49.9, adult (HCC) 03/03/2014   PCP:  Ethelda Chick, MD Pharmacy:   [facility] [address], [place] - [address] [place] [address] Phone: [phone] Fax: [phone]     Social Determinants of Health (SDOH) Interventions    Readmission Risk Interventions    10/22/2022   11:43 AM 09/13/2022    4:23 PM  Readmission Risk Prevention Plan  Transportation Screening Complete Complete  PCP or Specialist Appt within 5-7 Days  Complete  PCP or Specialist Appt within 3-5 Days Complete   Home Care Screening  Complete  Medication Review (RN CM)  Complete  HRI  or Home Care Consult Not Complete   HRI or Home Care Consult comments NA- patient independent   Social Work Consult for Recovery Care Planning/Counseling Not Complete   SW consult not completed comments NA   Palliative Care Screening Not Applicable   Medication Review [employer]) Referral to Pharmacy

## 2022-10-22 NOTE — Progress Notes (Signed)
       CROSS COVER NOTE  NAME: TOREE EDLING MRN: [id number] DOB : 11-30-1980 ATTENDING PHYSICIAN: Verdene Lennert, MD    Date of Service   10/22/2022   HPI/Events of Note   Medication request received for elevated BP 193/106 despite PRN Labetalol given x3 doses this shift.   Interventions   Assessment/Plan:  5 mg IV Hydralazine x1     This document was prepared using Dragon voice recognition software and may include unintentional dictation errors.  Bishop Limbo DNP, MBA, FNP-BC Nurse Practitioner [facility] Pager [phone]

## 2022-10-22 NOTE — Assessment & Plan Note (Signed)
Meets criteria with BMI greater than 40 

## 2022-10-22 NOTE — Progress Notes (Signed)
[facility] Progress Note  Patient: Theresa Malone    [id number]  DOA: 10/21/2022    Date of Service: the patient was seen and examined on 10/22/2022  Brief hospital course: 41 year old female with diabetes mellitus type 2, hypertension and morbid obesity who presented to the emergency room on 11/26 after acute onset nausea and vomiting and patient found to have type II DKA.  Admitted to the hospital service and started on IV fluids and insulin drip.  Assessment and Plan: Assessment and Plan: * DKA (diabetic ketoacidosis) ([place]) Patient presenting with nausea and vomiting with elevated CBG, anion gap of 18 and elevated beta hydroxy acid of 2.8 consistent with DKA.  Although bicarb is within normal limits, this may be secondary to respiratory compensation.  Etiology likely secondary to recent change in medication (patient was taken off Lantus approximately 4 weeks ago) and dietary noncompliance.  A1c last month notes poor control at 12.3.  Weaned off of insulin drip and starting home Lantus plus sliding scale.  Hypertensive urgency Likely in the setting of inability to tolerate home medications and uncontrolled vomiting and pain.  In addition, given persistent elevation of blood pressures, will check BNP and echocardiogram.  Have added additional medications also  Acute renal failure superimposed on stage 3a chronic kidney disease (HCC)-resolved as of 10/22/2022 Due to hypovolemia in the setting of DKA.  Resolved with IV fluids.  Hematemesis Minimal hematemesis and vomit after approximately 48 hours of vomiting likely Mallory-Weiss.  CBC with stable hemoglobin.  Some persistent nausea likely secondary to gastroparesis.  Continue PPI.  Hidradenitis suppurativa Patient has a long-term history of at HS with current sinus tracts in her bilateral axilla, bilateral breasts and her rectal region.  On examination, the only area that is having active drainage and induration is the right gluteal  region.  Tenderness to palpation.  -Start doxycycline.  Will start with IV due to difficulty tolerating p.o. intake and plan to transition to oral when able  Morbid obesity with BMI of 45.0-49.9, adult ([place]) Meets criteria with BMI greater than 40       Body mass index is 47.45 kg/m.        Consultants: None  Procedures: None  Antimicrobials: None  Code Status: Full code   Subjective: Patient feels very sleepy (had just received Ativan)  Objective: Noted elevated blood pressures Vitals:   10/22/22 1300 10/22/22 1415  BP: (!) 185/110 (!) 173/110  Pulse: 95 97  Resp: (!) 26 13  Temp:    SpO2: 98% 100%    Intake/Output Summary (Last 24 hours) at 10/22/2022 1550 Last data filed at 10/22/2022 [id number] Gross per 24 hour  Intake 4530.1 ml  Output 1250 ml  Net 3280.1 ml   Filed Weights   10/21/22 1014  Weight: 113.9 kg   Body mass index is 47.45 kg/m.  Exam:  General: Drowsy, no acute distress HEENT: Normocephalic, atraumatic, mucous membranes slightly dry Cardiovascular: Regular rate and rhythm, S1-S2, borderline tachycardia Respiratory: Clear to auscultation bilaterally Abdomen: Soft, obese, nontender, positive bowel sounds Musculoskeletal: No clubbing or cyanosis or edema   Skin: no skin breaks, tears or lesions Psychiatry: Appropriate, no evidence of psychoses Neurology: No focal deficits  Data Reviewed: White blood cell count of 13.4 with hemoglobin of 10.1  Disposition:  Status is: Inpatient Remains inpatient appropriate because:  -Ability to take p.o. -Stabilization of blood pressure -Stabilization of blood sugar    Anticipated discharge date: 11/29  Family Communication: Will call family DVT Prophylaxis: Lovenox  Author: Hollice Espy ,MD 10/22/2022 3:50 PM  To reach On-call, see care teams to locate the attending and reach out via [email or web address]. Between 7PM-7AM, please contact night-coverage If you still have difficulty  reaching the attending provider, please page the [facility] (Director on Call) for [facility] on amion for assistance.

## 2022-10-22 NOTE — Hospital Course (Signed)
41 year old female with diabetes mellitus type 2, hypertension and morbid obesity who presented to the emergency room on 11/26 after acute onset nausea and vomiting and patient found to have type II DKA.  Admitted to the hospital service and started on IV fluids and insulin drip.  By 11/27, able to be weaned off of insulin drip however patient continues to have severe abdominal pain and nausea, felt to be secondary to gastroparesis.  She has been requesting large amounts of IV pain medication.

## 2022-10-22 NOTE — Inpatient Diabetes Management (Addendum)
Inpatient Diabetes Program Recommendations  AACE/ADA: New Consensus Statement on Inpatient Glycemic Control (2015)  Target Ranges:  Prepandial:   less than 140 mg/dL      Peak postprandial:   less than 180 mg/dL (1-2 hours)      Critically ill patients:  140 - 180 mg/dL   Lab Results  Component Value Date   GLUCAP 241 (H) 10/22/2022   HGBA1C 12.3 (H) 09/07/2022    Review of Glycemic Control  Diabetes history: DM2 Outpatient Diabetes medications: Discharged on 09/14/22 on Toujeo 80 units, Novolog correction scale + Metformin 1 gm bid. Current orders for Inpatient glycemic control: Semglee 30 units bid, Novolog 0-15 units q 4 hrs.  Inpatient Diabetes Program Recommendations:   Noted patient discharged on Toujeo 80 units qd, Novolog correction scale, + Metformin 1 gm bid per recommendations. Patient's insulin had been discontinued prior to this admission.  Agree with restarting insulin and planning for discharge on insulin.  -When eating, please consider: Add Novolog 5 units tid meal coverage if eats 50% meals  Spoke with patient @ bedside. Patient states she has been taking her Toujeo and Novolog insulin as prescribed except for one day prior to admission. Patient has not been taking Metformin due to frequent diarrhea symptoms when she takes it.  Thank you, Billy Fischer. Brittinee Risk, RN, MSN, CDE  Diabetes Coordinator Inpatient Glycemic Control Team Team Pager [phone] (8am-5pm) 10/22/2022 10:51 AM

## 2022-10-22 NOTE — Progress Notes (Signed)
       CROSS COVER NOTE  NAME: VALECIA BESKE MRN: [id number] DOB : 1981-11-05 ATTENDING PHYSICIAN: Verdene Lennert, MD    Notified by nursing they received ENDOTOOL alert that patient is to be transitioned off of insulin infusion.  Chart reviewed.  Gap is closed and beta-hydroxybutyrate level normalizing.    Patient not on scheduled basal insulin at home - insulin stopped ~4 weeks ago by PCP when started on Tradjenta. Will restart previous inpatient regimen during October 2023 admission. Semglee 30U BID and SSI. Will hold scheduled meal short acting insulin while NPO.  Carb Modified diet started.  Insulin infusion to be stopped 2 hrs after administration of basal insulin administration.     Initiating accuchecks QAC and QHS with sliding scale insulin.   Diabetes coordinator consulted by admitting physician, appreciate their assistance with management.   This document was prepared using Dragon voice recognition software and may include unintentional dictation errors.  Bishop Limbo DNP, MBA, FNP-BC Nurse Practitioner [facility] Pager [phone]

## 2022-10-23 ENCOUNTER — Inpatient Hospital Stay
Admit: 2022-10-23 | Discharge: 2022-10-23 | Disposition: A | Discharge status: Home / Self Care | Payer: Medicaid Other | Attending: Internal Medicine | Admitting: Internal Medicine

## 2022-10-23 ENCOUNTER — Inpatient Hospital Stay (HOSPITAL_COMMUNITY)
Admit: 2022-10-23 | Discharge: 2022-10-23 | Disposition: A | Discharge status: Home / Self Care | Payer: Medicaid Other | Attending: Internal Medicine | Admitting: Internal Medicine

## 2022-10-23 ENCOUNTER — Inpatient Hospital Stay: Payer: Medicaid Other

## 2022-10-23 DIAGNOSIS — I5031 Acute diastolic (congestive) heart failure: Secondary | ICD-10-CM | POA: Diagnosis not present

## 2022-10-23 DIAGNOSIS — E1110 Type 2 diabetes mellitus with ketoacidosis without coma: Secondary | ICD-10-CM | POA: Diagnosis not present

## 2022-10-23 DIAGNOSIS — E1165 Type 2 diabetes mellitus with hyperglycemia: Secondary | ICD-10-CM

## 2022-10-23 DIAGNOSIS — Z794 Long term (current) use of insulin: Secondary | ICD-10-CM

## 2022-10-23 DIAGNOSIS — F419 Anxiety disorder, unspecified: Secondary | ICD-10-CM | POA: Diagnosis present

## 2022-10-23 DIAGNOSIS — K3184 Gastroparesis: Secondary | ICD-10-CM | POA: Diagnosis not present

## 2022-10-23 LAB — ECHOCARDIOGRAM COMPLETE
AR max vel: 2.98 cm2
AV Area VTI: 3.07 cm2
AV Area mean vel: 2.96 cm2
AV Mean grad: 8 mmHg
AV Peak grad: 12.3 mmHg
Ao pk vel: 1.75 m/s
Area-P 1/2: 3.03 cm2
Height: 61 in
S' Lateral: 2.3 cm
Weight: 4017.66 oz

## 2022-10-23 LAB — CBC
HCT: 32.6 % — ABNORMAL LOW (ref 36.0–46.0)
Hemoglobin: 10.2 g/dL — ABNORMAL LOW (ref 12.0–15.0)
MCH: 25.7 pg — ABNORMAL LOW (ref 26.0–34.0)
MCHC: 31.3 g/dL (ref 30.0–36.0)
MCV: 82.1 fL (ref 80.0–100.0)
Platelets: 429 10*3/uL — ABNORMAL HIGH (ref 150–400)
RBC: 3.97 MIL/uL (ref 3.87–5.11)
RDW: 16 % — ABNORMAL HIGH (ref 11.5–15.5)
WBC: 10.6 10*3/uL — ABNORMAL HIGH (ref 4.0–10.5)
nRBC: 0 % (ref 0.0–0.2)

## 2022-10-23 LAB — GLUCOSE, CAPILLARY
Glucose-Capillary: 209 mg/dL — ABNORMAL HIGH (ref 70–99)
Glucose-Capillary: 229 mg/dL — ABNORMAL HIGH (ref 70–99)
Glucose-Capillary: 167 mg/dL — ABNORMAL HIGH (ref 70–99)
Glucose-Capillary: 186 mg/dL — ABNORMAL HIGH (ref 70–99)
Glucose-Capillary: 184 mg/dL — ABNORMAL HIGH (ref 70–99)

## 2022-10-23 LAB — BASIC METABOLIC PANEL
Anion gap: 9 (ref 5–15)
BUN: 22 mg/dL — ABNORMAL HIGH (ref 6–20)
CO2: 27 mmol/L (ref 22–32)
Calcium: 9.5 mg/dL (ref 8.9–10.3)
Chloride: 102 mmol/L (ref 98–111)
Creatinine, Ser: 1.21 mg/dL — ABNORMAL HIGH (ref 0.44–1.00)
GFR, Estimated: 59 mL/min — ABNORMAL LOW (ref >60)
Glucose, Bld: 213 mg/dL — ABNORMAL HIGH (ref 70–99)
Potassium: 3.5 mmol/L (ref 3.5–5.1)
Sodium: 138 mmol/L (ref 135–145)

## 2022-10-23 MED ORDER — PANTOPRAZOLE SODIUM 40 MG PO TBEC
40.0000 mg | DELAYED_RELEASE_TABLET | Freq: Two times a day (BID) | ORAL | Status: DC
  Administered 2022-10-23 – 2022-10-24 (×3): 40 mg via ORAL
  Filled 2022-10-23 (×3): qty 1

## 2022-10-23 MED ORDER — TRAZODONE HCL 50 MG PO TABS
50.0000 mg | ORAL_TABLET | Freq: Every day | ORAL | Status: DC
  Administered 2022-10-23: 50 mg via ORAL
  Filled 2022-10-23: qty 1

## 2022-10-23 MED ORDER — METOCLOPRAMIDE HCL 5 MG/ML IJ SOLN
10.0000 mg | Freq: Four times a day (QID) | INTRAMUSCULAR | Status: DC
  Administered 2022-10-23 – 2022-10-24 (×4): 10 mg via INTRAVENOUS
  Filled 2022-10-23 (×4): qty 2

## 2022-10-23 MED ORDER — SODIUM CHLORIDE 0.9 % IV SOLN
12.5000 mg | Freq: Four times a day (QID) | INTRAVENOUS | Status: DC | PRN
  Administered 2022-10-23 – 2022-10-24 (×2): 12.5 mg via INTRAVENOUS
  Filled 2022-10-23: qty 12.5
  Filled 2022-10-23: qty 0.5

## 2022-10-23 MED ORDER — DOXYCYCLINE HYCLATE 100 MG PO TABS
100.0000 mg | ORAL_TABLET | Freq: Two times a day (BID) | ORAL | Status: DC
  Administered 2022-10-23 – 2022-10-24 (×2): 100 mg via ORAL
  Filled 2022-10-23 (×2): qty 1

## 2022-10-23 MED ORDER — INSULIN GLARGINE-YFGN 100 UNIT/ML ~~LOC~~ SOLN
33.0000 [IU] | Freq: Two times a day (BID) | SUBCUTANEOUS | Status: DC
  Administered 2022-10-23 – 2022-10-24 (×2): 33 [IU] via SUBCUTANEOUS
  Filled 2022-10-23 (×4): qty 0.33

## 2022-10-23 MED ORDER — OXYCODONE HCL 5 MG PO TABS
5.0000 mg | ORAL_TABLET | Freq: Four times a day (QID) | ORAL | Status: DC | PRN
  Administered 2022-10-23 – 2022-10-24 (×4): 5 mg via ORAL
  Filled 2022-10-23 (×4): qty 1

## 2022-10-23 MED ORDER — METOCLOPRAMIDE HCL 5 MG/ML IJ SOLN
10.0000 mg | Freq: Three times a day (TID) | INTRAMUSCULAR | Status: DC
  Administered 2022-10-23: 10 mg via INTRAVENOUS
  Filled 2022-10-23: qty 2

## 2022-10-23 NOTE — Progress Notes (Signed)
PHARMACIST - PHYSICIAN COMMUNICATION  CONCERNING: Antibiotic IV to Oral Route Change Policy  RECOMMENDATION: This patient is receiving doxycycline by the intravenous route.  Based on criteria approved by the Pharmacy and Therapeutics Committee, the antibiotic(s) is/are being converted to the equivalent oral dose form(s).   DESCRIPTION: These criteria include: Patient being treated for a respiratory tract infection, urinary tract infection, cellulitis or clostridium difficile associated diarrhea if on metronidazole The patient is not neutropenic and does not exhibit a GI malabsorption state The patient is eating (either orally or via tube) and/or has been taking other orally administered medications for a least 24 hours The patient is improving clinically and has a Tmax < 100.5  If you have questions about this conversion, please contact the Pharmacy Department   [name] 10/23/22

## 2022-10-23 NOTE — Assessment & Plan Note (Signed)
As above.  Needing patient to eat.  Still very poorly controlled overall.

## 2022-10-23 NOTE — Assessment & Plan Note (Signed)
Try to wean away from pain medications.  Have increased Reglan and ordered refractory Phenergan.

## 2022-10-23 NOTE — Progress Notes (Signed)
*  PRELIMINARY RESULTS* Echocardiogram 2D Echocardiogram has been performed.  Cristela Blue 10/23/2022, 2:37 PM

## 2022-10-23 NOTE — Progress Notes (Signed)
PHARMACIST - PHYSICIAN COMMUNICATION  CONCERNING: IV to Oral Route Change Policy  RECOMMENDATION: This patient is receiving pantoprazole by the intravenous route.  Based on criteria approved by the Pharmacy and Therapeutics Committee, the intravenous medication(s) is/are being converted to the equivalent oral dose form(s).   DESCRIPTION: These criteria include: The patient is eating (either orally or via tube) and/or has been taking other orally administered medications for a least 24 hours The patient has no evidence of active gastrointestinal bleeding or impaired GI absorption (gastrectomy, short bowel, patient on TNA or NPO).  If you have questions about this conversion, please contact the Pharmacy Department   [name], [facility] 10/23/2022 8:01 AM

## 2022-10-23 NOTE — Progress Notes (Signed)
[facility] Progress Note  Patient: Theresa Malone    QBV:[id number]  DOA: 10/21/2022    Date of Service: the patient was seen and examined on 10/23/2022  Brief hospital course: 41 year old female with diabetes mellitus type 2, hypertension and morbid obesity who presented to the emergency room on 11/26 after acute onset nausea and vomiting and patient found to have type II DKA.  Admitted to the hospital service and started on IV fluids and insulin drip.  By 11/27, able to be weaned off of insulin drip however patient continues to have severe abdominal pain and nausea, felt to be secondary to gastroparesis.  She has been requesting large amounts of IV pain medication.  Assessment and Plan: Assessment and Plan: * DKA (diabetic ketoacidosis) (HCC)-resolved as of 10/23/2022 Patient presenting with nausea and vomiting with elevated CBG, anion gap of 18 and elevated beta hydroxy acid of 2.8 consistent with DKA.  Although bicarb is within normal limits, this may be secondary to respiratory compensation.  Etiology likely secondary to recent change in medication (patient was taken off Lantus approximately 4 weeks ago) and dietary noncompliance.  A1c last month notes poor control at 12.3.  Weaned off of insulin drip and starting home Lantus plus sliding scale.  Hypertensive urgency Likely in the setting of inability to tolerate home medications and uncontrolled vomiting and pain.  In addition, given persistent elevation of blood pressures, will check echocardiogram.  Have added additional medications also.  Blood pressures by 11/28 still elevated, but have been improving.  Acute renal failure superimposed on stage 3a chronic kidney disease (HCC)-resolved as of 10/22/2022 Due to hypovolemia in the setting of DKA.  Resolved with IV fluids.  Hematemesis Minimal hematemesis and vomit after approximately 48 hours of vomiting likely Mallory-Weiss.  CBC with stable hemoglobin.  Some persistent nausea  likely secondary to gastroparesis.  Continue PPI.  Gastroparesis Try to wean away from pain medications.  Have increased Reglan and ordered refractory Phenergan.  Uncontrolled diabetes mellitus with hyperglycemia, with long-term current use of insulin (HCC) As above.  Needing patient to eat.  Still very poorly controlled overall.  Hidradenitis suppurativa Patient has a long-term history of at HS with current sinus tracts in her bilateral axilla, bilateral breasts and her rectal region.  On examination, the only area that is having active drainage and induration is the right gluteal region.  Tenderness to palpation.  -Start doxycycline.  Will start with IV due to difficulty tolerating p.o. intake and plan to transition to oral when able  Anxiety Continue home Atarax.  Patient requesting trazodone at night which she says she is on, but not on home medication list.  Try to limit use of Ativan.  Morbid obesity with BMI of 45.0-49.9, adult (HCC) Meets criteria with BMI greater than 40       Body mass index is 47.45 kg/m.        Consultants: None  Procedures: None  Antimicrobials: None  Code Status: Full code   Subjective: Complains of abdominal pain and feeling anxious  Objective: Improving blood pressures Vitals:   10/23/22 1330 10/23/22 1553  BP: (!) 137/90 (!) 162/98  Pulse: 85 89  Resp: 18 18  Temp: 98 F (36.7 C) 98 F (36.7 C)  SpO2: 99% 97%    Intake/Output Summary (Last 24 hours) at 10/23/2022 1824 Last data filed at 10/23/2022 0951 Gross per 24 hour  Intake 533.45 ml  Output 700 ml  Net -166.55 ml   Filed Weights   10/21/22  1014  Weight: 113.9 kg   Body mass index is 47.45 kg/m.  Exam:  General: Oriented x 3, mild distress secondary to pain HEENT: Normocephalic, atraumatic, mucous membranes slightly dry Cardiovascular: Regular rate and rhythm, S1-S2, borderline tachycardia Respiratory: Clear to auscultation bilaterally Abdomen: Soft,  obese, nontender, few bowel sounds Musculoskeletal: No clubbing or cyanosis or edema   Skin: no skin breaks, tears or lesions Psychiatry: Appropriate, no evidence of psychoses Neurology: No focal deficits  Data Reviewed: White blood cell count of 10.6, creatinine of 1.21  Disposition:  Status is: Inpatient Remains inpatient appropriate because:  -Ability to take p.o. -Stabilization of blood pressure    Anticipated discharge date: 11/29  Family Communication: Will call family DVT Prophylaxis: Lovenox    Author: Hollice Espy ,MD 10/23/2022 6:24 PM  To reach On-call, see care teams to locate the attending and reach out via [email or web address]. Between 7PM-7AM, please contact night-coverage If you still have difficulty reaching the attending provider, please page the [facility] (Director on Call) for [facility] on amion for assistance.

## 2022-10-23 NOTE — Assessment & Plan Note (Signed)
Continue home Atarax.  Patient requesting trazodone at night which she says she is on, but not on home medication list.  Try to limit use of Ativan.

## 2022-10-24 DIAGNOSIS — E1110 Type 2 diabetes mellitus with ketoacidosis without coma: Secondary | ICD-10-CM | POA: Diagnosis not present

## 2022-10-24 DIAGNOSIS — I160 Hypertensive urgency: Secondary | ICD-10-CM | POA: Diagnosis not present

## 2022-10-24 DIAGNOSIS — L732 Hidradenitis suppurativa: Secondary | ICD-10-CM | POA: Diagnosis not present

## 2022-10-24 LAB — CBC
HCT: 40.1 % (ref 36.0–46.0)
Hemoglobin: 12.5 g/dL (ref 12.0–15.0)
MCH: 25.6 pg — ABNORMAL LOW (ref 26.0–34.0)
MCHC: 31.2 g/dL (ref 30.0–36.0)
MCV: 82 fL (ref 80.0–100.0)
Platelets: 428 10*3/uL — ABNORMAL HIGH (ref 150–400)
RBC: 4.89 MIL/uL (ref 3.87–5.11)
RDW: 16.1 % — ABNORMAL HIGH (ref 11.5–15.5)
WBC: 10.4 10*3/uL (ref 4.0–10.5)
nRBC: 0 % (ref 0.0–0.2)

## 2022-10-24 LAB — BASIC METABOLIC PANEL
Anion gap: 11 (ref 5–15)
BUN: 26 mg/dL — ABNORMAL HIGH (ref 6–20)
CO2: 26 mmol/L (ref 22–32)
Calcium: 9.8 mg/dL (ref 8.9–10.3)
Chloride: 103 mmol/L (ref 98–111)
Creatinine, Ser: 1.44 mg/dL — ABNORMAL HIGH (ref 0.44–1.00)
GFR, Estimated: 48 mL/min — ABNORMAL LOW (ref >60)
Glucose, Bld: 115 mg/dL — ABNORMAL HIGH (ref 70–99)
Potassium: 3.2 mmol/L — ABNORMAL LOW (ref 3.5–5.1)
Sodium: 140 mmol/L (ref 135–145)

## 2022-10-24 LAB — GLUCOSE, CAPILLARY
Glucose-Capillary: 181 mg/dL — ABNORMAL HIGH (ref 70–99)
Glucose-Capillary: 153 mg/dL — ABNORMAL HIGH (ref 70–99)

## 2022-10-24 MED ORDER — METOPROLOL TARTRATE 50 MG PO TABS: 50.0000 mg | ORAL_TABLET | Freq: Two times a day (BID) | ORAL | 1 refills | Status: DC

## 2022-10-24 MED ORDER — POTASSIUM CHLORIDE CRYS ER 20 MEQ PO TBCR
40.0000 meq | EXTENDED_RELEASE_TABLET | Freq: Once | ORAL | Status: AC
  Administered 2022-10-24: 40 meq via ORAL
  Filled 2022-10-24: qty 2

## 2022-10-24 MED ORDER — FUROSEMIDE 10 MG/ML IJ SOLN
20.0000 mg | Freq: Two times a day (BID) | INTRAMUSCULAR | Status: DC
  Administered 2022-10-24: 20 mg via INTRAVENOUS
  Filled 2022-10-24: qty 2

## 2022-10-24 MED ORDER — TOUJEO MAX SOLOSTAR 300 UNIT/ML ~~LOC~~ SOPN: 40.0000 [IU] | PEN_INJECTOR | Freq: Two times a day (BID) | SUBCUTANEOUS | 3 refills | Status: DC

## 2022-10-24 MED ORDER — HYDRALAZINE HCL 50 MG PO TABS: 100.0000 mg | ORAL_TABLET | Freq: Three times a day (TID) | ORAL | 4 refills | Status: DC

## 2022-10-24 NOTE — TOC CM/SW Note (Signed)
TOC left a message for the diabetes educator [phone]. Patient needs long term diabetes education in the community.

## 2022-10-24 NOTE — TOC CM/SW Note (Signed)
  Transition of Care (TOC) Screening Note   Patient Details  Name: Theresa Malone Date of Birth: 1980-12-24   Transition of Care [facility]) CM/SW Contact:    Tempie Hoist, LCSWA Phone Number: 10/24/2022, 3:39 PM    Transition of Care Department [facility]) has reviewed patient and no TOC needs have been identified at this time. We will continue to monitor patient advancement through interdisciplinary progression rounds. If new patient transition needs arise, please place a TOC consult.

## 2022-10-24 NOTE — Progress Notes (Signed)
Pt being discharged home, discharge instructions reviewed with pt, states understanding, pt with no complaints at discharge  °

## 2022-10-24 NOTE — Discharge Summary (Addendum)
Physician Discharge Summary   Patient: Theresa Malone MRN: [id number] DOB: Aug 09, 1981  Admit date:     10/21/2022  Discharge date: 10/24/22  Discharge Physician: Hollice Espy   PCP: Ethelda Chick, MD   Recommendations at discharge:   Patient given referral for psychiatry Patient advised not to return back to work for another week and a half until she has good control blood sugars and blood pressure at home Medication change: Lantus changed from 80 units once a day to 40 units twice a day New medication: Lopressor 50 mg p.o. twice daily New medication: Hydralazine 100 mg p.o. every 8 hours Follow-up with PCP in the next 2 weeks  Discharge Diagnoses: Active Problems:   Hypertensive urgency   Hematemesis   Gastroparesis   Uncontrolled diabetes mellitus with hyperglycemia, with long-term current use of insulin (HCC)   Hidradenitis suppurativa   Anxiety   Morbid obesity with BMI of 45.0-49.9, adult (HCC)  Principal Problem (Resolved):   DKA (diabetic ketoacidosis) (HCC) Resolved Problems:   Acute renal failure superimposed on stage 3a chronic kidney disease [facility])  Hospital Course: 41 year old female with diabetes mellitus type 2, hypertension and morbid obesity who presented to the emergency room on 11/26 after acute onset nausea and vomiting and patient found to have type II DKA.  Admitted to the hospital service and started on IV fluids and insulin drip.  By 11/27, able to be weaned off of insulin drip however patient continues to have severe abdominal pain and nausea, felt to be secondary to gastroparesis.  11/29, patient feeling somewhat better, tolerating mild p.o. and asking to be discharged home.  Assessment and Plan: * DKA (diabetic ketoacidosis) (HCC)-resolved as of 10/23/2022 Patient presenting with nausea and vomiting with elevated CBG, anion gap of 18 and elevated beta hydroxy acid of 2.8 consistent with DKA.  Although bicarb is within normal limits, this may  be secondary to respiratory compensation.  Etiology likely secondary to recent change in medication (patient was taken off Lantus approximately 4 weeks ago) and dietary noncompliance.  A1c last month notes poor control at 12.3.  Weaned off of insulin drip and starting home Lantus plus sliding scale.   Hypertensive urgency Likely in the setting of inability to tolerate home medications and uncontrolled vomiting and pain.  In addition, given persistent elevation of blood pressures, will check echocardiogram.  Have added additional medications also.  By day of discharge, blood pressures were somewhat better.  Had extensive discussion with patient about control of her blood pressure and taking her medications.  Discharged on new Lopressor and hydralazine along with her previous home medications.   Acute renal failure superimposed on stage 3a chronic kidney disease (HCC)-resolved as of 10/22/2022 Due to hypovolemia in the setting of DKA.  Resolved with IV fluids.   Hematemesis Minimal hematemesis and vomit after approximately 48 hours of vomiting likely Mallory-Weiss.  CBC with stable hemoglobin.  Some persistent nausea likely secondary to gastroparesis.  Continue PPI.   Gastroparesis Try to wean away from pain medications.  Have increased Reglan and ordered refractory Phenergan.   Uncontrolled diabetes mellitus with hyperglycemia, with long-term current use of insulin (HCC) As above.  Needing patient to eat.  Still very poorly controlled overall.  Had extensive discussion with patient about need for better control.  We talked about her A1c and we talked about the importance of checking her blood sugars regularly and making adjustments in her medicines.  She also might benefit from being on Lantus twice a day  so changed her 80 unit dose to 40 units twice a day   Hidradenitis suppurativa Patient has a long-term history of at HS with current sinus tracts in her bilateral axilla, bilateral breasts and her  rectal region.  On examination, the only area that is having active drainage and induration is the right gluteal region.  Tenderness to palpation.   -Start doxycycline.  Initially started with IV and then transition to p.o.   Anxiety Continue home Atarax.  Patient requesting trazodone at night which she says she is on, but not on home medication list.  Try to limit use of Ativan.   Morbid obesity with BMI of 45.0-49.9, adult (HCC) Meets criteria with BMI greater than 40        Consultants: None Procedures performed: None Disposition: Home Diet recommendation:  Discharge Diet Orders (From admission, onward)     Start     Ordered   10/24/22 0000  Diet - low sodium heart healthy        10/24/22 1530           Cardiac and Carb modified diet DISCHARGE MEDICATION: Allergies as of 10/24/2022       Reactions   Sulfa Antibiotics Hives   Lisinopril Cough   cough        Medication List     TAKE these medications    amLODipine 5 MG tablet Commonly known as: NORVASC Take 1 tablet (5 mg total) by mouth daily.   BD Pen Needle Micro U/F 32G X 6 MM Misc Generic drug: Insulin Pen Needle Inject into the skin daily.   Colcrys 0.6 MG tablet Generic drug: colchicine Take 0.6 mg by mouth daily.   Dextromethorphan-guaiFENesin 10-100 MG/5ML liquid Take 10 mLs by mouth every 6 (six) hours as needed.   doxycycline 100 MG capsule Commonly known as: VIBRAMYCIN Take 1 capsule (100 mg total) by mouth 2 (two) times daily.   escitalopram 20 MG tablet Commonly known as: LEXAPRO Take 40 mg by mouth daily.   FreeStyle Libre 2 Reader Hardie Pulley USE TO CHECK GLUCOSE AS DIRECTED   FreeStyle Libre 2 Sensor Misc USE AS DIRECTED TO CHECK BLOOD SUGAR AND CHANGE EVERY 14 DAYS   gabapentin 300 MG capsule Commonly known as: NEURONTIN Take 300 mg by mouth 2 (two) times daily.   guaifenesin 400 MG Tabs tablet Commonly known as: HUMIBID E Take 400 mg by mouth 2 (two) times daily.    hydrALAZINE 50 MG tablet Commonly known as: APRESOLINE Take 2 tablets (100 mg total) by mouth every 8 (eight) hours.   hydrOXYzine 50 MG tablet Commonly known as: ATARAX Take 50 mg by mouth 3 (three) times daily as needed for anxiety.   losartan 25 MG tablet Commonly known as: COZAAR Take 1 tablet (25 mg total) by mouth daily.   metFORMIN 1000 MG tablet Commonly known as: GLUCOPHAGE Take 1,000 mg by mouth 2 (two) times daily with a meal.   metoCLOPramide 10 MG tablet Commonly known as: Reglan Take 1 tablet (10 mg total) by mouth every 8 (eight) hours as needed for nausea.   metoprolol tartrate 50 MG tablet Commonly known as: LOPRESSOR Take 1 tablet (50 mg total) by mouth 2 (two) times daily.   NovoLOG FlexPen 100 UNIT/ML FlexPen Generic drug: insulin aspart CBG 70 - 120: 0 units CBG 121 - 150: 1 unit CBG 151 - 200: 2 units CBG 201 - 250: 3 units CBG 251 - 300: 5 units CBG 301 - 350: 7 units CBG  351 - 400: 9 units CBG > 400: call MD   pantoprazole 40 MG tablet Commonly known as: PROTONIX Take 1 tablet (40 mg total) by mouth 2 (two) times daily before a meal.   Toujeo Max SoloStar 300 UNIT/ML Solostar Pen Generic drug: insulin glargine (2 Unit Dial) Inject 40 Units into the skin 2 (two) times daily. What changed:  how much to take when to take this        Discharge Exam: Filed Weights   10/21/22 1014  Weight: 113.9 kg   General: Alert and oriented x 3, no acute distress Cardiovascular: Regular rate and rhythm, S1-S2 Lungs: Decreased breath sounds throughout secondary to body habitus  Condition at discharge: improving  The results of significant diagnostics from this hospitalization (including imaging, microbiology, ancillary and laboratory) are listed below for reference.   Imaging Studies: ECHOCARDIOGRAM COMPLETE  Result Date: 10/23/2022    ECHOCARDIOGRAM REPORT   Patient Name:   Theresa Malone Date of Exam: 10/23/2022 Medical Rec #:  [id number]       Height:       61.0 in Accession #:    [id number]     Weight:       251.1 lb Date of Birth:  Aug 06, 1981     BSA:          2.081 m Patient Age:    40 years       BP:           158/92 mmHg Patient Gender: F              HR:           80 bpm. Exam Location:  [facility] Procedure: 2D Echo, Color Doppler and Cardiac Doppler Indications:     CHF-acute diastolic I50.31  History:         Patient has no prior history of Echocardiogram examinations.                  Risk Factors:Diabetes and Hypertension.  Sonographer:     Cristela Blue Referring Phys:  [id number] Oniyah Rohe [facility] Diagnosing Phys: Yvonne Kendall MD IMPRESSIONS  1. Left ventricular ejection fraction, by estimation, is 70 to 75%. The left ventricle has hyperdynamic function. The left ventricle has no regional wall motion abnormalities. There is severe left ventricular hypertrophy. Left ventricular diastolic parameters are consistent with Grade II diastolic dysfunction (pseudonormalization). Intracavitary gradient noted, with peak gradient of up to 45 mmHg.  2. Right ventricular systolic function is normal. The right ventricular size is normal.  3. The mitral valve is normal in structure. Mild mitral valve regurgitation. No evidence of mitral stenosis.  4. The aortic valve is tricuspid. Aortic valve regurgitation is not visualized. No aortic stenosis is present.  5. The pulmonic valve was abnormal. FINDINGS  Left Ventricle: Left ventricular ejection fraction, by estimation, is 70 to 75%. The left ventricle has hyperdynamic function. The left ventricle has no regional wall motion abnormalities. The left ventricular internal cavity size was normal in size. There is severe left ventricular hypertrophy. Left ventricular diastolic parameters are consistent with Grade II diastolic dysfunction (pseudonormalization). Intracavitary gradient noted, with peak gradient of up to 45 mmHg.  Right Ventricle: The right ventricular size is normal. No increase in right ventricular wall  thickness. Right ventricular systolic function is normal. Left Atrium: Left atrial size was normal in size. Right Atrium: Right atrial size was normal in size. Pericardium: Trivial pericardial effusion is present. Mitral Valve: The mitral valve is normal  in structure. Mild mitral valve regurgitation. No evidence of mitral valve stenosis. Tricuspid Valve: The tricuspid valve is not well visualized. Tricuspid valve regurgitation is trivial. Aortic Valve: The aortic valve is tricuspid. Aortic valve regurgitation is not visualized. No aortic stenosis is present. Aortic valve mean gradient measures 8.0 mmHg. Aortic valve peak gradient measures 12.2 mmHg. Aortic valve area, by VTI measures 3.07  cm. Pulmonic Valve: The pulmonic valve was abnormal. Pulmonic valve regurgitation is trivial. No evidence of pulmonic stenosis. Aorta: The aortic root is normal in size and structure. Pulmonary Artery: The pulmonary artery is of normal size. IAS/Shunts: The interatrial septum was not well visualized.  LEFT VENTRICLE PLAX 2D LVIDd:         3.40 cm   Diastology LVIDs:         2.30 cm   LV e' medial:    5.00 cm/s LV PW:         1.75 cm   LV E/e' medial:  13.4 LV IVS:        1.58 cm   LV e' lateral:   5.11 cm/s LVOT diam:     2.00 cm   LV E/e' lateral: 13.1 LV SV:         90 LV SV Index:   43 LVOT Area:     3.14 cm  RIGHT VENTRICLE RV Basal diam:  2.10 cm RV Mid diam:    2.40 cm RV S prime:     13.70 cm/s TAPSE (M-mode): 1.8 cm LEFT ATRIUM             Index        RIGHT ATRIUM          Index LA diam:        3.10 cm 1.49 cm/m   RA Area:     6.91 cm LA Vol (A2C):   55.9 ml 26.87 ml/m  RA Volume:   9.36 ml  4.50 ml/m LA Vol (A4C):   40.1 ml 19.27 ml/m LA Biplane Vol: 49.4 ml 23.74 ml/m  AORTIC VALVE AV Area (Vmax):    2.98 cm AV Area (Vmean):   2.96 cm AV Area (VTI):     3.07 cm AV Vmax:           175.00 cm/s AV Vmean:          135.000 cm/s AV VTI:            0.294 m AV Peak Grad:      12.2 mmHg AV Mean Grad:      8.0 mmHg  LVOT Vmax:         166.00 cm/s LVOT Vmean:        127.000 cm/s LVOT VTI:          0.287 m LVOT/AV VTI ratio: 0.98  AORTA Ao Root diam: 3.45 cm MITRAL VALVE               TRICUSPID VALVE MV Area (PHT): 3.03 cm    TR Peak grad:   19.0 mmHg MV Decel Time: 250 msec    TR Vmax:        218.00 cm/s MV E velocity: 67.10 cm/s MV A velocity: 63.40 cm/s  SHUNTS MV E/A ratio:  1.06        Systemic VTI:  0.29 m                            Systemic Diam: 2.00 cm  Yvonne Kendallhristopher End MD Electronically signed by Yvonne Kendallhristopher End MD Signature Date/Time: 10/23/2022/7:30:26 PM    Final    DG Abd Portable 1V  Result Date: 10/23/2022 CLINICAL DATA:  Ileus EXAM: PORTABLE ABDOMEN - 1 VIEW COMPARISON:  09/12/2022 FINDINGS: The bowel gas pattern is normal. No radio-opaque calculi or other significant radiographic abnormality are seen. IMPRESSION: Negative. Electronically Signed   By: Layla MawJoshua  Pleasure M.D.   On: 10/23/2022 10:22    Microbiology: Results for orders placed or performed during the hospital encounter of 10/21/22  MRSA Next Gen by PCR, Nasal     Status: None   Collection Time: 10/22/22 11:40 AM   Specimen: Nasal Mucosa; Nasal Swab  Result Value Ref Range Status   MRSA by PCR Next Gen NOT DETECTED NOT DETECTED Final    Comment: (NOTE) The GeneXpert MRSA Assay (FDA approved for NASAL specimens only), is one component of a comprehensive MRSA colonization surveillance program. It is not intended to diagnose MRSA infection nor to guide or monitor treatment for MRSA infections. Test performance is not FDA approved in patients less than 39 years old. Performed at [facility], [address] [place], [place] [address]     Labs: CBC: Recent Labs  Lab 10/20/22 1633 10/21/22 1024 10/22/22 0233 10/23/22 0532 10/24/22 0540  WBC 10.5 14.5* 13.4* 10.6* 10.4  NEUTROABS  --   --  11.0*  --   --   HGB 10.3* 11.4* 10.1* 10.2* 12.5  HCT 32.0* 35.5* 31.1* 32.6* 40.1  MCV 79.0* 80.1 79.7* 82.1 82.0  PLT  433* 523* 427* 429* 428*   Basic Metabolic Panel: Recent Labs  Lab 10/21/22 1024 10/21/22 1455 10/21/22 2220 10/22/22 0233 10/22/22 0713 10/23/22 0532 10/24/22 0540  NA 132*   < > 140 139 137 138 140  K 4.3   < > 3.8 3.4* 3.4* 3.5 3.2*  CL 90*   < > 105 104 102 102 103  CO2 24   < > 25 25 25 27 26   GLUCOSE 667*   < > 221* 171* 245* 213* 115*  BUN 28*   < > 23* 20 19 22* 26*  CREATININE 1.50*   < > 1.18* 1.11* 1.09* 1.21* 1.44*  CALCIUM 10.2   < > 9.5 9.8 9.5 9.5 9.8  MG 2.0  --   --   --   --   --   --   PHOS 5.3*  --   --   --   --   --   --    < > = values in this interval not displayed.   Liver Function Tests: Recent Labs  Lab 10/20/22 1633  AST 14*  ALT 20  ALKPHOS 144*  BILITOT 0.7  PROT 8.7*  ALBUMIN 3.4*   CBG: Recent Labs  Lab 10/23/22 1107 10/23/22 1753 10/23/22 1952 10/24/22 0822 10/24/22 1156  GLUCAP 167* 186* 184* 181* 153*    Discharge time spent: greater than 30 minutes.  Signed: Hollice EspySendil K Cristela Stalder, MD [facility] 10/24/2022

## 2022-10-24 NOTE — Progress Notes (Signed)
Per Dr Rito Ehrlich, dc tele monitoring

## 2022-12-06 ENCOUNTER — Telehealth: Payer: Medicaid Other | Admitting: Family

## 2022-12-06 ENCOUNTER — Ambulatory Visit: Payer: Self-pay | Admitting: *Deleted

## 2022-12-06 DIAGNOSIS — G8929 Other chronic pain: Secondary | ICD-10-CM

## 2022-12-06 DIAGNOSIS — M79672 Pain in left foot: Secondary | ICD-10-CM

## 2022-12-06 DIAGNOSIS — M25522 Pain in left elbow: Secondary | ICD-10-CM

## 2022-12-06 DIAGNOSIS — M25521 Pain in right elbow: Secondary | ICD-10-CM | POA: Diagnosis not present

## 2022-12-06 DIAGNOSIS — M79671 Pain in right foot: Secondary | ICD-10-CM

## 2022-12-06 DIAGNOSIS — M25512 Pain in left shoulder: Secondary | ICD-10-CM

## 2022-12-06 DIAGNOSIS — M25511 Pain in right shoulder: Secondary | ICD-10-CM

## 2022-12-06 MED ORDER — DICLOFENAC SODIUM 75 MG PO TBEC: 75.0000 mg | DELAYED_RELEASE_TABLET | Freq: Two times a day (BID) | ORAL | 0 refills | Status: DC

## 2022-12-06 NOTE — Telephone Encounter (Signed)
Reason for Disposition  Foot pain is a chronic symptom (recurrent or ongoing AND present > 4 weeks)  Answer Assessment - Initial Assessment Questions 1. ONSET: "When did the pain start?"      Pain in feet ankles and elbows.    I'm having a problem working as a result. 2. LOCATION: "Where is the pain located?"      I need a new PCP.    I'm living in the [place] area now. 3. PAIN: "How bad is the pain?"    (Scale 1-10; or mild, moderate, severe)  - MILD (1-3): doesn't interfere with normal activities.   - MODERATE (4-7): interferes with normal activities (e.g., work or school) or awakens from sleep, limping.   - SEVERE (8-10): excruciating pain, unable to do any normal activities, unable to walk.      Moderate I'm taking prednisone. 4. WORK OR EXERCISE: "Has there been any recent work or exercise that involved this part of the body?"      No 5. CAUSE: "What do you think is causing the foot pain?"     Gout  6. OTHER SYMPTOMS: "Do you have any other symptoms?" (e.g., leg pain, rash, fever, numbness)     N/A 7. PREGNANCY: "Is there any chance you are pregnant?" "When was your last menstrual period?"     N/A  Protocols used: Foot Pain-A-AH

## 2022-12-06 NOTE — Progress Notes (Signed)
Virtual Visit Consent   Theresa Malone, you are scheduled for a virtual visit with a [facility] provider today. Just as with appointments in the office, your consent must be obtained to participate. Your consent will be active for this visit and any virtual visit you may have with one of our providers in the next 365 days. If you have a MyChart account, a copy of this consent can be sent to you electronically.  As this is a virtual visit, video technology does not allow for your provider to perform a traditional examination. This may limit your provider's ability to fully assess your condition. If your provider identifies any concerns that need to be evaluated in person or the need to arrange testing (such as labs, EKG, etc.), we will make arrangements to do so. Although advances in technology are sophisticated, we cannot ensure that it will always work on either your end or our end. If the connection with a video visit is poor, the visit may have to be switched to a telephone visit. With either a video or telephone visit, we are not always able to ensure that we have a secure connection.  By engaging in this virtual visit, you consent to the provision of healthcare and authorize for your insurance to be billed (if applicable) for the services provided during this visit. Depending on your insurance coverage, you may receive a charge related to this service.  I need to obtain your verbal consent now. Are you willing to proceed with your visit today? Theresa Malone has provided verbal consent on 12/06/2022 for a virtual visit (video or telephone). Theresa Dun, FNP  Date: 12/06/2022 4:45 PM  Virtual Visit via Video Note   I, Theresa Malone, connected with  Theresa Malone  ([id number], 10/24/1981) on 12/06/22 at  5:00 PM EST by a video-enabled telemedicine application and verified that I am speaking with the correct person using two identifiers.  Location: Patient: Virtual Visit Location Patient:  Other: home Provider: Virtual Visit Location Provider: Home Office   I discussed the limitations of evaluation and management by telemedicine and the availability of in person appointments. The patient expressed understanding and agreed to proceed.    History of Present Illness: Theresa Malone is a 42 y.o. who identifies as a female who was assigned female at birth, and is being seen today for shoulders, arms,and elbows. She has been taking tylenol without relief. She has some prednisone she has been taking as needed  that helps. She is seeing a new PCP in the next few weeks.   HPI: Arthritis Presents for follow-up visit. She complains of pain and stiffness. Affected locations include the right shoulder, left shoulder, left elbow, right elbow, right foot and left foot. Her pain is at a severity of 8/10.    Problems:  Patient Active Problem List   Diagnosis Date Noted   Uncontrolled diabetes mellitus with hyperglycemia, with long-term current use of insulin ([place]) 10/23/2022   Gastroparesis 10/23/2022   Anxiety 10/23/2022   Hypertensive urgency 10/21/2022   Hematemesis 10/21/2022   Intractable nausea and vomiting 09/12/2022   Nausea vomiting and diarrhea 09/10/2022   Type II diabetes mellitus with renal manifestations ([place]) 09/10/2022   Prolonged QT interval 09/10/2022   Depression with anxiety 09/10/2022   Leukocytosis 09/10/2022   Elevated lipase 09/10/2022   Cough 09/10/2022   Gastroenteritis 09/07/2022   Depression    Uncontrolled type 2 diabetes mellitus with hyperglycemia, with long-term current use of insulin ([place])  Cyst of left ovary 07/21/2021   Microcytic anemia 04/17/2021   Encounter for gynecological examination with Papanicolaou smear of cervix 03/22/2021   Dysmenorrhea 12/15/2020   Encounter for IUD removal 12/15/2020   Sepsis without acute organ dysfunction (HCC)    Acute pyelonephritis 09/22/2020   Pyelonephritis 09/22/2020   Emesis, persistent 05/21/2019    Cellulitis    Intractable vomiting with nausea 04/13/2019   Acute respiratory alkalosis    Intractable vomiting    Chest pain    Cellulitis of right lower extremity 04/12/2019   Intractable abdominal pain 04/11/2019   Metabolic alkalosis 04/11/2019   Respiratory alkalosis 04/11/2019   Volume depletion 04/11/2019   Hypokalemia 04/11/2019   Hyponatremia 04/11/2019   Yeast vaginitis 03/13/2018   Essential hypertension 02/07/2016   Menorrhagia with regular cycle 02/07/2016   Frequent loose stools 02/07/2016   Status post cesarean section 01/25/2015   Hidradenitis axillaris 09/15/2014   Susceptible to varicella (non-immune), currently pregnant 07/24/2014   Hidradenitis suppurativa 07/20/2014   Morbid obesity with BMI of 45.0-49.9, adult (HCC) 03/03/2014    Allergies:  Allergies  Allergen Reactions   Sulfa Antibiotics Hives   Lisinopril Cough    cough   Medications:  Current Outpatient Medications:    diclofenac (VOLTAREN) 75 MG EC tablet, Take 1 tablet (75 mg total) by mouth 2 (two) times daily., Disp: 60 tablet, Rfl: 0   amLODipine (NORVASC) 5 MG tablet, Take 1 tablet (5 mg total) by mouth daily., Disp: 30 tablet, Rfl: 0   BD PEN NEEDLE MICRO U/F 32G X 6 MM MISC, Inject into the skin daily., Disp: , Rfl:    COLCRYS 0.6 MG tablet, Take 0.6 mg by mouth daily., Disp: , Rfl:    Continuous Blood Gluc Receiver (FREESTYLE LIBRE 2 READER) DEVI, USE TO CHECK GLUCOSE AS DIRECTED, Disp: , Rfl:    Continuous Blood Gluc Sensor (FREESTYLE LIBRE 2 SENSOR) MISC, USE AS DIRECTED TO CHECK BLOOD SUGAR AND CHANGE EVERY 14 DAYS, Disp: , Rfl:    Dextromethorphan-guaiFENesin 10-100 MG/5ML liquid, Take 10 mLs by mouth every 6 (six) hours as needed., Disp: , Rfl:    doxycycline (VIBRAMYCIN) 100 MG capsule, Take 1 capsule (100 mg total) by mouth 2 (two) times daily., Disp: 180 capsule, Rfl: 0   escitalopram (LEXAPRO) 20 MG tablet, Take 40 mg by mouth daily., Disp: , Rfl:    gabapentin (NEURONTIN) 300 MG  capsule, Take 300 mg by mouth 2 (two) times daily., Disp: , Rfl:    guaifenesin (HUMIBID E) 400 MG TABS tablet, Take 400 mg by mouth 2 (two) times daily., Disp: , Rfl:    hydrALAZINE (APRESOLINE) 50 MG tablet, Take 2 tablets (100 mg total) by mouth every 8 (eight) hours., Disp: 90 tablet, Rfl: 4   hydrOXYzine (ATARAX) 50 MG tablet, Take 50 mg by mouth 3 (three) times daily as needed for anxiety., Disp: , Rfl:    insulin aspart (NOVOLOG FLEXPEN) 100 UNIT/ML FlexPen, CBG 70 - 120: 0 units CBG 121 - 150: 1 unit CBG 151 - 200: 2 units CBG 201 - 250: 3 units CBG 251 - 300: 5 units CBG 301 - 350: 7 units CBG 351 - 400: 9 units CBG > 400: call MD, Disp: 15 mL, Rfl: 11   insulin glargine, 2 Unit Dial, (TOUJEO MAX SOLOSTAR) 300 UNIT/ML Solostar Pen, Inject 40 Units into the skin 2 (two) times daily., Disp: 3 mL, Rfl: 3   losartan (COZAAR) 25 MG tablet, Take 1 tablet (25 mg total) by mouth  daily., Disp: , Rfl:    metFORMIN (GLUCOPHAGE) 1000 MG tablet, Take 1,000 mg by mouth 2 (two) times daily with a meal., Disp: , Rfl:    metoCLOPramide (REGLAN) 10 MG tablet, Take 1 tablet (10 mg total) by mouth every 8 (eight) hours as needed for nausea., Disp: 30 tablet, Rfl: 0   metoprolol tartrate (LOPRESSOR) 50 MG tablet, Take 1 tablet (50 mg total) by mouth 2 (two) times daily., Disp: 60 tablet, Rfl: 1   pantoprazole (PROTONIX) 40 MG tablet, Take 1 tablet (40 mg total) by mouth 2 (two) times daily before a meal., Disp: 60 tablet, Rfl: 0  Observations/Objective: Patient is well-developed, well-nourished in no acute distress.  Resting comfortably  at home.  Head is normocephalic, atraumatic.  No labored breathing.  Speech is clear and coherent with logical content.  Patient is alert and oriented at baseline.    Assessment and Plan: 1. Chronic pain of both shoulders - diclofenac (VOLTAREN) 75 MG EC tablet; Take 1 tablet (75 mg total) by mouth 2 (two) times daily.  Dispense: 60 tablet; Refill: 0  2. Pain of both  elbows - diclofenac (VOLTAREN) 75 MG EC tablet; Take 1 tablet (75 mg total) by mouth 2 (two) times daily.  Dispense: 60 tablet; Refill: 0  3. Pain in both feet - diclofenac (VOLTAREN) 75 MG EC tablet; Take 1 tablet (75 mg total) by mouth 2 (two) times daily.  Dispense: 60 tablet; Refill: 0  Start diclofenac BID with food No other NSAID's Continue tylenol as needed Keep follow up  with PCP to establish care  Follow Up Instructions: I discussed the assessment and treatment plan with the patient. The patient was provided an opportunity to ask questions and all were answered. The patient agreed with the plan and demonstrated an understanding of the instructions.  A copy of instructions were sent to the patient via MyChart unless otherwise noted below.    The patient was advised to call back or seek an in-person evaluation if the symptoms worsen or if the condition fails to improve as anticipated.  Time:  I spent 12 minutes with the patient via telehealth technology discussing the above problems/concerns.    Theresa Dun, FNP

## 2022-12-06 NOTE — Telephone Encounter (Addendum)
  Chief Complaint: legs and feet hurting elbows too from gout.   Need to establish with a new PCP. Symptoms: pain in joints legs, feet and elbows Frequency: ongoing issue with gout Pertinent Negatives: Patient denies N/A Disposition: [] ED /[] Urgent Care (no appt availability in office) / [] Appointment(In office/virtual)/ [x]  [facility]/ [] Home Care/ [] Refused Recommended Disposition /[] [facility]/ []  Follow-up with PCP Additional Notes: Got her an appt with [employer] to establish as a new pt. With [employer], PA-C .      Also got her scheduled for a [facility] Virtual Visit for today at 5:00.

## 2022-12-06 NOTE — Telephone Encounter (Signed)
New pt. Appt is for 03/13/2023 at 9:00 AM with Mardene Speak, PA-C at [facility].

## 2023-02-05 ENCOUNTER — Encounter (HOSPITAL_COMMUNITY): Payer: Self-pay | Admitting: Hematology

## 2023-02-05 ENCOUNTER — Ambulatory Visit: Payer: Self-pay

## 2023-02-05 NOTE — Telephone Encounter (Signed)
  Chief Complaint: center of chest heaviness and pressure radiates to back right arm and down to legs Symptoms: pt stated severe pain comes and goes every 8 hours, SOB at night and cough Frequency: since November Pertinent Negatives: Patient denies sweating Disposition: [x] ED /[] Urgent Care (no appt availability in office) / [] Appointment(In office/virtual)/ []  [facility]/ [] Home Care/ [] Refused Recommended Disposition /[] [facility]/ []  Follow-up with PCP Additional Notes: unsure when pt will go= pt at work - advised pt to go within the hour. Reason for Disposition  [1] Chest pain lasts > 5 minutes AND [2] described as crushing, pressure-like, or heavy  Answer Assessment - Initial Assessment Questions 1. LOCATION: "Where does it hurt?"       Center of chest  2. RADIATION: "Does the pain go anywhere else?" (e.g., into neck, jaw, arms, back)    Right Elbow right and back to legs and feet  delays gabapentin 2 arthritis strength  800 Ibuprofen 3. ONSET: "When did the chest pain begin?" (Minutes, hours or days)      After last hospital syncopal episode pushed down in thje breastbone and have been hurting -10/26/23 4. PATTERN: "Does the pain come and go, or has it been constant since it started?"  "Does it get worse with exertion?"      Comes and goes heavy and pressure severe pain  5. DURATION: "How long does it last" (e.g., seconds, minutes, hours)     Every 8 hours 6. SEVERITY: "How bad is the pain?"  (e.g., Scale 1-10; mild, moderate, or severe)    - MILD (1-3): doesn't interfere with normal activities     - MODERATE (4-7): interferes with normal activities or awakens from sleep    - SEVERE (8-10): excruciating pain, unable to do any normal activities       Moderate  7. CARDIAC RISK FACTORS: "Do you have any history of heart problems or risk factors for heart disease?" (e.g., angina, prior heart attack; diabetes, high blood pressure, high cholesterol, smoker, or  strong family history of heart disease)     no 8. PULMONARY RISK FACTORS: "Do you have any history of lung disease?"  (e.g., blood clots in lung, asthma, emphysema, birth control pills)     *No Answer* 9. CAUSE: "What do you think is causing the chest pain?"     Finger on breast bone  10. OTHER SYMPTOMS: "Do you have any other symptoms?" (e.g., dizziness, nausea, vomiting, sweating, fever, difficulty breathing, cough)       Cough - small ammountsince hospital like pressure SOB at night  11. PREGNANCY: "Is there any chance you are pregnant?" "When was your last menstrual period?"       N/a  Protocols used: Chest Pain-A-AH

## 2023-02-14 NOTE — Progress Notes (Signed)
I,Theresa Malone,acting as a Education administrator for [employer], PA-C.,have documented all relevant documentation on the behalf of Theresa Kirschner, PA-C,as directed by  Theresa Kirschner, PA-C while in the presence of Theresa Kirschner, PA-C.   New patient visit   Patient: Theresa Malone   DOB: 01-17-1981   42 y.o. Female  MRN: [id number] Visit Date: 02/15/2023  Today's healthcare provider: Mikey Kirschner, PA-C   Cc. New patient, severe pain  Subjective    Theresa Malone is a 42 y.o. female who presents today as a new patient to establish care.  HPI  Pt reports today with concerns over whole body pain. Attests to chest, back, shoulder, knee, leg, arm, hand and elbow pain. Denies injury. Reports pain for a year but worsening. Reports it is unbearable and dramatically affects her quality of life.  -Currently manages with tylenol 1000 mg q 8 hours. Alleve and motrin 2-3 times a day, and gabapentin. She currently takes 600 mg TID.   Pt has a history of HTN, DM, HLD, gout.   HTN -Not currently medicated. Many meds on chart. She brings hydralazine and metoprolol with her.  DM -Pt takes metformin 1000 mg TID.  -She does 30 U of novalog at night and 25 U in the AM after breakfast.  -Checks her sugar sporadically, I see her machine today-- averages 200s fasting and 300-400s in evening/late afternoon  She also takes lexapro 40 mg daily for anxiety. Reports feeling well and stable on this.  Past Medical History:  Diagnosis Date   Anxiety    panic attacks   Cellulitis    Depression    Diabetes mellitus    Gout    Hidradenitis suppurativa    Hypertension    Obesity    Venous stasis    Venous stasis dermatitis    Past Surgical History:  Procedure Laterality Date   CESAREAN SECTION     C/S x 2   CESAREAN SECTION N/A 01/24/2015   Procedure: CESAREAN SECTION;  Surgeon: Mora Bellman, MD;  Location: [facility];  Service: Obstetrics;  Laterality: N/A;   TUBAL LIGATION     Family Status  Relation  Name Status   PGF  Deceased   PGM  Deceased   Father  Alive   Mother  Alive   Mat Uncle  Deceased   [place]   MGF  Deceased   Daughter  Alive   Son  Alive   Daughter  Alive   Daughter  Alive   Family History  Problem Relation Age of Onset   Diabetes Paternal Grandfather    Cancer Paternal Grandfather        liver   Cancer Paternal Grandmother        liver   Hypertension Father    Diabetes Mother    Hypertension Mother    Diabetes Maternal Uncle    Diabetes Maternal Grandmother    Cancer Maternal Grandfather    Diabetes Daughter        boarderline    Asthma Daughter    Bronchitis Daughter    Bronchitis Son    Bronchitis Daughter    Social History   Socioeconomic History   Marital status: Married    Spouse name: Not on file   Number of children: Not on file   Years of education: Not on file   Highest education level: Not on file  Occupational History   Not on file  Tobacco Use   Smoking status: Never   Smokeless  tobacco: Never  Vaping Use   Vaping Use: Never used  Substance and Sexual Activity   Alcohol use: Not Currently    Comment: wine occ   Drug use: No   Sexual activity: Yes    Birth control/protection: Surgical    Comment: tubal  Other Topics Concern   Not on file  Social History Narrative   Not on file   Social Determinants of Health   Financial Resource Strain: Low Risk  (03/22/2021)   Overall Financial Resource Strain (CARDIA)    Difficulty of Paying Living Expenses: Not hard at all  Food Insecurity: No Food Insecurity (10/22/2022)   Hunger Vital Sign    Worried About Running Out of Food in the Last Year: Never true    Ran Out of Food in the Last Year: Never true  Transportation Needs: No Transportation Needs (10/22/2022)   PRAPARE - Hydrologist (Medical): No    Lack of Transportation (Non-Medical): No  Physical Activity: Insufficiently Active (04/17/2021)   Exercise Vital Sign    Days of Exercise per  Week: 2 days    Minutes of Exercise per Session: 20 min  Stress: No Stress Concern Present (04/17/2021)   [place]    Feeling of Stress : Not at all  Recent Concern: Stress - Stress Concern Present (03/22/2021)   [place]    Feeling of Stress : To some extent  Social Connections: Socially Integrated (04/17/2021)   Social Connection and Isolation Panel [NHANES]    Frequency of Communication with Friends and Family: More than three times a week    Frequency of Social Gatherings with Friends and Family: More than three times a week    Attends Religious Services: 1 to 4 times per year    Active Member of [employer] or Organizations: No    Attends Music therapist: 1 to 4 times per year    Marital Status: Married   Outpatient Medications Prior to Visit  Medication Sig   acetaminophen (TYLENOL) 650 MG CR tablet Take 650 mg by mouth every 8 (eight) hours as needed for pain.   BD PEN NEEDLE MICRO U/F 32G X 6 MM MISC Inject into the skin daily.   Continuous Blood Gluc Receiver (FREESTYLE LIBRE 2 READER) DEVI USE TO CHECK GLUCOSE AS DIRECTED   Continuous Blood Gluc Sensor (FREESTYLE LIBRE 2 SENSOR) MISC USE AS DIRECTED TO CHECK BLOOD SUGAR AND CHANGE EVERY 14 DAYS   escitalopram (LEXAPRO) 20 MG tablet Take 40 mg by mouth daily.   gabapentin (NEURONTIN) 300 MG capsule Take 300 mg by mouth 2 (two) times daily.   insulin aspart (NOVOLOG FLEXPEN) 100 UNIT/ML FlexPen CBG 70 - 120: 0 units CBG 121 - 150: 1 unit CBG 151 - 200: 2 units CBG 201 - 250: 3 units CBG 251 - 300: 5 units CBG 301 - 350: 7 units CBG 351 - 400: 9 units CBG > 400: call MD   metFORMIN (GLUCOPHAGE) 1000 MG tablet Take 1,000 mg by mouth 2 (two) times daily with a meal.   metoprolol tartrate (LOPRESSOR) 50 MG tablet Take 1 tablet (50 mg total) by mouth 2 (two) times daily.   naproxen sodium (ALEVE)  220 MG tablet Take 220 mg by mouth.   [DISCONTINUED] hydrALAZINE (APRESOLINE) 50 MG tablet Take 2 tablets (100 mg total) by mouth every 8 (eight) hours.   [DISCONTINUED] amLODipine (NORVASC) 5  MG tablet Take 1 tablet (5 mg total) by mouth daily. (Patient not taking: Reported on 02/15/2023)   [DISCONTINUED] COLCRYS 0.6 MG tablet Take 0.6 mg by mouth daily. (Patient not taking: Reported on 02/15/2023)   [DISCONTINUED] Dextromethorphan-guaiFENesin 10-100 MG/5ML liquid Take 10 mLs by mouth every 6 (six) hours as needed. (Patient not taking: Reported on 02/15/2023)   [DISCONTINUED] diclofenac (VOLTAREN) 75 MG EC tablet Take 1 tablet (75 mg total) by mouth 2 (two) times daily. (Patient not taking: Reported on 02/15/2023)   [DISCONTINUED] guaifenesin (HUMIBID E) 400 MG TABS tablet Take 400 mg by mouth 2 (two) times daily. (Patient not taking: Reported on 02/15/2023)   [DISCONTINUED] hydrOXYzine (ATARAX) 50 MG tablet Take 50 mg by mouth 3 (three) times daily as needed for anxiety. (Patient not taking: Reported on 02/15/2023)   [DISCONTINUED] insulin glargine, 2 Unit Dial, (TOUJEO MAX SOLOSTAR) 300 UNIT/ML Solostar Pen Inject 40 Units into the skin 2 (two) times daily. (Patient not taking: Reported on 02/15/2023)   [DISCONTINUED] losartan (COZAAR) 25 MG tablet Take 1 tablet (25 mg total) by mouth daily. (Patient not taking: Reported on 02/15/2023)   [DISCONTINUED] metoCLOPramide (REGLAN) 10 MG tablet Take 1 tablet (10 mg total) by mouth every 8 (eight) hours as needed for nausea. (Patient not taking: Reported on 02/15/2023)   [DISCONTINUED] pantoprazole (PROTONIX) 40 MG tablet Take 1 tablet (40 mg total) by mouth 2 (two) times daily before a meal. (Patient not taking: Reported on 02/15/2023)   No facility-administered medications prior to visit.   Allergies  Allergen Reactions   Sulfa Antibiotics Hives   Lisinopril Cough    cough    Immunization History  Administered Date(s) Administered   Moderna Sars-Covid-2  Vaccination 05/19/2020, 06/16/2020   Pneumococcal Polysaccharide-23 04/13/2019   Tdap 01/26/2015    Health Maintenance  Topic Date Due   FOOT EXAM  Never done   OPHTHALMOLOGY EXAM  Never done   Hepatitis C Screening  Never done   Diabetic kidney evaluation - Urine ACR  02/05/2016   COVID-19 Vaccine (3 - Moderna risk series) 07/14/2020   INFLUENZA VACCINE  Never done   HEMOGLOBIN A1C  03/09/2023   Diabetic kidney evaluation - eGFR measurement  10/25/2023   PAP SMEAR-Modifier  03/22/2024   DTaP/Tdap/Td (2 - Td or Tdap) 01/25/2025   HIV Screening  Completed   HPV VACCINES  Aged Out    Patient Care Team: Theresa Kirschner, PA-C as PCP - General (Physician Assistant) Daneil Dolin, MD as Consulting Physician (Gastroenterology)  Review of Systems  Respiratory:  Positive for cough.   Cardiovascular:  Positive for chest pain and leg swelling.  Musculoskeletal:  Positive for arthralgias, back pain, gait problem, joint swelling, myalgias, neck pain and neck stiffness.     Objective    BP (!) 155/92 (BP Location: Left Arm, Patient Position: Sitting, Cuff Size: Large)   Pulse 100   Ht 5\' 1"  (1.549 m)   Wt 234 lb (106.1 kg)   LMP 08/26/2022 (Within Months)   SpO2 100%   BMI 44.21 kg/m    Physical Exam Constitutional:      General: She is awake.     Appearance: She is well-developed.  HENT:     Head: Normocephalic.  Eyes:     Conjunctiva/sclera: Conjunctivae normal.  Cardiovascular:     Rate and Rhythm: Normal rate and regular rhythm.     Heart sounds: Normal heart sounds.  Pulmonary:     Effort: Pulmonary effort is normal.  Breath sounds: Normal breath sounds.  Skin:    General: Skin is warm.  Neurological:     Mental Status: She is alert and oriented to person, place, and time.  Psychiatric:        Attention and Perception: Attention normal.        Mood and Affect: Mood normal.        Speech: Speech normal.        Behavior: Behavior is cooperative.     Depression Screen    02/15/2023   10:47 AM 07/21/2021   11:34 AM 04/17/2021    8:17 AM 03/22/2021   10:40 AM  PHQ 2/9 Scores  PHQ - 2 Score 1 2 0 0  PHQ- 9 Score 4 7  4    No results found for any visits on 02/15/23.  Assessment & Plan      Problem List Items Addressed This Visit       Cardiovascular and Mediastinum   Hypertension associated with diabetes ([place])    Bp elevated today but pt is unmedicated currently. Advised restarting only metoprolol 50 mg and f/b in office. Many other bp meds on her chart but the only other one she brings is hydralazine. Advised not to take this until f/u after restarting metoprolol 50 mg. Ordered cmp      Relevant Orders   CBC w/Diff/Platelet   Comprehensive Metabolic Panel (CMET)   Lipid Profile     Endocrine   Type II diabetes mellitus with renal manifestations ([place]) - Primary    Pt manages with novalog 30 U bedtime and 25 U after breakfast. Advised strongly to only take metformin 1000 mg BID instead of her TID. Once cmp returns, may decrease dose further based on GFR Uacr ordered Will repeat A1c w/ bw last was >12%.  Referred to endocrinology today.       Relevant Orders   CBC w/Diff/Platelet   Comprehensive Metabolic Panel (CMET)   Lipid Profile   HgB A1c   Ambulatory referral to Endocrinology   Urine Microalbumin w/creat. ratio     Other   Anxiety    Managed on lexapro 40 mg This is a supra therapeutic dose but pt feels stable Will not change today.       Pain    Pt reports pain nearly all over her body. Advised tylenol 1000 mg q 8 hours is okay, but to limit her NSAID use d/t her CKD Discussed needing labs before the possibility of prescribing pain medication That her chronic uncontrolled conditions may be contributing to higher levels of inflammation and pain She lives a largely sedentary lifestyle Advised I can refer to rheumatology.  Pt asked for pain medication directly.      Relevant Orders   Ambulatory  referral to Rheumatology   Chronic gout without tophus    Will check uric acid      Relevant Orders   Uric acid   Amenorrhea    Pt attests to no cycle in a long time. S/p tubal ligation. Will check tsh/t4      Relevant Orders   TSH + free T4     Return in about 4 weeks (around 03/15/2023) for hypertension; discuss lab results.     I, Theresa Kirschner, PA-C have reviewed all documentation for this visit. The documentation on  02/15/2023 for the exam, diagnosis, procedures, and orders are all accurate and complete.  Theresa Kirschner, PA-C [facility] [address] [place], [place], [address] Office: [phone] Fax: [phone]  [facility]  

## 2023-02-15 ENCOUNTER — Encounter: Payer: Self-pay | Admitting: Physician Assistant

## 2023-02-15 ENCOUNTER — Ambulatory Visit (INDEPENDENT_AMBULATORY_CARE_PROVIDER_SITE_OTHER): Payer: Medicaid Other | Admitting: Physician Assistant

## 2023-02-15 VITALS — BP 155/92 | HR 100 | Ht 61.0 in | Wt 234.0 lb

## 2023-02-15 DIAGNOSIS — N912 Amenorrhea, unspecified: Secondary | ICD-10-CM | POA: Insufficient documentation

## 2023-02-15 DIAGNOSIS — M1A9XX0 Chronic gout, unspecified, without tophus (tophi): Secondary | ICD-10-CM | POA: Insufficient documentation

## 2023-02-15 DIAGNOSIS — N1831 Chronic kidney disease, stage 3a: Secondary | ICD-10-CM

## 2023-02-15 DIAGNOSIS — E1159 Type 2 diabetes mellitus with other circulatory complications: Secondary | ICD-10-CM | POA: Diagnosis not present

## 2023-02-15 DIAGNOSIS — F419 Anxiety disorder, unspecified: Secondary | ICD-10-CM

## 2023-02-15 DIAGNOSIS — R52 Pain, unspecified: Secondary | ICD-10-CM | POA: Insufficient documentation

## 2023-02-15 DIAGNOSIS — E1122 Type 2 diabetes mellitus with diabetic chronic kidney disease: Secondary | ICD-10-CM | POA: Diagnosis not present

## 2023-02-15 DIAGNOSIS — Z794 Long term (current) use of insulin: Secondary | ICD-10-CM

## 2023-02-15 DIAGNOSIS — I152 Hypertension secondary to endocrine disorders: Secondary | ICD-10-CM

## 2023-02-15 NOTE — Assessment & Plan Note (Signed)
Pt attests to no cycle in a long time. S/p tubal ligation. Will check tsh/t4

## 2023-02-15 NOTE — Assessment & Plan Note (Signed)
Pt manages with novalog 30 U bedtime and 25 U after breakfast. Advised strongly to only take metformin 1000 mg BID instead of her TID. Once cmp returns, may decrease dose further based on GFR Uacr ordered Will repeat A1c w/ bw last was >12%.  Referred to endocrinology today.

## 2023-02-15 NOTE — Assessment & Plan Note (Signed)
Managed on lexapro 40 mg This is a supra therapeutic dose but pt feels stable Will not change today.

## 2023-02-15 NOTE — Assessment & Plan Note (Signed)
Pt reports pain nearly all over her body. Advised tylenol 1000 mg q 8 hours is okay, but to limit her NSAID use d/t her CKD Discussed needing labs before the possibility of prescribing pain medication That her chronic uncontrolled conditions may be contributing to higher levels of inflammation and pain She lives a largely sedentary lifestyle Advised I can refer to rheumatology.  Pt asked for pain medication directly.

## 2023-02-15 NOTE — Assessment & Plan Note (Signed)
Bp elevated today but pt is unmedicated currently. Advised restarting only metoprolol 50 mg and f/b in office. Many other bp meds on her chart but the only other one she brings is hydralazine. Advised not to take this until f/u after restarting metoprolol 50 mg. Ordered cmp

## 2023-02-15 NOTE — Assessment & Plan Note (Signed)
Will check uric acid

## 2023-02-17 LAB — HEMOGLOBIN A1C
Est. average glucose Bld gHb Est-mCnc: 209 mg/dL
Hgb A1c MFr Bld: 8.9 % — ABNORMAL HIGH (ref 4.8–5.6)

## 2023-02-17 LAB — CBC WITH DIFFERENTIAL/PLATELET
Basophils Absolute: 0 10*3/uL (ref 0.0–0.2)
Basos: 0 %
EOS (ABSOLUTE): 0.3 10*3/uL (ref 0.0–0.4)
Eos: 3 %
Hematocrit: 27.9 % — ABNORMAL LOW (ref 34.0–46.6)
Hemoglobin: 8.9 g/dL — ABNORMAL LOW (ref 11.1–15.9)
Immature Grans (Abs): 0 10*3/uL (ref 0.0–0.1)
Immature Granulocytes: 0 %
Lymphocytes Absolute: 2 10*3/uL (ref 0.7–3.1)
Lymphs: 20 %
MCH: 24.7 pg — ABNORMAL LOW (ref 26.6–33.0)
MCHC: 31.9 g/dL (ref 31.5–35.7)
MCV: 78 fL — ABNORMAL LOW (ref 79–97)
Monocytes Absolute: 0.8 10*3/uL (ref 0.1–0.9)
Monocytes: 8 %
Neutrophils Absolute: 6.8 10*3/uL (ref 1.4–7.0)
Neutrophils: 69 %
Platelets: 496 10*3/uL — ABNORMAL HIGH (ref 150–450)
RBC: 3.6 x10E6/uL — ABNORMAL LOW (ref 3.77–5.28)
RDW: 15.8 % — ABNORMAL HIGH (ref 11.7–15.4)
WBC: 9.9 10*3/uL (ref 3.4–10.8)

## 2023-02-17 LAB — COMPREHENSIVE METABOLIC PANEL
ALT: 9 IU/L (ref 0–32)
AST: 7 IU/L (ref 0–40)
Albumin/Globulin Ratio: 1 — ABNORMAL LOW (ref 1.2–2.2)
Albumin: 3.8 g/dL — ABNORMAL LOW (ref 3.9–4.9)
Alkaline Phosphatase: 171 IU/L — ABNORMAL HIGH (ref 44–121)
BUN/Creatinine Ratio: 20 (ref 9–23)
BUN: 22 mg/dL — ABNORMAL HIGH (ref 6–20)
Bilirubin Total: <0.2 mg/dL (ref 0.0–1.2)
CO2: 19 mmol/L — ABNORMAL LOW (ref 20–29)
Calcium: 9.4 mg/dL (ref 8.7–10.2)
Chloride: 95 mmol/L — ABNORMAL LOW (ref 96–106)
Creatinine, Ser: 1.12 mg/dL — ABNORMAL HIGH (ref 0.57–1.00)
Globulin, Total: 4 g/dL (ref 1.5–4.5)
Glucose: 278 mg/dL — ABNORMAL HIGH (ref 70–99)
Potassium: 5.5 mmol/L — ABNORMAL HIGH (ref 3.5–5.2)
Sodium: 129 mmol/L — ABNORMAL LOW (ref 134–144)
Total Protein: 7.8 g/dL (ref 6.0–8.5)
eGFR: 64 mL/min/{1.73_m2} (ref >59)

## 2023-02-17 LAB — MICROALBUMIN / CREATININE URINE RATIO
Creatinine, Urine: 139.9 mg/dL
Microalb/Creat Ratio: 422 mg/g creat — ABNORMAL HIGH (ref 0–29)
Microalbumin, Urine: 590.9 ug/mL

## 2023-02-17 LAB — TSH+FREE T4
Free T4: 1.35 ng/dL (ref 0.82–1.77)
TSH: 1.25 u[IU]/mL (ref 0.450–4.500)

## 2023-02-17 LAB — LIPID PANEL
Chol/HDL Ratio: 3.5 ratio (ref 0.0–4.4)
Cholesterol, Total: 214 mg/dL — ABNORMAL HIGH (ref 100–199)
HDL: 62 mg/dL (ref >39)
LDL Chol Calc (NIH): 113 mg/dL — ABNORMAL HIGH (ref 0–99)
Triglycerides: 229 mg/dL — ABNORMAL HIGH (ref 0–149)
VLDL Cholesterol Cal: 39 mg/dL (ref 5–40)

## 2023-02-17 LAB — URIC ACID: Uric Acid: 5.9 mg/dL (ref 2.6–6.2)

## 2023-02-21 ENCOUNTER — Telehealth: Payer: Self-pay

## 2023-02-21 NOTE — Telephone Encounter (Signed)
Pt returned our call for lab results. Share provider's note.   [place] are anemic, and it looks like you have been on and off in the past and that it was considered to be iron deficiency related. I will refer you back to hematology for this.   Your A1c is still high, but better than before. The referral to endocrinology is already placed.   Your electrolytes are a little abnormal, you are likely dehydrated.  I recommend drinking at least 64 oz of water daily. Lets repeat this bloodwork in a month.  Written by Mikey Kirschner, PA-C on 02/18/2023  8:21 AM EDT  Pt would like to see a rheumatologist for pain. Pt would like pain medication. Pt also requested  a muscle relaxer in order to sleep.  Pt uses Product/process development scientist on [employer] Please advise.

## 2023-02-22 ENCOUNTER — Other Ambulatory Visit: Payer: Self-pay | Admitting: Physician Assistant

## 2023-02-22 MED ORDER — BACLOFEN 10 MG PO TABS: 10.0000 mg | ORAL_TABLET | Freq: Three times a day (TID) | ORAL | 0 refills | Status: DC | PRN

## 2023-02-22 NOTE — Telephone Encounter (Signed)
Advised 

## 2023-02-28 ENCOUNTER — Encounter: Payer: Self-pay | Admitting: Physician Assistant

## 2023-03-01 ENCOUNTER — Telehealth: Payer: Self-pay | Admitting: Physician Assistant

## 2023-03-01 ENCOUNTER — Other Ambulatory Visit: Payer: Self-pay | Admitting: Physician Assistant

## 2023-03-01 DIAGNOSIS — G8929 Other chronic pain: Secondary | ICD-10-CM

## 2023-03-01 NOTE — Telephone Encounter (Signed)
MyChart letter sent

## 2023-03-04 ENCOUNTER — Telehealth: Payer: Medicaid Other | Admitting: Physician Assistant

## 2023-03-04 ENCOUNTER — Other Ambulatory Visit: Payer: Self-pay

## 2023-03-04 DIAGNOSIS — D509 Iron deficiency anemia, unspecified: Secondary | ICD-10-CM

## 2023-03-04 NOTE — Progress Notes (Signed)
Yes - sent

## 2023-03-04 NOTE — Progress Notes (Deleted)
    I,Sha'taria Amory Zbikowski,acting as a Neurosurgeon for [employer], PA-C.,have documented all relevant documentation on the behalf of Alfredia Ferguson, PA-C,as directed by  Alfredia Ferguson, PA-C while in the presence of Alfredia Ferguson, PA-C.   MyChart Video Visit    Virtual Visit via Video Note   This format is felt to be most appropriate for this patient at this time. Physical exam was limited by quality of the video and audio technology used for the visit.   Patient location: *** Provider location: [facility]  I discussed the limitations of evaluation and management by telemedicine and the availability of in person appointments. The patient expressed understanding and agreed to proceed.  Patient: Theresa Malone   DOB: 1981-07-01   42 y.o. Female  MRN: [id number] Visit Date: 03/04/2023  Today's healthcare provider: Alfredia Ferguson, PA-C   No chief complaint on file.  Subjective    HPI  ***   Medications: Outpatient Medications Prior to Visit  Medication Sig   baclofen (LIORESAL) 10 MG tablet Take 1 tablet (10 mg total) by mouth 3 (three) times daily as needed for muscle spasms.   acetaminophen (TYLENOL) 650 MG CR tablet Take 650 mg by mouth every 8 (eight) hours as needed for pain.   BD PEN NEEDLE MICRO U/F 32G X 6 MM MISC Inject into the skin daily.   Continuous Blood Gluc Receiver (FREESTYLE LIBRE 2 READER) DEVI USE TO CHECK GLUCOSE AS DIRECTED   Continuous Blood Gluc Sensor (FREESTYLE LIBRE 2 SENSOR) MISC USE AS DIRECTED TO CHECK BLOOD SUGAR AND CHANGE EVERY 14 DAYS   escitalopram (LEXAPRO) 20 MG tablet Take 40 mg by mouth daily.   gabapentin (NEURONTIN) 300 MG capsule Take 300 mg by mouth 2 (two) times daily.   insulin aspart (NOVOLOG FLEXPEN) 100 UNIT/ML FlexPen CBG 70 - 120: 0 units CBG 121 - 150: 1 unit CBG 151 - 200: 2 units CBG 201 - 250: 3 units CBG 251 - 300: 5 units CBG 301 - 350: 7 units CBG 351 - 400: 9 units CBG > 400: call MD   metFORMIN (GLUCOPHAGE) 1000  MG tablet Take 1,000 mg by mouth 2 (two) times daily with a meal.   metoprolol tartrate (LOPRESSOR) 50 MG tablet Take 1 tablet (50 mg total) by mouth 2 (two) times daily.   naproxen sodium (ALEVE) 220 MG tablet Take 220 mg by mouth.   No facility-administered medications prior to visit.    Review of Systems  {Labs  Heme  Chem  Endocrine  Serology  Results Review (optional):23779}   Objective    LMP 08/26/2022 (Within Months)   {Show previous vital signs (optional):23777}   Physical Exam     Assessment & Plan     ***  No follow-ups on file.     I discussed the assessment and treatment plan with the patient. The patient was provided an opportunity to ask questions and all were answered. The patient agreed with the plan and demonstrated an understanding of the instructions.   The patient was advised to call back or seek an in-person evaluation if the symptoms worsen or if the condition fails to improve as anticipated.  I provided *** minutes of non-face-to-face time during this encounter.  {provider attestation***:1}  Alfredia Ferguson, PA-C [facility] [phone] (phone) [phone] (fax)  [facility]

## 2023-03-07 ENCOUNTER — Inpatient Hospital Stay: Payer: Medicaid Other | Admitting: Internal Medicine

## 2023-03-08 ENCOUNTER — Ambulatory Visit: Payer: Self-pay

## 2023-03-08 ENCOUNTER — Other Ambulatory Visit: Payer: Self-pay

## 2023-03-08 MED ORDER — BACLOFEN 10 MG PO TABS: 10.0000 mg | ORAL_TABLET | Freq: Three times a day (TID) | ORAL | 0 refills | Status: DC | PRN

## 2023-03-08 NOTE — Telephone Encounter (Signed)
Requested medications are due for refill today.  yes  Requested medications are on the active medications list.  yes  Last refill. 02/22/2023 #21 0 rf  Future visit scheduled.   yes  Notes to clinic.  Refill not delegated. PT called requesting refill d/t pain.    Requested Prescriptions  Pending Prescriptions Disp Refills   baclofen (LIORESAL) 10 MG tablet 21 each 0    Sig: Take 1 tablet (10 mg total) by mouth 3 (three) times daily as needed for muscle spasms.     Analgesics:  Muscle Relaxants - baclofen Failed - 03/08/2023  4:15 PM      Failed - Cr in normal range and within 180 days    Creatinine, Ser  Date Value Ref Range Status  02/15/2023 1.12 (H) 0.57 - 1.00 mg/dL Final   Creatinine,U  Date Value Ref Range Status  07/14/2014 236.8 mg/dL Final    Comment:      Cutoff Values for Urine Drug Screen:         Drug Class           Cutoff (ng/mL)         Amphetamines            1000         Barbiturates             200         Cocaine Metabolites      300         Benzodiazepines          200         Methadone                300         Opiates                 2000         Phencyclidine             25         Propoxyphene             300         Marijuana Metabolites     50   For medical purposes only.   Creatinine, Urine  Date Value Ref Range Status  02/05/2015 51.00 mg/dL Final         Passed - eGFR is 30 or above and within 180 days    GFR calc Af Amer  Date Value Ref Range Status  05/22/2019 >60 >60 mL/min Final   GFR, Estimated  Date Value Ref Range Status  10/24/2022 48 (L) >60 mL/min Final    Comment:    (NOTE) Calculated using the CKD-EPI Creatinine Equation (2021)    eGFR  Date Value Ref Range Status  02/15/2023 64 >59 mL/min/1.73 Final         Passed - Valid encounter within last 6 months    Recent Outpatient Visits           3 weeks ago Type 2 diabetes mellitus with stage 3a chronic kidney disease, with long-term current use of insulin (HCC)    [facility] Alfredia Ferguson, PA-C       Future Appointments             In 1 month Drub[place], [facility]   In 1 month [place], MD [facility] Endocrinology

## 2023-03-08 NOTE — Telephone Encounter (Signed)
  Chief Complaint: Pain - unable to use arms - left leg Symptoms: above Frequency: ongoing Pertinent Negatives: Patient denies  Disposition: [x] ED /[] Urgent Care (no appt availability in office) / [] Appointment(In office/virtual)/ []  [place] [facility]/ [] Home Care/ [] Refused Recommended Disposition /[] [facility]/ []  Follow-up with PCP Additional Notes: PT is requesting a refill of pain medication. She would like baclofen. I have sent refill request to the office for refill.  PT is also following up on pain clinic referral. Pt has not heard from them. PT may go to the ED this weekend if needed for pain medication. Please advise.    Summary: Pt has questions about something that she can take to help with the pain   Pt stated that she is becoming more restricted at night due to her pain. Pt would like to know if there is something that she can do or something that she can take to help with the pain. Pt requests call back from a triage nurse. Cb#  [phone]     Reason for Disposition  Prescription request for new medicine (not a refill)  Answer Assessment - Initial Assessment Questions 1. DRUG NAME: "What medicine do you need to have refilled?"     baclofen 2. REFILLS REMAINING: "How many refills are remaining?" (Note: The label on the medicine or pill bottle will show how many refills are remaining. If there are no refills remaining, then a renewal may be needed.)     none 3. EXPIRATION DATE: "What is the expiration date?" (Note: The label states when the prescription will expire, and thus can no longer be refilled.)     none 4. PRESCRIBING HCP: "Who prescribed it?" Reason: If prescribed by specialist, call should be referred to that group.     [place] 5. SYMPTOMS: "Do you have any symptoms?"     Pain  Protocols used: Medication Refill and Renewal Call-A-AH

## 2023-03-11 NOTE — Telephone Encounter (Signed)
Advised 

## 2023-03-12 ENCOUNTER — Inpatient Hospital Stay: Payer: Medicaid Other | Admitting: Internal Medicine

## 2023-03-13 ENCOUNTER — Ambulatory Visit: Payer: Self-pay | Admitting: Physician Assistant

## 2023-03-21 ENCOUNTER — Inpatient Hospital Stay: Payer: Medicaid Other | Admitting: Internal Medicine

## 2023-03-28 ENCOUNTER — Ambulatory Visit (INDEPENDENT_AMBULATORY_CARE_PROVIDER_SITE_OTHER): Payer: Medicaid Other | Admitting: Physician Assistant

## 2023-03-28 ENCOUNTER — Ambulatory Visit: Payer: Self-pay | Admitting: *Deleted

## 2023-03-28 VITALS — BP 161/95 | HR 108 | Temp 98.1°F | Wt 234.0 lb

## 2023-03-28 DIAGNOSIS — M1A9XX0 Chronic gout, unspecified, without tophus (tophi): Secondary | ICD-10-CM

## 2023-03-28 DIAGNOSIS — M79644 Pain in right finger(s): Secondary | ICD-10-CM

## 2023-03-28 DIAGNOSIS — G8929 Other chronic pain: Secondary | ICD-10-CM

## 2023-03-28 DIAGNOSIS — D509 Iron deficiency anemia, unspecified: Secondary | ICD-10-CM | POA: Diagnosis not present

## 2023-03-28 DIAGNOSIS — M79645 Pain in left finger(s): Secondary | ICD-10-CM

## 2023-03-28 DIAGNOSIS — Z794 Long term (current) use of insulin: Secondary | ICD-10-CM

## 2023-03-28 DIAGNOSIS — N1831 Chronic kidney disease, stage 3a: Secondary | ICD-10-CM

## 2023-03-28 DIAGNOSIS — M25449 Effusion, unspecified hand: Secondary | ICD-10-CM

## 2023-03-28 DIAGNOSIS — I152 Hypertension secondary to endocrine disorders: Secondary | ICD-10-CM

## 2023-03-28 DIAGNOSIS — E1122 Type 2 diabetes mellitus with diabetic chronic kidney disease: Secondary | ICD-10-CM | POA: Diagnosis not present

## 2023-03-28 DIAGNOSIS — D649 Anemia, unspecified: Secondary | ICD-10-CM

## 2023-03-28 DIAGNOSIS — M7989 Other specified soft tissue disorders: Secondary | ICD-10-CM

## 2023-03-28 DIAGNOSIS — M2550 Pain in unspecified joint: Secondary | ICD-10-CM | POA: Diagnosis not present

## 2023-03-28 MED ORDER — CYCLOBENZAPRINE HCL 5 MG PO TABS: 5.0000 mg | ORAL_TABLET | Freq: Three times a day (TID) | ORAL | 0 refills | Status: DC | PRN

## 2023-03-28 NOTE — Progress Notes (Signed)
I,J'ya E Hunter,acting as a scribe for [employer], PA-C.,have documented all relevant documentation on the behalf of Theresa Lat, PA-C,as directed by  [employer], PA-C while in the presence of [employer], PA-C.   Established patient visit   Patient: Theresa Malone   DOB: December 28, 1980   42 y.o. Female  MRN: [id number] Visit Date: 03/28/2023  Today's healthcare provider: [name], PA-C   Chief Complaint  Patient presents with   Edema   Subjective    HPI  Edema: Patient complains of edema. The location of the edema is arm(s) bilateral, wrist(s) bilateral, hand(s) bilateral,  all digits of  finger(s) bilateral, knee(s) bilateral, lower leg(s) bilateral, ankle(s) bilateral, feet bilateral.  The edema has been generalized.  Onset of symptoms was 5 days ago, gradually worsening since that time. The edema is present all day. The patient states the problem is long-standing.  The swelling has been aggravated by nothing, relieved by elevation of involved area, and been associated with shortness of breath and nausea, vomiting, diarrhea, headaches, visual disturbances, and fatigue . Cardiac risk factors include diabetes mellitus, hypertension, and obesity (BMI >= 30 kg/m2).   Medications: Outpatient Medications Prior to Visit  Medication Sig   acetaminophen (TYLENOL) 650 MG CR tablet Take 650 mg by mouth every 8 (eight) hours as needed for pain.   baclofen (LIORESAL) 10 MG tablet Take 1 tablet (10 mg total) by mouth 3 (three) times daily as needed for muscle spasms.   BD PEN NEEDLE MICRO U/F 32G X 6 MM MISC Inject into the skin daily.   Continuous Blood Gluc Receiver (FREESTYLE LIBRE 2 READER) DEVI USE TO CHECK GLUCOSE AS DIRECTED   Continuous Blood Gluc Sensor (FREESTYLE LIBRE 2 SENSOR) MISC USE AS DIRECTED TO CHECK BLOOD SUGAR AND CHANGE EVERY 14 DAYS   escitalopram (LEXAPRO) 20 MG tablet Take 40 mg by mouth daily.   gabapentin (NEURONTIN) 300 MG capsule Take 300 mg by mouth 2  (two) times daily.   insulin aspart (NOVOLOG FLEXPEN) 100 UNIT/ML FlexPen CBG 70 - 120: 0 units CBG 121 - 150: 1 unit CBG 151 - 200: 2 units CBG 201 - 250: 3 units CBG 251 - 300: 5 units CBG 301 - 350: 7 units CBG 351 - 400: 9 units CBG > 400: call MD   metFORMIN (GLUCOPHAGE) 1000 MG tablet Take 1,000 mg by mouth 2 (two) times daily with a meal.   metoprolol tartrate (LOPRESSOR) 50 MG tablet Take 1 tablet (50 mg total) by mouth 2 (two) times daily.   naproxen sodium (ALEVE) 220 MG tablet Take 220 mg by mouth. (Patient not taking: Reported on 03/28/2023)   No facility-administered medications prior to visit.    Review of Systems  Constitutional:  Positive for appetite change and fatigue.  HENT:  Positive for sore throat.   Eyes:  Positive for visual disturbance.  Respiratory:  Positive for cough, choking, chest tightness and shortness of breath.   Cardiovascular:  Positive for chest pain.  Gastrointestinal:  Positive for abdominal distention, diarrhea, nausea and vomiting.  Musculoskeletal:  Positive for gait problem, joint swelling and myalgias.  Allergic/Immunologic: Positive for environmental allergies.  Neurological:  Positive for dizziness, weakness, light-headedness, numbness and headaches.       Objective    BP (!) 161/95 (BP Location: Right Arm, Patient Position: Sitting, Cuff Size: Large)   Pulse (!) 108   Temp 98.1 F (36.7 C) (Oral)   Wt 234 lb (106.1 kg)   SpO2  100%   BMI 44.21 kg/m    Physical Exam Vitals reviewed.  Constitutional:      General: She is not in acute distress.    Appearance: Normal appearance. She is well-developed. She is not diaphoretic.  HENT:     Head: Normocephalic and atraumatic.  Eyes:     General: No scleral icterus.    Conjunctiva/sclera: Conjunctivae normal.  Neck:     Thyroid: No thyromegaly.  Cardiovascular:     Rate and Rhythm: Normal rate and regular rhythm.     Pulses: Normal pulses.     Heart sounds: Normal heart sounds. No  murmur heard. Pulmonary:     Effort: Pulmonary effort is normal. No respiratory distress.     Breath sounds: Normal breath sounds. No wheezing, rhonchi or rales.  Musculoskeletal:     Cervical back: Neck supple.     Right lower leg: Edema (ankle, mild) present.     Left lower leg: Edema (ankle, mild) present.  Lymphadenopathy:     Cervical: No cervical adenopathy.  Skin:    General: Skin is warm and dry.     Findings: No rash.  Neurological:     Mental Status: She is alert and oriented to person, place, and time. Mental status is at baseline.  Psychiatric:        Mood and Affect: Mood normal.        Behavior: Behavior normal.      No results found for any visits on 03/28/23.  Assessment & Plan     1. Hypertension associated with diabetes (HCC) Chronic and unstable. Her BP today is elevated despite her claim of taking her BP medication. Her previous medications were discontinued. Advised to start back on losartan 25 mg BID , starting with 25 mg daily and continue taking metoprolol. Her CMP was abnormal a mo ago Ordered again: - Comprehensive metabolic panel Will reassess a need for diuretics. BP could be high due to her chronic pain.  2. Chronic joint pain Chronic long-standing problem Pt requested something to relieve her pain. Continue her current pain medications, proceed with pain management Continue with OTC pain medications, gabapentin and muscle relaxant Flexeril was prescribed for muscle spasms Ordered labs to rule out autoimmune process vs infectious process vs inflammatory Ordered XR if pt agrees Will reassess after  receiving lab results Will refer to rheumatology depending on lab results.   3. Chronic gout without tophus, unspecified cause, unspecified site In the past her uric aid was abnormal. Will check her current uric acid to rule out gout Could be a flare up after red meat with alcohol meal Will reassess after receiving lab results Will closely  monitor in the setting of DMII Will reassess treatment of acute flare? In the setting of DMII, could not prescribe prednisone.   4. Leg swelling, ankle Chronic No pitting edema, no warmth or redness, with normal lung sounds on auscultation However, in the setting of elevated BP, will check BNP Red flags and precautions were discussed. The patient was advised to call back or seek an in-person evaluation if the symptoms worsen or if the condition fails to improve as anticipated.  No follow-ups on file. Pt was advised to see her PCP on Monday.   I discussed the assessment and treatment plan with the patient. The patient was provided an opportunity to ask questions and all were answered. The patient agreed with the plan and demonstrated an understanding of the instructions.  I, Theresa Lat, PA-C have reviewed all  documentation for this visit. The documentation on  03/28/23 for the exam, diagnosis, procedures, and orders are all accurate and complete.  Theresa Malone, [facility], MMS [facility] [phone] (phone) [phone] (fax)  [facility]

## 2023-03-28 NOTE — Telephone Encounter (Signed)
  Chief Complaint: bilateral hand swelling Symptoms: hand/finger swelling- ongoing but getting worse, ankle swelling Frequency: chronic but worse, patient also states she has cough, chest pain at time-especially if she gets upset Pertinent Negatives: Patient denies blurred vision, difficulty breathing, headache Disposition: [] ED /[] Urgent Care (no appt availability in office) / [x] Appointment(In office/virtual)/ []  [facility]/ [] Home Care/ [] Refused Recommended Disposition /[] [facility]/ []  Follow-up with PCP Additional Notes: Patient states she is at work and having difficulty using her hands. Patient request late appointment as she gets off at 3:00- patient has been scheduled.   Reason for Disposition  SEVERE hand swelling (e.g., swelling of entire hand and up into forearm)  Answer Assessment - Initial Assessment Questions 1. ONSET: "When did the swelling start?" (e.g., minutes, hours, days)     Ongoing- swelling 2. LOCATION: "What part of the hand is swollen?"  "Are both hands swollen or just one hand?"     Bilateral hand swelling 3. SEVERITY: "How bad is the swelling?" (e.g., localized; mild, moderate, severe)   - BALL OR LUMP: small ball or lump   - LOCALIZED: puffy or swollen area or patch of skin   - JOINT SWELLING: swelling of a joint   - MILD: puffiness or mild swelling of fingers or hand   - MODERATE: fingers and hand are swollen   - SEVERE: swelling of entire hand and up into forearm     Joint swelling- R middle finger, whole left hand 4. REDNESS: "Does the swelling look red or infected?"     no 5. PAIN: "Is the swelling painful to touch?" If Yes, ask: "How painful is it?"   (Scale 1-10; mild, moderate or severe)     Yes- severe-7 6. FEVER: "Do you have a fever?" If Yes, ask: "What is it, how was it measured, and when did it start?"      no 7. CAUSE: "What do you think is causing the hand swelling?" (e.g., heat, insect bite, pregnancy, recent  injury)     Patient thinks she has arthritis  8. MEDICAL HISTORY: "Do you have a history of heart failure, kidney disease, liver failure, or cancer?"     Heart disease  9. RECURRENT SYMPTOM: "Have you had hand swelling before?" If Yes, ask: "When was the last time?" "What happened that time?"     Yes- chronic 10. OTHER SYMPTOMS: "Do you have any other symptoms?" (e.g., blurred vision, difficulty breathing, headache)       Cough, chest pain  Protocols used: Hand Swelling-A-AH

## 2023-03-29 ENCOUNTER — Other Ambulatory Visit: Payer: Self-pay | Admitting: Physician Assistant

## 2023-03-29 MED ORDER — LOSARTAN POTASSIUM 25 MG PO TABS
25.0000 mg | ORAL_TABLET | Freq: Every day | ORAL | 0 refills | Status: DC
Start: 2023-03-29 — End: 2023-04-09

## 2023-04-01 ENCOUNTER — Encounter: Payer: Self-pay | Admitting: Physician Assistant

## 2023-04-01 ENCOUNTER — Ambulatory Visit (INDEPENDENT_AMBULATORY_CARE_PROVIDER_SITE_OTHER): Payer: Medicaid Other | Admitting: Physician Assistant

## 2023-04-01 VITALS — BP 155/99 | HR 120 | Temp 97.6°F | Resp 12 | Wt 236.0 lb

## 2023-04-01 DIAGNOSIS — E1159 Type 2 diabetes mellitus with other circulatory complications: Secondary | ICD-10-CM

## 2023-04-01 DIAGNOSIS — I152 Hypertension secondary to endocrine disorders: Secondary | ICD-10-CM

## 2023-04-01 DIAGNOSIS — M2550 Pain in unspecified joint: Secondary | ICD-10-CM

## 2023-04-01 DIAGNOSIS — R29898 Other symptoms and signs involving the musculoskeletal system: Secondary | ICD-10-CM

## 2023-04-01 DIAGNOSIS — N1831 Chronic kidney disease, stage 3a: Secondary | ICD-10-CM

## 2023-04-01 DIAGNOSIS — E1122 Type 2 diabetes mellitus with diabetic chronic kidney disease: Secondary | ICD-10-CM

## 2023-04-01 DIAGNOSIS — G8929 Other chronic pain: Secondary | ICD-10-CM

## 2023-04-01 DIAGNOSIS — Z794 Long term (current) use of insulin: Secondary | ICD-10-CM

## 2023-04-01 MED ORDER — METOPROLOL TARTRATE 50 MG PO TABS: 50.0000 mg | ORAL_TABLET | Freq: Two times a day (BID) | ORAL | 1 refills | Status: DC

## 2023-04-01 MED ORDER — GABAPENTIN 300 MG PO CAPS
600.0000 mg | ORAL_CAPSULE | Freq: Two times a day (BID) | ORAL | 0 refills | Status: DC
Start: 2023-04-01 — End: 2023-06-06

## 2023-04-01 NOTE — Patient Instructions (Signed)
Make an appointment: [facility] at [facility] [address], [place], [place] [address] [phone]

## 2023-04-01 NOTE — Progress Notes (Signed)
I,Sulibeya S Dimas,acting as a Neurosurgeon for [employer], PA-C.,have documented all relevant documentation on the behalf of Alfredia Ferguson, PA-C,as directed by  Alfredia Ferguson, PA-C while in the presence of Alfredia Ferguson, PA-C.     Established patient visit   Patient: Theresa Malone   DOB: 1981-07-08   42 y.o. Female  MRN: [id number] Visit Date: 04/01/2023  Today's healthcare provider: Alfredia Ferguson, PA-C   Chief Complaint  Patient presents with   Follow-up   Subjective    HPI  Follow up for chronic joint pain  The patient was last seen for this 4 days ago. Changes made at last visit include continue with OTC pain medications, gabapentin and muscle relaxant.  She reports excellent compliance with treatment. She feels that condition is Worse. She reports pain all over her body. She reports not being able to lift arms, left worse than right. She reports swelling on her hands.  She is not having side effects.   Pt reports she has weakness in her hands, no grip strength, cannot raise her arms above her head. She is emotional in that she cannot work or take care of her family. She is requesting to be permanently off from work.   -----------------------------------------------------------------------------------------   Medications: Outpatient Medications Prior to Visit  Medication Sig   acetaminophen (TYLENOL) 650 MG CR tablet Take 650 mg by mouth every 8 (eight) hours as needed for pain.   BD PEN NEEDLE MICRO U/F 32G X 6 MM MISC Inject into the skin daily.   Continuous Blood Gluc Receiver (FREESTYLE LIBRE 2 READER) DEVI USE TO CHECK GLUCOSE AS DIRECTED   Continuous Blood Gluc Sensor (FREESTYLE LIBRE 2 SENSOR) MISC USE AS DIRECTED TO CHECK BLOOD SUGAR AND CHANGE EVERY 14 DAYS   cyclobenzaprine (FLEXERIL) 5 MG tablet Take 1 tablet (5 mg total) by mouth 3 (three) times daily as needed for muscle spasms.   escitalopram (LEXAPRO) 20 MG tablet Take 40 mg by mouth daily.   insulin  aspart (NOVOLOG FLEXPEN) 100 UNIT/ML FlexPen CBG 70 - 120: 0 units CBG 121 - 150: 1 unit CBG 151 - 200: 2 units CBG 201 - 250: 3 units CBG 251 - 300: 5 units CBG 301 - 350: 7 units CBG 351 - 400: 9 units CBG > 400: call MD   losartan (COZAAR) 25 MG tablet Take 1 tablet (25 mg total) by mouth daily.   metFORMIN (GLUCOPHAGE) 1000 MG tablet Take 1,000 mg by mouth 2 (two) times daily with a meal.   naproxen sodium (ALEVE) 220 MG tablet Take 220 mg by mouth.   [DISCONTINUED] gabapentin (NEURONTIN) 300 MG capsule Take 300 mg by mouth 2 (two) times daily.   [DISCONTINUED] metoprolol tartrate (LOPRESSOR) 50 MG tablet Take 1 tablet (50 mg total) by mouth 2 (two) times daily.   No facility-administered medications prior to visit.    Review of Systems  Constitutional:  Positive for fatigue. Negative for fever.  Respiratory:  Negative for cough and shortness of breath.   Cardiovascular:  Negative for chest pain and leg swelling.  Gastrointestinal:  Negative for abdominal pain.  Musculoskeletal:  Positive for arthralgias, back pain, gait problem, joint swelling, myalgias, neck pain and neck stiffness.  Neurological:  Positive for weakness. Negative for dizziness and headaches.       Objective    BP (!) 155/99 (BP Location: Right Arm, Patient Position: Sitting, Cuff Size: Large)   Pulse (!) 120   Temp 97.6 F (36.4 C) (Temporal)  Resp 12   Wt 236 lb (107 kg)   SpO2 100%   BMI 44.59 kg/m    Physical Exam Vitals reviewed.  Constitutional:      Appearance: She is obese. She is not ill-appearing.  HENT:     Head: Normocephalic.  Eyes:     Conjunctiva/sclera: Conjunctivae normal.  Cardiovascular:     Rate and Rhythm: Normal rate.  Pulmonary:     Effort: Pulmonary effort is normal. No respiratory distress.  Neurological:     General: No focal deficit present.     Mental Status: She is alert and oriented to person, place, and time.  Psychiatric:        Mood and Affect: Mood normal.         Behavior: Behavior normal.      No results found for any visits on 04/01/23.  Assessment & Plan     Problem List Items Addressed This Visit       Cardiovascular and Mediastinum   Hypertension associated with diabetes (HCC) - Primary    Pt was started on additional losartan 25 mg , but states she was unaware of this and never picked it up from the pharmacy. Advised strongly to start.  She also said she is out of her metoprolol. Refilled. F/u 2 weeks       Relevant Medications   metoprolol tartrate (LOPRESSOR) 50 MG tablet   Other Relevant Orders   Ambulatory referral to Social Work     Endocrine   Type II diabetes mellitus with renal manifestations (HCC)   Relevant Orders   Ambulatory referral to Social Work   Other Visit Diagnoses     Upper extremity weakness       Relevant Orders   Ambulatory referral to Neurology   Ambulatory referral to Social Work   Chronic joint pain       Relevant Medications   gabapentin (NEURONTIN) 300 MG capsule   Other Relevant Orders   Ambulatory referral to Social Work      2. Upper extremity weakness Pt was advised last week to get labs; she did not. Printed. Autoimmune/inflammatory w/u  Referring to neuro  - Ambulatory referral to Neurology - Ambulatory referral to Social Work  3. Chronic joint pain Increase gaba to 600 mg BID.  Pt already has referral for pain management.  - gabapentin (NEURONTIN) 300 MG capsule; Take 2 capsules (600 mg total) by mouth 2 (two) times daily.  Dispense: 120 capsule; Refill: 0 - Ambulatory referral to Social Work  4. Type 2 diabetes mellitus with stage 3a chronic kidney disease, with long-term current use of insulin (HCC) - Ambulatory referral to Social Work  Reminded pt up coming appts. Stressed the importance of going to heme as she is fairly anemic. She was unaware of this appointment.  She has upcoming endo; reminded to call and schedule w/ pain management.  Referring to neuro today as  well for her weakness   Advised pt in order for [place] to help her, she has to participate in her care. Lab work and w/u of her pain and chronic issues is integral to her care.   Return in about 2 weeks (around 04/15/2023) for hypertension.     I, Alfredia Ferguson, PA-C have reviewed all documentation for this visit. The documentation on  04/01/23   for the exam, diagnosis, procedures, and orders are all accurate and complete.  Alfredia Ferguson, PA-C [facility] [address] [place], [place], [address] Office: [phone] Fax:  [phone]   [facility]

## 2023-04-01 NOTE — Assessment & Plan Note (Signed)
Pt was started on additional losartan 25 mg , but states she was unaware of this and never picked it up from the pharmacy. Advised strongly to start.  She also said she is out of her metoprolol. Refilled. F/u 2 weeks

## 2023-04-02 ENCOUNTER — Other Ambulatory Visit: Payer: Self-pay | Admitting: Physician Assistant

## 2023-04-02 DIAGNOSIS — G8929 Other chronic pain: Secondary | ICD-10-CM

## 2023-04-02 DIAGNOSIS — R768 Other specified abnormal immunological findings in serum: Secondary | ICD-10-CM

## 2023-04-02 DIAGNOSIS — R7982 Elevated C-reactive protein (CRP): Secondary | ICD-10-CM

## 2023-04-02 DIAGNOSIS — R29898 Other symptoms and signs involving the musculoskeletal system: Secondary | ICD-10-CM

## 2023-04-02 LAB — COMPREHENSIVE METABOLIC PANEL
ALT: 12 IU/L (ref 0–32)
AST: 9 IU/L (ref 0–40)
Albumin/Globulin Ratio: 0.9 — ABNORMAL LOW (ref 1.2–2.2)
Albumin: 3.6 g/dL — ABNORMAL LOW (ref 3.9–4.9)
Alkaline Phosphatase: 182 IU/L — ABNORMAL HIGH (ref 44–121)
BUN/Creatinine Ratio: 15 (ref 9–23)
BUN: 22 mg/dL — ABNORMAL HIGH (ref 6–20)
Bilirubin Total: 0.3 mg/dL (ref 0.0–1.2)
CO2: 20 mmol/L (ref 20–29)
Calcium: 9.3 mg/dL (ref 8.7–10.2)
Chloride: 92 mmol/L — ABNORMAL LOW (ref 96–106)
Creatinine, Ser: 1.45 mg/dL — ABNORMAL HIGH (ref 0.57–1.00)
Globulin, Total: 4.1 g/dL (ref 1.5–4.5)
Glucose: 389 mg/dL — ABNORMAL HIGH (ref 70–99)
Potassium: 5 mmol/L (ref 3.5–5.2)
Sodium: 129 mmol/L — ABNORMAL LOW (ref 134–144)
Total Protein: 7.7 g/dL (ref 6.0–8.5)
eGFR: 47 mL/min/{1.73_m2} — ABNORMAL LOW (ref >59)

## 2023-04-02 LAB — CBC WITH DIFFERENTIAL/PLATELET
Eos: 2 %
Immature Grans (Abs): 0.1 10*3/uL (ref 0.0–0.1)
Lymphs: 17 %
MCV: 78 fL — ABNORMAL LOW (ref 79–97)

## 2023-04-02 LAB — IRON,TIBC AND FERRITIN PANEL
Ferritin: 176 ng/mL — ABNORMAL HIGH (ref 15–150)
Iron Saturation: 14 % — ABNORMAL LOW (ref 15–55)
Iron: 41 ug/dL (ref 27–159)
Total Iron Binding Capacity: 291 ug/dL (ref 250–450)
UIBC: 250 ug/dL (ref 131–425)

## 2023-04-02 LAB — PRO B NATRIURETIC PEPTIDE: NT-Pro BNP: 356 pg/mL — ABNORMAL HIGH (ref 0–130)

## 2023-04-03 ENCOUNTER — Other Ambulatory Visit: Payer: Self-pay | Admitting: Physician Assistant

## 2023-04-03 DIAGNOSIS — I152 Hypertension secondary to endocrine disorders: Secondary | ICD-10-CM

## 2023-04-03 LAB — CBC WITH DIFFERENTIAL/PLATELET
Basophils Absolute: 0 10*3/uL (ref 0.0–0.2)
Basos: 0 %
EOS (ABSOLUTE): 0.3 10*3/uL (ref 0.0–0.4)
Hematocrit: 28.5 % — ABNORMAL LOW (ref 34.0–46.6)
Hemoglobin: 8.6 g/dL — ABNORMAL LOW (ref 11.1–15.9)
Immature Granulocytes: 1 %
Lymphocytes Absolute: 2 10*3/uL (ref 0.7–3.1)
MCH: 23.6 pg — ABNORMAL LOW (ref 26.6–33.0)
MCHC: 30.2 g/dL — ABNORMAL LOW (ref 31.5–35.7)
Monocytes Absolute: 0.8 10*3/uL (ref 0.1–0.9)
Monocytes: 7 %
Neutrophils Absolute: 8.7 10*3/uL — ABNORMAL HIGH (ref 1.4–7.0)
Neutrophils: 73 %
Platelets: 487 10*3/uL — ABNORMAL HIGH (ref 150–450)
RBC: 3.64 x10E6/uL — ABNORMAL LOW (ref 3.77–5.28)
RDW: 15.9 % — ABNORMAL HIGH (ref 11.7–15.4)
WBC: 12 10*3/uL — ABNORMAL HIGH (ref 3.4–10.8)

## 2023-04-03 LAB — ANA W/REFLEX IF POSITIVE: Anti Nuclear Antibody (ANA): NEGATIVE

## 2023-04-03 LAB — C-REACTIVE PROTEIN: CRP: 99 mg/L — ABNORMAL HIGH (ref 0–10)

## 2023-04-03 LAB — RHEUMATOID FACTOR: Rheumatoid fact SerPl-aCnc: 14.2 IU/mL — ABNORMAL HIGH (ref <14.0)

## 2023-04-03 LAB — CYCLIC CITRUL PEPTIDE ANTIBODY, IGG/IGA: Cyclic Citrullin Peptide Ab: 7 units (ref 0–19)

## 2023-04-03 LAB — SEDIMENTATION RATE: Sed Rate: 81 mm/hr — ABNORMAL HIGH (ref 0–32)

## 2023-04-03 LAB — URIC ACID: Uric Acid: 4.8 mg/dL (ref 2.6–6.2)

## 2023-04-03 NOTE — Telephone Encounter (Signed)
Requested medication (s) are due for refill today: yes  Requested medication (s) are on the active medication list: yes    Last refill: 03/28/23  #15  0 refills  Future visit scheduled yes  04/09/23  Notes to clinic:Not delegated, please review. Thank you.  Requested Prescriptions  Pending Prescriptions Disp Refills   cyclobenzaprine (FLEXERIL) 5 MG tablet [Pharmacy Med Name: Cyclobenzaprine HCl 5 MG Oral Tablet] 15 tablet 0    Sig: Take 1 tablet by mouth three times daily as needed for muscle spasm     Not Delegated - Analgesics:  Muscle Relaxants Failed - 04/03/2023  2:04 PM      Failed - This refill cannot be delegated      Passed - Valid encounter within last 6 months    Recent Outpatient Visits           2 days ago Hypertension associated with diabetes [facility])   [place] [facility] Alfredia Ferguson, PA-C   6 days ago Hypertension associated with diabetes [facility])   [place] [facility] [place], [place], PA-C   1 month ago Type 2 diabetes mellitus with stage 3a chronic kidney disease, with long-term current use of insulin (HCC)   [place] [facility] Alfredia Ferguson, PA-C       Future Appointments             In 6 days [place], [place] [facility], P[facility]   In 2 weeks [place], MD [facility]

## 2023-04-04 ENCOUNTER — Inpatient Hospital Stay: Payer: Medicaid Other | Attending: Internal Medicine | Admitting: Internal Medicine

## 2023-04-04 ENCOUNTER — Encounter: Payer: Self-pay | Admitting: Physician Assistant

## 2023-04-04 ENCOUNTER — Telehealth: Payer: Self-pay | Admitting: Physician Assistant

## 2023-04-04 ENCOUNTER — Encounter: Payer: Self-pay | Admitting: Internal Medicine

## 2023-04-04 ENCOUNTER — Telehealth: Payer: Self-pay | Admitting: *Deleted

## 2023-04-04 VITALS — BP 158/99 | HR 115 | Temp 98.0°F | Wt 236.0 lb

## 2023-04-04 DIAGNOSIS — D75839 Thrombocytosis, unspecified: Secondary | ICD-10-CM | POA: Insufficient documentation

## 2023-04-04 DIAGNOSIS — N1831 Chronic kidney disease, stage 3a: Secondary | ICD-10-CM | POA: Diagnosis not present

## 2023-04-04 DIAGNOSIS — Z79899 Other long term (current) drug therapy: Secondary | ICD-10-CM | POA: Insufficient documentation

## 2023-04-04 DIAGNOSIS — D500 Iron deficiency anemia secondary to blood loss (chronic): Secondary | ICD-10-CM | POA: Diagnosis not present

## 2023-04-04 DIAGNOSIS — D72829 Elevated white blood cell count, unspecified: Secondary | ICD-10-CM | POA: Diagnosis not present

## 2023-04-04 DIAGNOSIS — D509 Iron deficiency anemia, unspecified: Secondary | ICD-10-CM | POA: Diagnosis present

## 2023-04-04 DIAGNOSIS — N183 Chronic kidney disease, stage 3 unspecified: Secondary | ICD-10-CM | POA: Insufficient documentation

## 2023-04-04 NOTE — Progress Notes (Signed)
[facility]  [phone] [phone]  ID: Theresa Malone OB: [birth year]  MR#: [id number]  AOZ#:[id number]  Patient Care Team: Alfredia Ferguson, PA-C as PCP - General (Physician Assistant) Jena Gauss Gerrit Friends, MD as Consulting Physician (Gastroenterology)  REFERRING PROVIDER: Transfer of care from [facility]  REASON FOR REFERRAL: Microcytic anemia  HPI: Theresa Malone is a 42 y.o. female With past medical history of iron deficiency anemia, diabetes, hypertension, gout, anxiety, CKD stage III seen as a transfer of care from [facility] for management of anemia.  She was last seen by Dr. Ellin Saba in May 2022.  She took iron pills during pregnancy and could not tolerate it due to constipation.  Was treated with Venofer (2 doses of 300 mg and 1 dose of 400 mg) in September 2022.  Her iron deficiency was deemed secondary to heavy menstrual period.  SPEP/IFE no monoclonal protein.  Reports heavy menstrual period on monthly basis with heavy bleeding on first 5 days since tubal ligation in 2016. She was lost to follow-up after that.  History of hidradenitis suppurativa with intermittent flareups.  Diabetes-on insulin NovoLog and metformin.  Follows with endocrinology.  Reports history of difficulty with walking and moving her arms since discharge from hospital in November 2023.  Workup was initiated by Alfredia Ferguson for her primary.  ANA negative.  Rheumatoid factor mildly elevated.  Anti-CCP negative.  ESR 81.  CRP 99.  Referral to rheumatology was placed and scheduling is pending.  She was previously using 800 mg ibuprofen 4-6 times a day.  Was started on gabapentin and Flexeril which helped with pain and has stopped ibuprofen.  Labs from 04/01/2023 reviewed.  CBC showed hemoglobin 8.6, MCV 78, WBC 12 and platelets of 487.  ESR elevated 81.  CRP 99.  Iron 41.  Saturation 14 and ferritin 176.  CMP showed creatinine 1.45 baseline.  TSH  normal.   REVIEW OF SYSTEMS:   ROS  As per HPI. Otherwise, a complete review of systems is negative.  PAST MEDICAL HISTORY: Past Medical History:  Diagnosis Date   Anxiety    panic attacks   Cellulitis    Depression    Diabetes mellitus    Gout    Hidradenitis suppurativa    Hypertension    Obesity    Venous stasis    Venous stasis dermatitis     PAST SURGICAL HISTORY: Past Surgical History:  Procedure Laterality Date   CESAREAN SECTION     C/S x 2   CESAREAN SECTION N/A 01/24/2015   Procedure: CESAREAN SECTION;  Surgeon: Catalina Antigua, MD;  Location: [facility];  Service: Obstetrics;  Laterality: N/A;   TUBAL LIGATION      FAMILY HISTORY: Family History  Problem Relation Age of Onset   Diabetes Paternal Grandfather    Cancer Paternal Grandfather        liver   Cancer Paternal Grandmother        liver   Hypertension Father    Diabetes Mother    Hypertension Mother    Diabetes Maternal Uncle    Diabetes Maternal Grandmother    Cancer Maternal Grandfather    Diabetes Daughter        boarderline    Asthma Daughter    Bronchitis Daughter    Bronchitis Son    Bronchitis Daughter     HEALTH MAINTENANCE: Social History   Tobacco Use   Smoking status: Never   Smokeless tobacco: Never  Vaping Use  Vaping Use: Never used  Substance Use Topics   Alcohol use: Not Currently    Comment: wine occ   Drug use: No     Allergies  Allergen Reactions   Sulfa Antibiotics Hives   Lisinopril Cough    cough   Metformin Hcl Other (See Comments) and Nausea And Vomiting    Current Outpatient Medications  Medication Sig Dispense Refill   acetaminophen (TYLENOL) 650 MG CR tablet Take 650 mg by mouth every 8 (eight) hours as needed for pain.     BD PEN NEEDLE MICRO U/F 32G X 6 MM MISC Inject into the skin daily.     Continuous Blood Gluc Receiver (FREESTYLE LIBRE 2 READER) DEVI USE TO CHECK GLUCOSE AS DIRECTED     Continuous Blood Gluc Sensor (FREESTYLE LIBRE 2  SENSOR) MISC USE AS DIRECTED TO CHECK BLOOD SUGAR AND CHANGE EVERY 14 DAYS     cyclobenzaprine (FLEXERIL) 5 MG tablet Take 1 tablet (5 mg total) by mouth 3 (three) times daily as needed for muscle spasms. 15 tablet 0   escitalopram (LEXAPRO) 20 MG tablet Take 40 mg by mouth daily.     gabapentin (NEURONTIN) 300 MG capsule Take 2 capsules (600 mg total) by mouth 2 (two) times daily. 120 capsule 0   insulin aspart (NOVOLOG FLEXPEN) 100 UNIT/ML FlexPen CBG 70 - 120: 0 units CBG 121 - 150: 1 unit CBG 151 - 200: 2 units CBG 201 - 250: 3 units CBG 251 - 300: 5 units CBG 301 - 350: 7 units CBG 351 - 400: 9 units CBG > 400: call MD 15 mL 11   losartan (COZAAR) 25 MG tablet Take 1 tablet (25 mg total) by mouth daily. 90 tablet 0   metFORMIN (GLUCOPHAGE) 1000 MG tablet Take 1,000 mg by mouth 2 (two) times daily with a meal.     metoprolol tartrate (LOPRESSOR) 50 MG tablet Take 1 tablet (50 mg total) by mouth 2 (two) times daily. 60 tablet 1   naproxen sodium (ALEVE) 220 MG tablet Take 220 mg by mouth.     No current facility-administered medications for this visit.    OBJECTIVE: There were no vitals filed for this visit.   There is no height or weight on file to calculate BMI.      General: Well-developed, well-nourished, no acute distress. Eyes: Pink conjunctiva, anicteric sclera. HEENT: Normocephalic, moist mucous membranes, clear oropharnyx. Lungs: Clear to auscultation bilaterally. Heart: Regular rate and rhythm. No rubs, murmurs, or gallops. Abdomen: Soft, nontender, nondistended. No organomegaly noted, normoactive bowel sounds. Musculoskeletal: No edema, cyanosis, or clubbing. Neuro: Alert, answering all questions appropriately. Cranial nerves grossly intact. Skin: No rashes or petechiae noted. Psych: Normal affect. Lymphatics: No cervical, calvicular, axillary or inguinal LAD.   LAB RESULTS:  Lab Results  Component Value Date   NA 129 (L) 04/01/2023   K 5.0 04/01/2023   CL 92 (L)  04/01/2023   CO2 20 04/01/2023   GLUCOSE 389 (H) 04/01/2023   BUN 22 (H) 04/01/2023   CREATININE 1.45 (H) 04/01/2023   CALCIUM 9.3 04/01/2023   PROT 7.7 04/01/2023   ALBUMIN 3.6 (L) 04/01/2023   AST 9 04/01/2023   ALT 12 04/01/2023   ALKPHOS 182 (H) 04/01/2023   BILITOT 0.3 04/01/2023   GFRNONAA 48 (L) 10/24/2022   GFRAA >60 05/22/2019    Lab Results  Component Value Date   WBC 12.0 (H) 04/01/2023   NEUTROABS 8.7 (H) 04/01/2023   HGB 8.6 (L) 04/01/2023  HCT 28.5 (L) 04/01/2023   MCV 78 (L) 04/01/2023   PLT 487 (H) 04/01/2023    Lab Results  Component Value Date   TIBC 291 04/01/2023   TIBC 378 09/07/2022   TIBC 282 06/14/2021   FERRITIN 176 (H) 04/01/2023   FERRITIN 163 09/08/2022   FERRITIN 203 06/14/2021   IRONPCTSAT 14 (L) 04/01/2023   IRONPCTSAT 12 09/07/2022   IRONPCTSAT 17 06/14/2021     STUDIES: No results found.  ASSESSMENT AND PLAN:   Theresa Malone is a 42 y.o. female with pmh of iron deficiency anemia, diabetes, hypertension, gout, anxiety, CKD stage III seen as a transfer of care from [facility] for management of anemia.  # Microcytic anemia -Progressive.  Anemia present since 2021.  Labs from 04/01/2023 showed hemoglobin 8.7, MCV 78.  Iron saturation 14% and ferritin 176.  Her anemia is likely multifactorial from iron deficiency, chronic kidney disease and chronic inflammation.  Both ESR and CRP are elevated.  Her ferritin of 176 is not a true reflection of iron deficiency and may be high as an acute phase reactant.  Iron deficiency is likely secondary to heavy menstrual period.  SPEP/IFE no monoclonal protein.  -She last received IV Venofer in 2022 with Dr. Ellin Saba at [facility].  Had multiple no-shows and then was lost to follow-up.  I discussed about resuming IV Venofer 300 mg weekly x 3 doses.  She tolerated prior infusions well so I am not anticipating any reactions.  I will follow-up with her repeat labs in 4 months to assess  response.  # Leukocytosis -Chronic.  Likely reactive in nature from elevated inflammatory markers.  Monitor for now.  # Thrombocytosis -Likely reactive from iron deficiency and inflammation.  Monitor  # Elevated inflammatory marker # Diffuse joint pains # Bilateral swelling of fingers -Reports present since November 2023. Workup was initiated by Alfredia Ferguson her primary.  ANA negative.  Rheumatoid factor mildly elevated.  Anti-CCP negative.  ESR 81.  CRP 99.   -Referral to rheumatology was placed and scheduling is pending. -She was previously using ibuprofen 800 mg 4-6 times a day.  Has stopped taking it after she was started on gabapentin and Flexeril.  I discussed with Theresa Malone and referral to pain management was also made.  IV Venofer 300 mg weekly x 3 doses RTC in 4 months for MD visit, labs, possible Venofer.  Patient expressed understanding and was in agreement with this plan. She also understands that She can call clinic at any time with any questions, concerns, or complaints.   I spent a total of 45 minutes reviewing chart data, face-to-face evaluation with the patient, counseling and coordination of care as detailed above.  Michaelyn Barter, MD   04/04/2023 10:38 AM

## 2023-04-04 NOTE — Telephone Encounter (Signed)
Home Health Verbal Orders - Caller/Agency: patient En[facility] Number: [phone]  Requesting Skilled Nursing-home health aide/needs help with baths and grooming, can't lift arms well Frequency: ?

## 2023-04-04 NOTE — Telephone Encounter (Signed)
Spoke to pt--requesting home health care for aide. Notified pt needed to call insurance [facility]) for more info regarding this request.

## 2023-04-04 NOTE — Progress Notes (Signed)
Pt. Is having severe pain in her joints and in her bones rates her pain at a 10. Is also having severe pain on the front side of her body.

## 2023-04-04 NOTE — Telephone Encounter (Signed)
Do you which Home Health Care Agency.Marland Kitchen

## 2023-04-04 NOTE — Progress Notes (Signed)
  Care Coordination   Note   04/04/2023 Name: Theresa Malone MRN: [id number] DOB: 1981-04-12  Theresa Malone is a 42 y.o. year old female who sees [place], Lillia Abed, [place] for primary care. I reached out to [employer] by phone today in response to a referral   Follow up plan:  Telephone appointment with care coordination team member scheduled for:  04/11/2023  Encounter Outcome:  Pt. Scheduled  Burman Nieves, CCMA Care Coordination Care Guide Direct Dial: [phone]

## 2023-04-05 ENCOUNTER — Ambulatory Visit: Payer: Self-pay | Admitting: *Deleted

## 2023-04-05 NOTE — Telephone Encounter (Signed)
  Chief Complaint: Back and left shoulder pain because she has been off her muscle relaxer.   It has been filled now so she needs to go pick it up.   Declined an appt. Symptoms: Pain from top of her back all the way down and left shoulder pain associated with back pain per pt. Frequency: Since being off the muscle relaxer for a couple of days. Pertinent Negatives: Patient denies wanting an appt. Disposition: [] ED /[] Urgent Care (no appt availability in office) / [] Appointment(In office/virtual)/ []  [facility]/ [] Home Care/ [] Refused Recommended Disposition /[] [place] [facility]/ [x]  Follow-up with PCP Additional Notes: Pt wanting to know about seeing a pain medicine dr as discussed during last office visit.    Pt agreeable to someone calling her back with this information.
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
## 2023-04-05 NOTE — Telephone Encounter (Signed)
Patient advised and would like number sent to her via mychart

## 2023-04-09 ENCOUNTER — Ambulatory Visit (INDEPENDENT_AMBULATORY_CARE_PROVIDER_SITE_OTHER): Payer: Medicaid Other | Admitting: Physician Assistant

## 2023-04-09 VITALS — BP 168/99 | HR 89 | Wt 239.1 lb

## 2023-04-09 DIAGNOSIS — F332 Major depressive disorder, recurrent severe without psychotic features: Secondary | ICD-10-CM

## 2023-04-09 DIAGNOSIS — E1159 Type 2 diabetes mellitus with other circulatory complications: Secondary | ICD-10-CM

## 2023-04-09 DIAGNOSIS — M2550 Pain in unspecified joint: Secondary | ICD-10-CM | POA: Diagnosis not present

## 2023-04-09 DIAGNOSIS — G8929 Other chronic pain: Secondary | ICD-10-CM

## 2023-04-09 DIAGNOSIS — D509 Iron deficiency anemia, unspecified: Secondary | ICD-10-CM

## 2023-04-09 DIAGNOSIS — N1831 Chronic kidney disease, stage 3a: Secondary | ICD-10-CM

## 2023-04-09 DIAGNOSIS — E1122 Type 2 diabetes mellitus with diabetic chronic kidney disease: Secondary | ICD-10-CM

## 2023-04-09 DIAGNOSIS — Z794 Long term (current) use of insulin: Secondary | ICD-10-CM

## 2023-04-09 DIAGNOSIS — I152 Hypertension secondary to endocrine disorders: Secondary | ICD-10-CM

## 2023-04-09 MED ORDER — ACETAMINOPHEN ER 650 MG PO TBCR
650.0000 mg | EXTENDED_RELEASE_TABLET | Freq: Three times a day (TID) | ORAL | 0 refills | Status: DC | PRN
Start: 2023-04-09 — End: 2024-04-24

## 2023-04-09 MED ORDER — LOSARTAN POTASSIUM-HCTZ 50-12.5 MG PO TABS
1.0000 | ORAL_TABLET | Freq: Every day | ORAL | 1 refills | Status: DC
Start: 2023-04-09 — End: 2023-09-25

## 2023-04-09 MED ORDER — TIRZEPATIDE 2.5 MG/0.5ML ~~LOC~~ SOAJ
2.5000 mg | SUBCUTANEOUS | 0 refills | Status: DC
Start: 2023-04-09 — End: 2023-05-08

## 2023-04-09 NOTE — Progress Notes (Unsigned)
I,Sha'taria Tyson,acting as a Neurosurgeon for [employer], PA-C.,have documented all relevant documentation on the behalf of Alfredia Ferguson, PA-C,as directed by  Alfredia Ferguson, PA-C while in the presence of Alfredia Ferguson, PA-C.   Established patient visit   Patient: Theresa Malone   DOB: [birth year]   42 y.o. Female  MRN: [id number] Visit Date: 04/09/2023  Today's healthcare provider: Alfredia Ferguson, PA-C   No chief complaint on file.  Subjective    HPI  Hypertension, follow-up  BP Readings from Last 3 Encounters:  04/04/23 (!) 158/99  04/01/23 (!) 155/99  03/28/23 (!) 161/95   Wt Readings from Last 3 Encounters:  04/04/23 236 lb (107 kg)  04/01/23 236 lb (107 kg)  03/28/23 234 lb (106.1 kg)     She was last seen for hypertension 8 days ago.  BP at that visit was 155/99. Management since that visit includes advised strongly to start additional losartan 25 mg and continue metoprolol 50 mg.  She reports excellent compliance with treatment. She is not having side effects. {:1} Outside blood pressures are {not being checked}. Symptoms:dizziness No chest pain No chest pressure  No palpitations No syncope  No dyspnea No orthopnea  No paroxysmal nocturnal dyspnea Yes lower extremity edema   Pertinent labs Lab Results  Component Value Date   CHOL 214 (H) 02/15/2023   HDL 62 02/15/2023   LDLCALC 113 (H) 02/15/2023   TRIG 229 (H) 02/15/2023   CHOLHDL 3.5 02/15/2023   Lab Results  Component Value Date   NA 129 (L) 04/01/2023   K 5.0 04/01/2023   CREATININE 1.45 (H) 04/01/2023   EGFR 47 (L) 04/01/2023   GLUCOSE 389 (H) 04/01/2023   TSH 1.250 02/15/2023     The ASCVD Risk score (Arnett DK, et al., 2019) failed to calculate for the following reasons:   The 2019 ASCVD risk score is only valid for ages 57 to 57 ---------------------------------------------------------------------------------------------------   Medications: Outpatient Medications Prior to Visit   Medication Sig   acetaminophen (TYLENOL) 650 MG CR tablet Take 650 mg by mouth every 8 (eight) hours as needed for pain.   BD PEN NEEDLE MICRO U/F 32G X 6 MM MISC Inject into the skin daily.   Continuous Blood Gluc Receiver (FREESTYLE LIBRE 2 READER) DEVI USE TO CHECK GLUCOSE AS DIRECTED   Continuous Blood Gluc Sensor (FREESTYLE LIBRE 2 SENSOR) MISC USE AS DIRECTED TO CHECK BLOOD SUGAR AND CHANGE EVERY 14 DAYS   cyclobenzaprine (FLEXERIL) 5 MG tablet Take 1 tablet by mouth three times daily as needed for muscle spasm   escitalopram (LEXAPRO) 20 MG tablet Take 40 mg by mouth daily.   gabapentin (NEURONTIN) 300 MG capsule Take 2 capsules (600 mg total) by mouth 2 (two) times daily.   insulin aspart (NOVOLOG FLEXPEN) 100 UNIT/ML FlexPen CBG 70 - 120: 0 units CBG 121 - 150: 1 unit CBG 151 - 200: 2 units CBG 201 - 250: 3 units CBG 251 - 300: 5 units CBG 301 - 350: 7 units CBG 351 - 400: 9 units CBG > 400: call MD   losartan (COZAAR) 25 MG tablet Take 1 tablet (25 mg total) by mouth daily.   metFORMIN (GLUCOPHAGE) 1000 MG tablet Take 1,000 mg by mouth 2 (two) times daily with a meal.   metoprolol tartrate (LOPRESSOR) 50 MG tablet Take 1 tablet (50 mg total) by mouth 2 (two) times daily.   naproxen sodium (ALEVE) 220 MG tablet Take 220 mg by mouth.  No facility-administered medications prior to visit.    Review of Systems  {Labs  Heme  Chem  Endocrine  Serology  Results Review (optional):23779}   Objective    There were no vitals taken for this visit. {Show previous vital signs (optional):23777}  Physical Exam  ***  No results found for any visits on 04/09/23.  Assessment & Plan     ***  No follow-ups on file.      {provider attestation***:1}   Alfredia Ferguson, PA-C  [facility] [phone] (phone) [phone] (fax)  [facility]

## 2023-04-10 ENCOUNTER — Encounter: Payer: Self-pay | Admitting: Internal Medicine

## 2023-04-10 ENCOUNTER — Encounter: Payer: Self-pay | Admitting: Physician Assistant

## 2023-04-10 DIAGNOSIS — M2550 Pain in unspecified joint: Secondary | ICD-10-CM | POA: Insufficient documentation

## 2023-04-10 DIAGNOSIS — G8929 Other chronic pain: Secondary | ICD-10-CM | POA: Insufficient documentation

## 2023-04-10 NOTE — Assessment & Plan Note (Signed)
Undx. Some autoimmune markers positive  Rheum referral is accepted. Pt already referred to pain management Currently managing with gabapentin BID, flexeril 2-3 times daily.  Hesitant to start nsaids/tramadol d/t CKD.

## 2023-04-10 NOTE — Assessment & Plan Note (Signed)
Novalog 30 U bedtime and 25 U AM  Metformin 1000 mg BID Adding mounjaro today. Strongly advised to check her BS at home to avoid hypoglycemic events.  Pt has appt with endo pending.

## 2023-04-10 NOTE — Assessment & Plan Note (Signed)
Situational, worsening given declining health status Manages with lexapro 40 mg does not want to change  Discussed additional wellbutrin, pt declines

## 2023-04-10 NOTE — Assessment & Plan Note (Signed)
F/b heme  Now getting iron infusions

## 2023-04-10 NOTE — Assessment & Plan Note (Signed)
Switch losartan 25 to hyzar 50-12.5  Cont metop 50 mg.  F/u 4-6 weeks

## 2023-04-11 ENCOUNTER — Encounter: Payer: Medicaid Other | Admitting: *Deleted

## 2023-04-12 ENCOUNTER — Ambulatory Visit: Payer: Medicaid Other

## 2023-04-12 ENCOUNTER — Inpatient Hospital Stay: Payer: Medicaid Other

## 2023-04-12 VITALS — BP 146/86 | HR 110 | Temp 98.2°F | Resp 18

## 2023-04-12 DIAGNOSIS — D509 Iron deficiency anemia, unspecified: Secondary | ICD-10-CM | POA: Diagnosis not present

## 2023-04-12 MED ORDER — SODIUM CHLORIDE 0.9 % IV SOLN
Freq: Once | INTRAVENOUS | Status: AC
  Filled 2023-04-12: qty 250

## 2023-04-12 MED ORDER — SODIUM CHLORIDE 0.9 % IV SOLN
300.0000 mg | Freq: Once | INTRAVENOUS | Status: AC
  Administered 2023-04-12: 300 mg via INTRAVENOUS
  Filled 2023-04-12: qty 300

## 2023-04-12 NOTE — Progress Notes (Signed)
HR intermittently elevated today to 110's and BP slightly elevated, pt reports she has not taken any of her meds today, instructed on importance of taking them, pt verbalizes she will take them when she gets home.

## 2023-04-12 NOTE — Patient Instructions (Signed)
[facility]  Discharge Instructions: Thank you for choosing [facility] to provide your oncology and hematology care.  If you have a lab appointment with the Cancer Center, please go directly to the Cancer Center and check in at the registration area.  Wear comfortable clothing and clothing appropriate for easy access to any Portacath or PICC line.   We strive to give you quality time with your provider. You may need to reschedule your appointment if you arrive late (15 or more minutes).  Arriving late affects you and other patients whose appointments are after yours.  Also, if you miss three or more appointments without notifying the office, you may be dismissed from the clinic at the provider's discretion.      For prescription refill requests, have your pharmacy contact our office and allow 72 hours for refills to be completed.    Today you received the following chemotherapy and/or immunotherapy agents Venofer.      To help prevent nausea and vomiting after your treatment, we encourage you to take your nausea medication as directed.  BELOW ARE SYMPTOMS THAT SHOULD BE REPORTED IMMEDIATELY: *FEVER GREATER THAN 100.4 F (38 C) OR HIGHER *CHILLS OR SWEATING *NAUSEA AND VOMITING THAT IS NOT CONTROLLED WITH YOUR NAUSEA MEDICATION *UNUSUAL SHORTNESS OF BREATH *UNUSUAL BRUISING OR BLEEDING *URINARY PROBLEMS (pain or burning when urinating, or frequent urination) *BOWEL PROBLEMS (unusual diarrhea, constipation, pain near the anus) TENDERNESS IN MOUTH AND THROAT WITH OR WITHOUT PRESENCE OF ULCERS (sore throat, sores in mouth, or a toothache) UNUSUAL RASH, SWELLING OR PAIN  UNUSUAL VAGINAL DISCHARGE OR ITCHING   Items with * indicate a potential emergency and should be followed up as soon as possible or go to the Emergency Department if any problems should occur.  Please show the CHEMOTHERAPY ALERT CARD or IMMUNOTHERAPY ALERT CARD at check-in to  the Emergency Department and triage nurse.  Should you have questions after your visit or need to cancel or reschedule your appointment, please contact [facility]  [phone] and follow the prompts.  Office hours are 8:00 a.m. to 4:30 p.m. Monday - Friday. Please note that voicemails left after 4:00 p.m. may not be returned until the following business day.  We are closed weekends and major holidays. You have access to a nurse at all times for urgent questions. Please call the main number to the clinic [phone] and follow the prompts.  For any non-urgent questions, you may also contact your provider using MyChart. We now offer e-Visits for anyone 18 and older to request care online for non-urgent symptoms. For details visit [email or web address].   Also download the MyChart app! Go to the app store, search "MyChart", open the app, select [facility], and log in with your MyChart username and password.   

## 2023-04-16 ENCOUNTER — Telehealth: Payer: Self-pay | Admitting: Physician Assistant

## 2023-04-16 NOTE — Telephone Encounter (Signed)
Covermymeds requesting prior authorization Key: [id number] Name: Theresa Malone 2.5 MG/0.5ML pen injectors

## 2023-04-17 ENCOUNTER — Inpatient Hospital Stay: Payer: Medicaid Other

## 2023-04-17 ENCOUNTER — Ambulatory Visit: Payer: Medicaid Other

## 2023-04-17 VITALS — BP 142/89 | HR 96 | Temp 97.8°F | Resp 18

## 2023-04-17 DIAGNOSIS — D509 Iron deficiency anemia, unspecified: Secondary | ICD-10-CM | POA: Diagnosis not present

## 2023-04-17 MED ORDER — SODIUM CHLORIDE 0.9 % IV SOLN
Freq: Once | INTRAVENOUS | Status: AC
  Filled 2023-04-17: qty 250

## 2023-04-17 MED ORDER — SODIUM CHLORIDE 0.9 % IV SOLN
300.0000 mg | Freq: Once | INTRAVENOUS | Status: AC
  Administered 2023-04-17: 300 mg via INTRAVENOUS
  Filled 2023-04-17: qty 300

## 2023-04-17 NOTE — Patient Instructions (Signed)

## 2023-04-17 NOTE — Progress Notes (Signed)
pt here for venoer 300---- pt is 162/102-- manually-- same twice on dynamap. just waking up and did not take BP meds- took tylenol and muscle relaxer. only did right are cause left arm has increasing hand swelling. Per Dr Alena Bills- ok to proceed

## 2023-04-23 ENCOUNTER — Ambulatory Visit: Payer: Medicaid Other | Admitting: "Endocrinology

## 2023-04-23 MED FILL — Iron Sucrose Inj 20 MG/ML (Fe Equiv): INTRAVENOUS | Qty: 15 | Status: AC

## 2023-04-24 ENCOUNTER — Inpatient Hospital Stay: Payer: Medicaid Other

## 2023-04-24 ENCOUNTER — Ambulatory Visit: Payer: Medicaid Other

## 2023-04-24 VITALS — BP 150/87 | HR 92 | Temp 98.2°F | Resp 18

## 2023-04-24 DIAGNOSIS — D509 Iron deficiency anemia, unspecified: Secondary | ICD-10-CM | POA: Diagnosis not present

## 2023-04-24 MED ORDER — SODIUM CHLORIDE 0.9 % IV SOLN
Freq: Once | INTRAVENOUS | Status: AC
  Filled 2023-04-24: qty 250

## 2023-04-24 MED ORDER — SODIUM CHLORIDE 0.9 % IV SOLN
300.0000 mg | Freq: Once | INTRAVENOUS | Status: AC
  Administered 2023-04-24: 300 mg via INTRAVENOUS
  Filled 2023-04-24: qty 300

## 2023-04-24 NOTE — Telephone Encounter (Signed)
PA completed via [employer]  Ref # [id number] PA # [id number]

## 2023-04-25 ENCOUNTER — Encounter: Payer: Self-pay | Admitting: Physician Assistant

## 2023-04-25 ENCOUNTER — Ambulatory Visit: Payer: Self-pay

## 2023-04-25 ENCOUNTER — Telehealth (INDEPENDENT_AMBULATORY_CARE_PROVIDER_SITE_OTHER): Payer: Medicaid Other | Admitting: Physician Assistant

## 2023-04-25 VITALS — BP 156/91 | Wt 232.0 lb

## 2023-04-25 DIAGNOSIS — R768 Other specified abnormal immunological findings in serum: Secondary | ICD-10-CM

## 2023-04-25 DIAGNOSIS — M7989 Other specified soft tissue disorders: Secondary | ICD-10-CM | POA: Diagnosis not present

## 2023-04-25 DIAGNOSIS — R7982 Elevated C-reactive protein (CRP): Secondary | ICD-10-CM | POA: Diagnosis not present

## 2023-04-25 DIAGNOSIS — R29898 Other symptoms and signs involving the musculoskeletal system: Secondary | ICD-10-CM | POA: Diagnosis not present

## 2023-04-25 NOTE — Telephone Encounter (Signed)
     Chief Complaint: Both hands swollen, some finger joints swollen Symptoms: Above Frequency: 1 day ago Pertinent Negatives: Patient denies any pain Disposition: [] ED /[] Urgent Care (no appt availability in office) / [x] Appointment(In office/virtual)/ []  [facility]/ [] Home Care/ [] Refused Recommended Disposition /[] [place] [facility]/ []  Follow-up with PCP Additional Notes:   Reason for Disposition  MODERATE hand swelling (e.g., visible swelling of hand and fingers; pitting edema)  Answer Assessment - Initial Assessment Questions 1. ONSET: "When did the swelling start?" (e.g., minutes, hours, days)     1 day ago 2. LOCATION: "What part of the hand is swollen?"  "Are both hands swollen or just one hand?"     Both hands 3. SEVERITY: "How bad is the swelling?" (e.g., localized; mild, moderate, severe)   - BALL OR LUMP: small ball or lump   - LOCALIZED: puffy or swollen area or patch of skin   - JOINT SWELLING: swelling of a joint   - MILD: puffiness or mild swelling of fingers or hand   - MODERATE: fingers and hand are swollen   - SEVERE: swelling of entire hand and up into forearm     Moderate  4. REDNESS: "Does the swelling look red or infected?"     No 5. PAIN: "Is the swelling painful to touch?" If Yes, ask: "How painful is it?"   (Scale 1-10; mild, moderate or severe)     None 6. FEVER: "Do you have a fever?" If Yes, ask: "What is it, how was it measured, and when did it start?"      No 7. CAUSE: "What do you think is causing the hand swelling?" (e.g., heat, insect bite, pregnancy, recent injury)     Unsure 8. MEDICAL HISTORY: "Do you have a history of heart failure, kidney disease, liver failure, or cancer?"     Yes 9. RECURRENT SYMPTOM: "Have you had hand swelling before?" If Yes, ask: "When was the last time?" "What happened that time?"     No 10. OTHER SYMPTOMS: "Do you have any other symptoms?" (e.g., blurred vision, difficulty breathing, headache)        No 11. PREGNANCY: "Is there any chance you are pregnant?" "When was your last menstrual period?"       No  Protocols used: Hand Swelling-A-AH

## 2023-04-25 NOTE — Progress Notes (Signed)
I,Sulibeya S Dimas,acting as a Neurosurgeon for [employer], PA-C.,have documented all relevant documentation on the behalf of Alfredia Ferguson, PA-C,as directed by  Alfredia Ferguson, PA-C while in the presence of Alfredia Ferguson, PA-C.  MyChart Video Visit    Virtual Visit via Video Note   This format is felt to be most appropriate for this patient at this time. Physical exam was limited by quality of the video and audio technology used for the visit.   Patient location: home, in bed Provider location: bfp  I discussed the limitations of evaluation and management by telemedicine and the availability of in person appointments. The patient expressed understanding and agreed to proceed.  Patient: Theresa Malone   DOB: 07-18-1981   42 y.o. Female  MRN: [id number] Visit Date: 04/25/2023  Today's healthcare provider: Alfredia Ferguson, PA-C   Cc. Hand swelling, discoloration  Subjective    HPI  Pt is concerned over swelling in her hands and dark discoloration to parts of her fingers.  She is concerned she is losing blood flow in her fingers..  Medications: Outpatient Medications Prior to Visit  Medication Sig   acetaminophen (TYLENOL) 650 MG CR tablet Take 1 tablet (650 mg total) by mouth every 8 (eight) hours as needed for pain.   BD PEN NEEDLE MICRO U/F 32G X 6 MM MISC Inject into the skin daily.   Continuous Blood Gluc Receiver (FREESTYLE LIBRE 2 READER) DEVI USE TO CHECK GLUCOSE AS DIRECTED   Continuous Blood Gluc Sensor (FREESTYLE LIBRE 2 SENSOR) MISC USE AS DIRECTED TO CHECK BLOOD SUGAR AND CHANGE EVERY 14 DAYS   cyclobenzaprine (FLEXERIL) 5 MG tablet Take 1 tablet by mouth three times daily as needed for muscle spasm   escitalopram (LEXAPRO) 20 MG tablet Take 40 mg by mouth daily.   gabapentin (NEURONTIN) 300 MG capsule Take 2 capsules (600 mg total) by mouth 2 (two) times daily.   insulin aspart (NOVOLOG FLEXPEN) 100 UNIT/ML FlexPen CBG 70 - 120: 0 units CBG 121 - 150: 1 unit CBG 151  - 200: 2 units CBG 201 - 250: 3 units CBG 251 - 300: 5 units CBG 301 - 350: 7 units CBG 351 - 400: 9 units CBG > 400: call MD   losartan-hydrochlorothiazide (HYZAAR) 50-12.5 MG tablet Take 1 tablet by mouth daily.   metFORMIN (GLUCOPHAGE) 1000 MG tablet Take 1,000 mg by mouth 2 (two) times daily with a meal.   metoprolol tartrate (LOPRESSOR) 50 MG tablet Take 1 tablet (50 mg total) by mouth 2 (two) times daily.   naproxen sodium (ALEVE) 220 MG tablet Take 220 mg by mouth.   tirzepatide (MOUNJARO) 2.5 MG/0.5ML Pen Inject 2.5 mg into the skin once a week.   No facility-administered medications prior to visit.    Review of Systems  Constitutional:  Negative for chills and fever.  Respiratory:  Negative for chest tightness and shortness of breath.   Cardiovascular:  Negative for chest pain.     Objective    BP (!) 156/91 Comment: pt reported  Wt 232 lb (105.2 kg)   BMI 43.84 kg/m    Physical Exam   Not really able to eval hands on video  Assessment & Plan     1. Hand swelling Reassured it is not discolored from lack of blood flow. Taught pt how to check capillary refill - Ambulatory referral to Rheumatology  2. Elevated C-reactive protein (CRP) 3. Elevated rheumatoid factor 4. Upper extremity weakness Rheum appt not until 10/24. Will try  another office - Ambulatory referral to Rheumatology   Return if symptoms worsen or fail to improve.     I discussed the assessment and treatment plan with the patient. The patient was provided an opportunity to ask questions and all were answered. The patient agreed with the plan and demonstrated an understanding of the instructions.   The patient was advised to call back or seek an in-person evaluation if the symptoms worsen or if the condition fails to improve as anticipated.  I provided 8 minutes of non-face-to-face time during this encounter.  I, Alfredia Ferguson, PA-C have reviewed all documentation for this visit. The documentation  on  04/25/23   for the exam, diagnosis, procedures, and orders are all accurate and complete.  Alfredia Ferguson, PA-C [facility] [address] [place], [place], [address] Office: [phone] Fax: [phone]   [facility]

## 2023-04-26 ENCOUNTER — Ambulatory Visit: Payer: Self-pay | Admitting: *Deleted

## 2023-04-26 NOTE — Patient Outreach (Signed)
  Care Coordination   Initial Visit Note   04/26/2023 Name: Theresa Malone MRN: [id number] DOB: 16-Aug-1981  Theresa Malone is a 42 y.o. year old female who sees Alfredia Ferguson, [place] for primary care. I spoke with  Theresa Malone by phone today.  What matters to the patients health and wellness today?  Determine cause of weakness, and manage HTN/DM    Goals Addressed             This Visit's Progress    COMPLETED: Management of chronic medical conditions       Interventions Today    Flowsheet Row Most Recent Value  Chronic Disease   Chronic disease during today's visit Diabetes, Hypertension (HTN), Other  [Weakness in legs and arms. State she has been needing help with bathing and dressing, at times can't get out of bed.  Askign for assistance with PCS]  General Interventions   General Interventions Discussed/Reviewed General Interventions Reviewed, Annual Eye Exam, Labs, Doctor Visits, Communication with, Durable Medical Equipment (DME)  Labs Hgb A1c every 3 months  [Current A1C 8.9]  Doctor Visits Discussed/Reviewed Doctor Visits Discussed, Doctor Visits Reviewed, PCP, Specialist  [facility] 6/14. Rheumatology in October, will call to see if they have sooner appointment]  Durable Medical Equipment (DME) BP Cuff, Glucomoter  [monitoring BP and glucose, both elevated.  BP 156/90 and glucose range 200-300s]  PCP/Specialist Visits Compliance with follow-up visit  [facility] endocrine appt on 5/28 due to no gas, advised of medicaid transportation, provided with number]  Communication with --  [Referral placed to managed medicaid team]  Education Interventions   Education Provided Provided Education, Provided Web-based Education  Provided Verbal Education On Nutrition, Foot Care, Eye Care, Blood Sugar Monitoring, Medication, When to see the doctor, [employer], Labs, [facility], Mental Health/Coping with Illness  [Discussed proper low salt/low sugar diet for HTN and DM.  Taking  Insulin, aware of need for routine eye/foot exams.  Mounjaro ordered, advised to call pharmacy to see if ready.]  Labs Reviewed Hgb A1c  Mental Health Interventions   Mental Health Discussed/Reviewed Mental Health Discussed, Depression, Refer to Social Work for resources  Refer to Social Work for resources regarding Social worker and counseling.]  Nutrition Interventions   Nutrition Discussed/Reviewed Nutrition Reviewed, Carbohydrate meal planning, Adding fruits and vegetables, Portion sizes, Decreasing sugar intake, Decreasing salt              SDOH assessments and interventions completed:  Yes     Care Coordination Interventions:  Yes, provided   Follow up plan: Referral made to managed medicaid team    Encounter Outcome:  Pt. Visit Completed   Kemper Durie, RN, MSN, [facility] Care Management Care Management Coordinator [phone]

## 2023-04-29 ENCOUNTER — Telehealth: Payer: Self-pay

## 2023-04-29 NOTE — Telephone Encounter (Signed)
..   Medicaid Managed Care   Unsuccessful Outreach Note  04/29/2023 Name: Theresa Malone MRN: [id number] DOB: [birth year]  Referred by: Alfredia Ferguson, PA-C Reason for referral : Appointment (I attempted to reach [facility] today to get her scheduled with the MM Team. She did not answer and her voicemail was full. )   An unsuccessful telephone outreach was attempted today. The patient was referred to the case management team for assistance with care management and care coordination.   Follow Up Plan: The care management team will reach out to the patient again over the next 7 days.   Weston Settle Care Guide  [facility] Managed  Care Guide [facility]  [phone]

## 2023-04-30 ENCOUNTER — Telehealth: Payer: Self-pay

## 2023-04-30 NOTE — Telephone Encounter (Signed)
..   Medicaid Managed Care   Unsuccessful Outreach Note  04/30/2023 Name: Theresa Malone MRN: [id number] DOB: [birth year]  Referred by: Alfredia Ferguson, PA-C Reason for referral : Appointment   A second unsuccessful telephone outreach was attempted today. The patient was referred to the case management team for assistance with care management and care coordination.   Follow Up Plan: The care management team will reach out to the patient again over the next 7 days.   Weston Settle Care Guide  [facility] Managed  Care Guide [facility]  [phone]

## 2023-05-02 ENCOUNTER — Other Ambulatory Visit: Payer: Self-pay | Admitting: Physician Assistant

## 2023-05-02 ENCOUNTER — Encounter: Payer: Self-pay | Admitting: Physician Assistant

## 2023-05-02 ENCOUNTER — Telehealth: Payer: Self-pay

## 2023-05-02 ENCOUNTER — Ambulatory Visit: Payer: Self-pay | Admitting: *Deleted

## 2023-05-02 ENCOUNTER — Ambulatory Visit (INDEPENDENT_AMBULATORY_CARE_PROVIDER_SITE_OTHER): Payer: Medicaid Other | Admitting: Physician Assistant

## 2023-05-02 VITALS — BP 139/117 | HR 100

## 2023-05-02 DIAGNOSIS — Z794 Long term (current) use of insulin: Secondary | ICD-10-CM

## 2023-05-02 DIAGNOSIS — F32A Depression, unspecified: Secondary | ICD-10-CM

## 2023-05-02 DIAGNOSIS — I152 Hypertension secondary to endocrine disorders: Secondary | ICD-10-CM

## 2023-05-02 DIAGNOSIS — M2550 Pain in unspecified joint: Secondary | ICD-10-CM | POA: Diagnosis not present

## 2023-05-02 DIAGNOSIS — G8929 Other chronic pain: Secondary | ICD-10-CM

## 2023-05-02 DIAGNOSIS — E1159 Type 2 diabetes mellitus with other circulatory complications: Secondary | ICD-10-CM

## 2023-05-02 DIAGNOSIS — E1122 Type 2 diabetes mellitus with diabetic chronic kidney disease: Secondary | ICD-10-CM | POA: Diagnosis not present

## 2023-05-02 MED ORDER — DEXCOM G7 RECEIVER DEVI
0 refills | Status: DC
Start: 2023-05-02 — End: 2023-05-03

## 2023-05-02 MED ORDER — PREDNISONE 10 MG PO TABS
ORAL_TABLET | ORAL | 0 refills | Status: AC
Start: 2023-05-02 — End: 2023-05-10

## 2023-05-02 MED ORDER — DEXCOM G7 SENSOR MISC
3 refills | Status: DC
Start: 2023-05-02 — End: 2024-02-14

## 2023-05-02 NOTE — Patient Instructions (Signed)
[facility] Rheum  Rheumatology Clinic Phone: [phone]

## 2023-05-02 NOTE — Progress Notes (Signed)
Established patient visit   Patient: Theresa Malone   DOB: 11/20/1981   42 y.o. Female  MRN: [id number] Visit Date: 05/02/2023  Today's healthcare provider: Alfredia Ferguson, PA-C   Cc. Chronic pain flare  Subjective    HPI   Pt reports continued episodes of pain in her lower extremities 2/2 activity. She reports she felt better so she tried to work but after was in such severe pain she could not walk.  She reports never getting the mounjaro. She reports actually only do 30 U of insulin at night -- and recently cut to 15-20 U because she tried the freestyle libre, and one night her sugar dropped to the 50s.   She reports she cannot get the libre to stay on and stopped using it. She does not know where her glucometer is.   She is stressed and frustrated as it seems it is taking a while to get her health figured out and she is struggling.   Medications: Outpatient Medications Prior to Visit  Medication Sig   acetaminophen (TYLENOL) 650 MG CR tablet Take 1 tablet (650 mg total) by mouth every 8 (eight) hours as needed for pain.   BD PEN NEEDLE MICRO U/F 32G X 6 MM MISC Inject into the skin daily.   cyclobenzaprine (FLEXERIL) 5 MG tablet Take 1 tablet by mouth three times daily as needed for muscle spasm   escitalopram (LEXAPRO) 20 MG tablet Take 40 mg by mouth daily.   insulin aspart (NOVOLOG FLEXPEN) 100 UNIT/ML FlexPen CBG 70 - 120: 0 units CBG 121 - 150: 1 unit CBG 151 - 200: 2 units CBG 201 - 250: 3 units CBG 251 - 300: 5 units CBG 301 - 350: 7 units CBG 351 - 400: 9 units CBG > 400: call MD   losartan-hydrochlorothiazide (HYZAAR) 50-12.5 MG tablet Take 1 tablet by mouth daily.   metFORMIN (GLUCOPHAGE) 1000 MG tablet Take 1,000 mg by mouth 2 (two) times daily with a meal.   metoprolol tartrate (LOPRESSOR) 50 MG tablet Take 1 tablet (50 mg total) by mouth 2 (two) times daily.   naproxen sodium (ALEVE) 220 MG tablet Take 220 mg by mouth.   tirzepatide (MOUNJARO) 2.5 MG/0.5ML  Pen Inject 2.5 mg into the skin once a week.   [DISCONTINUED] Continuous Blood Gluc Receiver (FREESTYLE LIBRE 2 READER) DEVI USE TO CHECK GLUCOSE AS DIRECTED   [DISCONTINUED] Continuous Blood Gluc Sensor (FREESTYLE LIBRE 2 SENSOR) MISC USE AS DIRECTED TO CHECK BLOOD SUGAR AND CHANGE EVERY 14 DAYS   gabapentin (NEURONTIN) 300 MG capsule Take 2 capsules (600 mg total) by mouth 2 (two) times daily.   No facility-administered medications prior to visit.    Review of Systems  Constitutional:  Negative for fatigue and fever.  Respiratory:  Negative for cough and shortness of breath.   Cardiovascular:  Negative for chest pain and leg swelling.  Gastrointestinal:  Negative for abdominal pain.  Musculoskeletal:  Positive for arthralgias, back pain, gait problem and joint swelling.  Neurological:  Negative for dizziness and headaches.      Objective    BP (!) 139/117   Pulse 100   SpO2 99%    Physical Exam Vitals reviewed.  Constitutional:      Appearance: She is not ill-appearing.  HENT:     Head: Normocephalic.  Eyes:     Conjunctiva/sclera: Conjunctivae normal.  Cardiovascular:     Rate and Rhythm: Normal rate.  Pulmonary:     Effort:  Pulmonary effort is normal. No respiratory distress.  Neurological:     General: No focal deficit present.     Mental Status: She is alert and oriented to person, place, and time.  Psychiatric:        Mood and Affect: Mood normal.        Behavior: Behavior normal.     No results found for any visits on 05/02/23.  Assessment & Plan     Problem List Items Addressed This Visit       Cardiovascular and Mediastinum   Hypertension associated with diabetes (HCC)    Elevated today.  Rec f/u in 2 weeks.        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  She does not answer when we try to coordinate with social workers/nursing.   Per pt mounjaro was denied-- I was unaware. We will work to appeal Advised her to closely monitor sugar, if > 300 to call office Advised her to go back to 30 U qhs insulin and to have a snack before bed to avoid lows at night       Relevant Medications   Continuous Glucose Sensor (DEXCOM G7 SENSOR) MISC     Other   Chronic joint pain    Again explained to patient her chronic pain is 2/2 to an unknown autoimmune condition  Explained my hesitance of treating with steroids given her uncontrolled diabetes.  She is willing and understands risks for pain relief  Rx steroid taper Advised her to closely monitor sugar, if > 300 to call office Advised her to go back to 30 U qhs insulin and to have a snack before bed to avoid lows at night       Relevant Medications   predniSONE (DELTASONE) 10 MG tablet    Attempted to set up the dexcom with patients phone but I think there is an incompatibility issue. Rx dexcom sensor w/ receiver to see if this works  better than the freestyle.  Return in about 2 weeks (around 05/16/2023) for hypertension.      I, Alfredia Ferguson, PA-C have reviewed all documentation for this visit. The documentation on  05/03/23   for the exam, diagnosis, procedures, and orders are all accurate and complete. Alfredia Ferguson, PA-C [facility] [address] [place], [place], [address] Office: [phone] Fax: [phone]   [facility]

## 2023-05-02 NOTE — Telephone Encounter (Signed)
Reason for Disposition  [1] MODERATE pain (e.g., interferes with normal activities, limping) AND [2] present > 3 days  Answer Assessment - Initial Assessment Questions 1. ONSET: "When did the pain start?"      I'm having pain in my legs and feet.   I'm assume it's RA.   I'm seeing Alfredia Ferguson about it.   I'm to see a specialist.   I can't get in until Oct. With the specialist.   I'm so sore I called out from work today.   I'm having a hard time at work.   I'm a CMA/med tech.    2. LOCATION: "Where is the pain located?"      Both legs and feet. I'm becoming very impatient.   I'm so frustrated.   Nothing is being done quick enough.   Why can't I move my arms and legs when I wake up.   I've been seeing her since APril and I still don't have answers.   I'm getting irritable.    I want to talk with a therapist because I'm frustrated.   I'm irritable with people and they are irritated with me.  I'm taking the gabapentin and a muscle relaxer Flexeril.   They are not helping at all.     3. PAIN: "How bad is the pain?"    (Scale 1-10; or mild, moderate, severe)   -  MILD (1-3): doesn't interfere with normal activities    -  MODERATE (4-7): interferes with normal activities (e.g., work or school) or awakens from sleep, limping    -  SEVERE (8-10): excruciating pain, unable to do any normal activities, unable to walk     Severe pain in legs and feet. 4. WORK OR EXERCISE: "Has there been any recent work or exercise that involved this part of the body?"      No 5. CAUSE: "What do you think is causing the leg pain?"     I don't know    See above 6. OTHER SYMPTOMS: "Do you have any other symptoms?" (e.g., chest pain, back pain, breathing difficulty, swelling, rash, fever, numbness, weakness)     No 7. PREGNANCY: "Is there any chance you are pregnant?" "When was your last menstrual period?"     Not asked  Protocols used: Leg Pain-A-AH  Chief Complaint: Continued pain in both legs, feet and arms.    Unable to make a fist with either hand. Symptoms: Frustrated that nothing is being done.  Been seen for these issues several times.   Can't get in with specialist until Oct.   Very frustrated.   Gabapentin and muscle relaxers not helping. Pt. Also requesting to speak with a therapist.  This pain is affecting her personally and her mental health and her relationship with others. Frequency: Now Pertinent Negatives: Patient denies injuries or accidents. Disposition: [] ED /[] Urgent Care (no appt availability in office) / [x] Appointment(In office/virtual)/ []  [facility]/ [] Home Care/ [] Refused Recommended Disposition /[] [place] [facility]/ []  Follow-up with PCP Additional Notes: Appt made for today with Alfredia Ferguson, PA-C for 4:00.

## 2023-05-02 NOTE — Telephone Encounter (Signed)
Pt called in and was triaged by me.   Unaware that she had already called in and was in the triage queue waiting to be called back by a nurse.    See my separate triage note.   I will close this out.

## 2023-05-02 NOTE — Telephone Encounter (Signed)
Copied from CRM [id number]. Topic: Referral - Request for Referral >> May 02, 2023  8:23 AM Franchot Heidelberg wrote: Has patient seen PCP for this complaint? No.  *If NO, is insurance requiring patient see PCP for this issue before PCP can refer them? Referral for which specialty: Therapy  Preferred provider/office: Highest recommended  Reason for referral: Feels overwhelmed with current health situation and wants to speak to a specialist

## 2023-05-03 ENCOUNTER — Telehealth: Payer: Self-pay | Admitting: Physician Assistant

## 2023-05-03 ENCOUNTER — Other Ambulatory Visit: Payer: Self-pay

## 2023-05-03 ENCOUNTER — Telehealth: Payer: Self-pay

## 2023-05-03 ENCOUNTER — Encounter: Payer: Self-pay | Admitting: Physician Assistant

## 2023-05-03 DIAGNOSIS — Z794 Long term (current) use of insulin: Secondary | ICD-10-CM

## 2023-05-03 MED ORDER — DEXCOM G7 RECEIVER DEVI
1.0000 | Freq: Once | 0 refills | Status: AC
Start: 2023-05-03 — End: 2023-05-03

## 2023-05-03 NOTE — Telephone Encounter (Signed)
Her mounjaro was denied by medicaid -- can we find this denial letter to see if there is the ability to appeal ?

## 2023-05-03 NOTE — Assessment & Plan Note (Signed)
Again explained to patient her chronic pain is 2/2 to an unknown autoimmune condition  Explained my hesitance of treating with steroids given her uncontrolled diabetes.  She is willing and understands risks for pain relief  Rx steroid taper Advised her to closely monitor sugar, if > 300 to call office Advised her to go back to 30 U qhs insulin and to have a snack before bed to avoid lows at night

## 2023-05-03 NOTE — Telephone Encounter (Signed)
Lurena Joiner from [facility] calling in regards to Rx for Continuous Glucose Receiver (DEXCOM G7 RECEIVER) DEVI. Stated for billing purposes Rx has to say in form, may need to say each. She stated has to have a word beside each, like sensor.   Lurena Joiner is requesting a callback from a member of clinical staff for clarification.  Please advise.

## 2023-05-03 NOTE — Telephone Encounter (Signed)
..   Medicaid Managed Care   Unsuccessful Outreach Note  05/03/2023 Name: Theresa Malone MRN: [id number] DOB: [birth year]  Referred by: Alfredia Ferguson, PA-C Reason for referral : No chief complaint on file.   Third unsuccessful telephone outreach was attempted today. The patient was referred to the case management team for assistance with care management and care coordination. The patient's primary care provider has been notified of our unsuccessful attempts to make or maintain contact with the patient. The care management team is pleased to engage with this patient at any time in the future should he/she be interested in assistance from the care management team.   Follow Up Plan: We have been unable to make contact with the patient for follow up. The care management team is available to follow up with the patient after provider conversation with the patient regarding recommendation for care management engagement and subsequent re-referral to the care management team.   Weston Settle Care Guide  [facility] Managed  Care Guide [facility]  [phone]

## 2023-05-03 NOTE — Assessment & Plan Note (Signed)
Elevated today.  Rec f/u in 2 weeks.

## 2023-05-03 NOTE — Assessment & Plan Note (Addendum)
Stressed the importance of close sugar monitoring while taking insulin Stressed the importance of not dramatically changing her dose without consulting prescribing doc Pt had endo appt scheduled but no showed.  She does not answer when we try to coordinate with social workers/nursing.   Per pt mounjaro was denied-- I was unaware. We will work to appeal Advised her to closely monitor sugar, if > 300 to call office Advised her to go back to 30 U qhs insulin and to have a snack before bed to avoid lows at night

## 2023-05-07 ENCOUNTER — Telehealth: Payer: Self-pay | Admitting: Physician Assistant

## 2023-05-07 NOTE — Telephone Encounter (Addendum)
Received a fax from covermymeds for dexcom G7 receiver device  Key: [id number]  Received a fax from covermymeds for Dexcom G7 sensor  Key: [id number]

## 2023-05-07 NOTE — Telephone Encounter (Signed)
Dexcom PA needs to be started  (Key: [id number]) - [id number]  To initiate an authorization request for this medication, please contact [employer] at [phone].

## 2023-05-07 NOTE — Telephone Encounter (Signed)
PA # [id number]  Ref # [id number]  Please call [phone] and get reason for denial/update on PA

## 2023-05-07 NOTE — Telephone Encounter (Signed)
There are two separate Pas, see below and this one for the receiver -- please note the G7 is incompatible with her cell phoen  (Key: [facility]) - [id number]  To initiate an authorization request for this medication, please contact [employer] at [phone].

## 2023-05-08 ENCOUNTER — Other Ambulatory Visit: Payer: Self-pay | Admitting: Physician Assistant

## 2023-05-08 ENCOUNTER — Encounter: Payer: Self-pay | Admitting: Physician Assistant

## 2023-05-08 DIAGNOSIS — E1122 Type 2 diabetes mellitus with diabetic chronic kidney disease: Secondary | ICD-10-CM

## 2023-05-08 MED ORDER — TRULICITY 0.75 MG/0.5ML ~~LOC~~ SOAJ
0.7500 mg | SUBCUTANEOUS | 1 refills | Status: DC
Start: 2023-05-08 — End: 2023-09-25

## 2023-05-08 NOTE — Telephone Encounter (Signed)
PA's started Call reference # [id number]  Sensor PA [id number] Receiver PA [phone]

## 2023-05-09 NOTE — Telephone Encounter (Signed)
Ok for [facility] nurse to inform if she calls, since call will not go through,

## 2023-05-09 NOTE — Progress Notes (Deleted)
I,Amyah Clawson,acting as a Neurosurgeon for [employer], PA-C.,have documented all relevant documentation on the behalf of Alfredia Ferguson, PA-C,as directed by  Alfredia Ferguson, PA-C while in the presence of Alfredia Ferguson, PA-C.    Established patient visit   Patient: Theresa Malone   DOB: [birth year]   42 y.o. Female  MRN: [id number] Visit Date: 05/10/2023  Today's healthcare provider: Alfredia Ferguson, PA-C   No chief complaint on file.  Subjective    HPI  Hypertension, follow-up  BP Readings from Last 3 Encounters:  05/02/23 (!) 139/117  04/25/23 (!) 156/91  04/24/23 (!) 150/87   Wt Readings from Last 3 Encounters:  04/25/23 232 lb (105.2 kg)  04/09/23 239 lb 1.6 oz (108.5 kg)  04/04/23 236 lb (107 kg)     She was last seen for hypertension 4 weeks ago.  BP at that visit was 168/99. Management since that visit includes losartan hyzaar 50-12.5 mg.  She reports {excellent/good/fair/poor:19665} compliance with treatment. She {is/is not:9024} having side effects. {document side effects if present:1} She is following a {diet:21022986} diet. She {is/is not:9024} exercising. She {does/does not:200015} smoke.  Use of agents associated with hypertension: {bp agents assoc with hypertension:511::"none"}.   Outside blood pressures are {***enter patient reported home BP readings, or 'not being checked':1}. Symptoms: {Yes/No:20286} chest pain {Yes/No:20286} chest pressure  {Yes/No:20286} palpitations {Yes/No:20286} syncope  {Yes/No:20286} dyspnea {Yes/No:20286} orthopnea  {Yes/No:20286} paroxysmal nocturnal dyspnea {Yes/No:20286} lower extremity edema   Pertinent labs Lab Results  Component Value Date   CHOL 214 (H) 02/15/2023   HDL 62 02/15/2023   LDLCALC 113 (H) 02/15/2023   TRIG 229 (H) 02/15/2023   CHOLHDL 3.5 02/15/2023   Lab Results  Component Value Date   NA 129 (L) 04/01/2023   K 5.0 04/01/2023   CREATININE 1.45 (H) 04/01/2023   EGFR 47 (L) 04/01/2023    GLUCOSE 389 (H) 04/01/2023   TSH 1.250 02/15/2023     The ASCVD Risk score (Arnett DK, et al., 2019) failed to calculate for the following reasons:   The 2019 ASCVD risk score is only valid for ages 60 to 35  ---------------------------------------------------------------------------------------------------   Medications: Outpatient Medications Prior to Visit  Medication Sig   acetaminophen (TYLENOL) 650 MG CR tablet Take 1 tablet (650 mg total) by mouth every 8 (eight) hours as needed for pain.   BD PEN NEEDLE MICRO U/F 32G X 6 MM MISC Inject into the skin daily.   Continuous Glucose Sensor (DEXCOM G7 SENSOR) MISC Change every 10 days use to continuously monitor blood sugar   cyclobenzaprine (FLEXERIL) 5 MG tablet Take 1 tablet by mouth three times daily as needed for muscle spasm   Dulaglutide (TRULICITY) 0.75 MG/0.5ML SOPN Inject 0.75 mg into the skin once a week.   escitalopram (LEXAPRO) 20 MG tablet Take 40 mg by mouth daily.   gabapentin (NEURONTIN) 300 MG capsule Take 2 capsules (600 mg total) by mouth 2 (two) times daily.   insulin aspart (NOVOLOG FLEXPEN) 100 UNIT/ML FlexPen CBG 70 - 120: 0 units CBG 121 - 150: 1 unit CBG 151 - 200: 2 units CBG 201 - 250: 3 units CBG 251 - 300: 5 units CBG 301 - 350: 7 units CBG 351 - 400: 9 units CBG > 400: call MD   losartan-hydrochlorothiazide (HYZAAR) 50-12.5 MG tablet Take 1 tablet by mouth daily.   metFORMIN (GLUCOPHAGE) 1000 MG tablet Take 1,000 mg by mouth 2 (two) times daily with a meal.   metoprolol tartrate (LOPRESSOR) 50  MG tablet Take 1 tablet (50 mg total) by mouth 2 (two) times daily.   naproxen sodium (ALEVE) 220 MG tablet Take 220 mg by mouth.   predniSONE (DELTASONE) 10 MG tablet Take 6 tablets (60 mg total) by mouth daily with breakfast for 1 day, THEN 5 tablets (50 mg total) daily with breakfast for 1 day, THEN 4 tablets (40 mg total) daily with breakfast for 1 day, THEN 3 tablets (30 mg total) daily with breakfast for 1 day, THEN  2 tablets (20 mg total) daily with breakfast for 1 day, THEN 1 tablet (10 mg total) daily with breakfast for 1 day, THEN 0.5 tablets (5 mg total) daily with breakfast for 2 days.   No facility-administered medications prior to visit.    Review of Systems  {Labs  Heme  Chem  Endocrine  Serology  Results Review (optional):23779}   Objective    There were no vitals taken for this visit. {Show previous Huckleberry Martinson signs (optional):23777}  Physical Exam  ***  No results found for any visits on 05/10/23.  Assessment & Plan     ***  No follow-ups on file.      {provider attestation***:1}   Alfredia Ferguson, PA-C  [facility] [phone] (phone) [phone] (fax)  [facility]

## 2023-05-10 ENCOUNTER — Ambulatory Visit: Payer: Medicaid Other | Admitting: Physician Assistant

## 2023-05-15 ENCOUNTER — Telehealth: Payer: Self-pay

## 2023-05-15 NOTE — Telephone Encounter (Signed)
Copied from CRM [id number]. Topic: Referral - Question >> May 15, 2023 12:55 PM Marlow Baars wrote: Reason for CRM: The patient called in stating she called [facility] and they told her there are no new patient appointments until next year. She is not currently on a wait list but would possible like a new referral elsewhere so she could be seen sooner.  She states one that accepts her insurance preferably and that is not too far. Please assist patient furher

## 2023-05-16 NOTE — Telephone Encounter (Signed)
Called patient, number was not working. Unable to leave VM.

## 2023-05-17 ENCOUNTER — Ambulatory Visit (INDEPENDENT_AMBULATORY_CARE_PROVIDER_SITE_OTHER): Payer: Medicaid Other | Admitting: Physician Assistant

## 2023-05-17 DIAGNOSIS — Z91199 Patient's noncompliance with other medical treatment and regimen due to unspecified reason: Secondary | ICD-10-CM

## 2023-05-17 NOTE — Progress Notes (Signed)
No show

## 2023-06-06 ENCOUNTER — Other Ambulatory Visit: Payer: Self-pay | Admitting: Physician Assistant

## 2023-06-06 DIAGNOSIS — G8929 Other chronic pain: Secondary | ICD-10-CM

## 2023-06-12 ENCOUNTER — Ambulatory Visit: Payer: Self-pay | Admitting: *Deleted

## 2023-06-12 NOTE — Telephone Encounter (Signed)
Message from [place] E sent at 06/12/2023  9:59 AM EDT  Summary: Pain medicine not strong enough, seeking assistance   Pt is requesting to add a narcotic into her pain management regimen, like tramadol in addition to her gabapentin, muscle relaxer, and tylenol because all of those are not strong enough.  Best contact: 1 [phone]          Call History  Contact Date/Time Type Contact Phone/Fax User  06/12/2023 09:58 AM EDT Phone (Incoming) Theresa, Malone (Self) [phone] [name] Randol Kern

## 2023-06-12 NOTE — Telephone Encounter (Signed)
See pt message regarding adding a narcotic to her pain regimen. Her provider Alfredia Ferguson,  PA-C is no longer with [facility] so message forwarded to practice.  Requesting a non delegated medication.

## 2023-06-13 NOTE — Telephone Encounter (Signed)
Tried to call patient her vm was full and couldn't leave a message

## 2023-06-20 ENCOUNTER — Other Ambulatory Visit: Payer: Self-pay | Admitting: Physician Assistant

## 2023-06-20 DIAGNOSIS — E1159 Type 2 diabetes mellitus with other circulatory complications: Secondary | ICD-10-CM

## 2023-06-21 ENCOUNTER — Encounter: Payer: Self-pay | Admitting: Internal Medicine

## 2023-06-21 ENCOUNTER — Ambulatory Visit: Payer: Self-pay | Admitting: *Deleted

## 2023-06-21 NOTE — Telephone Encounter (Signed)
Summary: rx req   The patient would like to speak with a member of clinical staff about possible rx options for their Hidradenitis concerns  The patient would like to be prescribed something to help with their body odor concern  Please contact the patient further when possible      Called # [phone] to review sx of hidradenitis, body odor. No answer and VM box full. Unable to leave message for patient to call back.

## 2023-06-21 NOTE — Telephone Encounter (Signed)
Reason for Disposition  Caller is uncertain what lesion is    Hidradenitis  Answer Assessment - Initial Assessment Questions 1. APPEARANCE of LESION: "What does it look like?"      I have Hidradenitis and I want a cream for the body odor.  I've had that before but I don't remember the name of it. I also want to discuss the [facility] shot. She was not aware that Alfredia Ferguson, PA-C was no longer with the practice.   I let her know where Lillia Abed had gone if she wanted to continue seeing Lillia Abed but pt requested to see [name], PA-C since she has seen [place] before.  2. SIZE: "How big is it?" (e.g., inches, cm; or compare to size of pinhead, tip of pen, eraser, coin, pea, grape, ping pong ball)      I've had this problem for a long time.   The odor bothers me and I want that cream for it. 3. COLOR: "What color is it?" "Is there more than one color?"     N/A 4. SHAPE: "What shape is it?" (e.g., round, irregular)     N/A 5. RAISED: "Does it stick up above the skin or is it flat?" (e.g., raised or elevated)     N/A 6. TENDER: "Does it hurt when you touch it?"  (Scale 1-10; or mild, moderate, severe)      7. LOCATION: "Where is it located?"       8. ONSET: "When did it first appear?"      I've had this a very long time. 9. NUMBER: "Is there just one?" or "Are there others?"      10. CAUSE: "What do you think it is?"       Hidradenitis  11. OTHER SYMPTOMS: "Do you have any other symptoms?" (e.g., fever)       Body odor 12. PREGNANCY: "Is there any chance you are pregnant?" "When was your last menstrual period?"       Not asked  Protocols used: Skin Lesion - Moles or Growths-A-AH  Chief Complaint: Body odor from Hidradenitis.   Requesting a cream for the odor that she has been prescribed before. Symptoms: body odor Frequency: daily Pertinent Negatives: Patient denies N/A Disposition: [] ED /[] Urgent Care (no appt availability in office) / [x] Appointment(In office/virtual)/ []  [facility]/ [] Home Care/ [] Refused Recommended Disposition /[] [facility]/ []  Follow-up with PCP Additional Notes: Appt made with [name], PA-C since Alfredia Ferguson, PA-C is no longer with the practice.   Appt for 06/26/2023 at 3:40.

## 2023-06-25 NOTE — Progress Notes (Signed)
Established patient visit  Patient: Theresa Malone   DOB: [birth year]   42 y.o. Female  MRN: [id number] Visit Date: 06/26/2023  Today's healthcare provider: [name], PA-C   Chief Complaint  Patient presents with   Acute Visit    HS worsening   Subjective     HPI      Comments   Patient here for HS, and would like to discuss weight loss medication       Last edited by [name], PA-C on 06/26/2023  5:04 PM.     Diabetes Mellitus Type II, Follow-up  Lab Results  Component Value Date   HGBA1C 8.9 (H) 02/15/2023   HGBA1C 12.3 (H) 09/07/2022   HGBA1C 9.0 (H) 04/13/2019   Wt Readings from Last 3 Encounters:  06/26/23 236 lb 6.4 oz (107.2 kg)  04/25/23 232 lb (105.2 kg)  04/09/23 239 lb 1.6 oz (108.5 kg)   Last seen for diabetes 2 months ago.  Management since then includes BS "monitoring while taking insulin, not dramatically changing her dose without consulting prescribing doc Pt had endo appt scheduled but no showed."     Pertinent Labs: Lab Results  Component Value Date   CHOL 214 (H) 02/15/2023   HDL 62 02/15/2023   LDLCALC 113 (H) 02/15/2023   TRIG 229 (H) 02/15/2023   CHOLHDL 3.5 02/15/2023   Lab Results  Component Value Date   NA 129 (L) 04/01/2023   K 5.0 04/01/2023   CREATININE 1.45 (H) 04/01/2023   EGFR 47 (L) 04/01/2023   MICRALBCREAT 422 (H) 02/15/2023     ---------------------------------------------------------------------------------------------------        Chronic joint pain was treated with steroid taper on 05/02/23. 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  HTN: reports taking Hyzaar 50-12.5mg  daily and metoprolol 50mg  daily. States that her BP at home around 145 and 85.     05/02/2023    4:13 PM 04/04/2023   10:50 AM 04/01/2023    8:35 AM  Depression screen PHQ 2/9  Decreased Interest 2 1 2   Down, Depressed, Hopeless 2 1 2   PHQ - 2 Score 4 2 4   Altered sleeping 2  2  Tired, decreased energy 2  2  Change in  appetite 2    Feeling bad or failure about yourself  2  1  Trouble concentrating 1  2  Moving slowly or fidgety/restless 0  0  Suicidal thoughts 0  1  PHQ-9 Score 13  12  Difficult doing work/chores Somewhat difficult  Somewhat difficult      07/21/2021   11:36 AM 03/22/2021   10:41 AM  GAD 7 : Generalized Anxiety Score  Nervous, Anxious, on Edge 1 0  Control/stop worrying 1 1  Worry too much - different things 1 1  Trouble relaxing 1 1  Restless 1 0  Easily annoyed or irritable 1 1  Afraid - awful might happen 2 1  Total GAD 7 Score 8 5    Medications: Outpatient Medications Prior to Visit  Medication Sig   acetaminophen (TYLENOL) 650 MG CR tablet Take 1 tablet (650 mg total) by mouth every 8 (eight) hours as needed for pain.   BD PEN NEEDLE MICRO U/F 32G X 6 MM MISC Inject into the skin daily.   Continuous Glucose Sensor (DEXCOM G7 SENSOR) MISC Change every 10 days use to continuously monitor blood sugar   cyclobenzaprine (FLEXERIL) 5 MG tablet Take 1 tablet by mouth three times daily as needed for  muscle spasm   Dulaglutide (TRULICITY) 0.75 MG/0.5ML SOPN Inject 0.75 mg into the skin once a week.   escitalopram (LEXAPRO) 20 MG tablet Take 40 mg by mouth daily.   gabapentin (NEURONTIN) 300 MG capsule Take 2 capsules by mouth twice daily   insulin aspart (NOVOLOG FLEXPEN) 100 UNIT/ML FlexPen CBG 70 - 120: 0 units CBG 121 - 150: 1 unit CBG 151 - 200: 2 units CBG 201 - 250: 3 units CBG 251 - 300: 5 units CBG 301 - 350: 7 units CBG 351 - 400: 9 units CBG > 400: call MD   losartan-hydrochlorothiazide (HYZAAR) 50-12.5 MG tablet Take 1 tablet by mouth daily.   metFORMIN (GLUCOPHAGE) 1000 MG tablet Take 1,000 mg by mouth 2 (two) times daily with a meal.   metoprolol tartrate (LOPRESSOR) 50 MG tablet Take 1 tablet (50 mg total) by mouth 2 (two) times daily.   naproxen sodium (ALEVE) 220 MG tablet Take 220 mg by mouth.   No facility-administered medications prior to visit.    Review of  Systems  All other systems reviewed and are negative.  Except see HPI        Objective    BP (!) 155/102 (BP Location: Left Arm, Patient Position: Sitting, Cuff Size: Large)   Pulse 94   Temp 98.2 F (36.8 C) (Oral)   Ht 5\' 1"  (1.549 m)   Wt 236 lb 6.4 oz (107.2 kg)   SpO2 100%   BMI 44.67 kg/m      Physical Exam Constitutional:      General: She is not in acute distress.    Appearance: Normal appearance.  HENT:     Head: Normocephalic.  Pulmonary:     Effort: Pulmonary effort is normal. No respiratory distress.  Skin:    Findings: Lesion present.          Comments: Sites : axillary, inframammary, buttock Noted diffuse, multiple interconnected tracts and abscesses with scarring  Neurological:     Mental Status: She is alert and oriented to person, place, and time. Mental status is at baseline.      No results found for any visits on 06/26/23.  Assessment & Plan    Pt is transitioning her care from her previous provider 1. Type 2 diabetes mellitus with chronic kidney disease, with long-term current use of insulin, unspecified CKD stage (HCC) Chronic and unstable A1C poct was 10+ Questioning pt's compliance  Emphasized the importance of BS control as was discussed with pt's previous provider  2. Hidradenitis suppurativa Chronic worsening Possibly due to worsening of DMII  - Ambulatory referral to Dermatology - clindamycin-benzoyl peroxide (BENZACLIN) gel; Apply topically 2 (two) times daily.  Dispense: 25 g; Refill: 0 If symptoms worsen, advised antibiotic/doxy  3. 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 Was explained that a referral to ortho or pain clinic could be placed for further evaluation if she needs pain control. However, Her RF was 14.2 with high inflammatory markers on 04/01/23. Encouraged to see rheumatology. Per chart review, she could be scheduled with Dr.  Deveshwar/5.8.24  Hypertension Chronic and unstable Questioning compliance Advised to take Hyzaar, metoprolol as prescribed, monitor BP at home and strongly advised to FU for her chronic conditions.    No follow-ups on file.     The patient was advised to call back or seek an in-person evaluation if the symptoms worsen or if the condition fails to improve as anticipated.  I  discussed the assessment and treatment plan with the patient. The patient was provided an opportunity to ask questions and all were answered. The patient agreed with the plan and demonstrated an understanding of the instructions.  I, [name], PA-C have reviewed all documentation for this visit. The documentation on  06/26/23 for the exam, diagnosis, procedures, and orders are all accurate and complete.  [name], [facility], MMS [facility] [phone] (phone) [phone] (fax)  [facility]

## 2023-06-26 ENCOUNTER — Encounter: Payer: Self-pay | Admitting: Physician Assistant

## 2023-06-26 ENCOUNTER — Ambulatory Visit (INDEPENDENT_AMBULATORY_CARE_PROVIDER_SITE_OTHER): Payer: MEDICAID | Admitting: Physician Assistant

## 2023-06-26 VITALS — BP 155/102 | HR 94 | Temp 98.2°F | Ht 61.0 in | Wt 236.4 lb

## 2023-06-26 DIAGNOSIS — I152 Hypertension secondary to endocrine disorders: Secondary | ICD-10-CM

## 2023-06-26 DIAGNOSIS — E1159 Type 2 diabetes mellitus with other circulatory complications: Secondary | ICD-10-CM | POA: Diagnosis not present

## 2023-06-26 DIAGNOSIS — M2550 Pain in unspecified joint: Secondary | ICD-10-CM

## 2023-06-26 DIAGNOSIS — L732 Hidradenitis suppurativa: Secondary | ICD-10-CM

## 2023-06-26 DIAGNOSIS — G8929 Other chronic pain: Secondary | ICD-10-CM

## 2023-06-26 DIAGNOSIS — E1122 Type 2 diabetes mellitus with diabetic chronic kidney disease: Secondary | ICD-10-CM | POA: Diagnosis not present

## 2023-06-26 DIAGNOSIS — Z794 Long term (current) use of insulin: Secondary | ICD-10-CM

## 2023-06-26 MED ORDER — CLINDAMYCIN PHOS-BENZOYL PEROX 1-5 % EX GEL
Freq: Two times a day (BID) | CUTANEOUS | 0 refills | Status: AC
Start: 2023-06-26 — End: ?

## 2023-06-27 ENCOUNTER — Telehealth: Payer: Self-pay | Admitting: Physician Assistant

## 2023-06-27 LAB — POCT GLYCOSYLATED HEMOGLOBIN (HGB A1C): Hemoglobin A1C: 10.3 % — AB (ref 4.0–5.6)

## 2023-06-27 NOTE — Addendum Note (Signed)
Addended by: Janey Greaser D on: 06/27/2023 07:28 AM   Modules accepted: Orders

## 2023-06-27 NOTE — Telephone Encounter (Signed)
Covermymeds is requesting prior authorization Key: [id number] Name:[facility] Clindamycin Phos- Benzoyl Perox 1-5 % Gel

## 2023-06-27 NOTE — Telephone Encounter (Signed)
Called patient to relay message per [place]. Patient voicemail is full so a message was not left for patient to call office back. Tried calling 2x

## 2023-06-28 ENCOUNTER — Telehealth: Payer: Self-pay | Admitting: Physician Assistant

## 2023-07-07 ENCOUNTER — Encounter: Payer: Self-pay | Admitting: Physician Assistant

## 2023-07-08 NOTE — Telephone Encounter (Signed)
Just an FYI-  Outcome Denied on August 7 by [employer] 2017 Document: Decision Notes: Policy rules found in the [facility] Preferred Drug List (PDL) for ?Topical Acne Agents? guided our decision. Here are the policy requirements your request did not meet: 1. Records show you have tried and failed at least two (2) preferred drugs from this drug class or group of drugs. Preferred drugs for this plan are adapalene-benzoyl peroxide gel/pump, adapalene cream/gel, azelaic acid gel, BP 10-1 wash/cleansing wash, clindamycin phosphate pledgets/solution, clindamycin-benzoyl peroxide gel, erythromycin gel/solution, erythromycin-benzoyl peroxide gel, Finacea gel, Retin-A cream/gel, Retin-A Micro gel . Please note, the drugs listed here may require pre-approval and have limits on the amount covered. OR 2. Records show you have a documented allergy or contraindication to all the preferred drugs in this drug class or group of drugs. This means you have tried these drugs before and had a bad reaction such as a skin rash, itching, swelling, wheezing, or other breathing problems, or you have a medical reason why all the preferred drugs cannot be used. Since the policy requirements have not been met, we are not able to approve this request. If you cannot use other products for your health issue, your doctor can send more information to show why the other drugs cannot be used. Please look at the formulary to see what drugs are covered. Pre-approval may be needed, and quantity limits may apply to covered drugs. Drug Clindamycin Phos-Benzoyl Perox 1-5% gel

## 2023-07-16 ENCOUNTER — Ambulatory Visit: Payer: Self-pay

## 2023-07-16 NOTE — Telephone Encounter (Signed)
Chief Complaint: Anxiety and depression  Symptoms: increased anxiety and depression recently  Frequency: comes and goes  Pertinent Negatives: Patient denies thoughts of self harm or harm to others Disposition: [] ED /[] Urgent Care (no appt availability in office) / [x] Appointment(In office/virtual)/ []  [facility]/ [] Home Care/ [] Refused Recommended Disposition /[] [facility]/ []  Follow-up with PCP Additional Notes: Patient states she recently has been experiencing an increase in anxiety and depression as it relates to her relationship with her mother. Patient states she does not want to change her anti-depressant medication but feels she needs to see her provider to get a referral for therapy or something to help manage her triggers. Care advice has been given and behavioral health resources.  Patient had been scheduled to see PCP tomorrow for evaluation of symptoms.  Reason for Disposition  MODERATE anxiety (e.g., persistent or frequent anxiety symptoms; interferes with sleep, school, or work)  Answer Assessment - Initial Assessment Questions 1. CONCERN: "Did anything happen that prompted you to call today?"      I don't have a relationship with my mother and it is really getting to me now.  2. ANXIETY SYMPTOMS: "Can you describe how you (your loved one; patient) have been feeling?" (e.g., tense, restless, panicky, anxious, keyed up, overwhelmed, sense of impending doom).      Anxious and overwhelmed  3. ONSET: "How long have you been feeling this way?" (e.g., hours, days, weeks)     It started a few weeks ago  4. SEVERITY: "How would you rate the level of anxiety?" (e.g., 0 - 10; or mild, moderate, severe).     Moderate  5. FUNCTIONAL IMPAIRMENT: "How have these feelings affected your ability to do daily activities?" "Have you had more difficulty than usual doing your normal daily activities?" (e.g., getting better, same, worse; self-care, school, work, interactions)      It has been difficult for me to do my daily activities recently  6. HISTORY: "Have you felt this way before?" "Have you ever been diagnosed with an anxiety problem in the past?" (e.g., generalized anxiety disorder, panic attacks, PTSD). If Yes, ask: "How was this problem treated?" (e.g., medicines, counseling, etc.)     PTSD, Depression, anxiety  7. RISK OF HARM - SUICIDAL IDEATION: "Do you ever have thoughts of hurting or killing yourself?" If Yes, ask:  "Do you have these feelings now?" "Do you have a plan on how you would do this?"     No  8. TREATMENT:  "What has been done so far to treat this anxiety?" (e.g., medicines, relaxation strategies). "What has helped?"     I'm on Lexapro, hydrazine  9. TREATMENT - THERAPIST: "Do you have a counselor or therapist? Name?"     No  10. POTENTIAL TRIGGERS: "Do you drink caffeinated beverages (e.g., coffee, colas, teas), and how much daily?" "Do you drink alcohol or use any drugs?" "Have you started any new medicines recently?"       I drink a lot of coke cola, pepsi and tea 11. PATIENT SUPPORT: "Who is with you now?" "Who do you live with?" "Do you have family or friends who you can talk to?"        I'm at work right now, my husband and my 4 children.  12. OTHER SYMPTOMS: "Do you have any other symptoms?" (e.g., feeling depressed, trouble concentrating, trouble sleeping, trouble breathing, palpitations or fast heartbeat, chest pain, sweating, nausea, or diarrhea)       Trouble sleeping, feeling  depressed, I'm very moody right now  Protocols used: Anxiety and Panic Attack-A-AH

## 2023-07-17 ENCOUNTER — Ambulatory Visit: Payer: MEDICAID | Admitting: Physician Assistant

## 2023-07-28 NOTE — Progress Notes (Deleted)
MyChart Video Visit  Virtual Visit via Video Note   This format is felt to be most appropriate for this patient at this time. Physical exam was limited by quality of the video and audio technology used for the visit.   Patient location: office {Patient Location:[id number]::"Home"}  I discussed the limitations of evaluation and management by telemedicine and the availability of in person appointments. The patient expressed understanding and agreed to proceed.  Patient: Theresa Malone   DOB: [birth year]   42 y.o. Female  MRN: [id number] Visit Date: 07/30/2023  Today's healthcare provider: [name], PA-C   No chief complaint on file.  Subjective    HPI  *** Discussed the use of AI scribe software for clinical note transcription with the patient, who gave verbal consent to proceed.  History of Present Illness             Medications: Outpatient Medications Prior to Visit  Medication Sig   acetaminophen (TYLENOL) 650 MG CR tablet Take 1 tablet (650 mg total) by mouth every 8 (eight) hours as needed for pain.   BD PEN NEEDLE MICRO U/F 32G X 6 MM MISC Inject into the skin daily.   clindamycin-benzoyl peroxide (BENZACLIN) gel Apply topically 2 (two) times daily.   Continuous Glucose Sensor (DEXCOM G7 SENSOR) MISC Change every 10 days use to continuously monitor blood sugar   cyclobenzaprine (FLEXERIL) 5 MG tablet Take 1 tablet by mouth three times daily as needed for muscle spasm   Dulaglutide (TRULICITY) 0.75 MG/0.5ML SOPN Inject 0.75 mg into the skin once a week.   escitalopram (LEXAPRO) 20 MG tablet Take 40 mg by mouth daily.   gabapentin (NEURONTIN) 300 MG capsule Take 2 capsules by mouth twice daily   insulin aspart (NOVOLOG FLEXPEN) 100 UNIT/ML FlexPen CBG 70 - 120: 0 units CBG 121 - 150: 1 unit CBG 151 - 200: 2 units CBG 201 - 250: 3 units CBG 251 - 300: 5 units CBG 301 - 350: 7 units CBG 351 - 400: 9 units CBG > 400: call MD   losartan-hydrochlorothiazide (HYZAAR)  50-12.5 MG tablet Take 1 tablet by mouth daily.   metFORMIN (GLUCOPHAGE) 1000 MG tablet Take 1,000 mg by mouth 2 (two) times daily with a meal.   metoprolol tartrate (LOPRESSOR) 50 MG tablet Take 1 tablet (50 mg total) by mouth 2 (two) times daily.   naproxen sodium (ALEVE) 220 MG tablet Take 220 mg by mouth.   No facility-administered medications prior to visit.    Review of Systems  All other systems reviewed and are negative.  Except see HPI   {Insert previous labs (optional):23779} {See past labs  Heme  Chem  Endocrine  Serology  Results Review (optional):1}   Objective    There were no vitals taken for this visit.  {Insert last BP/Wt (optional):23777}{See vitals history (optional):1}    Physical Exam Constitutional:      General: She is not in acute distress.    Appearance: Normal appearance.  HENT:     Head: Normocephalic.  Pulmonary:     Effort: Pulmonary effort is normal. No respiratory distress.  Neurological:     Mental Status: She is alert and oriented to person, place, and time. Mental status is at baseline.        Assessment & Plan             ***  No follow-ups on file.     I discussed the assessment and treatment plan with the patient. The  patient was provided an opportunity to ask questions and all were answered. The patient agreed with the plan and demonstrated an understanding of the instructions.   The patient was advised to call back or seek an in-person evaluation if the symptoms worsen or if the condition fails to improve as anticipated.  I provided *** minutes of non-face-to-face time during this encounter.  I, [name], PA-C have reviewed all documentation for this visit. The documentation on  07/30/23 for the exam, diagnosis, procedures, and orders are all accurate and complete.  [name], [facility], [facility] [phone] (phone) [phone] (fax)  [facility]

## 2023-07-29 ENCOUNTER — Encounter: Payer: Self-pay | Admitting: Internal Medicine

## 2023-07-30 ENCOUNTER — Telehealth: Payer: MEDICAID | Admitting: Physician Assistant

## 2023-07-30 ENCOUNTER — Telehealth: Payer: Self-pay

## 2023-07-30 DIAGNOSIS — E1159 Type 2 diabetes mellitus with other circulatory complications: Secondary | ICD-10-CM

## 2023-07-30 DIAGNOSIS — G8929 Other chronic pain: Secondary | ICD-10-CM

## 2023-07-30 DIAGNOSIS — E1122 Type 2 diabetes mellitus with diabetic chronic kidney disease: Secondary | ICD-10-CM

## 2023-07-30 DIAGNOSIS — L732 Hidradenitis suppurativa: Secondary | ICD-10-CM

## 2023-07-30 NOTE — Telephone Encounter (Signed)
Called patient to get her ready for video visit but the line that was provided, service has been disrupted. I called the mother to make sure I had the correct number, she confirmed, line has been disrupted and shell contact her via work.

## 2023-08-02 ENCOUNTER — Other Ambulatory Visit: Payer: Self-pay | Admitting: Physician Assistant

## 2023-08-02 DIAGNOSIS — G8929 Other chronic pain: Secondary | ICD-10-CM

## 2023-08-03 ENCOUNTER — Encounter: Payer: Self-pay | Admitting: Internal Medicine

## 2023-08-05 ENCOUNTER — Other Ambulatory Visit: Payer: Self-pay

## 2023-08-05 ENCOUNTER — Inpatient Hospital Stay: Payer: MEDICAID | Attending: Nurse Practitioner

## 2023-08-05 ENCOUNTER — Inpatient Hospital Stay: Payer: MEDICAID | Admitting: Nurse Practitioner

## 2023-08-05 ENCOUNTER — Ambulatory Visit: Payer: Medicaid Other

## 2023-08-05 ENCOUNTER — Inpatient Hospital Stay: Payer: MEDICAID

## 2023-08-05 DIAGNOSIS — D509 Iron deficiency anemia, unspecified: Secondary | ICD-10-CM

## 2023-08-28 NOTE — Progress Notes (Deleted)
Office Visit Note  Patient: Theresa Malone             Date of Birth: [birth year]           MRN: [id number]             PCP: [name], PA-C Referring: Alfredia Ferguson, PA-C Visit Date: 09/11/2023 Occupation: @GUAROCC @  Subjective:  No chief complaint on file.   History of Present Illness: Theresa Malone is a 42 y.o. female ***     Activities of Daily Living:  Patient reports morning stiffness for *** {minute/hour:19697}.   Patient {ACTIONS;DENIES/REPORTS:21021675::"Denies"} nocturnal pain.  Difficulty dressing/grooming: {ACTIONS;DENIES/REPORTS:21021675::"Denies"} Difficulty climbing stairs: {ACTIONS;DENIES/REPORTS:21021675::"Denies"} Difficulty getting out of chair: {ACTIONS;DENIES/REPORTS:21021675::"Denies"} Difficulty using hands for taps, buttons, cutlery, and/or writing: {ACTIONS;DENIES/REPORTS:21021675::"Denies"}  No Rheumatology ROS completed.   PMFS History:  Patient Active Problem List   Diagnosis Date Noted   Chronic joint pain 04/10/2023   Iron deficiency anemia 04/04/2023   CKD (chronic kidney disease) stage 3, GFR 30-59 ml/min (HCC) 04/04/2023   Thrombocytosis 04/04/2023   Pain 02/15/2023   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   Amenorrhea 02/15/2023   Gastroparesis 10/23/2022   Anxiety 10/23/2022   Hypertensive urgency 10/21/2022   Type II diabetes mellitus with renal manifestations (HCC) 09/10/2022   Prolonged QT interval 09/10/2022   Leukocytosis 09/10/2022   Depression    Cyst of left ovary 07/21/2021   Microcytic anemia 04/17/2021   Hypertension associated with diabetes (HCC) 02/07/2016   Menorrhagia with regular cycle 02/07/2016   Status post cesarean section 01/25/2015   Hidradenitis suppurativa 07/20/2014    Past Medical History:  Diagnosis Date   Anxiety    panic attacks   Cellulitis    Depression    Diabetes mellitus    Gout    Hidradenitis suppurativa    Hypertension    Obesity    Venous stasis    Venous stasis  dermatitis     Family History  Problem Relation Age of Onset   Diabetes Paternal Grandfather    Cancer Paternal Grandfather        liver   Cancer Paternal Grandmother        liver   Hypertension Father    Diabetes Mother    Hypertension Mother    Diabetes Maternal Uncle    Diabetes Maternal Grandmother    Cancer Maternal Grandfather    Diabetes Daughter        boarderline    Asthma Daughter    Bronchitis Daughter    Bronchitis Son    Bronchitis Daughter    Past Surgical History:  Procedure Laterality Date   CESAREAN SECTION     C/S x 2   CESAREAN SECTION N/A 01/24/2015   Procedure: CESAREAN SECTION;  Surgeon: Catalina Antigua, MD;  Location: [facility];  Service: Obstetrics;  Laterality: N/A;   TUBAL LIGATION     Social History   Social History Narrative   Not on file   Immunization History  Administered Date(s) Administered   Moderna Sars-Covid-2 Vaccination 05/19/2020, 06/16/2020   Pneumococcal Polysaccharide-23 04/13/2019   Tdap 01/26/2015     Objective: Vital Signs: There were no vitals taken for this visit.   Physical Exam   Musculoskeletal Exam: ***  CDAI Exam: CDAI Score: -- Patient Global: --; Provider Global: -- Swollen: --; Tender: -- Joint Exam 09/11/2023   No joint exam has been documented for this visit   There is currently no information documented on the homunculus. Go to the Rheumatology activity and  complete the homunculus joint exam.  Investigation: No additional findings.  Imaging: No results found.  Recent Labs: Lab Results  Component Value Date   WBC 12.0 (H) 04/01/2023   HGB 8.6 (L) 04/01/2023   PLT 487 (H) 04/01/2023   NA 129 (L) 04/01/2023   K 5.0 04/01/2023   CL 92 (L) 04/01/2023   CO2 20 04/01/2023   GLUCOSE 389 (H) 04/01/2023   BUN 22 (H) 04/01/2023   CREATININE 1.45 (H) 04/01/2023   BILITOT 0.3 04/01/2023   ALKPHOS 182 (H) 04/01/2023   AST 9 04/01/2023   ALT 12 04/01/2023   PROT 7.7 04/01/2023   ALBUMIN 3.6 (L)  04/01/2023   CALCIUM 9.3 04/01/2023   GFRAA >60 05/22/2019    Speciality Comments: No specialty comments available.  Procedures:  No procedures performed Allergies: Sulfa antibiotics and Lisinopril   Assessment / Plan:     Visit Diagnoses: Elevated rheumatoid factor - 04/01/23: RF 14.2, CRP 99, ESR 81, uric acid 4.8, Anti-CCP 7, ANA negative  Elevated C-reactive protein (CRP) - 04/01/23: CRP 99  Chronic joint pain  Upper extremity weakness  Hidradenitis suppurativa  Hypertension associated with diabetes (HCC)  Gastroparesis  Stage 3a chronic kidney disease (HCC)  History of type 2 diabetes mellitus  Thrombocytosis  Anxiety  Severe episode of recurrent major depressive disorder, without psychotic features (HCC)  Menorrhagia with regular cycle  Microcytic anemia  Prolonged QT interval  Orders: No orders of the defined types were placed in this encounter.  No orders of the defined types were placed in this encounter.   Face-to-face time spent with patient was *** minutes. Greater than 50% of time was spent in counseling and coordination of care.  Follow-Up Instructions: No follow-ups on file.   Gearldine Bienenstock, PA-C  Note - This record has been created using Dragon software.  Chart creation errors have been sought, but may not always  have been located. Such creation errors do not reflect on  the standard of medical care.

## 2023-09-11 ENCOUNTER — Encounter: Payer: Medicaid Other | Admitting: Rheumatology

## 2023-09-11 DIAGNOSIS — K3184 Gastroparesis: Secondary | ICD-10-CM

## 2023-09-11 DIAGNOSIS — R7982 Elevated C-reactive protein (CRP): Secondary | ICD-10-CM

## 2023-09-11 DIAGNOSIS — I152 Hypertension secondary to endocrine disorders: Secondary | ICD-10-CM

## 2023-09-11 DIAGNOSIS — Z8639 Personal history of other endocrine, nutritional and metabolic disease: Secondary | ICD-10-CM

## 2023-09-11 DIAGNOSIS — L732 Hidradenitis suppurativa: Secondary | ICD-10-CM

## 2023-09-11 DIAGNOSIS — R29898 Other symptoms and signs involving the musculoskeletal system: Secondary | ICD-10-CM

## 2023-09-11 DIAGNOSIS — R768 Other specified abnormal immunological findings in serum: Secondary | ICD-10-CM

## 2023-09-11 DIAGNOSIS — N1831 Chronic kidney disease, stage 3a: Secondary | ICD-10-CM

## 2023-09-11 DIAGNOSIS — G8929 Other chronic pain: Secondary | ICD-10-CM

## 2023-09-11 DIAGNOSIS — R9431 Abnormal electrocardiogram [ECG] [EKG]: Secondary | ICD-10-CM

## 2023-09-11 DIAGNOSIS — F419 Anxiety disorder, unspecified: Secondary | ICD-10-CM

## 2023-09-11 DIAGNOSIS — D509 Iron deficiency anemia, unspecified: Secondary | ICD-10-CM

## 2023-09-11 DIAGNOSIS — F332 Major depressive disorder, recurrent severe without psychotic features: Secondary | ICD-10-CM

## 2023-09-11 DIAGNOSIS — D75839 Thrombocytosis, unspecified: Secondary | ICD-10-CM

## 2023-09-11 DIAGNOSIS — N920 Excessive and frequent menstruation with regular cycle: Secondary | ICD-10-CM

## 2023-09-13 ENCOUNTER — Ambulatory Visit (INDEPENDENT_AMBULATORY_CARE_PROVIDER_SITE_OTHER): Payer: MEDICAID | Admitting: Family Medicine

## 2023-09-13 ENCOUNTER — Encounter: Payer: Self-pay | Admitting: Family Medicine

## 2023-09-13 ENCOUNTER — Ambulatory Visit: Payer: Self-pay

## 2023-09-13 VITALS — BP 132/76 | HR 93 | Resp 16 | Ht 61.0 in | Wt 238.0 lb

## 2023-09-13 DIAGNOSIS — L732 Hidradenitis suppurativa: Secondary | ICD-10-CM

## 2023-09-13 DIAGNOSIS — L02415 Cutaneous abscess of right lower limb: Secondary | ICD-10-CM | POA: Diagnosis not present

## 2023-09-13 DIAGNOSIS — Z8614 Personal history of Methicillin resistant Staphylococcus aureus infection: Secondary | ICD-10-CM

## 2023-09-13 MED ORDER — CLINDAMYCIN HCL 300 MG PO CAPS
300.0000 mg | ORAL_CAPSULE | Freq: Three times a day (TID) | ORAL | 0 refills | Status: DC
Start: 2023-09-13 — End: 2023-09-25

## 2023-09-13 NOTE — Patient Instructions (Signed)
Do warm compresses/soaks to your abscess 2 x a day to help encourage continued drainage and to prevent the opening from closing Cover with gauze or a bandage to prevent any further irritation or contamination Take the antibiotics three times a day for at LEAST the next 5 days, try to complete the 7 days and get follow up to recheck the infection  If anything worsens after 24-48 hours on the antibiotics then you  need to go to the ER for further evaluation and treatment  Skin Abscess  A skin abscess is an infected area on or under your skin. It contains pus and other material. An abscess may also be called a furuncle, carbuncle, or boil. It is often the result of an infection caused by bacteria. An abscess can occur in or on almost any part of your body. Sometimes, an abscess may break open (rupture) on its own. In most cases, it will keep getting worse unless it is treated. An abscess can cause pain and make you feel ill. An untreated abscess can cause infection to spread to other parts of your body or your bloodstream. The abscess may need to be drained. You may also need to take antibiotics. What are the causes? An abscess occurs when germs, like bacteria, pass through your skin and cause an infection. This may be caused by: A scrape or cut on your skin. A puncture wound through your skin, such as a needle injection or insect bite. Blocked oil or sweat glands. Blocked and infected hair follicles. A fluid-filled sac that forms beneath your skin (sebaceous cyst) and becomes infected. What increases the risk? You may be more likely to develop an abscess if: You have problems with blood circulation, or you have a weak body defense system (immune system). You have diabetes. You have dry and irritated skin. You get injections often or use IV drugs. You have a foreign body in a wound, such as a splinter. You smoke or use tobacco products. What are the signs or symptoms? Symptoms of this  condition include: A painful, firm bump under the skin. A bump with pus at the top. This may break through the skin and drain. Other symptoms include: Redness and swelling around the abscess. Warmth or tenderness. Swelling of the lymph nodes (glands) near the abscess. A sore on the skin. How is this diagnosed? This condition may be diagnosed based on a physical exam and your medical history. You may also have tests done, such as: A test of a sample of pus. This may be done to find what is causing the infection. Blood tests. Imaging tests, such as an ultrasound, CT scan, or MRI. How is this treated? A small abscess that drains on its own may not need to be treated. Treatment for larger abscesses may include: Moist heat or a heat pack applied to the area a few times a day. Incision and drainage. This is a procedure to drain the abscess. Antibiotics. For a severe abscess, you may first get antibiotics through an IV and then change to antibiotics by mouth. Follow these instructions at home: Medicines Take over-the-counter and prescription medicines only as told by your provider. If you were prescribed antibiotics, take them as told by your provider. Do not stop using the antibiotic even if you start to feel better. Abscess care  If you have an abscess that has not drained, apply heat to the affected area. Use the heat source that your provider recommends, such as a moist heat pack  or a heating pad. Place a towel between your skin and the heat source. Leave the heat on for 20-30 minutes at a time. If your skin turns bright red, remove the heat right away to prevent burns. The risk of burns is higher if you cannot feel pain, heat, or cold. Follow instructions from your provider about how to take care of your abscess. Make sure you: Cover the abscess with a bandage (dressing). Wash your hands with soap and water for at least 20 seconds before and after you change the dressing or gauze. If soap  and water are not available, use hand sanitizer. Change your dressing or gauze as told by your provider. Check your abscess every day for signs of an infection that is getting worse. Check for: More redness, swelling, pain, or tenderness. More fluid or blood. Warmth. More pus or a worse smell. General instructions To avoid spreading the infection: Do not share personal care items, towels, or hot tubs with others. Avoid making skin contact with other people. Be careful when getting rid of used dressings, wound packing, or any drainage from the abscess. Do not use any products that contain nicotine or tobacco. These products include cigarettes, chewing tobacco, and vaping devices, such as e-cigarettes. If you need help quitting, ask your provider. Do not use any creams, ointments, or liquids unless you have been told to by your provider. Contact a health care provider if: You see redness that spreads quickly or red streaks on your skin spreading away from the abscess. You have any signs of worse infection at the abscess. You vomit every time you eat or drink. You have a fever, chills, or muscle aches. The cyst or abscess returns. Get help right away if: You have severe pain. You make less pee (urine) than normal. This information is not intended to replace advice given to you by your health care provider. Make sure you discuss any questions you have with your health care provider. Document Revised: 06/27/2022 Document Reviewed: 06/27/2022 Elsevier Patient Education  2024 [employer].

## 2023-09-13 NOTE — Telephone Encounter (Signed)
  Chief Complaint: Diabetic sore on leg Symptoms: quarter sized sore on right leg - starting to look infected Frequency: 1 week Pertinent Negatives: Patient denies  Disposition: [] ED /[] Urgent Care (no appt availability in office) / [x] Appointment(In office/virtual)/ []  [facility]/ [] Home Care/ [] Refused Recommended Disposition /[] [facility]/ []  Follow-up with PCP Additional Notes: Pt states she has had these before. No appts at [facility] - scheduled pt at [facility] for today.    Reason for Disposition  [1] Looks infected (spreading redness, pus) AND [2] large red area (> 2 in. or 5 cm)  Answer Assessment - Initial Assessment Questions 1. APPEARANCE of SORES: "What do the sores look like?"     Size a quarter - starting to look infected 2. NUMBER: "How many sores are there?"     1 3. SIZE: "How big is the largest sore?"     Size of a quarter 4. LOCATION: "Where are the sores located?"     Right leg between knee and ankle - inner leg 5. ONSET: "When did the sores begin?"     1 week 6. TENDER: "Does it hurt when you touch it?"  (Scale 1-10; or mild, moderate, severe)      yes 7. CAUSE: "What do you think is causing the sores?"     Diabetic ulcer 8. OTHER SYMPTOMS: "Do you have any other symptoms?" (e.g., fever, new weakness)     no  Protocols used: Sores-A-AH

## 2023-09-13 NOTE — Progress Notes (Signed)
Patient ID: Theresa Malone, female    DOB: 09-20-1981, 42 y.o.   MRN: [id number]  PCP: [name], PA-C  Chief Complaint  Patient presents with   Leg Sore    Bump that is now painful w/pus. X1 week. R leg    Subjective:   Theresa Malone is a 42 y.o. female, presents to clinic with CC of the following:  HPI   Abscess to right inner lower leg onset of swelling and pain early this week It greadually got worse and she had a dried skin piece that she picked off with her nail and there was purulent and bloody drainage No improvement in the pain after it started draining  She has doxycyxline from [facility] clinic from a month ago 14 d supply (hasn't taken it regularly)     Patient Active Problem List   Diagnosis Date Noted   Chronic joint pain 04/10/2023   Iron deficiency anemia 04/04/2023   CKD (chronic kidney disease) stage 3, GFR 30-59 ml/min (HCC) 04/04/2023   Thrombocytosis 04/04/2023   Pain 02/15/2023   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   Amenorrhea 02/15/2023   Gastroparesis 10/23/2022   Anxiety 10/23/2022   Hypertensive urgency 10/21/2022   Type II diabetes mellitus with renal manifestations (HCC) 09/10/2022   Prolonged QT interval 09/10/2022   Leukocytosis 09/10/2022   Depression    Cyst of left ovary 07/21/2021   Microcytic anemia 04/17/2021   Hypertension associated with diabetes (HCC) 02/07/2016   Menorrhagia with regular cycle 02/07/2016   Status post cesarean section 01/25/2015   Hidradenitis suppurativa 07/20/2014      Current Outpatient Medications:    acetaminophen (TYLENOL) 650 MG CR tablet, Take 1 tablet (650 mg total) by mouth every 8 (eight) hours as needed for pain., Disp: 90 tablet, Rfl: 0   BD PEN NEEDLE MICRO U/F 32G X 6 MM MISC, Inject into the skin daily., Disp: , Rfl:    clindamycin-benzoyl peroxide (BENZACLIN) gel, Apply topically 2 (two) times daily., Disp: 25 g, Rfl: 0   Continuous Glucose Sensor (DEXCOM G7 SENSOR)  MISC, Change every 10 days use to continuously monitor blood sugar, Disp: 3 each, Rfl: 3   cyclobenzaprine (FLEXERIL) 5 MG tablet, Take 1 tablet by mouth three times daily as needed for muscle spasm, Disp: 30 tablet, Rfl: 1   Dulaglutide (TRULICITY) 0.75 MG/0.5ML SOPN, Inject 0.75 mg into the skin once a week., Disp: 2 mL, Rfl: 1   escitalopram (LEXAPRO) 20 MG tablet, Take 40 mg by mouth daily., Disp: , Rfl:    gabapentin (NEURONTIN) 300 MG capsule, Take 2 capsules by mouth twice daily, Disp: 120 capsule, Rfl: 0   insulin aspart (NOVOLOG FLEXPEN) 100 UNIT/ML FlexPen, CBG 70 - 120: 0 units CBG 121 - 150: 1 unit CBG 151 - 200: 2 units CBG 201 - 250: 3 units CBG 251 - 300: 5 units CBG 301 - 350: 7 units CBG 351 - 400: 9 units CBG > 400: call MD, Disp: 15 mL, Rfl: 11   losartan-hydrochlorothiazide (HYZAAR) 50-12.5 MG tablet, Take 1 tablet by mouth daily., Disp: 90 tablet, Rfl: 1   metFORMIN (GLUCOPHAGE) 1000 MG tablet, Take 1,000 mg by mouth 2 (two) times daily with a meal., Disp: , Rfl:    metoprolol tartrate (LOPRESSOR) 50 MG tablet, Take 1 tablet (50 mg total) by mouth 2 (two) times daily., Disp: 60 tablet, Rfl: 1   naproxen sodium (ALEVE) 220 MG tablet, Take 220 mg by mouth., Disp: ,  Rfl:    Allergies  Allergen Reactions   Sulfa Antibiotics Hives   Lisinopril Cough    cough     Social History   Tobacco Use   Smoking status: Never   Smokeless tobacco: Never  Vaping Use   Vaping status: Never Used  Substance Use Topics   Alcohol use: Not Currently    Comment: wine occ   Drug use: No      Chart Review Today: I personally reviewed active problem list, medication list, allergies, family history, social history, health maintenance, notes from last encounter, lab results, imaging with the patient/caregiver today.   Review of Systems  Constitutional: Negative.   HENT: Negative.    Eyes: Negative.   Respiratory: Negative.    Cardiovascular: Negative.   Gastrointestinal: Negative.    Endocrine: Negative.   Genitourinary: Negative.   Musculoskeletal: Negative.   Skin: Negative.   Allergic/Immunologic: Negative.   Neurological: Negative.   Hematological: Negative.   Psychiatric/Behavioral: Negative.    All other systems reviewed and are negative.      Objective:   Vitals:   09/13/23 0844  BP: 132/76  Pulse: 93  Resp: 16  SpO2: 98%  Weight: 238 lb (108 kg)  Height: 5\' 1"  (1.549 m)    Body mass index is 44.97 kg/m.  Physical Exam Vitals and nursing note reviewed.  Constitutional:      Appearance: She is well-developed. She is obese.  HENT:     Head: Normocephalic and atraumatic.     Nose: Nose normal.  Eyes:     General:        Right eye: No discharge.        Left eye: No discharge.     Conjunctiva/sclera: Conjunctivae normal.  Neck:     Trachea: No tracheal deviation.  Cardiovascular:     Rate and Rhythm: Normal rate and regular rhythm.  Pulmonary:     Effort: Pulmonary effort is normal. No respiratory distress.     Breath sounds: No stridor.  Skin:    General: Skin is warm and dry.     Findings: Abscess present. No rash.     Comments: Right lower leg inner calf abscess roughly 2 cm wide with ~3-4 mm opening with active drainage, surrounding tenderness and edema, no fluctuance No streaking erythema  Neurological:     Mental Status: She is alert.     Motor: No abnormal muscle tone.     Coordination: Coordination normal.  Psychiatric:        Mood and Affect: Mood normal.        Behavior: Behavior normal.      Results for orders placed or performed in visit on 06/26/23  POCT glycosylated hemoglobin (Hb A1C)  Result Value Ref Range   Hemoglobin A1C 10.3 (A) 4.0 - 5.6 %   HbA1c POC (<> result, manual entry)     HbA1c, POC (prediabetic range)     HbA1c, POC (controlled diabetic range)         Assessment & Plan:   1. Abscess of right lower leg Onset of tenderness and swelling over the past couple days, just started draining today,  hx of HS, cellulitis, uncontrolled DM and MRSA Recently on doxycycline for the past month - not taking correctly Will tx with clinda Inner calf - nothing drainable today, actively draining blood and purulence, warm soaks, reviewed wound care, encouraged good abx compliance and f/up Er precautions reviewed - Wound culture - clindamycin (CLEOCIN) 300 MG capsule; Take  1 capsule (300 mg total) by mouth 3 (three) times daily for 7 days.  Dispense: 21 capsule; Refill: 0  2. Hidradenitis  - Ambulatory referral to Dermatology - clindamycin (CLEOCIN) 300 MG capsule; Take 1 capsule (300 mg total) by mouth 3 (three) times daily for 7 days.  Dispense: 21 capsule; Refill: 0  3. History of MRSA infection  - clindamycin (CLEOCIN) 300 MG capsule; Take 1 capsule (300 mg total) by mouth 3 (three) times daily for 7 days.  Dispense: 21 capsule; Refill: 0      Danelle Berry, PA-C 09/13/23 9:12 AM

## 2023-09-19 ENCOUNTER — Other Ambulatory Visit: Payer: Self-pay

## 2023-09-19 ENCOUNTER — Encounter: Payer: Self-pay | Admitting: Emergency Medicine

## 2023-09-19 DIAGNOSIS — Z7984 Long term (current) use of oral hypoglycemic drugs: Secondary | ICD-10-CM

## 2023-09-19 DIAGNOSIS — E559 Vitamin D deficiency, unspecified: Secondary | ICD-10-CM | POA: Diagnosis present

## 2023-09-19 DIAGNOSIS — Z8614 Personal history of Methicillin resistant Staphylococcus aureus infection: Secondary | ICD-10-CM

## 2023-09-19 DIAGNOSIS — D509 Iron deficiency anemia, unspecified: Secondary | ICD-10-CM | POA: Diagnosis present

## 2023-09-19 DIAGNOSIS — Z888 Allergy status to other drugs, medicaments and biological substances status: Secondary | ICD-10-CM

## 2023-09-19 DIAGNOSIS — N1832 Chronic kidney disease, stage 3b: Secondary | ICD-10-CM | POA: Diagnosis present

## 2023-09-19 DIAGNOSIS — Z820 Family history of epilepsy and other diseases of the nervous system: Secondary | ICD-10-CM

## 2023-09-19 DIAGNOSIS — B951 Streptococcus, group B, as the cause of diseases classified elsewhere: Secondary | ICD-10-CM | POA: Diagnosis present

## 2023-09-19 DIAGNOSIS — N611 Abscess of the breast and nipple: Secondary | ICD-10-CM | POA: Diagnosis present

## 2023-09-19 DIAGNOSIS — E1122 Type 2 diabetes mellitus with diabetic chronic kidney disease: Secondary | ICD-10-CM | POA: Diagnosis present

## 2023-09-19 DIAGNOSIS — Z794 Long term (current) use of insulin: Secondary | ICD-10-CM

## 2023-09-19 DIAGNOSIS — Z8249 Family history of ischemic heart disease and other diseases of the circulatory system: Secondary | ICD-10-CM

## 2023-09-19 DIAGNOSIS — L02415 Cutaneous abscess of right lower limb: Secondary | ICD-10-CM | POA: Diagnosis present

## 2023-09-19 DIAGNOSIS — E1169 Type 2 diabetes mellitus with other specified complication: Secondary | ICD-10-CM | POA: Diagnosis present

## 2023-09-19 DIAGNOSIS — I152 Hypertension secondary to endocrine disorders: Secondary | ICD-10-CM | POA: Diagnosis present

## 2023-09-19 DIAGNOSIS — L03115 Cellulitis of right lower limb: Principal | ICD-10-CM | POA: Diagnosis present

## 2023-09-19 DIAGNOSIS — L732 Hidradenitis suppurativa: Secondary | ICD-10-CM | POA: Diagnosis present

## 2023-09-19 DIAGNOSIS — Z833 Family history of diabetes mellitus: Secondary | ICD-10-CM

## 2023-09-19 DIAGNOSIS — Z79899 Other long term (current) drug therapy: Secondary | ICD-10-CM

## 2023-09-19 DIAGNOSIS — Z825 Family history of asthma and other chronic lower respiratory diseases: Secondary | ICD-10-CM

## 2023-09-19 DIAGNOSIS — Z6841 Body Mass Index (BMI) 40.0 and over, adult: Secondary | ICD-10-CM

## 2023-09-19 DIAGNOSIS — Z1629 Resistance to other single specified antibiotic: Secondary | ICD-10-CM | POA: Diagnosis present

## 2023-09-19 DIAGNOSIS — E1165 Type 2 diabetes mellitus with hyperglycemia: Secondary | ICD-10-CM | POA: Diagnosis present

## 2023-09-19 DIAGNOSIS — Z809 Family history of malignant neoplasm, unspecified: Secondary | ICD-10-CM

## 2023-09-19 DIAGNOSIS — M109 Gout, unspecified: Secondary | ICD-10-CM | POA: Diagnosis present

## 2023-09-19 DIAGNOSIS — Z882 Allergy status to sulfonamides status: Secondary | ICD-10-CM

## 2023-09-19 DIAGNOSIS — E875 Hyperkalemia: Secondary | ICD-10-CM | POA: Diagnosis not present

## 2023-09-19 DIAGNOSIS — I878 Other specified disorders of veins: Secondary | ICD-10-CM | POA: Diagnosis present

## 2023-09-19 MED ORDER — OXYCODONE-ACETAMINOPHEN 5-325 MG PO TABS
1.0000 | ORAL_TABLET | ORAL | Status: AC | PRN
  Administered 2023-09-19 – 2023-09-20 (×2): 1 via ORAL
  Filled 2023-09-19 (×2): qty 1

## 2023-09-19 NOTE — ED Triage Notes (Signed)
Pt has had cellulitis to right lower leg.  Had tx at [facility] with clindamycin for which she states culture grew out mrsa.  Pt states she finished the course of anbx, 7 days, and wound continues to drain.

## 2023-09-20 ENCOUNTER — Emergency Department: Payer: MEDICAID

## 2023-09-20 ENCOUNTER — Encounter: Payer: Self-pay | Admitting: Family Medicine

## 2023-09-20 ENCOUNTER — Inpatient Hospital Stay
Admission: EM | Admit: 2023-09-20 | Discharge: 2023-09-25 | DRG: 571 | Disposition: A | Discharge status: Home / Self Care | Payer: MEDICAID | Attending: Internal Medicine | Admitting: Internal Medicine

## 2023-09-20 DIAGNOSIS — E669 Obesity, unspecified: Secondary | ICD-10-CM

## 2023-09-20 DIAGNOSIS — Z8614 Personal history of Methicillin resistant Staphylococcus aureus infection: Secondary | ICD-10-CM | POA: Diagnosis not present

## 2023-09-20 DIAGNOSIS — Z79899 Other long term (current) drug therapy: Secondary | ICD-10-CM | POA: Diagnosis not present

## 2023-09-20 DIAGNOSIS — Z888 Allergy status to other drugs, medicaments and biological substances status: Secondary | ICD-10-CM | POA: Diagnosis not present

## 2023-09-20 DIAGNOSIS — L02419 Cutaneous abscess of limb, unspecified: Secondary | ICD-10-CM

## 2023-09-20 DIAGNOSIS — E1365 Other specified diabetes mellitus with hyperglycemia: Secondary | ICD-10-CM

## 2023-09-20 DIAGNOSIS — L02415 Cutaneous abscess of right lower limb: Secondary | ICD-10-CM | POA: Diagnosis present

## 2023-09-20 DIAGNOSIS — I10 Essential (primary) hypertension: Secondary | ICD-10-CM

## 2023-09-20 DIAGNOSIS — N611 Abscess of the breast and nipple: Secondary | ICD-10-CM | POA: Diagnosis present

## 2023-09-20 DIAGNOSIS — E875 Hyperkalemia: Secondary | ICD-10-CM | POA: Diagnosis not present

## 2023-09-20 DIAGNOSIS — I152 Hypertension secondary to endocrine disorders: Secondary | ICD-10-CM | POA: Diagnosis present

## 2023-09-20 DIAGNOSIS — E1121 Type 2 diabetes mellitus with diabetic nephropathy: Secondary | ICD-10-CM | POA: Diagnosis not present

## 2023-09-20 DIAGNOSIS — L732 Hidradenitis suppurativa: Secondary | ICD-10-CM

## 2023-09-20 DIAGNOSIS — E1129 Type 2 diabetes mellitus with other diabetic kidney complication: Secondary | ICD-10-CM | POA: Diagnosis present

## 2023-09-20 DIAGNOSIS — I878 Other specified disorders of veins: Secondary | ICD-10-CM | POA: Diagnosis present

## 2023-09-20 DIAGNOSIS — E1159 Type 2 diabetes mellitus with other circulatory complications: Secondary | ICD-10-CM | POA: Diagnosis present

## 2023-09-20 DIAGNOSIS — L0390 Cellulitis, unspecified: Secondary | ICD-10-CM | POA: Diagnosis present

## 2023-09-20 DIAGNOSIS — Z7984 Long term (current) use of oral hypoglycemic drugs: Secondary | ICD-10-CM | POA: Diagnosis not present

## 2023-09-20 DIAGNOSIS — L03115 Cellulitis of right lower limb: Principal | ICD-10-CM | POA: Diagnosis present

## 2023-09-20 DIAGNOSIS — Z8249 Family history of ischemic heart disease and other diseases of the circulatory system: Secondary | ICD-10-CM | POA: Diagnosis not present

## 2023-09-20 DIAGNOSIS — N1832 Chronic kidney disease, stage 3b: Secondary | ICD-10-CM | POA: Diagnosis present

## 2023-09-20 DIAGNOSIS — B951 Streptococcus, group B, as the cause of diseases classified elsewhere: Secondary | ICD-10-CM | POA: Diagnosis present

## 2023-09-20 DIAGNOSIS — Z1629 Resistance to other single specified antibiotic: Secondary | ICD-10-CM | POA: Diagnosis present

## 2023-09-20 DIAGNOSIS — Z794 Long term (current) use of insulin: Secondary | ICD-10-CM | POA: Diagnosis not present

## 2023-09-20 DIAGNOSIS — Z6841 Body Mass Index (BMI) 40.0 and over, adult: Secondary | ICD-10-CM | POA: Diagnosis not present

## 2023-09-20 DIAGNOSIS — E1169 Type 2 diabetes mellitus with other specified complication: Secondary | ICD-10-CM | POA: Diagnosis present

## 2023-09-20 DIAGNOSIS — M109 Gout, unspecified: Secondary | ICD-10-CM | POA: Diagnosis present

## 2023-09-20 DIAGNOSIS — E559 Vitamin D deficiency, unspecified: Secondary | ICD-10-CM | POA: Diagnosis present

## 2023-09-20 DIAGNOSIS — E1165 Type 2 diabetes mellitus with hyperglycemia: Secondary | ICD-10-CM | POA: Diagnosis present

## 2023-09-20 DIAGNOSIS — D509 Iron deficiency anemia, unspecified: Secondary | ICD-10-CM | POA: Diagnosis present

## 2023-09-20 DIAGNOSIS — E1122 Type 2 diabetes mellitus with diabetic chronic kidney disease: Secondary | ICD-10-CM | POA: Diagnosis present

## 2023-09-20 DIAGNOSIS — N183 Chronic kidney disease, stage 3 unspecified: Secondary | ICD-10-CM | POA: Diagnosis present

## 2023-09-20 LAB — CBC WITH DIFFERENTIAL/PLATELET
Abs Immature Granulocytes: 0.08 10*3/uL — ABNORMAL HIGH (ref 0.00–0.07)
Basophils Absolute: 0 10*3/uL (ref 0.0–0.1)
Basophils Relative: 0 %
Eosinophils Absolute: 0.2 10*3/uL (ref 0.0–0.5)
Eosinophils Relative: 2 %
HCT: 30 % — ABNORMAL LOW (ref 36.0–46.0)
Hemoglobin: 9.4 g/dL — ABNORMAL LOW (ref 12.0–15.0)
Immature Granulocytes: 1 %
Lymphocytes Relative: 18 %
Lymphs Abs: 2.1 10*3/uL (ref 0.7–4.0)
MCH: 25.9 pg — ABNORMAL LOW (ref 26.0–34.0)
MCHC: 31.3 g/dL (ref 30.0–36.0)
MCV: 82.6 fL (ref 80.0–100.0)
Monocytes Absolute: 0.8 10*3/uL (ref 0.1–1.0)
Monocytes Relative: 7 %
Neutro Abs: 8.2 10*3/uL — ABNORMAL HIGH (ref 1.7–7.7)
Neutrophils Relative %: 72 %
Platelets: 479 10*3/uL — ABNORMAL HIGH (ref 150–400)
RBC: 3.63 MIL/uL — ABNORMAL LOW (ref 3.87–5.11)
RDW: 14.8 % (ref 11.5–15.5)
WBC: 11.4 10*3/uL — ABNORMAL HIGH (ref 4.0–10.5)
nRBC: 0 % (ref 0.0–0.2)

## 2023-09-20 LAB — HCG, QUANTITATIVE, PREGNANCY: hCG, Beta Chain, Quant, S: <1 m[IU]/mL (ref <5)

## 2023-09-20 LAB — BASIC METABOLIC PANEL
Anion gap: 10 (ref 5–15)
BUN: 25 mg/dL — ABNORMAL HIGH (ref 6–20)
CO2: 23 mmol/L (ref 22–32)
Calcium: 8.8 mg/dL — ABNORMAL LOW (ref 8.9–10.3)
Chloride: 93 mmol/L — ABNORMAL LOW (ref 98–111)
Creatinine, Ser: 1.6 mg/dL — ABNORMAL HIGH (ref 0.44–1.00)
GFR, Estimated: 42 mL/min — ABNORMAL LOW (ref >60)
Glucose, Bld: 472 mg/dL — ABNORMAL HIGH (ref 70–99)
Potassium: 5 mmol/L (ref 3.5–5.1)
Sodium: 126 mmol/L — ABNORMAL LOW (ref 135–145)

## 2023-09-20 LAB — HIV ANTIBODY (ROUTINE TESTING W REFLEX): HIV Screen 4th Generation wRfx: NONREACTIVE

## 2023-09-20 LAB — GLUCOSE, CAPILLARY
Glucose-Capillary: 430 mg/dL — ABNORMAL HIGH (ref 70–99)
Glucose-Capillary: 414 mg/dL — ABNORMAL HIGH (ref 70–99)
Glucose-Capillary: 373 mg/dL — ABNORMAL HIGH (ref 70–99)

## 2023-09-20 LAB — C-REACTIVE PROTEIN: CRP: 12.7 mg/dL — ABNORMAL HIGH (ref <1.0)

## 2023-09-20 LAB — SEDIMENTATION RATE: Sed Rate: 108 mm/h — ABNORMAL HIGH (ref 0–20)

## 2023-09-20 LAB — LACTIC ACID, PLASMA: Lactic Acid, Venous: 1.9 mmol/L (ref 0.5–1.9)

## 2023-09-20 LAB — PREALBUMIN: Prealbumin: 19 mg/dL (ref 18–38)

## 2023-09-20 MED ORDER — JUVEN PO PACK
1.0000 | PACK | Freq: Two times a day (BID) | ORAL | Status: DC
  Administered 2023-09-20 – 2023-09-25 (×6): 1 via ORAL

## 2023-09-20 MED ORDER — ENOXAPARIN SODIUM 40 MG/0.4ML IJ SOSY: 40.0000 mg | PREFILLED_SYRINGE | INTRAMUSCULAR | Status: DC

## 2023-09-20 MED ORDER — ACETAMINOPHEN 325 MG PO TABS
650.0000 mg | ORAL_TABLET | Freq: Four times a day (QID) | ORAL | Status: DC | PRN
  Filled 2023-09-20: qty 2

## 2023-09-20 MED ORDER — INSULIN GLARGINE-YFGN 100 UNIT/ML ~~LOC~~ SOLN
12.0000 [IU] | Freq: Every day | SUBCUTANEOUS | Status: DC
  Filled 2023-09-20: qty 0.12

## 2023-09-20 MED ORDER — SODIUM CHLORIDE 0.9 % IV BOLUS
1000.0000 mL | Freq: Once | INTRAVENOUS | Status: AC
  Administered 2023-09-20: 1000 mL via INTRAVENOUS

## 2023-09-20 MED ORDER — DIPHENHYDRAMINE HCL 50 MG/ML IJ SOLN
25.0000 mg | Freq: Once | INTRAMUSCULAR | Status: AC
  Administered 2023-09-20: 25 mg via INTRAVENOUS
  Filled 2023-09-20: qty 1

## 2023-09-20 MED ORDER — METRONIDAZOLE 500 MG/100ML IV SOLN: 500.0000 mg | Freq: Three times a day (TID) | INTRAVENOUS | Status: DC

## 2023-09-20 MED ORDER — IOHEXOL 300 MG/ML  SOLN
70.0000 mL | Freq: Once | INTRAMUSCULAR | Status: AC | PRN
Start: 2023-09-20 — End: 2023-09-20
  Administered 2023-09-20: 70 mL via INTRAVENOUS

## 2023-09-20 MED ORDER — OXYCODONE-ACETAMINOPHEN 5-325 MG PO TABS: 1.0000 | ORAL_TABLET | Freq: Once | ORAL | Status: DC

## 2023-09-20 MED ORDER — ENSURE ENLIVE PO LIQD
237.0000 mL | Freq: Every day | ORAL | Status: DC
  Administered 2023-09-24: 237 mL via ORAL

## 2023-09-20 MED ORDER — SODIUM CHLORIDE 0.9 % IV BOLUS
1000.0000 mL | Freq: Once | INTRAVENOUS | Status: AC
Start: 2023-09-20 — End: 2023-09-20
  Administered 2023-09-20: 1000 mL via INTRAVENOUS

## 2023-09-20 MED ORDER — SODIUM CHLORIDE 0.9 % IV SOLN
2.0000 g | INTRAVENOUS | Status: DC
  Administered 2023-09-21 – 2023-09-23 (×3): 2 g via INTRAVENOUS
  Filled 2023-09-20 (×3): qty 20

## 2023-09-20 MED ORDER — INSULIN ASPART 100 UNIT/ML IJ SOLN
0.0000 [IU] | Freq: Every day | INTRAMUSCULAR | Status: DC
  Filled 2023-09-20: qty 1

## 2023-09-20 MED ORDER — ONDANSETRON HCL 4 MG/2ML IJ SOLN
4.0000 mg | Freq: Four times a day (QID) | INTRAMUSCULAR | Status: DC | PRN
  Administered 2023-09-23 (×2): 4 mg via INTRAVENOUS
  Filled 2023-09-20: qty 2

## 2023-09-20 MED ORDER — INSULIN ASPART 100 UNIT/ML IJ SOLN
0.0000 [IU] | Freq: Every day | INTRAMUSCULAR | Status: DC
  Administered 2023-09-20: 5 [IU] via SUBCUTANEOUS
  Administered 2023-09-22: 4 [IU] via SUBCUTANEOUS
  Administered 2023-09-23: 5 [IU] via SUBCUTANEOUS
  Filled 2023-09-20 (×2): qty 1

## 2023-09-20 MED ORDER — SODIUM CHLORIDE 0.9 % IV SOLN
2.0000 g | Freq: Once | INTRAVENOUS | Status: AC
Start: 2023-09-20 — End: 2023-09-20
  Administered 2023-09-20: 2 g via INTRAVENOUS
  Filled 2023-09-20: qty 20

## 2023-09-20 MED ORDER — ONDANSETRON HCL 4 MG PO TABS: 4.0000 mg | ORAL_TABLET | Freq: Four times a day (QID) | ORAL | Status: DC | PRN

## 2023-09-20 MED ORDER — VANCOMYCIN HCL 750 MG/150ML IV SOLN
750.0000 mg | INTRAVENOUS | Status: DC
  Filled 2023-09-20: qty 150

## 2023-09-20 MED ORDER — MORPHINE SULFATE (PF) 4 MG/ML IV SOLN
4.0000 mg | Freq: Once | INTRAVENOUS | Status: AC
Start: 2023-09-20 — End: 2023-09-20
  Administered 2023-09-20: 4 mg via INTRAVENOUS
  Filled 2023-09-20: qty 1

## 2023-09-20 MED ORDER — SODIUM CHLORIDE 0.9 % IV SOLN: 2.0000 g | INTRAVENOUS | Status: DC

## 2023-09-20 MED ORDER — VANCOMYCIN HCL IN DEXTROSE 1-5 GM/200ML-% IV SOLN
1000.0000 mg | Freq: Once | INTRAVENOUS | Status: AC
  Administered 2023-09-20: 1000 mg via INTRAVENOUS
  Filled 2023-09-20: qty 200

## 2023-09-20 MED ORDER — VANCOMYCIN HCL 2000 MG/400ML IV SOLN
2000.0000 mg | Freq: Once | INTRAVENOUS | Status: DC
  Filled 2023-09-20: qty 400

## 2023-09-20 MED ORDER — INSULIN ASPART 100 UNIT/ML IJ SOLN
10.0000 [IU] | Freq: Once | INTRAMUSCULAR | Status: AC
  Administered 2023-09-20: 10 [IU] via SUBCUTANEOUS
  Filled 2023-09-20: qty 1

## 2023-09-20 MED ORDER — INSULIN ASPART 100 UNIT/ML IJ SOLN: 0.0000 [IU] | Freq: Three times a day (TID) | INTRAMUSCULAR | Status: DC

## 2023-09-20 MED ORDER — INSULIN GLARGINE-YFGN 100 UNIT/ML ~~LOC~~ SOLN
12.0000 [IU] | Freq: Every day | SUBCUTANEOUS | Status: DC
  Administered 2023-09-20 – 2023-09-21 (×2): 12 [IU] via SUBCUTANEOUS
  Filled 2023-09-20 (×2): qty 0.12

## 2023-09-20 MED ORDER — ADULT MULTIVITAMIN W/MINERALS CH
1.0000 | ORAL_TABLET | Freq: Every day | ORAL | Status: DC
Start: 2023-09-20 — End: 2023-09-25
  Administered 2023-09-20 – 2023-09-25 (×5): 1 via ORAL
  Filled 2023-09-20 (×5): qty 1

## 2023-09-20 MED ORDER — METRONIDAZOLE 500 MG/100ML IV SOLN
500.0000 mg | Freq: Three times a day (TID) | INTRAVENOUS | Status: DC
  Administered 2023-09-20 – 2023-09-23 (×8): 500 mg via INTRAVENOUS
  Filled 2023-09-20 (×10): qty 100

## 2023-09-20 MED ORDER — SODIUM CHLORIDE 0.9 % IV BOLUS
500.0000 mL | Freq: Once | INTRAVENOUS | Status: AC
  Administered 2023-09-20: 500 mL via INTRAVENOUS

## 2023-09-20 MED ORDER — SODIUM CHLORIDE 0.9% FLUSH
10.0000 mL | Freq: Two times a day (BID) | INTRAVENOUS | Status: DC
  Administered 2023-09-20 – 2023-09-25 (×11): 10 mL via INTRAVENOUS

## 2023-09-20 MED ORDER — HYDROXYZINE HCL 50 MG PO TABS
25.0000 mg | ORAL_TABLET | ORAL | Status: DC | PRN
  Administered 2023-09-20 – 2023-09-25 (×10): 25 mg via ORAL
  Filled 2023-09-20 (×10): qty 1

## 2023-09-20 MED ORDER — INSULIN ASPART 100 UNIT/ML IJ SOLN
6.0000 [IU] | Freq: Three times a day (TID) | INTRAMUSCULAR | Status: DC
  Administered 2023-09-21 – 2023-09-23 (×7): 6 [IU] via SUBCUTANEOUS
  Filled 2023-09-20 (×7): qty 1

## 2023-09-20 MED ORDER — INSULIN ASPART 100 UNIT/ML IJ SOLN
0.0000 [IU] | Freq: Three times a day (TID) | INTRAMUSCULAR | Status: DC
  Administered 2023-09-21: 3 [IU] via SUBCUTANEOUS
  Administered 2023-09-21 (×2): 11 [IU] via SUBCUTANEOUS
  Administered 2023-09-22 (×2): 8 [IU] via SUBCUTANEOUS
  Administered 2023-09-23: 5 [IU] via SUBCUTANEOUS
  Administered 2023-09-23: 3 [IU] via SUBCUTANEOUS
  Administered 2023-09-23: 8 [IU] via SUBCUTANEOUS
  Filled 2023-09-20 (×8): qty 1

## 2023-09-20 MED ORDER — MORPHINE SULFATE (PF) 4 MG/ML IV SOLN
4.0000 mg | Freq: Once | INTRAVENOUS | Status: AC
  Administered 2023-09-20: 4 mg via INTRAVENOUS
  Filled 2023-09-20: qty 1

## 2023-09-20 MED ORDER — MORPHINE SULFATE (PF) 4 MG/ML IV SOLN
4.0000 mg | INTRAVENOUS | Status: DC | PRN
  Administered 2023-09-20 – 2023-09-21 (×5): 4 mg via INTRAVENOUS
  Filled 2023-09-20 (×5): qty 1

## 2023-09-20 MED ORDER — ENOXAPARIN SODIUM 60 MG/0.6ML IJ SOSY
50.0000 mg | PREFILLED_SYRINGE | INTRAMUSCULAR | Status: DC
  Administered 2023-09-20 – 2023-09-24 (×5): 50 mg via SUBCUTANEOUS
  Filled 2023-09-20 (×5): qty 0.6

## 2023-09-20 NOTE — Assessment & Plan Note (Signed)
Blood sugar in 400s without overt acidosis Will place on long-acting insulin sliding scale A1c Monitor

## 2023-09-20 NOTE — Assessment & Plan Note (Signed)
BP 140s to 170s over 90s to 100s. Titrate home regimen Monitor

## 2023-09-20 NOTE — ED Notes (Addendum)
Pt family came to visit, pt resting and denies needs at this time. Hospitals informed of pt itching face and hot face, informed pt was given benadryl, continue to monitor per Dr. Alvester Morin

## 2023-09-20 NOTE — Assessment & Plan Note (Signed)
Creatinine 1.6 with GFR in the 40s Appears near baseline Monitor

## 2023-09-20 NOTE — H&P (Signed)
History and Physical    Patient: Theresa Malone ZOX:[id number] DOB: Jan 02, 1981 DOA: 09/20/2023 DOS: the patient was seen and examined on 09/20/2023 PCP: [name], PA-C  Patient coming from: Home  Chief Complaint:  Chief Complaint  Patient presents with   Cellulitis   HPI: Theresa Malone is a 42 y.o. female with medical history significant of obesity, hidradenitis suppurativa, gout, hypertension, poorly controlled type 2 diabetes, venous stasis changes presenting with right lower extremity cellulitis.  Patient reports worsening right extremity wound over the past 1 to 2 weeks.  Was evaluated October 18 at primary care office.  Was placed on course of clindamycin for treatment.  Wound culture grew out strep agalactiae.  Noted prior history of MRSA infection per report.  Patient reports worsening symptoms despite treatment.  Blood sugars to 200s to 300s at home.  Positive generalized malaise.  No chest pain or shortness of breath.  No nausea or vomiting.  No reported alcohol or tobacco use. Presented to the ER afebrile, heart rate in 100s, BP stable.  Satting well on room air.  White count 11.4, hemoglobin 9.4, platelets 479, lactate 1.9, creatinine 1.6, glucose 472.  CT imaging with noted cutaneous wound at the mid shin with surrounding cellulitis.  No definitive abscess. Review of Systems: As mentioned in the history of present illness. All other systems reviewed and are negative. Past Medical History:  Diagnosis Date   Anxiety    panic attacks   Cellulitis    Depression    Diabetes mellitus    Gout    Hidradenitis suppurativa    Hypertension    Obesity    Venous stasis    Venous stasis dermatitis    Past Surgical History:  Procedure Laterality Date   CESAREAN SECTION     C/S x 2   CESAREAN SECTION N/A 01/24/2015   Procedure: CESAREAN SECTION;  Surgeon: Catalina Antigua, MD;  Location: [facility];  Service: Obstetrics;  Laterality: N/A;   TUBAL LIGATION     Social History:   reports that she has never smoked. She has never used smokeless tobacco. She reports that she does not currently use alcohol. She reports that she does not use drugs.  Allergies  Allergen Reactions   Sulfa Antibiotics Hives   Lisinopril Cough    cough    Family History  Problem Relation Age of Onset   Diabetes Paternal Grandfather    Cancer Paternal Grandfather        liver   Cancer Paternal Grandmother        liver   Hypertension Father    Diabetes Mother    Hypertension Mother    Diabetes Maternal Uncle    Diabetes Maternal Grandmother    Cancer Maternal Grandfather    Diabetes Daughter        boarderline    Asthma Daughter    Bronchitis Daughter    Bronchitis Son    Bronchitis Daughter     Prior to Admission medications   Medication Sig Start Date End Date Taking? Authorizing Provider  cyclobenzaprine (FLEXERIL) 5 MG tablet Take 1 tablet by mouth three times daily as needed for muscle spasm 06/21/23  Yes Malva Limes, MD  naproxen sodium (ALEVE) 220 MG tablet Take 220 mg by mouth.   Yes [provider]  acetaminophen (TYLENOL) 650 MG CR tablet Take 1 tablet (650 mg total) by mouth every 8 (eight) hours as needed for pain. 04/09/23   Alfredia Ferguson, PA-C  BD PEN NEEDLE  MICRO U/F 32G X 6 MM MISC Inject into the skin daily. 08/27/22   [provider]  clindamycin (CLEOCIN) 300 MG capsule Take 1 capsule (300 mg total) by mouth 3 (three) times daily for 7 days. 09/13/23 09/20/23  Danelle Berry, PA-C  clindamycin-benzoyl peroxide (BENZACLIN) gel Apply topically 2 (two) times daily. Patient not taking: Reported on 09/20/2023 06/26/23   [name], PA-C  Continuous Glucose Sensor (DEXCOM G7 SENSOR) MISC Change every 10 days use to continuously monitor blood sugar 05/02/23   Drubel, Lillia Abed, PA-C  Dulaglutide (TRULICITY) 0.75 MG/0.5ML SOPN Inject 0.75 mg into the skin once a week. Patient not taking: Reported on 09/20/2023 05/08/23   Alfredia Ferguson, PA-C   escitalopram (LEXAPRO) 20 MG tablet Take 40 mg by mouth daily. 08/27/22   [provider]  gabapentin (NEURONTIN) 300 MG capsule Take 2 capsules by mouth twice daily 08/02/23   Ostwalt, Edmon Crape, PA-C  insulin aspart (NOVOLOG FLEXPEN) 100 UNIT/ML FlexPen CBG 70 - 120: 0 units CBG 121 - 150: 1 unit CBG 151 - 200: 2 units CBG 201 - 250: 3 units CBG 251 - 300: 5 units CBG 301 - 350: 7 units CBG 351 - 400: 9 units CBG > 400: call MD 09/14/22   Miguel Rota, MD  losartan-hydrochlorothiazide (HYZAAR) 50-12.5 MG tablet Take 1 tablet by mouth daily. 04/09/23   Alfredia Ferguson, PA-C  metFORMIN (GLUCOPHAGE) 1000 MG tablet Take 1,000 mg by mouth 2 (two) times daily with a meal.    [provider]  metoprolol tartrate (LOPRESSOR) 50 MG tablet Take 1 tablet (50 mg total) by mouth 2 (two) times daily. 04/01/23   Alfredia Ferguson, PA-C  clonazepam (KLONOPIN) 0.125 MG disintegrating tablet Take 1 tablet (0.125 mg total) by mouth 2 (two) times daily. Patient not taking: Reported on 05/19/2019 04/15/19 05/21/19  Catarina Hartshorn, MD    Physical Exam: Vitals:   09/19/23 2352 09/20/23 0500 09/20/23 0800 09/20/23 1135  BP: (!) 175/108 (!) 168/99 (!) 155/89 (!) 141/96  Pulse: (!) 116 (!) 108 (!) 119 (!) 110  Resp: (!) 21 20 17 16   Temp: 98.6 F (37 C) 98.1 F (36.7 C) 98.3 F (36.8 C)   TempSrc: Oral Oral Oral   SpO2: 99% 98% 100% 100%  Weight: 105.2 kg     Height: 5\' 1"  (1.549 m)      Physical Exam Constitutional:      Appearance: She is obese.  HENT:     Head: Normocephalic.     Mouth/Throat:     Mouth: Mucous membranes are moist.  Eyes:     Pupils: Pupils are equal, round, and reactive to light.  Cardiovascular:     Rate and Rhythm: Normal rate.  Pulmonary:     Effort: Pulmonary effort is normal.     Breath sounds: Normal breath sounds.  Abdominal:     General: Bowel sounds are normal.  Musculoskeletal:        General: Normal range of motion.  Skin:    Comments: See picture  + ttp over  affected area   Neurological:     General: No focal deficit present.  Psychiatric:        Mood and Affect: Mood normal.     Data Reviewed:  There are no new results to review at this time.  CT TIBIA FIBULA RIGHT W CONTRAST CLINICAL DATA:  Cellulitis of the right lower leg. Treatment with antibiotics. Wound continues to drain.  EXAM: CT OF THE LOWER RIGHT EXTREMITY WITH  CONTRAST  TECHNIQUE: Multidetector CT imaging of the lower right extremity was performed according to the standard protocol following intravenous contrast administration.  RADIATION DOSE REDUCTION: This exam was performed according to the departmental dose-optimization program which includes automated exposure control, adjustment of the mA and/or kV according to patient size and/or use of iterative reconstruction technique.  CONTRAST:  70mL OMNIPAQUE IOHEXOL 300 MG/ML  SOLN  COMPARISON:  None Available.  FINDINGS: Bones/Joint/Cartilage  No fracture or dislocation. Normal alignment. No joint effusion.  Ligaments  Ligaments are suboptimally evaluated by CT.  Muscles and Tendons Muscles are normal. No intramuscular fluid collection or hematoma.  Soft tissue There is a cutaneous wound at the anteromedial aspect of the mid shin with surrounding skin thickening and underlying subcutaneous edema. No discrete fluid collection identified. No soft tissue gas. Diffuse subcutaneous edema extends distally from the level of the wound to the ankle.  IMPRESSION: 1. Cutaneous wound at the right anteromedial mid shin with surrounding cellulitis. No fluid collection identified. No soft tissue gas. 2. No acute osseous abnormality.  Electronically Signed   By: Hart Robinsons M.D.   On: 09/20/2023 10:45 US Venous Img Lower Unilateral Right CLINICAL DATA:  Right lower extremity swelling, pain, and edema. Recently diagnosed with cellulitis and just finished antibiotics.  EXAM: Right LOWER EXTREMITY VENOUS  DOPPLER ULTRASOUND  TECHNIQUE: Gray-scale sonography with compression, as well as color and duplex ultrasound, were performed to evaluate the deep venous system(s) from the level of the common femoral vein through the popliteal and proximal calf veins.  COMPARISON:  None Available.  FINDINGS: VENOUS  Normal compressibility of the common femoral, superficial femoral, and popliteal veins, as well as the visualized calf veins. Visualized portions of profunda femoral vein and great saphenous vein unremarkable. No filling defects to suggest DVT on grayscale or color Doppler imaging. Doppler waveforms show normal direction of venous flow, normal respiratory plasticity and response to augmentation.  Limited views of the contralateral common femoral vein are unremarkable.  OTHER  None.  Limitations: none  IMPRESSION: No evidence of acute deep venous thrombosis in the visualized lower extremity veins.  Electronically Signed   By: Burman Nieves M.D.   On: 09/20/2023 02:14  Lab Results  Component Value Date   WBC 11.4 (H) 09/19/2023   HGB 9.4 (L) 09/19/2023   HCT 30.0 (L) 09/19/2023   MCV 82.6 09/19/2023   PLT 479 (H) 09/19/2023   Last metabolic panel Lab Results  Component Value Date   GLUCOSE 472 (H) 09/19/2023   NA 126 (L) 09/19/2023   K 5.0 09/19/2023   CL 93 (L) 09/19/2023   CO2 23 09/19/2023   BUN 25 (H) 09/19/2023   CREATININE 1.60 (H) 09/19/2023   GFRNONAA 42 (L) 09/19/2023   CALCIUM 8.8 (L) 09/19/2023   PHOS 5.3 (H) 10/21/2022   PROT 7.7 04/01/2023   ALBUMIN 3.6 (L) 04/01/2023   LABGLOB 4.1 04/01/2023   AGRATIO 0.9 (L) 04/01/2023   BILITOT 0.3 04/01/2023   ALKPHOS 182 (H) 04/01/2023   AST 9 04/01/2023   ALT 12 04/01/2023   ANIONGAP 10 09/19/2023    Assessment and Plan: Cellulitis of right lower extremity Worsening right lower extremity cellulitis despite treatment with outpatient course of clindamycin Noted October 18 wound culture growing  strep agalactiae White count 11.4 CT imaging negative for abscess but is showing signs concerning for cellulitis Will place on IV Rocephin, Flagyl vancomycin for infectious coverage for now Panculture MRSA swab Blood sugar control in setting of type  2 diabetes Follow  Type II diabetes mellitus with renal manifestations (HCC) Blood sugar in 400s without overt acidosis Will place on long-acting insulin sliding scale A1c Monitor  Hypertension associated with diabetes (HCC) BP 140s to 170s over 90s to 100s. Titrate home regimen Monitor  CKD (chronic kidney disease) stage 3, GFR 30-59 ml/min (HCC) Creatinine 1.6 with GFR in the 40s Appears near baseline Monitor      Advance Care Planning:   Code Status: Full Code   Consults: None   Family Communication: No family at the bedside   Severity of Illness: The appropriate patient status for this patient is INPATIENT. Inpatient status is judged to be reasonable and necessary in order to provide the required intensity of service to ensure the patient's safety. The patient's presenting symptoms, physical exam findings, and initial radiographic and laboratory data in the context of their chronic comorbidities is felt to place them at high risk for further clinical deterioration. Furthermore, it is not anticipated that the patient will be medically stable for discharge from the hospital within 2 midnights of admission.   * I certify that at the point of admission it is my clinical judgment that the patient will require inpatient hospital care spanning beyond 2 midnights from the point of admission due to high intensity of service, high risk for further deterioration and high frequency of surveillance required.*  Author: Floydene Flock, MD 09/20/2023 1:21 PM  For on call review [email or web address].

## 2023-09-20 NOTE — Progress Notes (Signed)
[facility]link following for sepsis protocol. 

## 2023-09-20 NOTE — Progress Notes (Signed)
CODE SEPSIS - PHARMACY COMMUNICATION  **Broad Spectrum Antibiotics should be administered within 1 hour of Sepsis diagnosis**  Time Code Sepsis Called/Page Received: 10/25 @ 0831  Antibiotics Ordered: Vancomycin and ceftriaxone  Time of 1st antibiotic administration: 0849  Additional action taken by pharmacy: None  If necessary, Name of Provider/Nurse Contacted: None    Merryl Hacker ,PharmD Clinical Pharmacist  09/20/2023  8:48 AM

## 2023-09-20 NOTE — ED Notes (Signed)
Pt c/o pain to IV site; IV d/c'd at pt request, cannula intact & dressing applied; attempted restart to rt FA with 22GA without success; pt st she has hx of needing u/s IV; charge nurse notified

## 2023-09-20 NOTE — Progress Notes (Signed)
PHARMACY -  BRIEF ANTIBIOTIC NOTE   Pharmacy has received consult(s) for vancomycin from an ED provider.  The patient's profile has been reviewed for ht/wt/allergies/indication/available labs.    One time order(s) placed for vancomycin 2 g IV x 1  Further antibiotics/pharmacy consults should be ordered by admitting physician if indicated.                       Thank you, Merryl Hacker, PharmD Clinical Pharmacist 09/20/2023  8:49 AM

## 2023-09-20 NOTE — Discharge Instructions (Signed)

## 2023-09-20 NOTE — Consult Note (Signed)
Pharmacy Antibiotic Note  Theresa Malone is a 42 y.o. female admitted on 09/20/2023 with  worsening cellulitis / abscess of right lower leg . In review of chart, appears patient failed outpatient therapy with clindamycin. Wound culture from 09/13/23 showed GBS. Pharmacy has been consulted for vancomycin dosing.  Plan:  Vancomycin 2 g IV loading dose followed by maintenance regimen of vancomycin 750 mg IV q24h --Calculated AUC: 419, Cmin: 11.7 --Daily Scr per protocol --Levels at steady state or as clinically indicated  Height: 5\' 1"  (154.9 cm) Weight: 105.2 kg (232 lb) IBW/kg (Calculated) : 47.8  Temp (24hrs), Avg:98.3 F (36.8 C), Min:98.1 F (36.7 C), Max:98.6 F (37 C)  Recent Labs  Lab 09/19/23 2353 09/20/23 0504  WBC 11.4*  --   CREATININE 1.60*  --   LATICACIDVEN  --  1.9    Estimated Creatinine Clearance: 52.8 mL/min (A) (by C-G formula based on SCr of 1.6 mg/dL (H)).    Allergies  Allergen Reactions   Sulfa Antibiotics Hives   Lisinopril Cough    cough    Antimicrobials this admission: Vancomycin 10/25 >>  Ceftriaxone 10/25 >>  Metronidazole 10/25 >>   Dose adjustments this admission: N/A  Microbiology results: 10/25 BCx: pending  Thank you for allowing pharmacy to be a part of this patient's care.  [name] 09/20/2023 1:12 PM

## 2023-09-20 NOTE — ED Notes (Signed)
Pt called out reporting redness and itching to face as this RN and Syndi, RN upon receiving report. Pt had 2 bags of vancomycin running, Sydni, RN paused one bag of vancomycin and placed the first bag on a pump at ordered infusion rate. Provider notified.

## 2023-09-20 NOTE — Plan of Care (Signed)

## 2023-09-20 NOTE — ED Notes (Signed)
Pt c/o increasing pain to rt lower leg; taken to triage for reassessment; pt reports dx with MRSA and rx clindamycin without relief; st symptoms began as small pustules on legs; area to rt lower leg had "popped" and now with ulceration and purulent drainage to area; no redness noted, edges of skin to ulcer peeling; saline soaked 4x4 with kerlex applied and pt assisted to change pants; pt moved to recliner with legs elevated for comfort and add'l orders obtained

## 2023-09-20 NOTE — Progress Notes (Signed)
Initial Nutrition Assessment  DOCUMENTATION CODES:   Morbid obesity  INTERVENTION:   -Liberalize diet to carb modified for wider variety of meal selections -MVI with minerals daily -1 packet Juven BID, each packet provides 95 calories, 2.5 grams of protein (collagen), and 9.8 grams of carbohydrate (3 grams sugar); also contains 7 grams of L-arginine and L-glutamine, 300 mg vitamin C, 15 mg vitamin E, 1.2 mcg vitamin B-12, 9.5 mg zinc, 200 mg calcium, and 1.5 g  Calcium Beta-hydroxy-Beta-methylbutyrate to support wound healing  -Ensure Max po daily, each supplement provides 150 kcal and 30 grams of protein.   -Provided "Plate Method" and "Carbohydrate Counting for People with Diabetes" handout from [facility] Nutrition Care Manual; attached to AVS/ discharge summary -RD referred to 's Nutrition and Diabetes Education Services for further support and reinforcement at discharge   NUTRITION DIAGNOSIS:   Increased nutrient needs related to wound healing as evidenced by estimated needs.  GOAL:   Patient will meet greater than or equal to 90% of their needs  MONITOR:   PO intake, Supplement acceptance  REASON FOR ASSESSMENT:   Consult Assessment of nutrition requirement/status  ASSESSMENT:   Pt with medical history significant of obesity, hidradenitis suppurativa, gout, hypertension, poorly controlled type 2 diabetes, venous stasis changes presenting with right lower extremity cellulitis.  Pt admitted with cellulitis of rt lower extremity.   Reviewed I/O's: +1.7 L x 24 hours  Pt unavailable at time of visit. RD unable to obtain further nutrition-related history or complete nutrition-focused physical exam at this time.     Per H&P, pt has had worsening rt lower extremity wound over the past 1-2 weeks PTA: she was started on clindamycin on 09/13/23 at PCP office. CBGS have ranged from 200-300's at home. Noted wound culture growing strep agalactiae. CT imaging negative for  abscess but concerning for cellulitis.   Pt currently on a heart healthy, carb modified diet. No meal completion data available to assess at this time.   Reviewed wt hx; wt has been stable over the past 7 months. Per RN assessment, pt with moderate pitting edema, which may be masking true weight loss as well as fat and muscle depletions.   Medications reviewed and include lovenox  Lab Results  Component Value Date   HGBA1C 10.3 (A) 06/27/2023   PTA DM medications are SSI insulin aspart and 1000 mg metformin BID. Noted pt with Dexcom CGM  Labs reviewed: Na: 126, CBGS: 153 (inpatient orders for glycemic control are 0-5 units insulin aspart daily at bedtime, 0-9 units inuslin aspart TID with meal,s and 12 units insulin glargine-yfgn daily at bedtime).    Diet Order:   Diet Order             Diet heart healthy/carb modified Room service appropriate? Yes; Fluid consistency: Thin  Diet effective now                   EDUCATION NEEDS:   No education needs have been identified at this time  Skin:  Skin Assessment: Reviewed RN Assessment  Last BM:  Unknown  Height:   Ht Readings from Last 1 Encounters:  09/19/23 5\' 1"  (1.549 m)    Weight:   Wt Readings from Last 1 Encounters:  09/19/23 105.2 kg    Ideal Body Weight:  47.7 kg  BMI:  Body mass index is 43.84 kg/m.  Estimated Nutritional Needs:   Kcal:  1700-1900  Protein:  100-115 grams  Fluid:  > 1.7 L  Levada Schilling, RD, LDN, CDCES Registered Dietitian III Certified Diabetes Care and Education Specialist Please refer to [facility] for RD and/or RD on-call/weekend/after hours pager

## 2023-09-20 NOTE — Assessment & Plan Note (Addendum)
Worsening right lower extremity cellulitis despite treatment with outpatient course of clindamycin Noted October 18 wound culture growing strep agalactiae White count 11.4 CT imaging negative for abscess but is showing signs concerning for cellulitis Will place on IV Rocephin, Flagyl vancomycin for infectious coverage for now Panculture MRSA swab Blood sugar control in setting of type 2 diabetes Follow

## 2023-09-20 NOTE — ED Provider Notes (Signed)
[facility] Provider Note    Event Date/Time   First MD Initiated Contact with Patient 09/20/23 [phone]     (approximate)   History   Cellulitis   HPI  Theresa Malone is a 42 y.o. female past medical history significant for obesity, diabetes, hidradenitis, who presents to the emergency department with pain to her right leg.  States that she started having bumps to her lower legs consistent with small pimples.  States over the past couple of days she had worsening redness and pain to the right ankle.  States that she has been taking doxycycline for the past 3 days and has been compliant with that medication.  Did not take a dose last night or this morning secondary to being in the emergency department.  States that her glucose has been elevated.     Physical Exam   Triage Vital Signs: ED Triage Vitals  Encounter Vitals Group     BP 09/19/23 2352 (!) 175/108     Systolic BP Percentile --      Diastolic BP Percentile --      Pulse Rate 09/19/23 2352 (!) 116     Resp 09/19/23 2352 (!) 21     Temp 09/19/23 2352 98.6 F (37 C)     Temp Source 09/19/23 2352 Oral     SpO2 09/19/23 2352 99 %     Weight 09/19/23 2352 232 lb (105.2 kg)     Height 09/19/23 2352 5\' 1"  (1.549 m)     Head Circumference --      Peak Flow --      Pain Score 09/19/23 2348 10     Pain Loc --      Pain Education --      Exclude from Growth Chart --     Most recent vital signs: Vitals:   09/20/23 1330 09/20/23 1430  BP: (!) 134/98 134/82  Pulse: (!) 104 (!) 108  Resp: 19 16  Temp:    SpO2: 100% 96%    Physical Exam Constitutional:      Appearance: She is well-developed.  HENT:     Head: Atraumatic.  Eyes:     Conjunctiva/sclera: Conjunctivae normal.  Cardiovascular:     Rate and Rhythm: Regular rhythm.  Pulmonary:     Effort: No respiratory distress.  Abdominal:     General: There is no distension.  Musculoskeletal:        General: Normal range of motion.      Cervical back: Normal range of motion.     Right lower leg: Edema present.     Comments: See picture below.  Wound to the left lower extremity with surrounding erythema warmth and induration.  No crepitance.  No fluctuance.  Skin:    General: Skin is warm.  Neurological:     Mental Status: She is alert. Mental status is at baseline.       IMPRESSION / MDM / ASSESSMENT AND PLAN / ED COURSE  I reviewed the triage vital signs and the nursing notes.  Differential diagnosis including cellulitis, DKA, hyperglycemia, abscess  Sinus tachycardia while on cardiac telemetry.  RADIOLOGY I independently reviewed imaging, my interpretation of imaging: CT scan of the right leg with no obvious abscess.  Read as cellulitis with no gas.  LABS (all labs ordered are listed, but only abnormal results are displayed) Labs interpreted as -    Labs Reviewed  CBC WITH DIFFERENTIAL/PLATELET - Abnormal; Notable for the following components:  Result Value   WBC 11.4 (*)    RBC 3.63 (*)    Hemoglobin 9.4 (*)    HCT 30.0 (*)    MCH 25.9 (*)    Platelets 479 (*)    Neutro Abs 8.2 (*)    Abs Immature Granulocytes 0.08 (*)    All other components within normal limits  BASIC METABOLIC PANEL - Abnormal; Notable for the following components:   Sodium 126 (*)    Chloride 93 (*)    Glucose, Bld 472 (*)    BUN 25 (*)    Creatinine, Ser 1.60 (*)    Calcium 8.8 (*)    GFR, Estimated 42 (*)    All other components within normal limits  SEDIMENTATION RATE - Abnormal; Notable for the following components:   Sed Rate 108 (*)    All other components within normal limits  CULTURE, BLOOD (ROUTINE X 2)  CULTURE, BLOOD (ROUTINE X 2)  MRSA NEXT GEN BY PCR, NASAL  AEROBIC CULTURE W GRAM STAIN (SUPERFICIAL SPECIMEN)  LACTIC ACID, PLASMA  HCG, QUANTITATIVE, PREGNANCY  HIV ANTIBODY (ROUTINE TESTING W REFLEX)  C-REACTIVE PROTEIN  PREALBUMIN  CBG MONITORING, ED     MDM    Leukocytosis.   Pseudohyponatremia of 126.  Hyperglycemia but does not meet criteria for DKA.  Normal lactic acid.  Patient given IV fluids.  Started on antibiotics to cover for purulent cellulitis.  Consulted hospitalist for admission for complicated cellulitis that has failed outpatient treatment.  Clinical picture is not consistent with a necrotizing soft tissue infection   PROCEDURES:  Critical Care performed: yes  .Critical Care  Performed by: Corena Herter, MD Authorized by: Corena Herter, MD   Critical care provider statement:    Critical care time (minutes):  45   Critical care time was exclusive of:  Separately billable procedures and treating other patients   Critical care was necessary to treat or prevent imminent or life-threatening deterioration of the following conditions:  Sepsis and endocrine crisis   Critical care was time spent personally by me on the following activities:  Development of treatment plan with patient or surrogate, discussions with consultants, evaluation of patient's response to treatment, examination of patient, ordering and review of laboratory studies, ordering and review of radiographic studies, ordering and performing treatments and interventions, pulse oximetry, re-evaluation of patient's condition and review of old charts   Patient's presentation is most consistent with acute presentation with potential threat to life or bodily function.   MEDICATIONS ORDERED IN ED: Medications  sodium chloride flush (NS) 0.9 % injection 10 mL (10 mLs Intravenous Given 09/20/23 0900)  ondansetron (ZOFRAN) tablet 4 mg (has no administration in time range)    Or  ondansetron (ZOFRAN) injection 4 mg (has no administration in time range)  cefTRIAXone (ROCEPHIN) 2 g in sodium chloride 0.9 % 100 mL IVPB (has no administration in time range)    And  metroNIDAZOLE (FLAGYL) IVPB 500 mg (has no administration in time range)  insulin aspart (novoLOG) injection 0-9 Units (has no  administration in time range)  insulin aspart (novoLOG) injection 0-5 Units (has no administration in time range)  insulin glargine-yfgn (SEMGLEE) injection 12 Units (has no administration in time range)  enoxaparin (LOVENOX) injection 50 mg (has no administration in time range)  nutrition supplement (JUVEN) (JUVEN) powder packet 1 packet (1 packet Oral Given 09/20/23 1616)  multivitamin with minerals tablet 1 tablet (1 tablet Oral Given 09/20/23 1616)  feeding supplement (ENSURE ENLIVE / ENSURE PLUS) liquid  237 mL (has no administration in time range)  vancomycin (VANCOREADY) IVPB 750 mg/150 mL (has no administration in time range)  acetaminophen (TYLENOL) tablet 650 mg (650 mg Oral Incomplete 09/20/23 1435)  hydrOXYzine (ATARAX) tablet 25 mg (has no administration in time range)  oxyCODONE-acetaminophen (PERCOCET/ROXICET) 5-325 MG per tablet 1 tablet (1 tablet Oral Given 09/20/23 1227)  sodium chloride 0.9 % bolus 1,000 mL (0 mLs Intravenous Stopped 09/20/23 0801)  morphine (PF) 4 MG/ML injection 4 mg (4 mg Intravenous Given 09/20/23 0518)  sodium chloride 0.9 % bolus 1,000 mL (0 mLs Intravenous Stopped 09/20/23 1129)  cefTRIAXone (ROCEPHIN) 2 g in sodium chloride 0.9 % 100 mL IVPB (0 g Intravenous Stopped 09/20/23 0954)  morphine (PF) 4 MG/ML injection 4 mg (4 mg Intravenous Given 09/20/23 0844)  vancomycin (VANCOCIN) IVPB 1000 mg/200 mL premix (0 mg Intravenous Stopped 09/20/23 1209)    Followed by  vancomycin (VANCOCIN) IVPB 1000 mg/200 mL premix (0 mg Intravenous Stopped 09/20/23 1311)  iohexol (OMNIPAQUE) 300 MG/ML solution 70 mL (70 mLs Intravenous Contrast Given 09/20/23 0919)  sodium chloride 0.9 % bolus 500 mL (0 mLs Intravenous Stopped 09/20/23 1211)  diphenhydrAMINE (BENADRYL) injection 25 mg (25 mg Intravenous Given 09/20/23 1146)  diphenhydrAMINE (BENADRYL) injection 25 mg (25 mg Intravenous Given 09/20/23 1616)    FINAL CLINICAL IMPRESSION(S) / ED DIAGNOSES   Final  diagnoses:  Cellulitis of right lower extremity     Rx / DC Orders   ED Discharge Orders          Ordered    Amb ref to Medical Nutrition Therapy-MNT        09/20/23 1350             Note:  This document was prepared using Dragon voice recognition software and may include unintentional dictation errors.   Corena Herter, MD 09/20/23 1630

## 2023-09-20 NOTE — Inpatient Diabetes Management (Signed)
Inpatient Diabetes Program Recommendations  AACE/ADA: New Consensus Statement on Inpatient Glycemic Control (2015)  Target Ranges:  Prepandial:   less than 140 mg/dL      Peak postprandial:   less than 180 mg/dL (1-2 hours)      Critically ill patients:  140 - 180 mg/dL   Lab Results  Component Value Date   GLUCAP 153 (H) 10/24/2022   HGBA1C 10.3 (A) 06/27/2023    Review of Glycemic Control  Latest Reference Range & Units 10/23/22 11:07 10/23/22 17:53 10/23/22 19:52 10/24/22 08:22 10/24/22 11:56  Glucose-Capillary 70 - 99 mg/dL 161 (H) 096 (H) 045 (H) 181 (H) 153 (H)  (H): Data is abnormally high Diabetes history: Type 2 DM Outpatient Diabetes medications: Trulicity .75 mg qwk, Novolog 0-9 units TID, Metformin 1000 mg BID Current orders for Inpatient glycemic control: none  Inpatient Diabetes Program Recommendations:    Consider: - Semglee 15 units every day - Novolog 0-15 units TID & HS  Spoke with patient regarding outpatient diabetes management. Patient reports difficulty obtaining appointment with new PCP at [facility]. Verifies medications above and denies missing doses.  Reviewed patient's current A1c of 10.3%. Explained what a A1c is and what it measures. Also reviewed goal A1c with patient, importance of good glucose control @ home, and blood sugar goals. Reviewed patho of DM, need for insulin, differences between short acting vs long acting, survival skills, interventions, vascular changes and commorbidities.  Patient has a meter and testing supplies at home. Reviewed CGM with patient and she is interested as discharge nears. Will follow up on 10/28.  With difficulty getting appointment will place [facility] consult.  Admits to drinking sodas even though she knows other alternatives. Reviewed and provided encouragement.   Thanks, Lujean Rave, MSN, RNC-OB Diabetes Coordinator [phone] (8a-5p)    Thanks, Lujean Rave, MSN, RNC-OB Diabetes  Coordinator [phone] (8a-5p)

## 2023-09-20 NOTE — Progress Notes (Signed)
PHARMACIST - PHYSICIAN COMMUNICATION  CONCERNING:  Enoxaparin (Lovenox) for DVT Prophylaxis    RECOMMENDATION: Patient was prescribed enoxaprin 40mg  q24 hours for VTE prophylaxis.   Filed Weights   09/19/23 2352  Weight: 105.2 kg (232 lb)    Body mass index is 43.84 kg/m.  Estimated Creatinine Clearance: 52.8 mL/min (A) (by C-G formula based on SCr of 1.6 mg/dL (H)).   Based on [facility] policy patient is candidate for enoxaparin 0.5mg /kg TBW SQ every 24 hours based on BMI being >30.  DESCRIPTION: Pharmacy has adjusted enoxaparin dose per [facility] policy.  Patient is now receiving enoxaparin 50 mg every 24 hours    Gardner Candle, PharmD, BCPS Clinical Pharmacist 09/20/2023 1:14 PM

## 2023-09-20 NOTE — ED Notes (Addendum)
This RN took over care of this patient at this time. When introducing myself to this patient, patient complaining of itchiness in throat and ears. This RN noted both vancomycin drips to be running by gravity at the same time. This RN stopped one bag of vanc from running and placed the other bag on the pump to be infused at 200 mL/hr. MD aware of patient symptoms. VSS at this time.

## 2023-09-21 DIAGNOSIS — L03115 Cellulitis of right lower limb: Secondary | ICD-10-CM | POA: Diagnosis not present

## 2023-09-21 LAB — COMPREHENSIVE METABOLIC PANEL
ALT: 14 U/L (ref 0–44)
AST: 14 U/L — ABNORMAL LOW (ref 15–41)
Albumin: 3.1 g/dL — ABNORMAL LOW (ref 3.5–5.0)
Alkaline Phosphatase: 146 U/L — ABNORMAL HIGH (ref 38–126)
Anion gap: 9 (ref 5–15)
BUN: 23 mg/dL — ABNORMAL HIGH (ref 6–20)
CO2: 26 mmol/L (ref 22–32)
Calcium: 8.9 mg/dL (ref 8.9–10.3)
Chloride: 95 mmol/L — ABNORMAL LOW (ref 98–111)
Creatinine, Ser: 1.33 mg/dL — ABNORMAL HIGH (ref 0.44–1.00)
GFR, Estimated: 52 mL/min — ABNORMAL LOW (ref >60)
Glucose, Bld: 282 mg/dL — ABNORMAL HIGH (ref 70–99)
Potassium: 4.4 mmol/L (ref 3.5–5.1)
Sodium: 130 mmol/L — ABNORMAL LOW (ref 135–145)
Total Bilirubin: 0.6 mg/dL (ref 0.3–1.2)
Total Protein: 7.9 g/dL (ref 6.5–8.1)

## 2023-09-21 LAB — CBC
HCT: 27.6 % — ABNORMAL LOW (ref 36.0–46.0)
Hemoglobin: 8.6 g/dL — ABNORMAL LOW (ref 12.0–15.0)
MCH: 25.7 pg — ABNORMAL LOW (ref 26.0–34.0)
MCHC: 31.2 g/dL (ref 30.0–36.0)
MCV: 82.4 fL (ref 80.0–100.0)
Platelets: 384 10*3/uL (ref 150–400)
RBC: 3.35 MIL/uL — ABNORMAL LOW (ref 3.87–5.11)
RDW: 14.8 % (ref 11.5–15.5)
WBC: 11.1 10*3/uL — ABNORMAL HIGH (ref 4.0–10.5)
nRBC: 0 % (ref 0.0–0.2)

## 2023-09-21 LAB — IRON AND TIBC
Iron: 26 ug/dL — ABNORMAL LOW (ref 28–170)
Saturation Ratios: 10 % — ABNORMAL LOW (ref 10.4–31.8)
TIBC: 255 ug/dL (ref 250–450)
UIBC: 229 ug/dL

## 2023-09-21 LAB — FOLATE: Folate: 6.9 ng/mL (ref >5.9)

## 2023-09-21 LAB — WOUND CULTURE
MICRO NUMBER:: 15618252
SPECIMEN QUALITY:: ADEQUATE

## 2023-09-21 LAB — GLUCOSE, CAPILLARY
Glucose-Capillary: 306 mg/dL — ABNORMAL HIGH (ref 70–99)
Glucose-Capillary: 175 mg/dL — ABNORMAL HIGH (ref 70–99)
Glucose-Capillary: 319 mg/dL — ABNORMAL HIGH (ref 70–99)
Glucose-Capillary: 178 mg/dL — ABNORMAL HIGH (ref 70–99)

## 2023-09-21 LAB — HEMOGLOBIN A1C
Hgb A1c MFr Bld: 11.4 % — ABNORMAL HIGH (ref 4.8–5.6)
Mean Plasma Glucose: 280.48 mg/dL

## 2023-09-21 MED ORDER — OXYCODONE-ACETAMINOPHEN 5-325 MG PO TABS
1.0000 | ORAL_TABLET | Freq: Four times a day (QID) | ORAL | Status: DC | PRN
  Administered 2023-09-21: 1 via ORAL
  Filled 2023-09-21: qty 1

## 2023-09-21 MED ORDER — ACETAMINOPHEN 325 MG PO TABS
650.0000 mg | ORAL_TABLET | Freq: Four times a day (QID) | ORAL | Status: DC | PRN
  Administered 2023-09-22 – 2023-09-25 (×4): 650 mg via ORAL
  Filled 2023-09-21 (×5): qty 2

## 2023-09-21 MED ORDER — HYDROCERIN EX CREA
TOPICAL_CREAM | Freq: Two times a day (BID) | CUTANEOUS | Status: DC
  Filled 2023-09-21: qty 113

## 2023-09-21 MED ORDER — MELATONIN 5 MG PO TABS
5.0000 mg | ORAL_TABLET | Freq: Once | ORAL | Status: AC
  Administered 2023-09-21: 5 mg via ORAL
  Filled 2023-09-21: qty 1

## 2023-09-21 MED ORDER — POLYSACCHARIDE IRON COMPLEX 150 MG PO CAPS
150.0000 mg | ORAL_CAPSULE | Freq: Every day | ORAL | Status: DC
  Administered 2023-09-21 – 2023-09-25 (×4): 150 mg via ORAL
  Filled 2023-09-21 (×5): qty 1

## 2023-09-21 MED ORDER — FOLIC ACID 1 MG PO TABS
1.0000 mg | ORAL_TABLET | Freq: Every day | ORAL | Status: DC
  Administered 2023-09-21 – 2023-09-25 (×4): 1 mg via ORAL
  Filled 2023-09-21 (×4): qty 1

## 2023-09-21 MED ORDER — FAMOTIDINE IN NACL 20-0.9 MG/50ML-% IV SOLN
20.0000 mg | Freq: Two times a day (BID) | INTRAVENOUS | Status: AC
  Administered 2023-09-21 (×2): 20 mg via INTRAVENOUS
  Filled 2023-09-21 (×2): qty 50

## 2023-09-21 MED ORDER — OXYCODONE-ACETAMINOPHEN 5-325 MG PO TABS
1.0000 | ORAL_TABLET | Freq: Four times a day (QID) | ORAL | Status: DC | PRN
  Administered 2023-09-21 (×2): 1 via ORAL
  Filled 2023-09-21 (×3): qty 1

## 2023-09-21 MED ORDER — HYDROMORPHONE HCL 1 MG/ML IJ SOLN
1.0000 mg | Freq: Once | INTRAMUSCULAR | Status: AC
  Administered 2023-09-21: 1 mg via INTRAVENOUS
  Filled 2023-09-21: qty 1

## 2023-09-21 MED ORDER — DIPHENHYDRAMINE HCL 25 MG PO CAPS
25.0000 mg | ORAL_CAPSULE | Freq: Four times a day (QID) | ORAL | Status: DC | PRN
  Administered 2023-09-21 – 2023-09-23 (×2): 25 mg via ORAL
  Filled 2023-09-21 (×2): qty 1

## 2023-09-21 MED ORDER — GABAPENTIN 300 MG PO CAPS
600.0000 mg | ORAL_CAPSULE | Freq: Two times a day (BID) | ORAL | Status: DC
  Administered 2023-09-21 – 2023-09-25 (×8): 600 mg via ORAL
  Filled 2023-09-21 (×8): qty 2

## 2023-09-21 MED ORDER — CYCLOBENZAPRINE HCL 10 MG PO TABS
5.0000 mg | ORAL_TABLET | Freq: Three times a day (TID) | ORAL | Status: DC | PRN
  Administered 2023-09-21 – 2023-09-25 (×5): 5 mg via ORAL
  Filled 2023-09-21 (×6): qty 1

## 2023-09-21 MED ORDER — VANCOMYCIN HCL IN DEXTROSE 1-5 GM/200ML-% IV SOLN
1000.0000 mg | INTRAVENOUS | Status: DC
  Administered 2023-09-21 – 2023-09-23 (×3): 1000 mg via INTRAVENOUS
  Filled 2023-09-21 (×4): qty 200

## 2023-09-21 MED ORDER — ESCITALOPRAM OXALATE 10 MG PO TABS
40.0000 mg | ORAL_TABLET | Freq: Every day | ORAL | Status: DC
  Administered 2023-09-21 – 2023-09-25 (×4): 40 mg via ORAL
  Filled 2023-09-21 (×4): qty 4

## 2023-09-21 MED ORDER — OXYCODONE-ACETAMINOPHEN 5-325 MG PO TABS
1.0000 | ORAL_TABLET | ORAL | Status: DC | PRN
  Administered 2023-09-21 – 2023-09-23 (×9): 1 via ORAL
  Filled 2023-09-21 (×9): qty 1

## 2023-09-21 MED ORDER — METOPROLOL TARTRATE 50 MG PO TABS
50.0000 mg | ORAL_TABLET | Freq: Two times a day (BID) | ORAL | Status: DC
  Administered 2023-09-21 – 2023-09-25 (×9): 50 mg via ORAL
  Filled 2023-09-21 (×9): qty 1

## 2023-09-21 MED ORDER — LOSARTAN POTASSIUM 50 MG PO TABS
50.0000 mg | ORAL_TABLET | Freq: Every day | ORAL | Status: DC
  Administered 2023-09-21 – 2023-09-22 (×2): 50 mg via ORAL
  Filled 2023-09-21 (×2): qty 1

## 2023-09-21 NOTE — Plan of Care (Signed)
  Problem: Education: Goal: Knowledge of General Education information will improve Description: Including pain rating scale, medication(s)/side effects and non-pharmacologic comfort measures Outcome: Progressing   Problem: Health Behavior/Discharge Planning: Goal: Ability to manage health-related needs will improve Outcome: Progressing   Problem: Clinical Measurements: Goal: Ability to maintain clinical measurements within normal limits will improve Outcome: Progressing   Problem: Activity: Goal: Risk for activity intolerance will decrease Outcome: Progressing   Problem: Coping: Goal: Level of anxiety will decrease Outcome: Progressing   Problem: Elimination: Goal: Will not experience complications related to bowel motility Outcome: Progressing   Problem: Pain Management: Goal: General experience of comfort will improve Outcome: Progressing   Problem: Safety: Goal: Ability to remain free from injury will improve Outcome: Progressing   Problem: Education: Goal: Ability to describe self-care measures that may prevent or decrease complications (Diabetes Survival Skills Education) will improve Outcome: Progressing   Problem: Coping: Goal: Ability to adjust to condition or change in health will improve Outcome: Progressing   Problem: Fluid Volume: Goal: Ability to maintain a balanced intake and output will improve Outcome: Progressing   Problem: Skin Integrity: Goal: Risk for impaired skin integrity will decrease Outcome: Progressing   Problem: Tissue Perfusion: Goal: Adequacy of tissue perfusion will improve Outcome: Progressing

## 2023-09-21 NOTE — Plan of Care (Signed)
  Problem: Education: Goal: Knowledge of General Education information will improve Description: Including pain rating scale, medication(s)/side effects and non-pharmacologic comfort measures Outcome: Progressing   Problem: Activity: Goal: Risk for activity intolerance will decrease Outcome: Progressing   Problem: Nutrition: Goal: Adequate nutrition will be maintained Outcome: Progressing   Problem: Coping: Goal: Level of anxiety will decrease Outcome: Progressing   Problem: Safety: Goal: Ability to remain free from injury will improve Outcome: Progressing   Problem: Coping: Goal: Ability to adjust to condition or change in health will improve Outcome: Progressing   Problem: Fluid Volume: Goal: Ability to maintain a balanced intake and output will improve Outcome: Progressing

## 2023-09-21 NOTE — Progress Notes (Signed)
Pt is again complaining of pain 6/10 with nothing available to give. MD made aware. No change in medications made.

## 2023-09-21 NOTE — Consult Note (Signed)
Pharmacy Antibiotic Note  Theresa Malone is a 42 y.o. female admitted on 09/20/2023 with  worsening cellulitis / abscess of right lower leg . In review of chart, appears patient failed outpatient therapy with clindamycin. Wound culture from 09/13/23 showed GBS. Pharmacy has been consulted for vancomycin dosing.  Plan: Vancomycin 2 g IV loading dose followed by maintenance regimen of vancomycin 750 mg IV q24h. Will adjust to vancomycin 1000 mg q24H. Predicted AUC of 475. Goal AUC of 400-600. Scr 1.33 Vd 0.5. IBW. Plan to order vancomycin level after 4th or 5th doses.    Height: 5\' 1"  (154.9 cm) Weight: 105.2 kg (232 lb) IBW/kg (Calculated) : 47.8  Temp (24hrs), Avg:99 F (37.2 C), Min:98 F (36.7 C), Max:100.1 F (37.8 C)  Recent Labs  Lab 09/19/23 2353 09/20/23 0504 09/21/23 0426  WBC 11.4*  --  11.1*  CREATININE 1.60*  --  1.33*  LATICACIDVEN  --  1.9  --     Estimated Creatinine Clearance: 63.5 mL/min (A) (by C-G formula based on SCr of 1.33 mg/dL (H)).    Allergies  Allergen Reactions   Sulfa Antibiotics Hives   Lisinopril Cough    cough    Antimicrobials this admission: Vancomycin 10/25 >>  Ceftriaxone 10/25 >>  Metronidazole 10/25 >>   Dose adjustments this admission: N/A  Microbiology results: 10/25 BCx: pending  Thank you for allowing pharmacy to be a part of this patient's care.  Ronnald Ramp, PharmD, BCPS 09/21/2023 9:24 AM

## 2023-09-21 NOTE — TOC Progression Note (Signed)
Transition of Care [facility]) - Progression Note    Patient Details  Name: Theresa Malone MRN: [id number] Date of Birth: 1981/03/11  Transition of Care [facility]) CM/SW Contact  Bing Quarry, RN Phone Number: 09/21/2023, 11:20 AM  Clinical Narrative:  10/26: [facility] consult acknowledged for patient having issues obtaining an appointment with PCP at [facility]. PCP listed at [facility] [phone]. TOC to follow up during weekday to try to assist with setting up appointment.   Gabriel Cirri MSN RN CM  Care Management Department.  [place]  [facility] Direct Dial: [phone] Main Office Phone: [phone] Weekends Only          Expected Discharge Plan and Services                                               Social Determinants of Health (SDOH) Interventions SDOH Screenings   Food Insecurity: Food Insecurity Present (04/04/2023)  Housing: Low Risk  (04/04/2023)  Transportation Needs: No Transportation Needs (04/04/2023)  Utilities: Not At Risk (04/04/2023)  Alcohol Screen: Low Risk  (03/28/2023)  Depression (PHQ2-9): Low Risk  (09/13/2023)  Financial Resource Strain: Low Risk  (03/22/2021)  Physical Activity: Insufficiently Active (04/17/2021)  Social Connections: Socially Integrated (04/17/2021)  Stress: No Stress Concern Present (04/17/2021)  Recent Concern: Stress - Stress Concern Present (03/22/2021)  Tobacco Use: Low Risk  (09/20/2023)    Readmission Risk Interventions    10/22/2022   11:43 AM 09/13/2022    4:23 PM  Readmission Risk Prevention Plan  Transportation Screening Complete Complete  PCP or Specialist Appt within 5-7 Days  Complete  PCP or Specialist Appt within 3-5 Days Complete   Home Care Screening  Complete  Medication Review (RN CM)  Complete  HRI or Home Care Consult Not Complete   HRI or Home Care Consult comments NA- patient independent   Social Work Consult for Recovery Care Planning/Counseling Not Complete   SW consult not completed  comments NA   Palliative Care Screening Not Applicable   Medication Review Oceanographer) Referral to Pharmacy

## 2023-09-21 NOTE — Progress Notes (Signed)
[facility] Progress Note  Patient: Theresa Malone    AVW:[id number]  DOA: 09/20/2023     Date of Service: the patient was seen and examined on 09/21/2023  Chief Complaint  Patient presents with   Cellulitis   Brief hospital course: Theresa Malone is a 42 y.o. female with medical history significant of obesity, hidradenitis suppurativa, gout, hypertension, poorly controlled type 2 diabetes, venous stasis changes presenting with right lower extremity cellulitis.  Patient reports worsening right extremity wound over the past 1 to 2 weeks.  Was evaluated October 18 at primary care office.  Was placed on course of clindamycin for treatment.  Wound culture grew out strep agalactiae.  Noted prior history of MRSA infection per report.  Patient reports worsening symptoms despite treatment.  Blood sugars to 200s to 300s at home.  Positive generalized malaise.  No chest pain or shortness of breath.  No nausea or vomiting.  No reported alcohol or tobacco use. Presented to the ER afebrile, heart rate in 100s, BP stable.  Satting well on room air.  White count 11.4, hemoglobin 9.4, platelets 479, lactate 1.9, creatinine 1.6, glucose 472.  CT imaging with noted cutaneous wound at the mid shin with surrounding cellulitis.  No definitive abscess.   Assessment and Plan: Cellulitis of right lower extremity Multiple pustules on bilateral lower extremity  small abscess under left breast Worsening right lower extremity cellulitis despite treatment with outpatient course of clindamycin Noted October 18 wound culture growing strep agalactiae White count 11.4 CT imaging negative for abscess but is showing signs concerning for cellulitis Patient was started on IV Rocephin, Flagyl vancomycin for broad-spectrum coverage Pharmacy consulted for dosing and trough monitoring of vancomycin Follow panculture MRSA swab Blood sugar control in setting of type 2 diabetes Continue as needed medication for pain  control S/p morphine IV, developed generalized itching, use Atarax and Benadryl as needed, Pepcid 20 mg IV x 2 doses order placed    Type II diabetes mellitus with renal manifestations Blood sugar in 400s without overt acidosis Will place on long-acting insulin sliding scale A1c Monitor   Hypertension associated with diabetes Continue metoprolol 50 mg p.o. twice daily, losartan 50 mg p.o. daily Monitor BP and titrate medication accordingly    CKD (chronic kidney disease) stage 3, GFR 30-59 ml/min  Creatinine 1.6 with GFR in the 40s Appears near baseline Monitor  Pseudohyponatremia due to hyperglycemia Monitor sodium level daily  Iron deficiency Anemia, transferrin saturation 10% Started oral iron supplement Folic acid level 6.9, at lower end, started folic acid 1 mg p.o. daily to prevent deficiency   Body mass index is 43.84 kg/m.  Nutrition Problem: Increased nutrient needs Etiology: wound healing Interventions: Interventions: MVI, Liberalize Diet, Juven, Premier Protein   Diet: Diabetic diet DVT Prophylaxis: Subcutaneous Lovenox   Advance goals of care discussion: Full code  Family Communication: family was not present at bedside, at the time of interview.  The pt provided permission to discuss medical plan with the family. Opportunity was given to ask question and all questions were answered satisfactorily.   Disposition:  Pt is from Home, admitted with right lower extremity cellulitis, multiple pustules and small abscess under left breast, still on IV antibiotics, which precludes a safe discharge. Discharge to home, when stable, may need few days to improve.  Subjective: No significant events overnight, patient is complaining of severe pain in the right lower extremity at the site of cellulitis.  Patient is itching all over due to dry skin  and received morphine earlier in the morning.  Patient says that she is getting multiple pustules in the lower extremity on the  back and under her left breast.   Physical Exam: General: NAD, lying comfortably Appear in no distress, affect appropriate Eyes: PERRLA ENT: Oral Mucosa Clear, moist  Neck: no JVD,  Cardiovascular: S1 and S2 Present, no Murmur,  Respiratory: good respiratory effort, Bilateral Air entry equal and Decreased, no Crackles, no wheezes Abdomen: Bowel Sound present, Soft and no tenderness,  Skin: Multiple lower extremity pustules, small abscess under left breast, multiple macules and healing scars from prior cutaneous pustules Extremities: RLE mid shin cellulitis, mild tenderness, multiple small pustules in the lower extremity, no significant edema, no calf tenderness Neurologic: without any new focal findings Gait not checked due to patient safety concerns  Vitals:   09/20/23 2012 09/21/23 0415 09/21/23 0758 09/21/23 0803  BP: (!) 153/80 (!) 165/94 (!) 149/79   Pulse: 95  (!) 117 98  Resp: 18 18 20    Temp: 98 F (36.7 C) 99 F (37.2 C) 100.1 F (37.8 C)   TempSrc:   Oral   SpO2: 100% 97% 99%   Weight:      Height:       No intake or output data in the 24 hours ending 09/21/23 1224 Filed Weights   09/19/23 2352  Weight: 105.2 kg    Data Reviewed: I have personally reviewed and interpreted daily labs, tele strips, imagings as discussed above. I reviewed all nursing notes, pharmacy notes, vitals, pertinent old records I have discussed plan of care as described above with RN and patient/family.  CBC: Recent Labs  Lab 09/19/23 2353 09/21/23 0426  WBC 11.4* 11.1*  NEUTROABS 8.2*  --   HGB 9.4* 8.6*  HCT 30.0* 27.6*  MCV 82.6 82.4  PLT 479* 384   Basic Metabolic Panel: Recent Labs  Lab 09/19/23 2353 09/21/23 0426  NA 126* 130*  K 5.0 4.4  CL 93* 95*  CO2 23 26  GLUCOSE 472* 282*  BUN 25* 23*  CREATININE 1.60* 1.33*  CALCIUM 8.8* 8.9    Studies: No results found.  Scheduled Meds:  enoxaparin (LOVENOX) injection  50 mg Subcutaneous Q24H   escitalopram  40 mg  Oral Daily   feeding supplement  237 mL Oral QHS   gabapentin  600 mg Oral BID   hydrocerin   Topical BID   insulin aspart  0-15 Units Subcutaneous TID WC   insulin aspart  0-5 Units Subcutaneous QHS   insulin aspart  6 Units Subcutaneous TID WC   insulin glargine-yfgn  12 Units Subcutaneous QHS   losartan  50 mg Oral Daily   metoprolol tartrate  50 mg Oral BID   multivitamin with minerals  1 tablet Oral Daily   nutrition supplement (JUVEN)  1 packet Oral BID BM   sodium chloride flush  10 mL Intravenous Q12H   Continuous Infusions:  cefTRIAXone (ROCEPHIN)  IV     And   metronidazole 500 mg (09/21/23 0644)   famotidine (PEPCID) IV 20 mg (09/21/23 1039)   vancomycin 1,000 mg (09/21/23 1121)   PRN Meds: acetaminophen, cyclobenzaprine, diphenhydrAMINE, hydrOXYzine, ondansetron **OR** ondansetron (ZOFRAN) IV, oxyCODONE-acetaminophen  Time spent: 35 minutes  Author: Gillis Santa. MD [facility] 09/21/2023 12:25 PM  To reach On-call, see care teams to locate the attending and reach out to them via [email or web address]. If 7PM-7AM, please contact night-coverage If you still have difficulty reaching the attending provider, please page the [facility] (  Director on Call) for [facility] on [place] for assistance.

## 2023-09-22 ENCOUNTER — Inpatient Hospital Stay: Payer: MEDICAID

## 2023-09-22 DIAGNOSIS — L02415 Cutaneous abscess of right lower limb: Secondary | ICD-10-CM

## 2023-09-22 DIAGNOSIS — L03115 Cellulitis of right lower limb: Secondary | ICD-10-CM | POA: Diagnosis not present

## 2023-09-22 DIAGNOSIS — N611 Abscess of the breast and nipple: Secondary | ICD-10-CM | POA: Diagnosis not present

## 2023-09-22 LAB — VITAMIN B12: Vitamin B-12: 375 pg/mL (ref 180–914)

## 2023-09-22 LAB — CBC
HCT: 27.6 % — ABNORMAL LOW (ref 36.0–46.0)
Hemoglobin: 8.5 g/dL — ABNORMAL LOW (ref 12.0–15.0)
MCH: 25.9 pg — ABNORMAL LOW (ref 26.0–34.0)
MCHC: 30.8 g/dL (ref 30.0–36.0)
MCV: 84.1 fL (ref 80.0–100.0)
Platelets: 403 10*3/uL — ABNORMAL HIGH (ref 150–400)
RBC: 3.28 MIL/uL — ABNORMAL LOW (ref 3.87–5.11)
RDW: 15.2 % (ref 11.5–15.5)
WBC: 10.9 10*3/uL — ABNORMAL HIGH (ref 4.0–10.5)
nRBC: 0 % (ref 0.0–0.2)

## 2023-09-22 LAB — BASIC METABOLIC PANEL
Anion gap: 8 (ref 5–15)
BUN: 27 mg/dL — ABNORMAL HIGH (ref 6–20)
CO2: 23 mmol/L (ref 22–32)
Calcium: 8.6 mg/dL — ABNORMAL LOW (ref 8.9–10.3)
Chloride: 99 mmol/L (ref 98–111)
Creatinine, Ser: 1.62 mg/dL — ABNORMAL HIGH (ref 0.44–1.00)
GFR, Estimated: 41 mL/min — ABNORMAL LOW (ref >60)
Glucose, Bld: 335 mg/dL — ABNORMAL HIGH (ref 70–99)
Potassium: 4.5 mmol/L (ref 3.5–5.1)
Sodium: 130 mmol/L — ABNORMAL LOW (ref 135–145)

## 2023-09-22 LAB — GLUCOSE, CAPILLARY
Glucose-Capillary: 427 mg/dL — ABNORMAL HIGH (ref 70–99)
Glucose-Capillary: 256 mg/dL — ABNORMAL HIGH (ref 70–99)
Glucose-Capillary: 282 mg/dL — ABNORMAL HIGH (ref 70–99)
Glucose-Capillary: 337 mg/dL — ABNORMAL HIGH (ref 70–99)

## 2023-09-22 LAB — PHOSPHORUS: Phosphorus: 4 mg/dL (ref 2.5–4.6)

## 2023-09-22 LAB — MAGNESIUM: Magnesium: 2.1 mg/dL (ref 1.7–2.4)

## 2023-09-22 MED ORDER — INSULIN ASPART 100 UNIT/ML IJ SOLN
20.0000 [IU] | Freq: Once | INTRAMUSCULAR | Status: AC
  Administered 2023-09-22: 20 [IU] via SUBCUTANEOUS
  Filled 2023-09-22: qty 1

## 2023-09-22 MED ORDER — INSULIN GLARGINE-YFGN 100 UNIT/ML ~~LOC~~ SOLN
15.0000 [IU] | Freq: Once | SUBCUTANEOUS | Status: AC
  Administered 2023-09-22: 15 [IU] via SUBCUTANEOUS
  Filled 2023-09-22: qty 0.15

## 2023-09-22 MED ORDER — INSULIN GLARGINE-YFGN 100 UNIT/ML ~~LOC~~ SOLN
30.0000 [IU] | Freq: Every day | SUBCUTANEOUS | Status: DC
  Administered 2023-09-22 – 2023-09-23 (×2): 30 [IU] via SUBCUTANEOUS
  Filled 2023-09-22 (×3): qty 0.3

## 2023-09-22 MED ORDER — VITAMIN B-12 1000 MCG PO TABS
1000.0000 ug | ORAL_TABLET | Freq: Every day | ORAL | Status: DC
  Administered 2023-09-22 – 2023-09-25 (×3): 1000 ug via ORAL
  Filled 2023-09-22 (×3): qty 1

## 2023-09-22 NOTE — Consult Note (Signed)
Patient ID: Theresa Malone, female   DOB: 1981-09-24, 42 y.o.   MRN: [id number]  HPI Theresa Malone is a 42 y.o. female Theresa Malone is a 42 y.o. female with medical history significant of obesity, hidradenitis suppurativa, gout, hypertension, poorly controlled type 2 diabetes, venous stasis changes presenting with right lower extremity cellulitis.   She is admitted 2 days ago for a warning cellulitis in the right lower extremity. Patient reports worsening right extremity wound over the past 1 to 2 weeks.  .  Wound culture grew out strep agalactiae.  Noted prior history of MRSA infection per report.   Now reports a couple of days history of left-sided breast pain.  The breast pain is sharp worsening with pressure moderate to severe in intensity.  Did have an ultrasound that I personally reviewed showing a complex fluid collection in the left breast.   White count 11.4, hemoglobin 9.4, platelets 479, lactate 1.9, creatinine 1.6, glucose 472.  CT imaging with noted cutaneous wound at the mid shin with surrounding cellulitis.  No definitive abscess.  HPI  Past Medical History:  Diagnosis Date   Anxiety    panic attacks   Cellulitis    Depression    Diabetes mellitus    Gout    Hidradenitis suppurativa    Hypertension    Obesity    Venous stasis    Venous stasis dermatitis     Past Surgical History:  Procedure Laterality Date   CESAREAN SECTION     C/S x 2   CESAREAN SECTION N/A 01/24/2015   Procedure: CESAREAN SECTION;  Surgeon: Catalina Antigua, MD;  Location: [facility];  Service: Obstetrics;  Laterality: N/A;   TUBAL LIGATION      Family History  Problem Relation Age of Onset   Diabetes Paternal Grandfather    Cancer Paternal Grandfather        liver   Cancer Paternal Grandmother        liver   Hypertension Father    Diabetes Mother    Hypertension Mother    Diabetes Maternal Uncle    Diabetes Maternal Grandmother    Cancer Maternal Grandfather    Diabetes Daughter         boarderline    Asthma Daughter    Bronchitis Daughter    Bronchitis Son    Bronchitis Daughter     Social History Social History   Tobacco Use   Smoking status: Never   Smokeless tobacco: Never  Vaping Use   Vaping status: Never Used  Substance Use Topics   Alcohol use: Not Currently    Comment: wine occ   Drug use: No    Allergies  Allergen Reactions   Sulfa Antibiotics Hives   Lisinopril Cough    cough    Current Facility-Administered Medications  Medication Dose Route Frequency Provider Last Rate Last Admin   acetaminophen (TYLENOL) tablet 650 mg  650 mg Oral Q6H PRN Gillis Santa, MD       cefTRIAXone (ROCEPHIN) 2 g in sodium chloride 0.9 % 100 mL IVPB  2 g Intravenous Q24H Floydene Flock, MD 200 mL/hr at 09/22/23 0900 2 g at 09/22/23 0900   And   metroNIDAZOLE (FLAGYL) IVPB 500 mg  500 mg Intravenous Q8H Floydene Flock, MD 100 mL/hr at 09/22/23 1423 500 mg at 09/22/23 1423   cyanocobalamin (VITAMIN B12) tablet 1,000 mcg  1,000 mcg Oral Daily Gillis Santa, MD   1,000 mcg at 09/22/23 1019   cyclobenzaprine (  FLEXERIL) tablet 5 mg  5 mg Oral TID PRN Gillis Santa, MD   5 mg at 09/21/23 2100   diphenhydrAMINE (BENADRYL) capsule 25 mg  25 mg Oral Q6H PRN Gillis Santa, MD   25 mg at 09/21/23 0737   enoxaparin (LOVENOX) injection 50 mg  50 mg Subcutaneous Q24H Hallaji, Sheema M, RPH   50 mg at 09/21/23 2100   escitalopram (LEXAPRO) tablet 40 mg  40 mg Oral Daily Gillis Santa, MD   40 mg at 09/22/23 0847   feeding supplement (ENSURE ENLIVE / ENSURE PLUS) liquid 237 mL  237 mL Oral QHS Floydene Flock, MD       folic acid (FOLVITE) tablet 1 mg  1 mg Oral Daily Gillis Santa, MD   1 mg at 09/22/23 0846   gabapentin (NEURONTIN) capsule 600 mg  600 mg Oral BID Gillis Santa, MD   600 mg at 09/22/23 1610   hydrocerin (EUCERIN) cream   Topical BID Gillis Santa, MD   Given at 09/22/23 0856   hydrOXYzine (ATARAX) tablet 25 mg  25 mg Oral Q4H PRN Floydene Flock, MD   25 mg at  09/22/23 1022   insulin aspart (novoLOG) injection 0-15 Units  0-15 Units Subcutaneous TID WC Floydene Flock, MD   8 Units at 09/22/23 1202   insulin aspart (novoLOG) injection 0-5 Units  0-5 Units Subcutaneous QHS Floydene Flock, MD   5 Units at 09/20/23 2238   insulin aspart (novoLOG) injection 6 Units  6 Units Subcutaneous TID WC Floydene Flock, MD   6 Units at 09/22/23 1202   insulin glargine-yfgn (SEMGLEE) injection 30 Units  30 Units Subcutaneous QHS Gillis Santa, MD       iron polysaccharides (NIFEREX) capsule 150 mg  150 mg Oral Daily Gillis Santa, MD   150 mg at 09/22/23 0846   losartan (COZAAR) tablet 50 mg  50 mg Oral Daily Gillis Santa, MD   50 mg at 09/22/23 0847   metoprolol tartrate (LOPRESSOR) tablet 50 mg  50 mg Oral BID Gillis Santa, MD   50 mg at 09/22/23 0846   multivitamin with minerals tablet 1 tablet  1 tablet Oral Daily Floydene Flock, MD   1 tablet at 09/22/23 9604   nutrition supplement (JUVEN) (JUVEN) powder packet 1 packet  1 packet Oral BID BM Floydene Flock, MD   1 packet at 09/22/23 0845   ondansetron (ZOFRAN) tablet 4 mg  4 mg Oral Q6H PRN Floydene Flock, MD       Or   ondansetron [facility]) injection 4 mg  4 mg Intravenous Q6H PRN Floydene Flock, MD       oxyCODONE-acetaminophen (PERCOCET/ROXICET) 5-325 MG per tablet 1 tablet  1 tablet Oral Q4H PRN Manuela Schwartz, NP   1 tablet at 09/22/23 1306   sodium chloride flush (NS) 0.9 % injection 10 mL  10 mL Intravenous Q12H Corena Herter, MD   10 mL at 09/22/23 0856   vancomycin (VANCOCIN) IVPB 1000 mg/200 mL premix  1,000 mg Intravenous Q24H Ronnald Ramp, RPH 200 mL/hr at 09/22/23 0948 1,000 mg at 09/22/23 0948     Review of Systems Full ROS  was asked and was negative except for the information on the HPI  Physical Exam Blood pressure (!) 147/74, pulse 86, temperature 98.2 F (36.8 C), resp. rate 16, height 5\' 1"  (1.549 m), weight 105.2 kg, SpO2 96%. CONSTITUTIONAL: No acute distress.   Chaperone is present. EYES: Pupils are  equal, round,  Sclera are non-icteric. EARS, NOSE, MOUTH AND THROAT: The oral mucosa is pink and moist. Hearing is intact to voice. LYMPH NODES:  Lymph nodes in the neck are normal. RESPIRATORY:  Lungs are clear. There is normal respiratory effort, with equal breath sounds bilaterally, and without pathologic use of accessory muscles. CARDIOVASCULAR: Heart is regular without murmurs, gallops, or rubs. BREAST: She does have several scars from hidradenitis.  There is an area 2 x 2 cm located at 6:00 at the mammary fold that is fluctuant exquisitely tender to palpation consistent with abscess.  There is surrounding erythema and inflammatory response.  There is no evidence of palpable masses on either breast or axillas. GI: The abdomen is  soft, nontender, and nondistended. There are no palpable masses. There is no hepatosplenomegaly. There are normal bowel sounds in all quadrants. GU: Rectal deferred.   MUSCULOSKELETAL: Normal muscle strength and tone. No cyanosis or edema.   SKIN: Turgor is good and there are no pathologic skin lesions or ulcers. NEUROLOGIC: Motor and sensation is grossly normal. Cranial nerves are grossly intact. PSYCH:  Oriented to person, place and time. Affect is normal.  Data Reviewed  I have personally reviewed the patient's imaging, laboratory findings and medical records.    Assessment/Plan   42 year old female with uncontrolled diabetes now presents with a left breast abscess.  Patient is clearly uncomfortable.  Discussed with patient in detail about the need for formal incision and drainage.  The patient had full breakfast just now.  Will have to do it tomorrow.  Procedure discussed with the patient in detail.  Risk, benefits and possible implications including but not limited to: Bleeding, infection, chronic pain, recurrence.  She understands and wished to proceed.  We will arrange for I&D in the OR under general  anesthetic.   Sterling Big, MD FACS General Surgeon 09/22/2023, 2:40 PM

## 2023-09-22 NOTE — Progress Notes (Signed)
[facility] Progress Note  Patient: Theresa Malone    ZOX:[id number]  DOA: 09/20/2023     Date of Service: the patient was seen and examined on 09/22/2023  Chief Complaint  Patient presents with   Cellulitis   Brief hospital course: Theresa Malone is a 42 y.o. female with medical history significant of obesity, hidradenitis suppurativa, gout, hypertension, poorly controlled type 2 diabetes, venous stasis changes presenting with right lower extremity cellulitis.  Patient reports worsening right extremity wound over the past 1 to 2 weeks.  Was evaluated October 18 at primary care office.  Was placed on course of clindamycin for treatment.  Wound culture grew out strep agalactiae.  Noted prior history of MRSA infection per report.  Patient reports worsening symptoms despite treatment.  Blood sugars to 200s to 300s at home.  Positive generalized malaise.  No chest pain or shortness of breath.  No nausea or vomiting.  No reported alcohol or tobacco use. Presented to the ER afebrile, heart rate in 100s, BP stable.  Satting well on room air.  White count 11.4, hemoglobin 9.4, platelets 479, lactate 1.9, creatinine 1.6, glucose 472.  CT imaging with noted cutaneous wound at the mid shin with surrounding cellulitis.  No definitive abscess.   Assessment and Plan:  Hidradenitis and cellulitis of right lower extremity Multiple pustules on bilateral lower extremity  small abscess under left breast Worsening right lower extremity cellulitis despite treatment with outpatient course of clindamycin Noted October 18 wound culture growing strep agalactiae White count 11.4 CT imaging negative for abscess but is showing signs concerning for cellulitis Patient was started on IV Rocephin, Flagyl vancomycin for broad-spectrum coverage Pharmacy consulted for dosing and trough monitoring of vancomycin Follow panculture MRSA swab Blood sugar control in setting of type 2 diabetes Continue as needed medication  for pain control S/p morphine IV, developed generalized itching, use Atarax and Benadryl as needed, Pepcid 20 mg IV x 2 doses order placed   Left breast abscess Continue antibiotics Follow ultrasound left breast General Surgery consulted, plan for I&D tomorrow a.m. Follow fluid culture    Type II diabetes mellitus with renal manifestations Blood sugar in 400s without overt acidosis 10/27 increased Semglee 30 units nightly, 15 units x 1 dose given in the morning Continue NovoLog 6 units 3 times a day and sliding scale. Monitor CBG, continue diabetic diet A1c 11.4 uncontrolled Diabetic coordinator consulted for management and counseling.    Hypertension associated with diabetes Continue metoprolol 50 mg p.o. twice daily, losartan 50 mg p.o. daily Monitor BP and titrate medication accordingly    CKD (chronic kidney disease) stage 3, GFR 30-59 ml/min  Creatinine 1.6 with GFR in the 40s Appears near baseline Monitor  Pseudohyponatremia due to hyperglycemia Monitor sodium level daily  Iron deficiency Anemia, transferrin saturation 10% Started oral iron supplement Folic acid level 6.9, at lower end, started folic acid 1 mg p.o. daily to prevent deficiency Vitamin B12 level 375, goal >400, started oral supplement to prevent deficiency.  Morbid obesity Body mass index is 43.84 kg/m.  Nutrition Problem: Increased nutrient needs Etiology: wound healing Interventions: MVI, Liberalize Diet, Juven, Premier Protein Calorie restricted diet and daily exercise advised to lose body weight.  Lifestyle modification discussed.   Diet: Diabetic diet DVT Prophylaxis: Subcutaneous Lovenox   Advance goals of care discussion: Full code  Family Communication: family was not present at bedside, at the time of interview.  The pt provided permission to discuss medical plan with the family. Opportunity  was given to ask question and all questions were answered satisfactorily.   Disposition:   Pt is from Home, admitted with right lower extremity cellulitis, multiple pustules and small abscess under left breast, still on IV antibiotics, which precludes a safe discharge. Discharge to home, when stable, may need few days to improve.  Subjective: No significant events overnight, patient still feels that swelling under her left breast is not getting better, cellulitis on the right lower extremity is improving, no new pustules.   Physical Exam: General: NAD, lying comfortably Appear in no distress, affect appropriate Eyes: PERRLA ENT: Oral Mucosa Clear, moist  Neck: no JVD,  Cardiovascular: S1 and S2 Present, no Murmur,  Respiratory: good respiratory effort, Bilateral Air entry equal and Decreased, no Crackles, no wheezes Abdomen: Bowel Sound present, Soft and no tenderness,  Skin: Multiple lower extremity pustules, small abscess under left breast, multiple macules and healing scars from prior cutaneous pustules Extremities: RLE mid shin cellulitis, mild tenderness, multiple small pustules in the lower extremity, no significant edema, no calf tenderness Neurologic: without any new focal findings Gait not checked due to patient safety concerns  Vitals:   09/21/23 1616 09/21/23 2002 09/22/23 0450 09/22/23 0821  BP: 114/71 126/74 119/63 (!) 147/74  Pulse: 89 96 94 86  Resp: 18 18  16   Temp: 98.1 F (36.7 C) (!) 97.5 F (36.4 C) 99 F (37.2 C) 98.2 F (36.8 C)  TempSrc: Oral Oral    SpO2: 97% 96% 96% 96%  Weight:      Height:        Intake/Output Summary (Last 24 hours) at 09/22/2023 1245 Last data filed at 09/21/2023 1851 Gross per 24 hour  Intake 360 ml  Output --  Net 360 ml   Filed Weights   09/19/23 2352  Weight: 105.2 kg    Data Reviewed: I have personally reviewed and interpreted daily labs, tele strips, imagings as discussed above. I reviewed all nursing notes, pharmacy notes, vitals, pertinent old records I have discussed plan of care as described above  with RN and patient/family.  CBC: Recent Labs  Lab 09/19/23 2353 09/21/23 0426 09/22/23 0427  WBC 11.4* 11.1* 10.9*  NEUTROABS 8.2*  --   --   HGB 9.4* 8.6* 8.5*  HCT 30.0* 27.6* 27.6*  MCV 82.6 82.4 84.1  PLT 479* 384 403*   Basic Metabolic Panel: Recent Labs  Lab 09/19/23 2353 09/21/23 0426 09/22/23 0427  NA 126* 130* 130*  K 5.0 4.4 4.5  CL 93* 95* 99  CO2 23 26 23   GLUCOSE 472* 282* 335*  BUN 25* 23* 27*  CREATININE 1.60* 1.33* 1.62*  CALCIUM 8.8* 8.9 8.6*  MG  --   --  2.1  PHOS  --   --  4.0    Studies: No results found.  Scheduled Meds:  vitamin B-12  1,000 mcg Oral Daily   enoxaparin (LOVENOX) injection  50 mg Subcutaneous Q24H   escitalopram  40 mg Oral Daily   feeding supplement  237 mL Oral QHS   folic acid  1 mg Oral Daily   gabapentin  600 mg Oral BID   hydrocerin   Topical BID   insulin aspart  0-15 Units Subcutaneous TID WC   insulin aspart  0-5 Units Subcutaneous QHS   insulin aspart  6 Units Subcutaneous TID WC   insulin glargine-yfgn  30 Units Subcutaneous QHS   iron polysaccharides  150 mg Oral Daily   losartan  50 mg Oral Daily  metoprolol tartrate  50 mg Oral BID   multivitamin with minerals  1 tablet Oral Daily   nutrition supplement (JUVEN)  1 packet Oral BID BM   sodium chloride flush  10 mL Intravenous Q12H   Continuous Infusions:  cefTRIAXone (ROCEPHIN)  IV 2 g (09/22/23 0900)   And   metronidazole 500 mg (09/22/23 0620)   vancomycin 1,000 mg (09/22/23 0948)   PRN Meds: acetaminophen, cyclobenzaprine, diphenhydrAMINE, hydrOXYzine, ondansetron **OR** ondansetron (ZOFRAN) IV, oxyCODONE-acetaminophen  Time spent: 55 minutes  Author: Gillis Santa. MD [facility] 09/22/2023 12:45 PM  To reach On-call, see care teams to locate the attending and reach out to them via [email or web address]. If 7PM-7AM, please contact night-coverage If you still have difficulty reaching the attending provider, please page the [facility] (Director on  Call) for [facility] on amion for assistance.

## 2023-09-22 NOTE — Inpatient Diabetes Management (Signed)
Inpatient Diabetes Program Recommendations  AACE/ADA: New Consensus Statement on Inpatient Glycemic Control (2015)  Target Ranges:  Prepandial:   less than 140 mg/dL      Peak postprandial:   less than 180 mg/dL (1-2 hours)      Critically ill patients:  140 - 180 mg/dL   Lab Results  Component Value Date   GLUCAP 427 (H) 09/22/2023   HGBA1C 11.4 (H) 09/21/2023    Review of Glycemic Control  Diabetes history: DM 2 Outpatient Diabetes medications: Trulicity .75 mg qwk, Novolog 0-9 units TID, Metformin 1000 mg BID  Current orders for Inpatient glycemic control:  Semglee 30 units qhs (15 more units added this am) Novolog 0-15 units tid + hs Novolog 6 units tid meal coverage  Ensure Enlive Daily at bedtime (40 grams of carbohydrate) could be contributing to hyperglycemia in the am  Note the increase in insulin needs, Semglee increased, however, would watch as the Ensure supplement maybe the contributing factor in the hyperglycemia in the mornings. Want to watch one more day.  Thanks, Christena Deem RN, MSN, BC-ADM Inpatient Diabetes Coordinator Team Pager [phone] (8a-5p)

## 2023-09-23 ENCOUNTER — Encounter: Payer: Self-pay | Admitting: Internal Medicine

## 2023-09-23 ENCOUNTER — Other Ambulatory Visit (HOSPITAL_COMMUNITY): Payer: Self-pay

## 2023-09-23 ENCOUNTER — Inpatient Hospital Stay: Payer: MEDICAID | Admitting: Registered Nurse

## 2023-09-23 ENCOUNTER — Encounter: Payer: Self-pay | Admitting: Family Medicine

## 2023-09-23 ENCOUNTER — Telehealth (HOSPITAL_COMMUNITY): Payer: Self-pay | Admitting: Pharmacy Technician

## 2023-09-23 ENCOUNTER — Other Ambulatory Visit: Payer: Self-pay

## 2023-09-23 ENCOUNTER — Encounter
Admission: EM | Disposition: A | Discharge status: Home / Self Care | Payer: Self-pay | Source: Home / Self Care | Attending: Student

## 2023-09-23 DIAGNOSIS — L03115 Cellulitis of right lower limb: Secondary | ICD-10-CM | POA: Diagnosis not present

## 2023-09-23 DIAGNOSIS — L02419 Cutaneous abscess of limb, unspecified: Secondary | ICD-10-CM

## 2023-09-23 DIAGNOSIS — N611 Abscess of the breast and nipple: Secondary | ICD-10-CM | POA: Diagnosis not present

## 2023-09-23 DIAGNOSIS — L02415 Cutaneous abscess of right lower limb: Secondary | ICD-10-CM | POA: Diagnosis not present

## 2023-09-23 HISTORY — PX: INCISION AND DRAINAGE ABSCESS: SHX5864

## 2023-09-23 HISTORY — PX: IRRIGATION AND DEBRIDEMENT ABSCESS: SHX5252

## 2023-09-23 LAB — GLUCOSE, CAPILLARY
Glucose-Capillary: 274 mg/dL — ABNORMAL HIGH (ref 70–99)
Glucose-Capillary: 220 mg/dL — ABNORMAL HIGH (ref 70–99)
Glucose-Capillary: 164 mg/dL — ABNORMAL HIGH (ref 70–99)
Glucose-Capillary: 160 mg/dL — ABNORMAL HIGH (ref 70–99)
Glucose-Capillary: 192 mg/dL — ABNORMAL HIGH (ref 70–99)
Glucose-Capillary: 385 mg/dL — ABNORMAL HIGH (ref 70–99)

## 2023-09-23 LAB — BASIC METABOLIC PANEL
Anion gap: 8 (ref 5–15)
BUN: 27 mg/dL — ABNORMAL HIGH (ref 6–20)
CO2: 23 mmol/L (ref 22–32)
Calcium: 8.5 mg/dL — ABNORMAL LOW (ref 8.9–10.3)
Chloride: 99 mmol/L (ref 98–111)
Creatinine, Ser: 1.53 mg/dL — ABNORMAL HIGH (ref 0.44–1.00)
GFR, Estimated: 44 mL/min — ABNORMAL LOW (ref >60)
Glucose, Bld: 255 mg/dL — ABNORMAL HIGH (ref 70–99)
Potassium: 4.3 mmol/L (ref 3.5–5.1)
Sodium: 130 mmol/L — ABNORMAL LOW (ref 135–145)

## 2023-09-23 LAB — VITAMIN D 25 HYDROXY (VIT D DEFICIENCY, FRACTURES): Vit D, 25-Hydroxy: 9.94 ng/mL — ABNORMAL LOW (ref 30–100)

## 2023-09-23 LAB — MRSA NEXT GEN BY PCR, NASAL: MRSA by PCR Next Gen: NOT DETECTED

## 2023-09-23 SURGERY — INCISION AND DRAINAGE, ABSCESS
Anesthesia: General | Site: Leg Lower | Laterality: Right

## 2023-09-23 MED ORDER — MIDAZOLAM HCL 2 MG/2ML IJ SOLN
INTRAMUSCULAR | Status: AC
  Filled 2023-09-23: qty 2

## 2023-09-23 MED ORDER — 0.9 % SODIUM CHLORIDE (POUR BTL) OPTIME
TOPICAL | Status: DC | PRN
  Administered 2023-09-23: 200 mL

## 2023-09-23 MED ORDER — FENTANYL CITRATE (PF) 100 MCG/2ML IJ SOLN
INTRAMUSCULAR | Status: AC
  Filled 2023-09-23: qty 2

## 2023-09-23 MED ORDER — PROPOFOL 10 MG/ML IV BOLUS
INTRAVENOUS | Status: DC | PRN
  Administered 2023-09-23: 150 mg via INTRAVENOUS

## 2023-09-23 MED ORDER — MELATONIN 5 MG PO TABS
5.0000 mg | ORAL_TABLET | Freq: Every day | ORAL | Status: DC
  Administered 2023-09-23 – 2023-09-24 (×3): 5 mg via ORAL
  Filled 2023-09-23 (×4): qty 1

## 2023-09-23 MED ORDER — MORPHINE SULFATE (PF) 2 MG/ML IV SOLN
2.0000 mg | INTRAVENOUS | Status: DC | PRN
  Administered 2023-09-23 – 2023-09-24 (×3): 2 mg via INTRAVENOUS
  Filled 2023-09-23 (×3): qty 1

## 2023-09-23 MED ORDER — PROPOFOL 10 MG/ML IV BOLUS
INTRAVENOUS | Status: AC
  Filled 2023-09-23: qty 20

## 2023-09-23 MED ORDER — LACTATED RINGERS IV SOLN: INTRAVENOUS | Status: DC | PRN

## 2023-09-23 MED ORDER — FENTANYL CITRATE (PF) 100 MCG/2ML IJ SOLN
25.0000 ug | INTRAMUSCULAR | Status: DC | PRN
  Administered 2023-09-23 (×2): 50 ug via INTRAVENOUS

## 2023-09-23 MED ORDER — FENTANYL CITRATE (PF) 100 MCG/2ML IJ SOLN
INTRAMUSCULAR | Status: DC | PRN
  Administered 2023-09-23: 50 ug via INTRAVENOUS

## 2023-09-23 MED ORDER — SODIUM CHLORIDE 0.9 % IV SOLN
2.0000 g | INTRAVENOUS | Status: DC
  Administered 2023-09-24 – 2023-09-25 (×2): 2 g via INTRAVENOUS
  Filled 2023-09-23 (×2): qty 20

## 2023-09-23 MED ORDER — DEXAMETHASONE SODIUM PHOSPHATE 10 MG/ML IJ SOLN
INTRAMUSCULAR | Status: DC | PRN
  Administered 2023-09-23: 5 mg via INTRAVENOUS

## 2023-09-23 MED ORDER — BUPIVACAINE-EPINEPHRINE (PF) 0.25% -1:200000 IJ SOLN
INTRAMUSCULAR | Status: AC
  Filled 2023-09-23: qty 30

## 2023-09-23 MED ORDER — KETOROLAC TROMETHAMINE 30 MG/ML IJ SOLN
15.0000 mg | Freq: Four times a day (QID) | INTRAMUSCULAR | Status: DC
  Administered 2023-09-23 – 2023-09-24 (×3): 15 mg via INTRAVENOUS
  Filled 2023-09-23 (×3): qty 1

## 2023-09-23 MED ORDER — LIDOCAINE HCL (CARDIAC) PF 100 MG/5ML IV SOSY
PREFILLED_SYRINGE | INTRAVENOUS | Status: DC | PRN
  Administered 2023-09-23: 100 mg via INTRAVENOUS

## 2023-09-23 MED ORDER — ROCURONIUM BROMIDE 100 MG/10ML IV SOLN
INTRAVENOUS | Status: DC | PRN
  Administered 2023-09-23: 60 mg via INTRAVENOUS

## 2023-09-23 MED ORDER — LIDOCAINE HCL (PF) 2 % IJ SOLN
INTRAMUSCULAR | Status: AC
Start: 2023-09-23 — End: ?
  Filled 2023-09-23: qty 5

## 2023-09-23 MED ORDER — METRONIDAZOLE 500 MG/100ML IV SOLN
500.0000 mg | Freq: Three times a day (TID) | INTRAVENOUS | Status: DC
  Administered 2023-09-23 – 2023-09-25 (×6): 500 mg via INTRAVENOUS
  Filled 2023-09-23 (×7): qty 100

## 2023-09-23 MED ORDER — BUPIVACAINE-EPINEPHRINE (PF) 0.25% -1:200000 IJ SOLN
INTRAMUSCULAR | Status: DC | PRN
  Administered 2023-09-23 (×2): 25 mL via SURGICAL_CAVITY

## 2023-09-23 MED ORDER — MIDAZOLAM HCL 2 MG/2ML IJ SOLN
INTRAMUSCULAR | Status: DC | PRN
  Administered 2023-09-23: 2 mg via INTRAVENOUS

## 2023-09-23 MED ORDER — SUGAMMADEX SODIUM 200 MG/2ML IV SOLN
INTRAVENOUS | Status: DC | PRN
  Administered 2023-09-23: 200 mg via INTRAVENOUS

## 2023-09-23 MED ORDER — BUPIVACAINE LIPOSOME 1.3 % IJ SUSP
INTRAMUSCULAR | Status: AC
  Filled 2023-09-23: qty 20

## 2023-09-23 SURGICAL SUPPLY — 27 items
BANDAGE GAUZE 1X75IN STRL (MISCELLANEOUS) IMPLANT
BLADE CLIPPER SURG (BLADE) ×2 IMPLANT
BLADE SURG 15 STRL LF DISP TIS (BLADE) ×2 IMPLANT
BLADE SURG 15 STRL SS (BLADE) ×2
BNDG CMPR 75X11 PLY HI ABS (MISCELLANEOUS) ×4
BNDG GAUZE 1X75IN STRL (MISCELLANEOUS) ×4
BRUSH SCRUB EZ 4% CHG (MISCELLANEOUS) ×2 IMPLANT
DRAPE LAPAROTOMY 77X122 PED (DRAPES) ×2 IMPLANT
ELECT REM PT RETURN 9FT ADLT (ELECTROSURGICAL) ×2
ELECTRODE REM PT RTRN 9FT ADLT (ELECTROSURGICAL) ×2 IMPLANT
GAUZE 4X4 16PLY ~~LOC~~+RFID DBL (SPONGE) ×2 IMPLANT
GAUZE SPONGE 4X4 12PLY STRL (GAUZE/BANDAGES/DRESSINGS) IMPLANT
GLOVE BIO SURGEON STRL SZ7 (GLOVE) ×2 IMPLANT
GOWN STRL REUS W/ TWL LRG LVL3 (GOWN DISPOSABLE) ×4 IMPLANT
GOWN STRL REUS W/TWL LRG LVL3 (GOWN DISPOSABLE) ×4
MANIFOLD NEPTUNE II (INSTRUMENTS) ×2 IMPLANT
NDL HYPO 22X1.5 SAFETY MO (MISCELLANEOUS) ×2 IMPLANT
NEEDLE HYPO 22X1.5 SAFETY MO (MISCELLANEOUS) ×2 IMPLANT
NS IRRIG 1000ML POUR BTL (IV SOLUTION) ×2 IMPLANT
PACK BASIN MINOR ARMC (MISCELLANEOUS) ×2 IMPLANT
SOL PREP PVP 2OZ (MISCELLANEOUS) ×2
SOLUTION PREP PVP 2OZ (MISCELLANEOUS) ×2 IMPLANT
SPONGE T-LAP 18X18 ~~LOC~~+RFID (SPONGE) ×2 IMPLANT
SYR 30ML LL (SYRINGE) IMPLANT
TOWEL OR 17X26 4PK STRL BLUE (TOWEL DISPOSABLE) IMPLANT
TRAP FLUID SMOKE EVACUATOR (MISCELLANEOUS) ×2 IMPLANT
WATER STERILE IRR 500ML POUR (IV SOLUTION) ×2 IMPLANT

## 2023-09-23 NOTE — Inpatient Diabetes Management (Addendum)
Inpatient Diabetes Program Recommendations  AACE/ADA: New Consensus Statement on Inpatient Glycemic Control (2015)  Target Ranges:  Prepandial:   less than 140 mg/dL      Peak postprandial:   less than 180 mg/dL (1-2 hours)      Critically ill patients:  140 - 180 mg/dL    Latest Reference Range & Units 02/15/23 13:14 06/27/23 07:28 09/21/23 04:16  Hemoglobin A1C 4.8 - 5.6 % 8.9 (H) 10.3 ! 11.4 (H)  280 mg/dl  (H): Data is abnormally high !: Data is abnormal  Latest Reference Range & Units 09/21/23 07:58 09/21/23 12:13 09/21/23 16:17 09/21/23 20:18  Glucose-Capillary 70 - 99 mg/dL 454 (H)  17 units Novolog  175 (H)  9 units Novolog  319 (H)  17 units Novolog  178 (H)    12 units Semglee  (H): Data is abnormally high  Latest Reference Range & Units 09/22/23 08:24 09/22/23 11:48 09/22/23 15:51 09/22/23 20:13  Glucose-Capillary 70 - 99 mg/dL 098 (H)  26 units Novolog  256 (H)  14 units Novolog  15 units Semglee @1019   282 (H)  14 units Novolog  337 (H)  4 units Novolog  30 units Semglee @2042   (H): Data is abnormally high  Latest Reference Range & Units 09/23/23 07:42  Glucose-Capillary 70 - 99 mg/dL 119 (H)  8 units Novolog   (H): Data is abnormally high   Admit with: Worsening right lower extremity cellulitis   History: DM2, Hidradenitis suppurativa, CKD   Home DM Meds: Trulicity .75 mg qwk (NOT taking)      Novolog 0-9 units TID      Metformin 1000 mg BID       Dexcom G7 CGM??  Current Orders: Semglee 30 units at bedtime     Novolog 6 units TID with meals     Novolog Moderate Correction Scale/ SSI (0-15 units) TID AC + HS   PCP: Jethro Bastos, [facility] with [facility]??   Last seen 06/26/2023 Looks like pt was given Rx for Dexcom G7 in June 2024 at PCP office?  NPO for I&D of breast today    MD- Note pt received total of 45 units Semglee insulin yesterday (15 units AM, 30 units PM).  Only scheduled to get 30 units Semglee  tonight.  CBG 274 this AM  Please consider:  1. Increase Semglee to 45 units at bedtime  2. Increase Novolog Meal Coverage to 10 units TID with meals after pt resumes PO diet today  Pt may need more intensive Insulin regimen at home (only taking Novolog?) Her insurance will cover both Lantus and Novolog at $4 co-pay Please consider restarting pt on Lantus at home when discharged    Addendum 11am--Met w/ pt at bedside prior to I&D.  Pt told me she used to take Lantus daily at home but stopped about 2 years ago.  Has been taking Novolog 30 units TID with meals + Metformin 1000 mg BID.  Has NOT been taking the Trulicity.  We briefly discussed her A1c of 11.4% and the importance of better CBG control to help prevent further infections and to help clear her current infections.  Discussed with pt that having better CBG control could potentially help relieve her hydradenitis suppurativa as well.  Pt told me she was seeing Provider at [facility] but would like to transfer her care to the [place] clinic.  Has been having a hard time getting an appt at [facility]--Note [facility] consulted 10/25 for this issue.  Has traditional CBG meter at home.  Tried the [employer] 2 CGM before and had success with 1 sensor but stated the other sensor "fell off" and she never tried them again but is willing to try them to get her CBGs better managed.  Is open and willing to take Lantus at home again if needed.     --Will follow patient during hospitalization--  Ambrose Finland RN, MSN, CDCES Diabetes Coordinator Inpatient Glycemic Control Team Team Pager: [phone] (8a-5p)

## 2023-09-23 NOTE — Transfer of Care (Signed)
Immediate Anesthesia Transfer of Care Note  Patient: Theresa Malone  Procedure(s) Performed: INCISION AND DRAINAGE ABSCESS (Left)  Patient Location: PACU  Anesthesia Type:General  Level of Consciousness: awake, alert , and oriented  Airway & Oxygen Therapy: Patient Spontanous Breathing  Post-op Assessment: Report given to RN and Post -op Vital signs reviewed and stable  Post vital signs: Reviewed and stable  Last Vitals:  Vitals Value Taken Time  BP 151/85   Temp    Pulse 89 09/23/23 1610  Resp 19 09/23/23 1610  SpO2 100 % 09/23/23 1610  Vitals shown include unfiled device data.  Last Pain:  Vitals:   09/23/23 1350  TempSrc: Temporal  PainSc: 0-No pain      Patients Stated Pain Goal: 2 (09/22/23 2212)  Complications: No notable events documented.

## 2023-09-23 NOTE — Telephone Encounter (Signed)
Pharmacy Patient Advocate Encounter   Received notification that prior authorization for Dexcom G7 Sensor is required/requested.   Insurance verification completed.   The patient is insured through [employer] .   Per test claim: PA required; PA submitted to [facility] via CoverMyMeds Key/confirmation #/EOC [id number] Status is pending

## 2023-09-23 NOTE — Anesthesia Postprocedure Evaluation (Signed)
Anesthesia Post Note  Patient: Theresa Malone  Procedure(s) Performed: INCISION AND DRAINAGE OF BREAST ABSCESS (Left: Breast) INCISION AND DRAINAGE OF LOWER LEG ABSCESS (Right: Leg Lower)  Patient location during evaluation: PACU Anesthesia Type: General Level of consciousness: awake and alert Pain management: pain level controlled Vital Signs Assessment: post-procedure vital signs reviewed and stable Respiratory status: spontaneous breathing, nonlabored ventilation, respiratory function stable and patient connected to nasal cannula oxygen Cardiovascular status: blood pressure returned to baseline and stable Postop Assessment: no apparent nausea or vomiting Anesthetic complications: no   No notable events documented.   Last Vitals:  Vitals:   09/23/23 1646 09/23/23 1659  BP: 110/88 118/72  Pulse: 84 78  Resp: (!) 21 16  Temp:  36.8 C  SpO2: 100% 97%    Last Pain:  Vitals:   09/23/23 1728  TempSrc:   PainSc: 10-Worst pain ever                 Louie Boston

## 2023-09-23 NOTE — Telephone Encounter (Signed)
Pharmacy Patient Advocate Encounter   Received notification that prior authorization for FreeStyle Libre 2 Sensor is required/requested.   Insurance verification completed.   The patient is insured through [employer] [place] .   Per test claim: PA required; PA submitted to [facility] via CoverMyMeds Key/confirmation #/EOC [id number] Status is pending

## 2023-09-23 NOTE — Consult Note (Signed)
WOC Nurse Consult Note: Reason for Consult: Exacerbation of hidradenitis suppurativa with open lesions to left breast, right groin and trauma wound to Right lower leg.  To surgery 09/23/23 for abscess to lesion on left breast.   Wound type: inflammatory, infectious Pressure Injury POA: NA Measurement: See flow sheets on admission assessment Wound bed: purulence, fluctuance and tender to touch.  Drainage (amount, consistency, odor) moderate purulence Periwound: tenderness, induration and warmth Dressing procedure/placement/frequency: CLeanse open wounds to Right groin and right lower leg with NS and pat dry. Apply aquacel [facility] # [id number])  to open wounds.  Cover with dry gauze and kerlix to leg and silicone foam to groin.  Surgery to order care for left breast wound after I & D.  Interdry skin fold management linen to abdominal/inguinal skin folds to absorb moisture and prevent further outbreak:  Order [facility] [place] # [phone] Measure and cut length of InterDry to fit in skin folds that have skin breakdown Tuck InterDry fabric into skin folds in a single layer, allow for 2 inches of overhang from skin edges to allow for wicking to occur May remove to bathe; dry area thoroughly and then tuck into affected areas again Do not apply any creams or ointments when using InterDry DO NOT THROW AWAY FOR 5 DAYS unless soiled with stool DO NOT [facility] product, this will inactivate the silver in the material  New sheet of Interdry should be applied after 5 days of use if patient continues to have skin breakdown   Will not follow at this time.  Please re-consult if needed.  Mike Gip MSN, RN, FNP-BC CWON Wound, Ostomy, Continence Nurse [facility] [phone] Pager [phone]

## 2023-09-23 NOTE — Consult Note (Signed)
Pharmacy Antibiotic Note  Theresa Malone is a 42 y.o. female admitted on 09/20/2023 with  worsening cellulitis / abscess of right lower leg . In review of chart, appears patient failed outpatient therapy with clindamycin. Wound culture from 09/13/23 showed GBS. Pharmacy has been consulted for vancomycin dosing.  Plan: Continue vancomycin 1000 mg q24H.  Goal AUC 400-600 Predicted AUC of 538.3, Cmin 14.8 Used Scr 1.53 Vd 0.5. IBW.   Plan to order vancomycin level after 4th or 5th doses.    Height: 5\' 1"  (154.9 cm) Weight: 105.2 kg (232 lb) IBW/kg (Calculated) : 47.8  Temp (24hrs), Avg:98.2 F (36.8 C), Min:97.3 F (36.3 C), Max:98.9 F (37.2 C)  Recent Labs  Lab 09/19/23 2353 09/20/23 0504 09/21/23 0426 09/22/23 0427  WBC 11.4*  --  11.1* 10.9*  CREATININE 1.60*  --  1.33* 1.62*  LATICACIDVEN  --  1.9  --   --     Estimated Creatinine Clearance: 52.1 mL/min (A) (by C-G formula based on SCr of 1.62 mg/dL (H)).    Allergies  Allergen Reactions   Sulfa Antibiotics Hives   Lisinopril Cough    cough    Antimicrobials this admission: Vancomycin 10/25 >>  Ceftriaxone 10/25 >>  Metronidazole 10/25 >>   Dose adjustments this admission: N/A  Microbiology results: 10/25 BCx: NG x 2 days 10/28 MRSA PCR: Not detected  Thank you for allowing pharmacy to be a part of this patient's care.  Elliot Gurney, PharmD, BCPS Clinical Pharmacist  09/23/2023 7:51 AM

## 2023-09-23 NOTE — Progress Notes (Signed)
[facility] Progress Note  Patient: Theresa Malone    ACZ:[id number]  DOA: 09/20/2023     Date of Service: the patient was seen and examined on 09/23/2023  Chief Complaint  Patient presents with   Cellulitis   Brief hospital course: Theresa Malone is a 42 y.o. female with medical history significant of obesity, hidradenitis suppurativa, gout, hypertension, poorly controlled type 2 diabetes, venous stasis changes presenting with right lower extremity cellulitis.  Patient reports worsening right extremity wound over the past 1 to 2 weeks.  Was evaluated October 18 at primary care office.  Was placed on course of clindamycin for treatment.  Wound culture grew out strep agalactiae.  Noted prior history of MRSA infection per report.  Patient reports worsening symptoms despite treatment.  Blood sugars to 200s to 300s at home.  Positive generalized malaise.  No chest pain or shortness of breath.  No nausea or vomiting.  No reported alcohol or tobacco use. Presented to the ER afebrile, heart rate in 100s, BP stable.  Satting well on room air.  White count 11.4, hemoglobin 9.4, platelets 479, lactate 1.9, creatinine 1.6, glucose 472.  CT imaging with noted cutaneous wound at the mid shin with surrounding cellulitis.  No definitive abscess.   Assessment and Plan:  Hidradenitis and cellulitis of right lower extremity Multiple pustules on bilateral lower extremity  small abscess under left breast Worsening right lower extremity cellulitis despite treatment with outpatient course of clindamycin Noted October 18 wound culture growing strep agalactiae White count 11.4 CT imaging negative for abscess but is showing signs concerning for cellulitis Patient was started on IV Rocephin, Flagyl vancomycin for broad-spectrum coverage Pharmacy consulted for dosing and trough monitoring of vancomycin MRSA swab negative, blood culture NGTD Blood sugar control in setting of type 2 diabetes Continue as needed  medication for pain control S/p morphine IV, developed generalized itching, use Atarax and Benadryl as needed, Pepcid 20 mg IV x 2 doses order placed 10/28 d/w general surgery to squeeze right lower extremity fluid collection if possible.  Left breast abscess Continue antibiotics Follow ultrasound left breast General Surgery consulted, plan for I&D today Follow fluid culture    Type II diabetes mellitus with renal manifestations Blood sugar in 400s without overt acidosis 10/27 increased Semglee 30 units nightly, 15 units x 1 dose given in the morning Continue NovoLog 6 units 3 times a day and sliding scale. Monitor CBG, continue diabetic diet A1c 11.4 uncontrolled Diabetic coordinator consulted for management and counseling.    Hypertension associated with diabetes Continue metoprolol 50 mg p.o. twice daily, losartan 50 mg p.o. daily Monitor BP and titrate medication accordingly    CKD (chronic kidney disease) stage 3, GFR 30-59 ml/min  Creatinine 1.6 with GFR in the 40s Appears near baseline Monitor  Pseudohyponatremia due to hyperglycemia Monitor sodium level daily  Iron deficiency Anemia, transferrin saturation 10% Started oral iron supplement Folic acid level 6.9, at lower end, started folic acid 1 mg p.o. daily to prevent deficiency Vitamin B12 level 375, goal >400, started oral supplement to prevent deficiency.  Morbid obesity Body mass index is 43.84 kg/m.  Nutrition Problem: Increased nutrient needs Etiology: wound healing Interventions: MVI, Liberalize Diet, Juven, Premier Protein Calorie restricted diet and daily exercise advised to lose body weight.  Lifestyle modification discussed.   Diet: Diabetic diet DVT Prophylaxis: Subcutaneous Lovenox   Advance goals of care discussion: Full code  Family Communication: family was not present at bedside, at the time of interview.  The pt provided permission to discuss medical plan with the family. Opportunity was  given to ask question and all questions were answered satisfactorily.   Disposition:  Pt is from Home, admitted with right lower extremity cellulitis, multiple pustules and small abscess under left breast, still on IV antibiotics, which precludes a safe discharge. Discharge to home, when stable, may need few days to improve.  Subjective: No significant events overnight, patient still having significant pain in the right lower extremity and under the left breast due to abscess.  Denies any other complaints. Patient agreed for I&D, which will be done by general surgery.    Physical Exam: General: NAD, lying comfortably Appear in no distress, affect appropriate Eyes: PERRLA ENT: Oral Mucosa Clear, moist  Neck: no JVD,  Cardiovascular: S1 and S2 Present, no Murmur,  Respiratory: good respiratory effort, Bilateral Air entry equal and Decreased, no Crackles, no wheezes Abdomen: Bowel Sound present, Soft and no tenderness,  Skin: Multiple lower extremity pustules, small abscess under left breast, multiple macules and healing scars from prior cutaneous pustules Extremities: RLE mid shin cellulitis, mild erythremia and tenderness, some fluid drainage noticed today.  Multiple small pustules are resolving.  no significant edema, no calf tenderness Neurologic: without any new focal findings Gait not checked due to patient safety concerns  Vitals:   09/22/23 2039 09/22/23 2053 09/23/23 0400 09/23/23 0743  BP: 132/76   121/73  Pulse: 82  83 88  Resp: 18  18 20   Temp:  98.9 F (37.2 C) 98.3 F (36.8 C) (!) 97.3 F (36.3 C)  TempSrc:  Oral Oral Oral  SpO2: 98%  97% 98%  Weight:      Height:        Intake/Output Summary (Last 24 hours) at 09/23/2023 1148 Last data filed at 09/23/2023 0100 Gross per 24 hour  Intake 100 ml  Output --  Net 100 ml   Filed Weights   09/19/23 2352  Weight: 105.2 kg    Data Reviewed: I have personally reviewed and interpreted daily labs, tele strips,  imagings as discussed above. I reviewed all nursing notes, pharmacy notes, vitals, pertinent old records I have discussed plan of care as described above with RN and patient/family.  CBC: Recent Labs  Lab 09/19/23 2353 09/21/23 0426 09/22/23 0427  WBC 11.4* 11.1* 10.9*  NEUTROABS 8.2*  --   --   HGB 9.4* 8.6* 8.5*  HCT 30.0* 27.6* 27.6*  MCV 82.6 82.4 84.1  PLT 479* 384 403*   Basic Metabolic Panel: Recent Labs  Lab 09/19/23 2353 09/21/23 0426 09/22/23 0427 09/23/23 0838  NA 126* 130* 130* 130*  K 5.0 4.4 4.5 4.3  CL 93* 95* 99 99  CO2 23 26 23 23   GLUCOSE 472* 282* 335* 255*  BUN 25* 23* 27* 27*  CREATININE 1.60* 1.33* 1.62* 1.53*  CALCIUM 8.8* 8.9 8.6* 8.5*  MG  --   --  2.1  --   PHOS  --   --  4.0  --     Studies: No results found.  Scheduled Meds:  vitamin B-12  1,000 mcg Oral Daily   enoxaparin (LOVENOX) injection  50 mg Subcutaneous Q24H   escitalopram  40 mg Oral Daily   feeding supplement  237 mL Oral QHS   folic acid  1 mg Oral Daily   gabapentin  600 mg Oral BID   hydrocerin   Topical BID   insulin aspart  0-15 Units Subcutaneous TID WC   insulin aspart  0-5 Units Subcutaneous QHS   insulin aspart  6 Units Subcutaneous TID WC   insulin glargine-yfgn  30 Units Subcutaneous QHS   iron polysaccharides  150 mg Oral Daily   losartan  50 mg Oral Daily   melatonin  5 mg Oral QHS   metoprolol tartrate  50 mg Oral BID   multivitamin with minerals  1 tablet Oral Daily   nutrition supplement (JUVEN)  1 packet Oral BID BM   sodium chloride flush  10 mL Intravenous Q12H   Continuous Infusions:  cefTRIAXone (ROCEPHIN)  IV 2 g (09/23/23 0939)   And   metronidazole 500 mg (09/23/23 0747)   vancomycin 1,000 mg (09/23/23 0947)   PRN Meds: acetaminophen, cyclobenzaprine, diphenhydrAMINE, hydrOXYzine, ondansetron **OR** ondansetron (ZOFRAN) IV, oxyCODONE-acetaminophen  Time spent: 55 minutes  Author: Gillis Santa. MD [facility] 09/23/2023 11:48  AM  To reach On-call, see care teams to locate the attending and reach out to them via [email or web address]. If 7PM-7AM, please contact night-coverage If you still have difficulty reaching the attending provider, please page the [facility] (Director on Call) for [facility] on amion for assistance.

## 2023-09-23 NOTE — Op Note (Signed)
  09/23/2023  3:57 PM  PATIENT:  Theresa Malone  42 y.o. female  PRE-OPERATIVE DIAGNOSIS:  Left breast abscess, right leg abscess  POST-OPERATIVE DIAGNOSIS:  Same  PROCEDURE:   1. Incision and drainage of complex Left breast abscess 2. INcisional biopsy of left breast 3. Incision and drainage of complex Right leg abscess deep 4. Excisional  debridement of skin subcutaneous tissue and muscle         measuring 4 square centimeters    SURGEON:  Surgeons and Role:    * Arisa Congleton, Merri Ray, MD - Primary   ANESTHESIA: GETA  FINDINGS: left breast abscess, Right leg abscess deep   DICTATION:  Patient was explained about the procedure in detail; risk benefits, possible complications and a consent was obtained. The patient taken to the operating room and placed in the supine position.  We started on the left breast where an elliptical  incision was created and pus was drained and cultured. There were complex luculations that we were able to lyse with a combination of finger fracture and suction device. All the loculations were broken down. Incisional biopsy taken from deep cavity into the breast tissue , in addition to the breast biopsy a skin of the breast was also send for pathology to r/o cancer. Hemostasis obtained with cautery. Wound packed w 1/2 inch packing followed by dressing   Attention turned to the right leg  elliptical  incision was created and pus was drained and cultured. There were complex luculations that we were able to lyse with a combination of finger fracture and suction device. All the loculations were broken down  Using a sharp curette we debrided the sub q tissue down to the muscle to include fascia. Hemostasis was obtained with electrocautery. Irrigation with normal saline and the wound was packed with half-inch packing followed by sterile dressing.  Liposomal Marcaine was injected around the wounds site. Needle and laparotomy counts were correct and there were no  immediate complications  Leafy Ro, MD

## 2023-09-23 NOTE — Plan of Care (Signed)
  Problem: Health Behavior/Discharge Planning: Goal: Ability to manage health-related needs will improve Outcome: Progressing   Problem: Clinical Measurements: Goal: Ability to maintain clinical measurements within normal limits will improve Outcome: Progressing Goal: Will remain free from infection Outcome: Progressing Goal: Diagnostic test results will improve Outcome: Progressing   Problem: Activity: Goal: Risk for activity intolerance will decrease Outcome: Progressing   Problem: Nutrition: Goal: Adequate nutrition will be maintained Outcome: Progressing

## 2023-09-23 NOTE — TOC Benefit Eligibility Note (Signed)
Patient Product/process development scientist completed.    The patient is insured through [employer] [place].     Ran test claim for Lantus Pen and the current 30 day co-pay is $4.00.  Ran test claim for Novolog Pen and the current 30 day co-pay is $4.00.  Ran test claim for [employer] 3 Sensor and Requires Prior Authorization  Ran test claim for [employer] and Requires Prior Authorization  This test claim was processed through [employer]- copay amounts may vary at other pharmacies due to [employer], or as the patient moves through the different stages of their insurance plan.     Roland Earl, CPHT Pharmacy Technician III Certified Patient Advocate [facility] Patient Advocate Team Direct Number: [phone]  Fax: [phone]

## 2023-09-23 NOTE — Anesthesia Preprocedure Evaluation (Signed)
Anesthesia Evaluation  Patient identified by MRN, date of birth, ID band Patient awake    Reviewed: Allergy & Precautions, H&P , NPO status , Patient's Chart, lab work & pertinent test results, reviewed documented beta blocker date and time   History of Anesthesia Complications Negative for: history of anesthetic complications  Airway Mallampati: II  TM Distance: >3 FB Neck ROM: full    Dental  (+) Dental Advidsory Given, Teeth Intact   Pulmonary neg shortness of breath, sleep apnea (history is suggestive) , neg COPD, neg recent URI   Pulmonary exam normal breath sounds clear to auscultation       Cardiovascular Exercise Tolerance: Good hypertension, (-) angina (-) Past MI and (-) Cardiac Stents Normal cardiovascular exam(-) dysrhythmias (-) Valvular Problems/Murmurs Rhythm:regular Rate:Normal     Neuro/Psych  PSYCHIATRIC DISORDERS Anxiety Depression    negative neurological ROS     GI/Hepatic Neg liver ROS,GERD  ,,  Endo/Other  diabetes, Poorly Controlled, Insulin Dependent, Oral Hypoglycemic Agents  Morbid obesity  Renal/GU CRFRenal disease  negative genitourinary   Musculoskeletal   Abdominal   Peds  Hematology negative hematology ROS (+)   Anesthesia Other Findings Past Medical History: No date: Anxiety     Comment:  panic attacks No date: Cellulitis No date: Depression No date: Diabetes mellitus No date: Gout No date: Hidradenitis suppurativa No date: Hypertension No date: Obesity No date: Venous stasis No date: Venous stasis dermatitis   Reproductive/Obstetrics negative OB ROS                             Anesthesia Physical Anesthesia Plan  ASA: 3  Anesthesia Plan: General   Post-op Pain Management:    Induction: Intravenous  PONV Risk Score and Plan: 3 and Ondansetron, Dexamethasone, Midazolam and Treatment may vary due to age or medical condition  Airway  Management Planned: LMA and Oral ETT  Additional Equipment:   Intra-op Plan:   Post-operative Plan: Extubation in OR  Informed Consent: I have reviewed the patients History and Physical, chart, labs and discussed the procedure including the risks, benefits and alternatives for the proposed anesthesia with the patient or authorized representative who has indicated his/her understanding and acceptance.     Dental Advisory Given  Plan Discussed with: Anesthesiologist, CRNA and Surgeon  Anesthesia Plan Comments:        Anesthesia Quick Evaluation

## 2023-09-23 NOTE — Progress Notes (Signed)
The patient is asking  asking for Melatonin to sleep. Dr. Para March notified and awaiting for an order.

## 2023-09-23 NOTE — Plan of Care (Signed)
  Problem: Health Behavior/Discharge Planning: Goal: Ability to manage health-related needs will improve Outcome: Progressing   Problem: Clinical Measurements: Goal: Ability to maintain clinical measurements within normal limits will improve Outcome: Progressing Goal: Will remain free from infection Outcome: Progressing Goal: Diagnostic test results will improve Outcome: Progressing   Problem: Activity: Goal: Risk for activity intolerance will decrease Outcome: Progressing   Problem: Nutrition: Goal: Adequate nutrition will be maintained 09/23/2023 0727 by Gaye Alken, RN Outcome: Progressing 09/23/2023 0726 by Gaye Alken, RN Outcome: Progressing   Problem: Coping: Goal: Level of anxiety will decrease Outcome: Progressing   Problem: Skin Integrity: Goal: Risk for impaired skin integrity will decrease Outcome: Progressing   Problem: Coping: Goal: Ability to adjust to condition or change in health will improve Outcome: Progressing   Problem: Health Behavior/Discharge Planning: Goal: Ability to manage health-related needs will improve Outcome: Progressing   Problem: Metabolic: Goal: Ability to maintain appropriate glucose levels will improve Outcome: Progressing

## 2023-09-23 NOTE — Anesthesia Procedure Notes (Signed)
Procedure Name: Intubation Date/Time: 09/23/2023 3:33 PM  Performed by: Joanette Gula, Hartley Wyke, CRNAPre-anesthesia Checklist: Patient identified, Emergency Drugs available, Suction available and Patient being monitored Patient Re-evaluated:Patient Re-evaluated prior to induction Oxygen Delivery Method: Circle system utilized Preoxygenation: Pre-oxygenation with 100% oxygen Induction Type: IV induction Ventilation: Mask ventilation without difficulty Laryngoscope Size: McGraph and 4 Grade View: Grade I Tube type: Oral Number of attempts: 1 Airway Equipment and Method: Stylet, Oral airway and Video-laryngoscopy Placement Confirmation: ETT inserted through vocal cords under direct vision, positive ETCO2 and breath sounds checked- equal and bilateral Secured at: 22 cm Tube secured with: Tape Dental Injury: Teeth and Oropharynx as per pre-operative assessment  Comments: Performed by SRNA under supervision of CRNA and MDA

## 2023-09-24 ENCOUNTER — Encounter: Payer: Self-pay | Admitting: Surgery

## 2023-09-24 ENCOUNTER — Other Ambulatory Visit (HOSPITAL_COMMUNITY): Payer: Self-pay

## 2023-09-24 DIAGNOSIS — L03115 Cellulitis of right lower limb: Secondary | ICD-10-CM | POA: Diagnosis not present

## 2023-09-24 LAB — BASIC METABOLIC PANEL
Anion gap: 8 (ref 5–15)
BUN: 38 mg/dL — ABNORMAL HIGH (ref 6–20)
CO2: 22 mmol/L (ref 22–32)
Calcium: 8.4 mg/dL — ABNORMAL LOW (ref 8.9–10.3)
Chloride: 97 mmol/L — ABNORMAL LOW (ref 98–111)
Creatinine, Ser: 1.62 mg/dL — ABNORMAL HIGH (ref 0.44–1.00)
GFR, Estimated: 41 mL/min — ABNORMAL LOW (ref >60)
Glucose, Bld: 475 mg/dL — ABNORMAL HIGH (ref 70–99)
Potassium: 5.3 mmol/L — ABNORMAL HIGH (ref 3.5–5.1)
Sodium: 127 mmol/L — ABNORMAL LOW (ref 135–145)

## 2023-09-24 LAB — CBC
HCT: 26.2 % — ABNORMAL LOW (ref 36.0–46.0)
Hemoglobin: 8.4 g/dL — ABNORMAL LOW (ref 12.0–15.0)
MCH: 26.3 pg (ref 26.0–34.0)
MCHC: 32.1 g/dL (ref 30.0–36.0)
MCV: 82.1 fL (ref 80.0–100.0)
Platelets: 415 10*3/uL — ABNORMAL HIGH (ref 150–400)
RBC: 3.19 MIL/uL — ABNORMAL LOW (ref 3.87–5.11)
RDW: 14.8 % (ref 11.5–15.5)
WBC: 11.5 10*3/uL — ABNORMAL HIGH (ref 4.0–10.5)
nRBC: 0 % (ref 0.0–0.2)

## 2023-09-24 LAB — GLUCOSE, CAPILLARY
Glucose-Capillary: 519 mg/dL (ref 70–99)
Glucose-Capillary: 467 mg/dL — ABNORMAL HIGH (ref 70–99)
Glucose-Capillary: 483 mg/dL — ABNORMAL HIGH (ref 70–99)
Glucose-Capillary: 391 mg/dL — ABNORMAL HIGH (ref 70–99)
Glucose-Capillary: 403 mg/dL — ABNORMAL HIGH (ref 70–99)
Glucose-Capillary: 419 mg/dL — ABNORMAL HIGH (ref 70–99)
Glucose-Capillary: 412 mg/dL — ABNORMAL HIGH (ref 70–99)
Glucose-Capillary: 293 mg/dL — ABNORMAL HIGH (ref 70–99)
Glucose-Capillary: 164 mg/dL — ABNORMAL HIGH (ref 70–99)

## 2023-09-24 LAB — PHOSPHORUS: Phosphorus: 3.5 mg/dL (ref 2.5–4.6)

## 2023-09-24 LAB — MAGNESIUM: Magnesium: 2.3 mg/dL (ref 1.7–2.4)

## 2023-09-24 MED ORDER — INSULIN ASPART 100 UNIT/ML IJ SOLN
20.0000 [IU] | Freq: Once | INTRAMUSCULAR | Status: AC
  Administered 2023-09-24: 20 [IU] via SUBCUTANEOUS
  Filled 2023-09-24: qty 1

## 2023-09-24 MED ORDER — INSULIN GLARGINE-YFGN 100 UNIT/ML ~~LOC~~ SOLN
40.0000 [IU] | Freq: Two times a day (BID) | SUBCUTANEOUS | Status: DC
  Administered 2023-09-24 – 2023-09-25 (×2): 40 [IU] via SUBCUTANEOUS
  Filled 2023-09-24 (×3): qty 0.4

## 2023-09-24 MED ORDER — INSULIN ASPART 100 UNIT/ML IJ SOLN
10.0000 [IU] | Freq: Once | INTRAMUSCULAR | Status: AC
  Administered 2023-09-24: 10 [IU] via INTRAVENOUS
  Filled 2023-09-24: qty 0.1

## 2023-09-24 MED ORDER — INSULIN GLARGINE-YFGN 100 UNIT/ML ~~LOC~~ SOLN
30.0000 [IU] | Freq: Two times a day (BID) | SUBCUTANEOUS | Status: DC
  Administered 2023-09-24: 30 [IU] via SUBCUTANEOUS
  Filled 2023-09-24 (×2): qty 0.3

## 2023-09-24 MED ORDER — HYDROMORPHONE HCL 2 MG PO TABS
2.0000 mg | ORAL_TABLET | Freq: Four times a day (QID) | ORAL | Status: DC | PRN
  Administered 2023-09-24 – 2023-09-25 (×5): 2 mg via ORAL
  Filled 2023-09-24 (×5): qty 1

## 2023-09-24 MED ORDER — INSULIN ASPART 100 UNIT/ML IJ SOLN
15.0000 [IU] | Freq: Three times a day (TID) | INTRAMUSCULAR | Status: DC
  Administered 2023-09-24 – 2023-09-25 (×3): 15 [IU] via SUBCUTANEOUS
  Filled 2023-09-24 (×3): qty 1

## 2023-09-24 MED ORDER — VITAMIN D (ERGOCALCIFEROL) 1.25 MG (50000 UNIT) PO CAPS
50000.0000 [IU] | ORAL_CAPSULE | ORAL | Status: DC
  Administered 2023-09-24: 50000 [IU] via ORAL
  Filled 2023-09-24: qty 1

## 2023-09-24 MED ORDER — INSULIN ASPART 100 UNIT/ML IJ SOLN: 0.0000 [IU] | Freq: Every day | INTRAMUSCULAR | Status: DC

## 2023-09-24 MED ORDER — HYDROMORPHONE HCL 1 MG/ML IJ SOLN
1.0000 mg | Freq: Once | INTRAMUSCULAR | Status: AC | PRN
  Administered 2023-09-24: 1 mg via INTRAVENOUS
  Filled 2023-09-24: qty 1

## 2023-09-24 MED ORDER — VANCOMYCIN HCL 750 MG/150ML IV SOLN
750.0000 mg | INTRAVENOUS | Status: DC
  Administered 2023-09-24 – 2023-09-25 (×2): 750 mg via INTRAVENOUS
  Filled 2023-09-24 (×2): qty 150

## 2023-09-24 MED ORDER — SODIUM ZIRCONIUM CYCLOSILICATE 10 G PO PACK
10.0000 g | PACK | Freq: Once | ORAL | Status: AC
  Administered 2023-09-24: 10 g via ORAL
  Filled 2023-09-24: qty 1

## 2023-09-24 MED ORDER — INSULIN ASPART 100 UNIT/ML IJ SOLN
0.0000 [IU] | Freq: Three times a day (TID) | INTRAMUSCULAR | Status: DC
  Administered 2023-09-24 – 2023-09-25 (×2): 11 [IU] via SUBCUTANEOUS
  Administered 2023-09-25: 7 [IU] via SUBCUTANEOUS
  Filled 2023-09-24 (×3): qty 1

## 2023-09-24 MED ORDER — INSULIN GLARGINE-YFGN 100 UNIT/ML ~~LOC~~ SOLN
20.0000 [IU] | Freq: Once | SUBCUTANEOUS | Status: AC
  Administered 2023-09-24: 20 [IU] via SUBCUTANEOUS
  Filled 2023-09-24: qty 0.2

## 2023-09-24 MED ORDER — INSULIN ASPART 100 UNIT/ML IJ SOLN
10.0000 [IU] | Freq: Once | INTRAMUSCULAR | Status: AC
  Administered 2023-09-24: 10 [IU] via INTRAVENOUS
  Filled 2023-09-24 (×3): qty 0.1

## 2023-09-24 MED ORDER — INSULIN ASPART 100 UNIT/ML IJ SOLN
10.0000 [IU] | Freq: Once | INTRAMUSCULAR | Status: AC
  Administered 2023-09-24: 10 [IU] via INTRAVENOUS
  Filled 2023-09-24: qty 1
  Filled 2023-09-24: qty 0.1

## 2023-09-24 NOTE — Progress Notes (Signed)
CC: S/p I and D of Right leg and left breast Subjective: Reports pain worse in leg, breast feels better. Denies n/v. No bleeding from wound. Blood sugars still very h igh.   Objective: Vital signs in last 24 hours: Temp:  [97.4 F (36.3 C)-98.2 F (36.8 C)] 97.8 F (36.6 C) (10/29 0733) Pulse Rate:  [70-89] 70 (10/29 0733) Resp:  [11-22] 15 (10/29 0733) BP: (110-158)/(67-90) 158/90 (10/29 0733) SpO2:  [97 %-100 %] 98 % (10/29 0733) Last BM Date : 09/22/23 (per pt)  Intake/Output from previous day: 10/28 0701 - 10/29 0700 In: 680 [P.O.:480; I.V.:200] Out: 5 [Blood:5] Intake/Output this shift: No intake/output data recorded.  Physical exam:  Right leg dressing removed, there is an I and D site with clotted blood in cavity. Packing removed and I was able to evacuate some of the clot. There was no purulence expressed from the wound. There is still some induration surrounding the cavity. Overall wound appears to be healing well.   Left breast dressing removed. There is no bleeding, I was not able to express any further purulence from the wound. Some induration around wound but no fluctuance.   Both wounds packed with iodoform dressing and dressed with gauze and tape. Patient tolerated well.   Lab Results: CBC  Recent Labs    09/22/23 0427 09/24/23 0305  WBC 10.9* 11.5*  HGB 8.5* 8.4*  HCT 27.6* 26.2*  PLT 403* 415*   BMET Recent Labs    09/23/23 0838 09/24/23 0305  NA 130* 127*  K 4.3 5.3*  CL 99 97*  CO2 23 22  GLUCOSE 255* 475*  BUN 27* 38*  CREATININE 1.53* 1.62*  CALCIUM 8.5* 8.4*   PT/INR No results for input(s): "LABPROT", "INR" in the last 72 hours. ABG No results for input(s): "PHART", "HCO3" in the last 72 hours.  Invalid input(s): "PCO2", "PO2"  Studies/Results: No results found.  Anti-infectives: Anti-infectives (From admission, onward)    Start     Dose/Rate Route Frequency Ordered Stop   09/24/23 1000  cefTRIAXone (ROCEPHIN) 2 g in sodium  chloride 0.9 % 100 mL IVPB       Placed in "And" Linked Group   2 g 200 mL/hr over 30 Minutes Intravenous Every 24 hours 09/23/23 1709 09/28/23 0959   09/24/23 1000  vancomycin (VANCOREADY) IVPB 750 mg/150 mL        750 mg 150 mL/hr over 60 Minutes Intravenous Every 24 hours 09/24/23 0838     09/23/23 1730  metroNIDAZOLE (FLAGYL) IVPB 500 mg       Placed in "And" Linked Group   500 mg 100 mL/hr over 60 Minutes Intravenous Every 8 hours 09/23/23 1709 09/27/23 1759   09/21/23 1015  vancomycin (VANCOCIN) IVPB 1000 mg/200 mL premix  Status:  Discontinued        1,000 mg 200 mL/hr over 60 Minutes Intravenous Every 24 hours 09/21/23 0922 09/24/23 0838   09/21/23 1000  cefTRIAXone (ROCEPHIN) 2 g in sodium chloride 0.9 % 100 mL IVPB  Status:  Discontinued       Placed in "And" Linked Group   2 g 200 mL/hr over 30 Minutes Intravenous Every 24 hours 09/20/23 1304 09/20/23 1305   09/21/23 1000  metroNIDAZOLE (FLAGYL) IVPB 500 mg  Status:  Discontinued       Placed in "And" Linked Group   500 mg 100 mL/hr over 60 Minutes Intravenous Every 8 hours 09/20/23 1304 09/20/23 1305   09/21/23 1000  cefTRIAXone (ROCEPHIN) 2 g  in sodium chloride 0.9 % 100 mL IVPB  Status:  Discontinued       Placed in "And" Linked Group   2 g 200 mL/hr over 30 Minutes Intravenous Every 24 hours 09/20/23 1305 09/23/23 1709   09/21/23 1000  vancomycin (VANCOREADY) IVPB 750 mg/150 mL  Status:  Discontinued        750 mg 150 mL/hr over 60 Minutes Intravenous Every 24 hours 09/20/23 1413 09/21/23 0922   09/20/23 2300  metroNIDAZOLE (FLAGYL) IVPB 500 mg  Status:  Discontinued       Placed in "And" Linked Group   500 mg 100 mL/hr over 60 Minutes Intravenous Every 8 hours 09/20/23 1305 09/23/23 1709   09/20/23 0845  vancomycin (VANCOREADY) IVPB 2000 mg/400 mL  Status:  Discontinued        2,000 mg 200 mL/hr over 120 Minutes Intravenous  Once 09/20/23 0831 09/20/23 0838   09/20/23 0845  cefTRIAXone (ROCEPHIN) 2 g in sodium  chloride 0.9 % 100 mL IVPB        2 g 200 mL/hr over 30 Minutes Intravenous  Once 09/20/23 0831 09/20/23 0954   09/20/23 0845  vancomycin (VANCOCIN) IVPB 1000 mg/200 mL premix       Placed in "Followed by" Linked Group   1,000 mg 200 mL/hr over 60 Minutes Intravenous  Once 09/20/23 0838 09/20/23 1209   09/20/23 0845  vancomycin (VANCOCIN) IVPB 1000 mg/200 mL premix       Placed in "Followed by" Linked Group   1,000 mg 200 mL/hr over 60 Minutes Intravenous  Once 09/20/23 0838 09/20/23 1311       Assessment/Plan:  POD 1 from incision and drainage of right lower leg abscess and left breast abscess.  Both of these wounds appear to be healing well without any residual infection that may need debrided in the operating room.  I packed both wounds with iodoform and dressed with gauze.  Continue antibiotics.  She will need daily dressing changes.  Her blood sugars are quite high and the primary team is working on getting those in a more normal range.  Her leukocytosis is slightly worsened today but I think this is because of her surgery yesterday continue to trend this.  Baker Pierini, M.D. [facility]

## 2023-09-24 NOTE — Inpatient Diabetes Management (Addendum)
Inpatient Diabetes Program Recommendations  AACE/ADA: New Consensus Statement on Inpatient Glycemic Control (2015)  Target Ranges:  Prepandial:   less than 140 mg/dL      Peak postprandial:   less than 180 mg/dL (1-2 hours)      Critically ill patients:  140 - 180 mg/dL    Latest Reference Range & Units 09/23/23 07:42 09/23/23 11:32 09/23/23 13:44 09/23/23 16:22 09/23/23 17:00 09/23/23 20:13  Glucose-Capillary 70 - 99 mg/dL 161 (H)  8 units Novolog  220 (H)  5 units Novolog  164 (H) 160 (H)   5 mg Decadron @1539  192 (H)  9 units Novolog  385 (H)  5 units Novolog  30 units Semglee @2134    (H): Data is abnormally high  Latest Reference Range & Units 09/24/23 05:20 09/24/23 06:51  Glucose-Capillary 70 - 99 mg/dL 096 (HH)  10 units Novolog  483 (H)  (HH): Data is critically high (H): Data is abnormally high   Home DM Meds: Trulicity .75 mg qwk (NOT taking)      Novolog 0-9 units TID      Metformin 1000 mg BID     Current Orders: Semglee 30 units at bedtime                           Novolog 6 units TID with meals                           Novolog Moderate Correction Scale/ SSI (0-15 units) TID AC + HS    MD- Note pt received Decadron 5 mg X 1 dose yest for I&D CBGs last PM and early this AM severely elevated   Please consider:  1. Increase Semglee to 40 units at bedtime May also consider giving an extra 10 units Semglee X 1 dose this AM as well  2. Increase Novolog Meal Coverage to 10 units TID with meals  3. Pt requesting refills on her Freestyle Libre 2 [employer] (order number [id number])  --Pt may need more intensive Insulin regimen at home (only taking Novolog at home--used to take Lantus as well) --Her insurance will cover both Lantus and Novolog at $4 co-pay --Please consider restarting pt on Lantus at home when discharged--Pt is willing to restart Lantus    --Will follow patient during hospitalization--  Ambrose Finland RN, MSN,  CDCES Diabetes Coordinator Inpatient Glycemic Control Team Team Pager: [phone] (8a-5p)

## 2023-09-24 NOTE — Telephone Encounter (Signed)
Pharmacy Patient Advocate Encounter  Received notification from [facility] that Prior Authorization for Dexcom G7 Sensor  has been APPROVED from 09/23/2023 to 03/23/2024   PA #/Case ID/Reference #: [id number]

## 2023-09-24 NOTE — Telephone Encounter (Signed)
Pharmacy Patient Advocate Encounter  Received notification from [facility] that Prior Authorization for FreeStyle Libre 2 Sensor  has been APPROVED from 09/22/2024 to 03/22/2024. Ran test claim, Copay is $0.00. This test claim was processed through [facility]- copay amounts may vary at other pharmacies due to pharmacy/plan contracts, or as the patient moves through the different stages of their insurance plan.   PA #/Case ID/Reference #: [id number]

## 2023-09-24 NOTE — Consult Note (Signed)
Pharmacy Antibiotic Note  Theresa Malone is a 42 y.o. female admitted on 09/20/2023 with  worsening cellulitis / abscess of right lower leg . In review of chart, appears patient failed outpatient therapy with clindamycin. Wound culture from 09/13/23 showed GBS. Pharmacy has been consulted for vancomycin dosing.  Renal function trending up.  Plan: Reduce vancomycin to 750 mg q24H.  Goal AUC 400-600 Predicted AUC of 424.3, Cmin 11.9 Used Scr 1.62 Vd 0.5. IBW.   Height: 5\' 1"  (154.9 cm) Weight: 105.2 kg (232 lb) IBW/kg (Calculated) : 47.8  Temp (24hrs), Avg:97.9 F (36.6 C), Min:97.4 F (36.3 C), Max:98.2 F (36.8 C)  Recent Labs  Lab 09/19/23 2353 09/20/23 0504 09/21/23 0426 09/22/23 0427 09/23/23 0838 09/24/23 0305  WBC 11.4*  --  11.1* 10.9*  --  11.5*  CREATININE 1.60*  --  1.33* 1.62* 1.53* 1.62*  LATICACIDVEN  --  1.9  --   --   --   --     Estimated Creatinine Clearance: 52.1 mL/min (A) (by C-G formula based on SCr of 1.62 mg/dL (H)).    Allergies  Allergen Reactions   Sulfa Antibiotics Hives   Lisinopril Cough    cough    Antimicrobials this admission: Vancomycin 10/25 >>  Ceftriaxone 10/25 >>  Metronidazole 10/25 >>   Dose adjustments this admission: N/A  Microbiology results: 10/25 BCx: NG x 2 days 10/28 MRSA PCR: Not detected  Thank you for allowing pharmacy to be a part of this patient's care.  Elliot Gurney, PharmD, BCPS Clinical Pharmacist  09/24/2023 8:38 AM

## 2023-09-24 NOTE — Progress Notes (Addendum)
[facility] Progress Note  Patient: Theresa Malone    WUJ:[id number]  DOA: 09/20/2023     Date of Service: the patient was seen and examined on 09/24/2023  Chief Complaint  Patient presents with   Cellulitis   Brief hospital course: SEPIDEH ESCOTT is a 42 y.o. female with medical history significant of obesity, hidradenitis suppurativa, gout, hypertension, poorly controlled type 2 diabetes, venous stasis changes presenting with right lower extremity cellulitis.  Patient reports worsening right extremity wound over the past 1 to 2 weeks.  Was evaluated October 18 at primary care office.  Was placed on course of clindamycin for treatment.  Wound culture grew out strep agalactiae.  Noted prior history of MRSA infection per report.  Patient reports worsening symptoms despite treatment.  Blood sugars to 200s to 300s at home.  Positive generalized malaise.  No chest pain or shortness of breath.  No nausea or vomiting.  No reported alcohol or tobacco use. Presented to the ER afebrile, heart rate in 100s, BP stable.  Satting well on room air.  White count 11.4, hemoglobin 9.4, platelets 479, lactate 1.9, creatinine 1.6, glucose 472.  CT imaging with noted cutaneous wound at the mid shin with surrounding cellulitis.  No definitive abscess.   Assessment and Plan:  Hidradenitis and cellulitis of right lower extremity Multiple pustules on bilateral lower extremity  small abscess under left breast Worsening right lower extremity cellulitis despite treatment with outpatient course of clindamycin Noted October 18 wound culture growing strep agalactiae White count 11.4 CT imaging negative for abscess but is showing signs concerning for cellulitis Patient was started on IV Rocephin, Flagyl vancomycin for broad-spectrum coverage Pharmacy consulted for dosing and trough monitoring of vancomycin MRSA swab negative, blood culture NGTD Blood sugar control in setting of type 2 diabetes Continue as needed  medication for pain control S/p morphine IV, developed generalized itching, use Atarax and Benadryl as needed, Pepcid 20 mg IV x 2 doses order placed 10/28 d/w general surgery to squeeze right lower extremity fluid collection if possible. S/p I&D of left lower extremity fluid collection done on 10/28, continue dressing as per general surgery, follow fluid cultures  Left breast abscess Continue antibiotics Follow ultrasound left breast, done on 10/27 but report is still pending :) General Surgery consulted, s/p I&D done on 10/28  Follow fluid culture Continue dressing as per general surgery   Type II diabetes mellitus with renal manifestations Blood sugar in 400s without overt acidosis 10/29 increased Semglee 40 U nits BID  Continue NovoLog 6 units 3 times a day and sliding scale. Monitor CBG, continue diabetic diet A1c 11.4 uncontrolled Diabetic coordinator consulted for management and counseling. Consider discharging her on basal, long-acting insulin along with NovoLog sliding scale   Hypertension associated with diabetes Continue metoprolol 50 mg p.o. twice daily,  Monitor BP and titrate medication accordingly 10/29 d/c'd losartan 50 mg due to hyperkalemia Monitor BP and titrate medications accordingly  Hyperkalemia secondary to CKD Potassium 5.3, Lokelma 10 g one-time dose ordered Monitor potassium level daily    CKD (chronic kidney disease) stage 3, GFR 30-59 ml/min  Creatinine 1.6 with GFR in the 40s Appears near baseline Monitor  Pseudohyponatremia due to hyperglycemia Monitor sodium level daily  Iron deficiency Anemia, transferrin saturation 10% Started oral iron supplement Folic acid level 6.9, at lower end, started folic acid 1 mg p.o. daily to prevent deficiency Vitamin B12 level 375, goal >400, started oral supplement to prevent deficiency. Vitamin D deficiency: started vitamin  D 50,000 units p.o. weekly, follow with PCP to repeat vitamin D level after 3 to 6  months.   Morbid obesity Body mass index is 43.84 kg/m.  Nutrition Problem: Increased nutrient needs Etiology: wound healing Interventions: MVI, Liberalize Diet, Juven, Premier Protein Calorie restricted diet and daily exercise advised to lose body weight.  Lifestyle modification discussed.   Diet: Diabetic diet DVT Prophylaxis: Subcutaneous Lovenox   Advance goals of care discussion: Full code  Family Communication: family was not present at bedside, at the time of interview.  The pt provided permission to discuss medical plan with the family. Opportunity was given to ask question and all questions were answered satisfactorily.   Disposition:  Pt is from Home, admitted with right lower extremity cellulitis, multiple pustules and small abscess under left breast, still on IV antibiotics, which precludes a safe discharge. Discharge to home, when stable, may need few days to improve.  Subjective: No significant events overnight, patient still has significant pain in the right lower extremity which improved after pain medication given IV.  Changed to oral Dilaudid.  As per patient dressing was changed by general surgery, left breast abscess looks fine, no pain over there. Denied any other complaints.   Physical Exam: General: NAD, lying comfortably Appear in no distress, affect appropriate Eyes: PERRLA ENT: Oral Mucosa Clear, moist  Neck: no JVD,  Cardiovascular: S1 and S2 Present, no Murmur,  Respiratory: good respiratory effort, Bilateral Air entry equal and Decreased, no Crackles, no wheezes Abdomen: Bowel Sound present, Soft and no tenderness,  Skin: RLE abscess, s/p I&D and Left breast abscess s/p I&D, multiple macules and pustules on lower extremity improving. Extremities: RLE mid shin cellulitis, s/p I&D, dressing was soaked with dried blood, it was replaced by general surgery.   Neurologic: without any new focal findings Gait not checked due to patient safety  concerns  Vitals:   09/23/23 1659 09/23/23 2017 09/24/23 0516 09/24/23 0733  BP: 118/72 (!) 142/76 (!) 143/86 (!) 158/90  Pulse: 78 76 74 70  Resp: 16 18 18 15   Temp: 98.2 F (36.8 C) 98.1 F (36.7 C) (!) 97.4 F (36.3 C) 97.8 F (36.6 C)  TempSrc: Oral Oral Oral   SpO2: 97% 100% 100% 98%  Weight:      Height:        Intake/Output Summary (Last 24 hours) at 09/24/2023 1328 Last data filed at 09/23/2023 2018 Gross per 24 hour  Intake 680 ml  Output 5 ml  Net 675 ml   Filed Weights   09/19/23 2352  Weight: 105.2 kg    Data Reviewed: I have personally reviewed and interpreted daily labs, tele strips, imagings as discussed above. I reviewed all nursing notes, pharmacy notes, vitals, pertinent old records I have discussed plan of care as described above with RN and patient/family.  CBC: Recent Labs  Lab 09/19/23 2353 09/21/23 0426 09/22/23 0427 09/24/23 0305  WBC 11.4* 11.1* 10.9* 11.5*  NEUTROABS 8.2*  --   --   --   HGB 9.4* 8.6* 8.5* 8.4*  HCT 30.0* 27.6* 27.6* 26.2*  MCV 82.6 82.4 84.1 82.1  PLT 479* 384 403* 415*   Basic Metabolic Panel: Recent Labs  Lab 09/19/23 2353 09/21/23 0426 09/22/23 0427 09/23/23 0838 09/24/23 0305  NA 126* 130* 130* 130* 127*  K 5.0 4.4 4.5 4.3 5.3*  CL 93* 95* 99 99 97*  CO2 23 26 23 23 22   GLUCOSE 472* 282* 335* 255* 475*  BUN 25* 23* 27*  27* 38*  CREATININE 1.60* 1.33* 1.62* 1.53* 1.62*  CALCIUM 8.8* 8.9 8.6* 8.5* 8.4*  MG  --   --  2.1  --  2.3  PHOS  --   --  4.0  --  3.5    Studies: No results found.  Scheduled Meds:  vitamin B-12  1,000 mcg Oral Daily   enoxaparin (LOVENOX) injection  50 mg Subcutaneous Q24H   escitalopram  40 mg Oral Daily   feeding supplement  237 mL Oral QHS   folic acid  1 mg Oral Daily   gabapentin  600 mg Oral BID   hydrocerin   Topical BID   insulin aspart  0-15 Units Subcutaneous TID WC   insulin aspart  0-5 Units Subcutaneous QHS   insulin aspart  6 Units Subcutaneous TID WC    insulin glargine-yfgn  40 Units Subcutaneous BID   iron polysaccharides  150 mg Oral Daily   melatonin  5 mg Oral QHS   metoprolol tartrate  50 mg Oral BID   multivitamin with minerals  1 tablet Oral Daily   nutrition supplement (JUVEN)  1 packet Oral BID BM   sodium chloride flush  10 mL Intravenous Q12H   Continuous Infusions:  cefTRIAXone (ROCEPHIN)  IV 2 g (09/24/23 1000)   And   metronidazole 500 mg (09/24/23 1050)   vancomycin 750 mg (09/24/23 1003)   PRN Meds: acetaminophen, cyclobenzaprine, diphenhydrAMINE, HYDROmorphone, hydrOXYzine, ondansetron **OR** ondansetron (ZOFRAN) IV  Time spent: 55 minutes  Author: Gillis Santa. MD [facility] 09/24/2023 1:28 PM  To reach On-call, see care teams to locate the attending and reach out to them via [email or web address]. If 7PM-7AM, please contact night-coverage If you still have difficulty reaching the attending provider, please page the [facility] (Director on Call) for [facility] on amion for assistance.

## 2023-09-25 DIAGNOSIS — E1121 Type 2 diabetes mellitus with diabetic nephropathy: Secondary | ICD-10-CM

## 2023-09-25 LAB — BASIC METABOLIC PANEL
Anion gap: 7 (ref 5–15)
BUN: 42 mg/dL — ABNORMAL HIGH (ref 6–20)
CO2: 23 mmol/L (ref 22–32)
Calcium: 8.4 mg/dL — ABNORMAL LOW (ref 8.9–10.3)
Chloride: 102 mmol/L (ref 98–111)
Creatinine, Ser: 1.6 mg/dL — ABNORMAL HIGH (ref 0.44–1.00)
GFR, Estimated: 42 mL/min — ABNORMAL LOW (ref >60)
Glucose, Bld: 260 mg/dL — ABNORMAL HIGH (ref 70–99)
Potassium: 4.5 mmol/L (ref 3.5–5.1)
Sodium: 132 mmol/L — ABNORMAL LOW (ref 135–145)

## 2023-09-25 LAB — CBC
HCT: 24.6 % — ABNORMAL LOW (ref 36.0–46.0)
Hemoglobin: 7.6 g/dL — ABNORMAL LOW (ref 12.0–15.0)
MCH: 25.9 pg — ABNORMAL LOW (ref 26.0–34.0)
MCHC: 30.9 g/dL (ref 30.0–36.0)
MCV: 84 fL (ref 80.0–100.0)
Platelets: 417 10*3/uL — ABNORMAL HIGH (ref 150–400)
RBC: 2.93 MIL/uL — ABNORMAL LOW (ref 3.87–5.11)
RDW: 14.9 % (ref 11.5–15.5)
WBC: 12.5 10*3/uL — ABNORMAL HIGH (ref 4.0–10.5)
nRBC: 0 % (ref 0.0–0.2)

## 2023-09-25 LAB — CULTURE, BLOOD (ROUTINE X 2)
Culture: NO GROWTH
Culture: NO GROWTH
Special Requests: ADEQUATE

## 2023-09-25 LAB — AEROBIC CULTURE W GRAM STAIN (SUPERFICIAL SPECIMEN): Gram Stain: NONE SEEN

## 2023-09-25 LAB — SURGICAL PATHOLOGY

## 2023-09-25 LAB — PHOSPHORUS: Phosphorus: 4 mg/dL (ref 2.5–4.6)

## 2023-09-25 LAB — GLUCOSE, CAPILLARY
Glucose-Capillary: 263 mg/dL — ABNORMAL HIGH (ref 70–99)
Glucose-Capillary: 248 mg/dL — ABNORMAL HIGH (ref 70–99)

## 2023-09-25 LAB — MAGNESIUM: Magnesium: 2.3 mg/dL (ref 1.7–2.4)

## 2023-09-25 MED ORDER — AMLODIPINE BESYLATE 5 MG PO TABS
5.0000 mg | ORAL_TABLET | Freq: Every day | ORAL | Status: DC
  Administered 2023-09-25: 5 mg via ORAL
  Filled 2023-09-25: qty 1

## 2023-09-25 MED ORDER — ADULT MULTIVITAMIN W/MINERALS CH: 1.0000 | ORAL_TABLET | Freq: Every day | ORAL | 0 refills | Status: DC

## 2023-09-25 MED ORDER — AMOXICILLIN-POT CLAVULANATE ER 1000-62.5 MG PO TB12: 2.0000 | ORAL_TABLET | Freq: Two times a day (BID) | ORAL | 0 refills | Status: AC

## 2023-09-25 MED ORDER — CYANOCOBALAMIN 1000 MCG PO TABS: 1000.0000 ug | ORAL_TABLET | Freq: Every day | ORAL | 0 refills | Status: DC

## 2023-09-25 MED ORDER — HYDROMORPHONE HCL 2 MG PO TABS
2.0000 mg | ORAL_TABLET | Freq: Once | ORAL | Status: AC
  Administered 2023-09-25: 2 mg via ORAL
  Filled 2023-09-25: qty 1

## 2023-09-25 MED ORDER — HYDROMORPHONE HCL 1 MG/ML IJ SOLN: 1.0000 mg | Freq: Once | INTRAMUSCULAR | Status: DC

## 2023-09-25 MED ORDER — INSULIN GLARGINE-YFGN 100 UNIT/ML ~~LOC~~ SOLN: 40.0000 [IU] | Freq: Two times a day (BID) | SUBCUTANEOUS | 11 refills | Status: DC

## 2023-09-25 MED ORDER — LINEZOLID 600 MG PO TABS: 600.0000 mg | ORAL_TABLET | Freq: Two times a day (BID) | ORAL | 0 refills | Status: AC

## 2023-09-25 MED ORDER — VITAMIN D (ERGOCALCIFEROL) 1.25 MG (50000 UNIT) PO CAPS: 50000.0000 [IU] | ORAL_CAPSULE | ORAL | 0 refills | Status: DC

## 2023-09-25 MED ORDER — FOLIC ACID 1 MG PO TABS: 1.0000 mg | ORAL_TABLET | Freq: Every day | ORAL | 0 refills | Status: DC

## 2023-09-25 MED ORDER — AMLODIPINE BESYLATE 5 MG PO TABS: 5.0000 mg | ORAL_TABLET | Freq: Every day | ORAL | 0 refills | Status: DC

## 2023-09-25 MED ORDER — POLYSACCHARIDE IRON COMPLEX 150 MG PO CAPS: 150.0000 mg | ORAL_CAPSULE | Freq: Every day | ORAL | 0 refills | Status: AC

## 2023-09-25 MED ORDER — HYDROMORPHONE HCL 2 MG PO TABS: 2.0000 mg | ORAL_TABLET | Freq: Four times a day (QID) | ORAL | 0 refills | Status: AC | PRN

## 2023-09-25 NOTE — Discharge Summary (Signed)
Physician Discharge Summary   Patient: Theresa Malone MRN: [id number] DOB: 1980/12/22  Admit date:     09/20/2023  Discharge date: 09/25/23  Discharge Physician: Alba Cory   PCP: [name], PA-C   Recommendations at discharge:   Needs follow up with General surgery for further care of wounds.  Needs Bmet to follow renal function.   Discharge Diagnoses: Active Problems:   Cellulitis of right lower extremity   Type II diabetes mellitus with renal manifestations (HCC)   Hypertension associated with diabetes (HCC)   CKD (chronic kidney disease) stage 3, GFR 30-59 ml/min (HCC)   Cellulitis   Breast abscess   Cellulitis and abscess of leg  Resolved Problems:   * No resolved hospital problems. *  Hospital Course: 42 year old with past medical history significant for obesity, hidradenitis suppurativa, gout, hypertension, poorly controlled diabetes type 2, venous stasis changes right lower extremity cellulitis, report worsening right extremity wound over the past 1 or 2 weeks.  She completed a course of clindamycin.  Wound culture grew out strep Adalat TI.  She has a prior history of MRSA infection.  Presented with hyperglycemia and worsening wound infection.  CT imaging noted cutaneous wound  at mid  shin with surrounding cellulitis.  No abscess      Assessment and Plan: Hidradenitis and cellulitis of right lower extremity Multiple pustules on bilateral lower extremity  small abscess under left breast -Failed outpatient treatment with clindamycin -S/p I&D of the left lower extremity fluid collection done on 10/28 continue dressing as per general surgery recommendation -Culture wound grew staph epidermidis Resistant to clinda, she will be discharge on Linezolid, allergy to Bactrim. Also grew Sterp B Agalactae. Discharge on Augmentin.   Left breast abscess Continue antibiotics Follow ultrasound left breast, done on 10/27 but report is still pending :) General Surgery  consulted, s/p I&D done on 10/28  Follow fluid culture no growth.  Continue dressing as per general surgery  Discharge on Augmentin and Linezolid for 7 days.  Clear for discharge per surgery.   Type II diabetes mellitus with renal manifestations Discharge on SSI. And Semglee 40 units BID> Blood sugar better controlled than yesterday.   Hypertension associated with diabetes Continue metoprolol 50 mg p.o. twice daily,  Monitor BP and titrate medication accordingly 10/29 d/c'd losartan 50 mg due to hyperkalemia Started low dose Norvasc.    Hyperkalemia secondary to CKD Potassium 5.3, Lokelma 10 g one-time dose ordered Resolved. Hold Arb at discharge     CKD (chronic kidney disease) stage 3b, GFR 30-59 ml/min  Creatinine 1.6 with GFR in the 40s Previous Cr 1.4 Stable last several days at 1.6   Pseudohyponatremia due to hyperglycemia Monitor sodium level daily   Iron deficiency Anemia, transferrin saturation 10% Started oral iron supplement Folic acid level 6.9, at lower end, started folic acid 1 mg p.o. daily to prevent deficiency Vitamin B12 level 375, goal >400, started oral supplement to prevent deficiency. Vitamin D deficiency: started vitamin D 50,000 units p.o. weekly, follow with PCP to repeat vitamin D level after 3 to 6 months.     Morbid obesity Body mass index is 43.84 kg/m.  Nutrition Problem: Increased nutrient needs Etiology: wound healing Interventions: MVI, Liberalize Diet, Juven, Premier Protein Calorie restricted diet and daily exercise advised to lose body weight.  Lifestyle modification discussed.         Consultants: Surgery  Procedures performed: I and D  Disposition: Home Diet recommendation:  Discharge Diet Orders (From admission, onward)  Start     Ordered   09/25/23 0000  Diet - low sodium heart healthy        09/25/23 1441           Carb modified diet DISCHARGE MEDICATION: Allergies as of 09/25/2023       Reactions   Sulfa  Antibiotics Hives   Lisinopril Cough   cough        Medication List     STOP taking these medications    clindamycin 300 MG capsule Commonly known as: Cleocin   losartan-hydrochlorothiazide 50-12.5 MG tablet Commonly known as: HYZAAR   metFORMIN 1000 MG tablet Commonly known as: GLUCOPHAGE   naproxen sodium 220 MG tablet Commonly known as: ALEVE   Trulicity 0.75 MG/0.5ML Soaj Generic drug: Dulaglutide       TAKE these medications    acetaminophen 650 MG CR tablet Commonly known as: TYLENOL Take 1 tablet (650 mg total) by mouth every 8 (eight) hours as needed for pain.   amLODipine 5 MG tablet Commonly known as: NORVASC Take 1 tablet (5 mg total) by mouth daily.   amoxicillin-clavulanate 1000-62.5 MG 12 hr tablet Commonly known as: AUGMENTIN XR Take 2 tablets by mouth 2 (two) times daily for 7 days.   BD Pen Needle Micro U/F 32G X 6 MM Misc Generic drug: Insulin Pen Needle Inject into the skin daily.   clindamycin-benzoyl peroxide gel Commonly known as: BenzaClin Apply topically 2 (two) times daily.   cyanocobalamin 1000 MCG tablet Take 1 tablet (1,000 mcg total) by mouth daily. Start taking on: September 26, 2023   cyclobenzaprine 5 MG tablet Commonly known as: FLEXERIL Take 1 tablet by mouth three times daily as needed for muscle spasm   Dexcom G7 Sensor Misc Change every 10 days use to continuously monitor blood sugar   escitalopram 20 MG tablet Commonly known as: LEXAPRO Take 40 mg by mouth daily.   folic acid 1 MG tablet Commonly known as: FOLVITE Take 1 tablet (1 mg total) by mouth daily. Start taking on: September 26, 2023   gabapentin 300 MG capsule Commonly known as: NEURONTIN Take 2 capsules by mouth twice daily   HYDROmorphone 2 MG tablet Commonly known as: DILAUDID Take 1 tablet (2 mg total) by mouth every 6 (six) hours as needed for up to 3 days for moderate pain (pain score 4-6) or severe pain (pain score 7-10).   insulin  glargine-yfgn 100 UNIT/ML injection Commonly known as: SEMGLEE Inject 0.4 mLs (40 Units total) into the skin 2 (two) times daily.   iron polysaccharides 150 MG capsule Commonly known as: NIFEREX Take 1 capsule (150 mg total) by mouth daily. Start taking on: September 26, 2023   linezolid 600 MG tablet Commonly known as: ZYVOX Take 1 tablet (600 mg total) by mouth 2 (two) times daily for 7 days.   metoprolol tartrate 50 MG tablet Commonly known as: LOPRESSOR Take 1 tablet (50 mg total) by mouth 2 (two) times daily.   multivitamin with minerals Tabs tablet Take 1 tablet by mouth daily. Start taking on: September 26, 2023   NovoLOG FlexPen 100 UNIT/ML FlexPen Generic drug: insulin aspart CBG 70 - 120: 0 units CBG 121 - 150: 1 unit CBG 151 - 200: 2 units CBG 201 - 250: 3 units CBG 251 - 300: 5 units CBG 301 - 350: 7 units CBG 351 - 400: 9 units CBG > 400: call MD   Vitamin D (Ergocalciferol) 1.25 MG (50000 UNIT) Caps capsule Commonly known  as: DRISDOL Take 1 capsule (50,000 Units total) by mouth every 7 (seven) days. Start taking on: October 01, 2023               Discharge Care Instructions  (From admission, onward)           Start     Ordered   09/25/23 0000  Discharge wound care:       Comments: Daily dry dressing to both sites, right leg and left breast. No need to pack   09/25/23 1001   09/25/23 0000  Discharge wound care:       Comments: See above   09/25/23 1441            Follow-up Information     Ostwalt, Janna, PA-C Follow up.   Specialty: Physician Assistant Why: Hospital follow up Contact information: [address] [place] [address] [phone]         Donovan Kail, PA-C Follow up in 2 week(s).   Specialty: Physician Assistant Contact information: [address] [place] [address] [phone]                Discharge Exam: Ceasar Mons Weights   09/19/23 2352  Weight: 105.2 kg   General.  NAD  Condition at discharge: stable  The results of significant diagnostics from this hospitalization (including imaging, microbiology, ancillary and laboratory) are listed below for reference.   Imaging Studies: CT TIBIA FIBULA RIGHT W CONTRAST  Result Date: 09/20/2023 CLINICAL DATA:  Cellulitis of the right lower leg. Treatment with antibiotics. Wound continues to drain. EXAM: CT OF THE LOWER RIGHT EXTREMITY WITH CONTRAST TECHNIQUE: Multidetector CT imaging of the lower right extremity was performed according to the standard protocol following intravenous contrast administration. RADIATION DOSE REDUCTION: This exam was performed according to the departmental dose-optimization program which includes automated exposure control, adjustment of the mA and/or kV according to patient size and/or use of iterative reconstruction technique. CONTRAST:  70mL OMNIPAQUE IOHEXOL 300 MG/ML  SOLN COMPARISON:  None Available. FINDINGS: Bones/Joint/Cartilage No fracture or dislocation. Normal alignment. No joint effusion. Ligaments Ligaments are suboptimally evaluated by CT. Muscles and Tendons Muscles are normal. No intramuscular fluid collection or hematoma. Soft tissue There is a cutaneous wound at the anteromedial aspect of the mid shin with surrounding skin thickening and underlying subcutaneous edema. No discrete fluid collection identified. No soft tissue gas. Diffuse subcutaneous edema extends distally from the level of the wound to the ankle. IMPRESSION: 1. Cutaneous wound at the right anteromedial mid shin with surrounding cellulitis. No fluid collection identified. No soft tissue gas. 2. No acute osseous abnormality. Electronically Signed   By: Hart Robinsons M.D.   On: 09/20/2023 10:45   US Venous Img Lower Unilateral Right  Result Date: 09/20/2023 CLINICAL DATA:  Right lower extremity swelling, pain, and edema. Recently diagnosed with cellulitis and just finished antibiotics. EXAM: Right LOWER EXTREMITY  VENOUS DOPPLER ULTRASOUND TECHNIQUE: Gray-scale sonography with compression, as well as color and duplex ultrasound, were performed to evaluate the deep venous system(s) from the level of the common femoral vein through the popliteal and proximal calf veins. COMPARISON:  None Available. FINDINGS: VENOUS Normal compressibility of the common femoral, superficial femoral, and popliteal veins, as well as the visualized calf veins. Visualized portions of profunda femoral vein and great saphenous vein unremarkable. No filling defects to suggest DVT on grayscale or color Doppler imaging. Doppler waveforms show normal direction of venous flow, normal respiratory plasticity and response to augmentation. Limited  views of the contralateral common femoral vein are unremarkable. OTHER None. Limitations: none IMPRESSION: No evidence of acute deep venous thrombosis in the visualized lower extremity veins. Electronically Signed   By: Burman Nieves M.D.   On: 09/20/2023 02:14    Microbiology: Results for orders placed or performed during the hospital encounter of 09/20/23  Blood culture (routine x 2)     Status: None   Collection Time: 09/20/23  5:04 AM   Specimen: BLOOD LEFT ARM  Result Value Ref Range Status   Specimen Description BLOOD LEFT ARM  Final   Special Requests   Final    BOTTLES DRAWN AEROBIC AND ANAEROBIC Blood Culture results may not be optimal due to an excessive volume of blood received in culture bottles   Culture   Final    NO GROWTH 5 DAYS Performed at [facility], [address] [place], [place] [address]    Report Status 09/25/2023 FINAL  Final  Blood culture (routine x 2)     Status: None   Collection Time: 09/20/23  2:25 PM   Specimen: BLOOD  Result Value Ref Range Status   Specimen Description BLOOD BLOOD LEFT HAND  Final   Special Requests   Final    BOTTLES DRAWN AEROBIC AND ANAEROBIC Blood Culture adequate volume   Culture   Final    NO GROWTH 5 DAYS Performed at  [facility], [address] [place], [place] [address]    Report Status 09/25/2023 FINAL  Final  Aerobic Culture w Gram Stain (superficial specimen)     Status: None   Collection Time: 09/20/23  3:15 PM   Specimen: Wound  Result Value Ref Range Status   Specimen Description   Final    WOUND Performed at [facility], [address] [place], [place] [address]    Special Requests   Final    SKIN Performed at [facility], [address] [place], [place] [address]    Gram Stain   Final    NO WBC SEEN NO ORGANISMS SEEN Performed at [facility], [address] [place], [place] [address]    Culture RARE STAPHYLOCOCCUS EPIDERMIDIS  Final   Report Status 09/25/2023 FINAL  Final   Organism ID, Bacteria STAPHYLOCOCCUS EPIDERMIDIS  Final      Susceptibility   Staphylococcus epidermidis - MIC*    CIPROFLOXACIN <=0.5 SENSITIVE Sensitive     ERYTHROMYCIN >=8 RESISTANT Resistant     GENTAMICIN <=0.5 SENSITIVE Sensitive     OXACILLIN >=4 RESISTANT Resistant     TETRACYCLINE >=16 RESISTANT Resistant     VANCOMYCIN 2 SENSITIVE Sensitive     TRIMETH/SULFA <=10 SENSITIVE Sensitive     CLINDAMYCIN >=8 RESISTANT Resistant     RIFAMPIN <=0.5 SENSITIVE Sensitive     Inducible Clindamycin NEGATIVE Sensitive     * RARE STAPHYLOCOCCUS EPIDERMIDIS  MRSA Next Gen by PCR, Nasal     Status: None   Collection Time: 09/23/23  3:55 AM   Specimen: Nasal Mucosa; Nasal Swab  Result Value Ref Range Status   MRSA by PCR Next Gen NOT DETECTED NOT DETECTED Final    Comment: (NOTE) The GeneXpert MRSA Assay (FDA approved for NASAL specimens only), is one component of a comprehensive MRSA colonization surveillance program. It is not intended to diagnose MRSA infection nor to guide or monitor treatment for MRSA infections. Test performance is not FDA approved in patients less than 41 years old. Performed at [facility], [phone]  [address] [place], [place]  [address]   Aerobic/Anaerobic Culture w Gram Stain (surgical/deep wound)     Status: None (Preliminary result)   Collection Time: 09/23/23  4:15 PM   Specimen: Abscess  Result Value Ref Range Status   Specimen Description   Final    BREAST LEFT Performed at [facility], [address], [place], [place] [address]    Special Requests   Final    ABSC Performed at [facility], [address] [place], [place] [address]    Gram Stain   Final    ABUNDANT WBC PRESENT, PREDOMINANTLY PMN RARE GRAM NEGATIVE RODS    Culture   Final    NO GROWTH 2 DAYS NO ANAEROBES ISOLATED; CULTURE IN PROGRESS FOR 5 DAYS Performed at [facility], [address], [place], [place] [address]    Report Status PENDING  Incomplete  Aerobic/Anaerobic Culture w Gram Stain (surgical/deep wound)     Status: None (Preliminary result)   Collection Time: 09/23/23  4:19 PM   Specimen: Abscess  Result Value Ref Range Status   Specimen Description   Final    ABSCESS Performed at [facility], [address] [place], [place] [address]    Special Requests   Final    RT LOWER LEG Performed at [facility], [address] [place], [place] [address]    Gram Stain   Final    FEW WBC PRESENT, PREDOMINANTLY MONONUCLEAR RARE GRAM POSITIVE COCCI IN PAIRS Performed at [facility], [address] [place], [place] [address]    Culture   Final    FEW GROUP B STREP(S.AGALACTIAE)ISOLATED TESTING AGAINST S. AGALACTIAE NOT ROUTINELY PERFORMED DUE TO PREDICTABILITY OF AMP/PEN/VAN SUSCEPTIBILITY. NO ANAEROBES ISOLATED; CULTURE IN PROGRESS FOR 5 DAYS    Report Status PENDING  Incomplete    Labs: CBC: Recent Labs  Lab 09/19/23 2353 09/21/23 0426 09/22/23 0427 09/24/23 0305 09/25/23 0405  WBC 11.4* 11.1* 10.9* 11.5* 12.5*  NEUTROABS 8.2*  --   --   --   --   HGB 9.4* 8.6* 8.5* 8.4* 7.6*  HCT 30.0* 27.6* 27.6* 26.2* 24.6*  MCV 82.6 82.4 84.1 82.1 84.0  PLT 479* 384 403*  415* 417*   Basic Metabolic Panel: Recent Labs  Lab 09/21/23 0426 09/22/23 0427 09/23/23 0838 09/24/23 0305 09/25/23 0405  NA 130* 130* 130* 127* 132*  K 4.4 4.5 4.3 5.3* 4.5  CL 95* 99 99 97* 102  CO2 26 23 23 22 23   GLUCOSE 282* 335* 255* 475* 260*  BUN 23* 27* 27* 38* 42*  CREATININE 1.33* 1.62* 1.53* 1.62* 1.60*  CALCIUM 8.9 8.6* 8.5* 8.4* 8.4*  MG  --  2.1  --  2.3 2.3  PHOS  --  4.0  --  3.5 4.0   Liver Function Tests: Recent Labs  Lab 09/21/23 0426  AST 14*  ALT 14  ALKPHOS 146*  BILITOT 0.6  PROT 7.9  ALBUMIN 3.1*   CBG: Recent Labs  Lab 09/24/23 1438 09/24/23 1621 09/24/23 2109 09/25/23 0824 09/25/23 1118  GLUCAP 412* 293* 164* 263* 248*    Discharge time spent: greater than 30 minutes.  Signed: Alba Cory, MD [facility] 09/25/2023

## 2023-09-25 NOTE — Progress Notes (Addendum)
POD # 2 Doing well Anxious Path reviewed no breast cancer  PE NAD Alert Wounds healing well w/o infection  A/P doing well w/o complication F/u as outpt Continue outpt a/bs

## 2023-09-25 NOTE — Inpatient Diabetes Management (Signed)
Inpatient Diabetes Program Recommendations  AACE/ADA: New Consensus Statement on Inpatient Glycemic Control (2015)  Target Ranges:  Prepandial:   less than 140 mg/dL      Peak postprandial:   less than 180 mg/dL (1-2 hours)      Critically ill patients:  140 - 180 mg/dL   Lab Results  Component Value Date   GLUCAP 248 (H) 09/25/2023   HGBA1C 11.4 (H) 09/21/2023    Review of Glycemic Control  Diabetes history: DM2 Outpatient Diabetes medications:  Trulicity 0.75 mg weekly Novolog 0-9 units TID Metformin 1000 mg BID Current orders for Inpatient glycemic control:  Semglee 40 units every day Novolog 0-20 units TID and 0-5 units at bedtime Novolog 15 units TID  Inpatient Diabetes Program Recommendations:    Semglee 45 units every day  Will continue to follow while inpatient.  Thank you, Dulce Sellar, MSN, CDCES Diabetes Coordinator Inpatient Diabetes Program [phone] (team pager from 8a-5p)

## 2023-09-25 NOTE — Plan of Care (Signed)
  Problem: Education: Goal: Knowledge of General Education information will improve Description: Including pain rating scale, medication(s)/side effects and non-pharmacologic comfort measures Outcome: Progressing   Problem: Health Behavior/Discharge Planning: Goal: Ability to manage health-related needs will improve Outcome: Progressing   Problem: Clinical Measurements: Goal: Ability to maintain clinical measurements within normal limits will improve Outcome: Progressing Goal: Will remain free from infection Outcome: Progressing Goal: Diagnostic test results will improve Outcome: Progressing Goal: Respiratory complications will improve Outcome: Progressing Goal: Cardiovascular complication will be avoided Outcome: Progressing   Problem: Activity: Goal: Risk for activity intolerance will decrease Outcome: Progressing   Problem: Nutrition: Goal: Adequate nutrition will be maintained Outcome: Progressing   Problem: Coping: Goal: Level of anxiety will decrease Outcome: Progressing   Problem: Elimination: Goal: Will not experience complications related to bowel motility Outcome: Progressing Goal: Will not experience complications related to urinary retention Outcome: Progressing   Problem: Pain Management: Goal: General experience of comfort will improve Outcome: Progressing   Problem: Safety: Goal: Ability to remain free from injury will improve Outcome: Progressing   Problem: Skin Integrity: Goal: Risk for impaired skin integrity will decrease Outcome: Progressing   Problem: Education: Goal: Ability to describe self-care measures that may prevent or decrease complications (Diabetes Survival Skills Education) will improve Outcome: Progressing Goal: Individualized Educational Video(s) Outcome: Progressing   Problem: Coping: Goal: Ability to adjust to condition or change in health will improve Outcome: Progressing   Problem: Fluid Volume: Goal: Ability to  maintain a balanced intake and output will improve Outcome: Progressing   Problem: Health Behavior/Discharge Planning: Goal: Ability to identify and utilize available resources and services will improve Outcome: Progressing Goal: Ability to manage health-related needs will improve Outcome: Progressing   Problem: Metabolic: Goal: Ability to maintain appropriate glucose levels will improve Outcome: Progressing   Problem: Nutritional: Goal: Maintenance of adequate nutrition will improve Outcome: Progressing Goal: Progress toward achieving an optimal weight will improve Outcome: Progressing   Problem: Skin Integrity: Goal: Risk for impaired skin integrity will decrease Outcome: Progressing   Problem: Tissue Perfusion: Goal: Adequacy of tissue perfusion will improve Outcome: Progressing   Problem: Clinical Measurements: Goal: Ability to avoid or minimize complications of infection will improve Outcome: Progressing   Problem: Skin Integrity: Goal: Skin integrity will improve Outcome: Progressing

## 2023-09-26 ENCOUNTER — Other Ambulatory Visit (HOSPITAL_COMMUNITY): Payer: Self-pay

## 2023-09-26 ENCOUNTER — Ambulatory Visit (INDEPENDENT_AMBULATORY_CARE_PROVIDER_SITE_OTHER): Payer: MEDICAID | Admitting: Dermatology

## 2023-09-26 ENCOUNTER — Encounter: Payer: Self-pay | Admitting: Dermatology

## 2023-09-26 ENCOUNTER — Telehealth: Payer: Self-pay

## 2023-09-26 DIAGNOSIS — Z7189 Other specified counseling: Secondary | ICD-10-CM

## 2023-09-26 DIAGNOSIS — L03115 Cellulitis of right lower limb: Secondary | ICD-10-CM | POA: Diagnosis not present

## 2023-09-26 DIAGNOSIS — T148XXA Other injury of unspecified body region, initial encounter: Secondary | ICD-10-CM

## 2023-09-26 DIAGNOSIS — L732 Hidradenitis suppurativa: Secondary | ICD-10-CM | POA: Diagnosis not present

## 2023-09-26 NOTE — Progress Notes (Signed)
New Patient Visit   Subjective  Theresa Malone is a 42 y.o. female who presents for the following: HS inframammary, axilla, buttocks, 25 yrs, in past Doxycycline, Amoxicillin, I&D L breast 09/23/23 Cellulitis R lower leg, I&D 09/23/23 pt was prescribed but not started Linezolid and Augmentin XR  d/c from hospital 09/25/23 The patient has spots, moles and lesions to be evaluated, some may be new or changing and the patient may have concern these could be cancer.   The following portions of the chart were reviewed this encounter and updated as appropriate: medications, allergies, medical history  Review of Systems:  No other skin or systemic complaints except as noted in HPI or Assessment and Plan.  Objective  Well appearing patient in no apparent distress; mood and affect are within normal limits.   A focused examination was performed of the following areas: Inframammary, axilla, right leg  Relevant exam findings are noted in the Assessment and Plan.    Assessment & Plan   CELLULITIS s/p debridement while inpatient R lower leg Exam: broad deep ulceration from surgical debridement on right lower leg  Treatment Plan: Reviewed discharge summary by Dr Sunnie Nielsen  Start Linezolid and Augmentin XR as prescribed by hospital Sent referral to wound care and pain clinic Wound cleansed with Puracyn followed by telfa and coban. Discussed that poorly controlled diabetes (that is causing chronic hyperglycemia, immunosuppression, and peripheral vascular disease) likely contributed to severe infection on lower leg. Diabetes must be better controlled to reduce these sequelae. Worst case scenario is uncontrollable infection that leads to sepsis and/or amputation, which would significantly harm quality of life, ability to exercise/work, independence. Patient has missed appointments with PCP due to life circumstances but understands that they are critical for diabetes control  HIDRADENITIS  SUPPURATIVA Inframammary, axilla, buttocks Exam: inframammary, bil axilla draining sinus tracks. Deep ulceration from surgical debridement on left inferior breast  Chronic and persistent condition with duration or expected duration over one year. Condition is bothersome/symptomatic for patient. Currently flared.   Hidradenitis Suppurativa is a chronic; persistent; non-curable, but treatable condition due to abnormal inflamed sweat glands in the body folds (axilla, inframammary, groin, medial thighs), causing recurrent painful draining cysts and scarring. It can be associated with severe scarring acne and cysts; also abscesses and scarring of scalp. The goal is control and prevention of flares, as it is not curable. Scars are permanent and can be thickened. Treatment may include daily use of topical medication and oral antibiotics.  Oral isotretinoin may also be helpful.  For some cases, Humira or Cosentyx (biologic injections) may be prescribed to decrease the inflammatory process and prevent flares.  When indicated, inflamed cysts may also be treated surgically.  Treatment Plan: Plan possible biologic pending labs; cannot start until cellulitis has been treated 5 unique tests ordered May consider Doxycyline once pt finished abx for Cellulitis I&D site L inframammary packing removed, wound cleansed with Puracyn followed by bandage Sent referral to wound care as above  Assessment & Plan   Hidradenitis suppurativa  Related Procedures Ambulatory referral to Pain Clinic Hepatitis B surface antibody,qualitative Hepatitis B surface antigen Hepatitis C antibody HIV Antibody (routine testing w rflx) QuantiFERON-TB Gold Plus  Related Medications clindamycin-benzoyl peroxide (BENZACLIN) gel Apply topically 2 (two) times daily.  Cellulitis of right lower extremity  Related Procedures Ambulatory referral to Wound Clinic  Surgical wound present  Related Procedures Ambulatory referral to  Wound Clinic    Return in about 1 month (around 10/26/2023) for HS f/u.  I, Ardis Rowan, RMA, am acting as scribe for Elie Goody, MD .   Documentation: I have reviewed the above documentation for accuracy and completeness, and I agree with the above.  Elie Goody, MD

## 2023-09-26 NOTE — Transitions of Care (Post Inpatient/ED Visit) (Signed)
09/26/2023  Name: Theresa Malone MRN: [id number] DOB: 1981/08/31  Today's TOC FU Call Status: Today's TOC FU Call Status:: Unsuccessful Call (1st Attempt) Unsuccessful Call (1st Attempt) Date: 09/26/23  Attempted to reach the patient regarding the most recent Inpatient/ED visit.  Follow Up Plan: No further outreach attempts will be made at this time. We have been unable to contact the patient.  Deidre Ala, RN Medical illustrator [facility] [phone]

## 2023-09-26 NOTE — Patient Instructions (Signed)

## 2023-09-28 LAB — AEROBIC/ANAEROBIC CULTURE W GRAM STAIN (SURGICAL/DEEP WOUND): Culture: NO GROWTH

## 2023-09-29 ENCOUNTER — Encounter: Payer: Self-pay | Admitting: Dermatology

## 2023-09-30 ENCOUNTER — Ambulatory Visit (INDEPENDENT_AMBULATORY_CARE_PROVIDER_SITE_OTHER): Payer: MEDICAID | Admitting: Physician Assistant

## 2023-09-30 ENCOUNTER — Other Ambulatory Visit: Payer: Self-pay

## 2023-09-30 ENCOUNTER — Encounter: Payer: Self-pay | Admitting: Physician Assistant

## 2023-09-30 ENCOUNTER — Other Ambulatory Visit: Payer: Self-pay | Admitting: Physician Assistant

## 2023-09-30 VITALS — BP 142/88 | HR 98 | Temp 98.0°F | Resp 18 | Ht 61.0 in | Wt 247.0 lb

## 2023-09-30 DIAGNOSIS — Z5181 Encounter for therapeutic drug level monitoring: Secondary | ICD-10-CM

## 2023-09-30 DIAGNOSIS — L02415 Cutaneous abscess of right lower limb: Secondary | ICD-10-CM

## 2023-09-30 DIAGNOSIS — L732 Hidradenitis suppurativa: Secondary | ICD-10-CM | POA: Diagnosis not present

## 2023-09-30 DIAGNOSIS — L03115 Cellulitis of right lower limb: Secondary | ICD-10-CM

## 2023-09-30 DIAGNOSIS — E559 Vitamin D deficiency, unspecified: Secondary | ICD-10-CM | POA: Insufficient documentation

## 2023-09-30 DIAGNOSIS — I10 Essential (primary) hypertension: Secondary | ICD-10-CM | POA: Insufficient documentation

## 2023-09-30 DIAGNOSIS — D509 Iron deficiency anemia, unspecified: Secondary | ICD-10-CM | POA: Diagnosis not present

## 2023-09-30 DIAGNOSIS — E119 Type 2 diabetes mellitus without complications: Secondary | ICD-10-CM | POA: Insufficient documentation

## 2023-09-30 DIAGNOSIS — F4320 Adjustment disorder, unspecified: Secondary | ICD-10-CM | POA: Insufficient documentation

## 2023-09-30 DIAGNOSIS — F333 Major depressive disorder, recurrent, severe with psychotic symptoms: Secondary | ICD-10-CM | POA: Insufficient documentation

## 2023-09-30 DIAGNOSIS — F411 Generalized anxiety disorder: Secondary | ICD-10-CM | POA: Insufficient documentation

## 2023-09-30 MED ORDER — CYCLOBENZAPRINE HCL 5 MG PO TABS: 5.0000 mg | ORAL_TABLET | Freq: Three times a day (TID) | ORAL | 1 refills | Status: DC | PRN

## 2023-09-30 MED ORDER — HYDROCODONE-ACETAMINOPHEN 5-325 MG PO TABS: 1.0000 | ORAL_TABLET | Freq: Two times a day (BID) | ORAL | 0 refills | Status: AC | PRN

## 2023-09-30 NOTE — Patient Instructions (Addendum)
Please keep your apt with Wound care services  Please call [facility] to schedule your follow up apt for your incision and drainage [phone]  Please only use gentle soap and warm water as well as the cleaning solution provided to clean your wounds You can use a small amount of Vaseline or Aquaphor to assist with keeping the area moist- this will help with healing  Please continue using the Lantus as directed. You will need to make an apt with your PCP in about 1 week to discuss the changes to your medications and for regular follow up to resume

## 2023-09-30 NOTE — Progress Notes (Signed)
Referral re-placed/sh

## 2023-09-30 NOTE — Progress Notes (Unsigned)
Acute Office Visit   Patient: Theresa Malone   DOB: 1981/07/09   42 y.o. Female  MRN: [id number] Visit Date: 09/30/2023  Today's healthcare provider: Oswaldo Conroy Maziyah Vessel, PA-C  Introduced myself to the patient as a Secondary school teacher and provided education on APPs in clinical practice.    Chief Complaint  Patient presents with   Follow-up    Hospital d/c 09/25/2023   Subjective    HPI HPI     Follow-up    Additional comments: Hospital d/c 09/25/2023      Last edited by Benay Pike, CMA on 09/30/2023  3:03 PM.       She is hoping to have refill of pain medications and muscle relaxer  She has been taking twice per day  She has been taking Gabapentin and Tylenol, along with muscle relaxer   She still has large ulceration in her right leg and lesion on her breast  She reports she is still having stinging and burning in her right leg - similar to when she was diagnosed with cellulitis  She was contacted by Wound care during apt with me - she set apt for the end of Nov     Transition of [facility] Follow up.   Hospital/Facility: [facility]  D/C Physician: Hartley Barefoot, MD  D/C Date: 09-25-23  Records Requested:  Records Received:  Records Reviewed:   Diagnoses on Discharge:  Active Problems:   Cellulitis of right lower extremity   Type II diabetes mellitus with renal manifestations (HCC)   Hypertension associated with diabetes (HCC)   CKD (chronic kidney disease) stage 3, GFR 30-59 ml/min (HCC)   Cellulitis   Breast abscess   Cellulitis and abscess of leg  Date of interactive Contact within 48 hours of discharge: unable to contact patient  Contact was through: attempted through phone  Date of 7 day or 14 day face-to-face visit:    within 7 days  Outpatient Encounter Medications as of 09/30/2023  Medication Sig   acetaminophen (TYLENOL) 650 MG CR tablet Take 1 tablet (650 mg total) by mouth every 8 (eight) hours as needed for pain.   amLODipine (NORVASC) 5 MG  tablet Take 1 tablet (5 mg total) by mouth daily.   amoxicillin-clavulanate (AUGMENTIN XR) 1000-62.5 MG 12 hr tablet Take 2 tablets by mouth 2 (two) times daily for 7 days.   BD PEN NEEDLE MICRO U/F 32G X 6 MM MISC Inject into the skin daily.   cyanocobalamin 1000 MCG tablet Take 1 tablet (1,000 mcg total) by mouth daily.   cyclobenzaprine (FLEXERIL) 5 MG tablet Take 1 tablet by mouth three times daily as needed for muscle spasm   escitalopram (LEXAPRO) 20 MG tablet Take 40 mg by mouth daily.   folic acid (FOLVITE) 1 MG tablet Take 1 tablet (1 mg total) by mouth daily.   gabapentin (NEURONTIN) 300 MG capsule Take 2 capsules by mouth twice daily   iron polysaccharides (NIFEREX) 150 MG capsule Take 1 capsule (150 mg total) by mouth daily.   LANTUS SOLOSTAR 100 UNIT/ML Solostar Pen SMARTSIG:40 Unit(s) SUB-Q Twice Daily   linezolid (ZYVOX) 600 MG tablet Take 1 tablet (600 mg total) by mouth 2 (two) times daily for 7 days.   metoprolol tartrate (LOPRESSOR) 50 MG tablet Take 1 tablet (50 mg total) by mouth 2 (two) times daily.   Multiple Vitamin (MULTIVITAMIN WITH MINERALS) TABS tablet Take 1 tablet by mouth daily.   [START ON 10/01/2023] Vitamin  D, Ergocalciferol, (DRISDOL) 1.25 MG (50000 UNIT) CAPS capsule Take 1 capsule (50,000 Units total) by mouth every 7 (seven) days.   clindamycin-benzoyl peroxide (BENZACLIN) gel Apply topically 2 (two) times daily. (Patient not taking: Reported on 09/20/2023)   Continuous Glucose Sensor (DEXCOM G7 SENSOR) MISC Change every 10 days use to continuously monitor blood sugar (Patient not taking: Reported on 09/30/2023)   insulin aspart (NOVOLOG FLEXPEN) 100 UNIT/ML FlexPen CBG 70 - 120: 0 units CBG 121 - 150: 1 unit CBG 151 - 200: 2 units CBG 201 - 250: 3 units CBG 251 - 300: 5 units CBG 301 - 350: 7 units CBG 351 - 400: 9 units CBG > 400: call MD (Patient not taking: Reported on 09/30/2023)   insulin glargine-yfgn (SEMGLEE) 100 UNIT/ML injection Inject 0.4 mLs (40 Units  total) into the skin 2 (two) times daily. (Patient not taking: Reported on 09/30/2023)   [DISCONTINUED] clonazepam (KLONOPIN) 0.125 MG disintegrating tablet Take 1 tablet (0.125 mg total) by mouth 2 (two) times daily. (Patient not taking: Reported on 05/19/2019)   No facility-administered encounter medications on file as of 09/30/2023.    Diagnostic Tests Reviewed/Disposition: Reviewed US venous of right leg from 09/20/23- negative for thrombosis, CT of Tib/Fib right with contrast from 09/20/23 :IMPRESSION: 1. Cutaneous wound at the right anteromedial mid shin with surrounding cellulitis. No fluid collection identified. No soft tissue gas. 2. No acute osseous abnormality.  I also reviewed her labs from her stay as well as medication changes recommended at DC I have reviewed her apt notes from Derm apt on 09/26/23   Consults: Surgery for I&D- she should have follow up with outpatient surgery around 10/08/23 for continued management   Discharge Instructions: Patient instructed to stop clindamycin, Losartan-hydrochlorothiazide, metformin, naproxen and trulicity  Reviewed wound care instructions - dry dressing BID to both sites   Disease/illness Education:  Home Health/Community Services Discussions/Referrals: Patient was referred to wound care for continued management along with referrals to Nutrition services and Dermatology   Establishment or re-establishment of referral orders for community resources:  Discussion with other health care providers: none   Assessment and Support of treatment regimen adherence: she has not been contacted by Wound care yet - referral was placed at Derm apt on 09/26/23  She has been taking Abx as directed She reports her blood sugars have been in 200-230 since being on the Lantus - she reports this is better than her previous results   Appointments Coordinated with: Recommend scheduling apt with general surgery- discussed this and provided contact information  for [facility]   Education for self-management, independent living, and ADLs: Continue wound care, encouraged her to attend regular apts with PCP and Derm for DM management and hidradenitis care   Medications: Outpatient Medications Prior to Visit  Medication Sig   acetaminophen (TYLENOL) 650 MG CR tablet Take 1 tablet (650 mg total) by mouth every 8 (eight) hours as needed for pain.   amLODipine (NORVASC) 5 MG tablet Take 1 tablet (5 mg total) by mouth daily.   amoxicillin-clavulanate (AUGMENTIN XR) 1000-62.5 MG 12 hr tablet Take 2 tablets by mouth 2 (two) times daily for 7 days.   BD PEN NEEDLE MICRO U/F 32G X 6 MM MISC Inject into the skin daily.   cyanocobalamin 1000 MCG tablet Take 1 tablet (1,000 mcg total) by mouth daily.   cyclobenzaprine (FLEXERIL) 5 MG tablet Take 1 tablet by mouth three times daily as needed for muscle spasm   escitalopram (LEXAPRO) 20  MG tablet Take 40 mg by mouth daily.   folic acid (FOLVITE) 1 MG tablet Take 1 tablet (1 mg total) by mouth daily.   gabapentin (NEURONTIN) 300 MG capsule Take 2 capsules by mouth twice daily   iron polysaccharides (NIFEREX) 150 MG capsule Take 1 capsule (150 mg total) by mouth daily.   LANTUS SOLOSTAR 100 UNIT/ML Solostar Pen SMARTSIG:40 Unit(s) SUB-Q Twice Daily   linezolid (ZYVOX) 600 MG tablet Take 1 tablet (600 mg total) by mouth 2 (two) times daily for 7 days.   metoprolol tartrate (LOPRESSOR) 50 MG tablet Take 1 tablet (50 mg total) by mouth 2 (two) times daily.   Multiple Vitamin (MULTIVITAMIN WITH MINERALS) TABS tablet Take 1 tablet by mouth daily.   [START ON 10/01/2023] Vitamin D, Ergocalciferol, (DRISDOL) 1.25 MG (50000 UNIT) CAPS capsule Take 1 capsule (50,000 Units total) by mouth every 7 (seven) days.   clindamycin-benzoyl peroxide (BENZACLIN) gel Apply topically 2 (two) times daily. (Patient not taking: Reported on 09/20/2023)   Continuous Glucose Sensor (DEXCOM G7 SENSOR) MISC Change every 10 days  use to continuously monitor blood sugar (Patient not taking: Reported on 09/30/2023)   insulin aspart (NOVOLOG FLEXPEN) 100 UNIT/ML FlexPen CBG 70 - 120: 0 units CBG 121 - 150: 1 unit CBG 151 - 200: 2 units CBG 201 - 250: 3 units CBG 251 - 300: 5 units CBG 301 - 350: 7 units CBG 351 - 400: 9 units CBG > 400: call MD (Patient not taking: Reported on 09/30/2023)   insulin glargine-yfgn (SEMGLEE) 100 UNIT/ML injection Inject 0.4 mLs (40 Units total) into the skin 2 (two) times daily. (Patient not taking: Reported on 09/30/2023)   No facility-administered medications prior to visit.    Review of Systems  Skin:  Positive for wound.      {See past labs  Heme  Chem  Endocrine  Serology  Results Review (optional):1}   Objective    BP (!) 142/88 (BP Location: Left Arm, Patient Position: Sitting, Cuff Size: Large)   Pulse 98   Temp 98 F (36.7 C) (Oral)   Resp 18   Ht 5\' 1"  (1.549 m) Comment: per patient  Wt 247 lb (112 kg)   SpO2 98%   BMI 46.67 kg/m   {See vitals history (optional):1}   Physical Exam Vitals reviewed.  Constitutional:      General: She is awake.     Appearance: Normal appearance. She is well-developed and well-groomed.  HENT:     Head: Normocephalic and atraumatic.  Neurological:     Mental Status: She is alert.  Psychiatric:        Behavior: Behavior is cooperative.       Media Information   Document Information  Photos  Right leg ulceration  09/30/2023 15:47  Attached To:  Office Visit on 09/30/23 with Arleigh Odowd, Oswaldo Conroy, PA-C  Source Information  Hinata Diener, Oswaldo Conroy, PA-C  [facility]  Document History       Media Information   Document Information  Photos  Left breast wound  09/30/2023 15:48  Attached To:  Office Visit on 09/30/23 with Raelie Lohr, Oswaldo Conroy, PA-C  Source Information  Latamara Melder, Oswaldo Conroy, PA-C  [facility]  Document History      No results found for any visits on 09/30/23.  Assessment & Plan      No follow-ups  on file.     Problem List Items Addressed This Visit       Other  Cellulitis of right lower extremity   Microcytic anemia   Other Visit Diagnoses     Abscess of right lower leg    -  Primary   Relevant Medications   cyclobenzaprine (FLEXERIL) 5 MG tablet   HYDROcodone-acetaminophen (NORCO/VICODIN) 5-325 MG tablet   Other Relevant Orders   Ambulatory referral to Pain Clinic   COMPLETE METABOLIC PANEL WITH GFR   CBC w/Diff/Platelet   Hidradenitis       Medication monitoring encounter       Relevant Orders   COMPLETE METABOLIC PANEL WITH GFR   CBC w/Diff/Platelet   Iron, TIBC and Ferritin Panel   B12 and Folate Panel        Return in about 2 weeks (around 10/14/2023) for with PCP for DM, HTN, wound care follow up, medication monitoring .   I, Adan Beal E Zhaniya Swallows, PA-C, have reviewed all documentation for this visit. The documentation on 09/30/23 for the exam, diagnosis, procedures, and orders are all accurate and complete.   Jacquelin Hawking, MHS, PA-C [facility]

## 2023-10-01 LAB — QUANTIFERON-TB GOLD PLUS
QuantiFERON Mitogen Value: 0.07 [IU]/mL
QuantiFERON Nil Value: 0.07 [IU]/mL
QuantiFERON TB1 Ag Value: 0.02 [IU]/mL
QuantiFERON TB2 Ag Value: 0.02 [IU]/mL
QuantiFERON-TB Gold Plus: UNDETERMINED — AB

## 2023-10-01 LAB — CBC WITH DIFFERENTIAL/PLATELET
Absolute Lymphocytes: 1784 {cells}/uL (ref 850–3900)
Absolute Monocytes: 618 {cells}/uL (ref 200–950)
Basophils Absolute: 26 {cells}/uL (ref 0–200)
Basophils Relative: 0.3 %
Eosinophils Absolute: 331 {cells}/uL (ref 15–500)
Eosinophils Relative: 3.8 %
HCT: 27.3 % — ABNORMAL LOW (ref 35.0–45.0)
Hemoglobin: 8.5 g/dL — ABNORMAL LOW (ref 11.7–15.5)
MCH: 26 pg — ABNORMAL LOW (ref 27.0–33.0)
MCHC: 31.1 g/dL — ABNORMAL LOW (ref 32.0–36.0)
MCV: 83.5 fL (ref 80.0–100.0)
MPV: 9.4 fL (ref 7.5–12.5)
Monocytes Relative: 7.1 %
Neutro Abs: 5942 {cells}/uL (ref 1500–7800)
Neutrophils Relative %: 68.3 %
Platelets: 475 10*3/uL — ABNORMAL HIGH (ref 140–400)
RBC: 3.27 10*6/uL — ABNORMAL LOW (ref 3.80–5.10)
RDW: 15.8 % — ABNORMAL HIGH (ref 11.0–15.0)
Total Lymphocyte: 20.5 %
WBC: 8.7 10*3/uL (ref 3.8–10.8)

## 2023-10-01 LAB — COMPLETE METABOLIC PANEL WITH GFR
AG Ratio: 0.9 (calc) — ABNORMAL LOW (ref 1.0–2.5)
ALT: 21 U/L (ref 6–29)
AST: 16 U/L (ref 10–30)
Albumin: 3.4 g/dL — ABNORMAL LOW (ref 3.6–5.1)
Alkaline phosphatase (APISO): 169 U/L — ABNORMAL HIGH (ref 31–125)
BUN/Creatinine Ratio: 15 (calc) (ref 6–22)
BUN: 22 mg/dL (ref 7–25)
CO2: 27 mmol/L (ref 20–32)
Calcium: 9.7 mg/dL (ref 8.6–10.2)
Chloride: 97 mmol/L — ABNORMAL LOW (ref 98–110)
Creat: 1.43 mg/dL — ABNORMAL HIGH (ref 0.50–0.97)
Globulin: 4 g/dL — ABNORMAL HIGH (ref 1.9–3.7)
Glucose, Bld: 333 mg/dL — ABNORMAL HIGH (ref 65–99)
Potassium: 5.4 mmol/L — ABNORMAL HIGH (ref 3.5–5.3)
Sodium: 132 mmol/L — ABNORMAL LOW (ref 135–146)
Total Bilirubin: 0.3 mg/dL (ref 0.2–1.2)
Total Protein: 7.4 g/dL (ref 6.1–8.1)
eGFR: 48 mL/min/{1.73_m2} — ABNORMAL LOW (ref >=60)

## 2023-10-01 LAB — HEPATITIS B SURFACE ANTIBODY,QUALITATIVE: Hep B Surface Ab, Qual: NONREACTIVE

## 2023-10-01 LAB — IRON,TIBC AND FERRITIN PANEL
%SAT: 38 % (ref 16–45)
Ferritin: 274 ng/mL — ABNORMAL HIGH (ref 16–154)
Iron: 100 ug/dL (ref 40–190)
TIBC: 262 ug/dL (ref 250–450)

## 2023-10-01 LAB — HIV ANTIBODY (ROUTINE TESTING W REFLEX): HIV Screen 4th Generation wRfx: NONREACTIVE

## 2023-10-01 LAB — B12 AND FOLATE PANEL
Folate: 6.8 ng/mL
Vitamin B-12: 690 pg/mL (ref 200–1100)

## 2023-10-01 LAB — HEPATITIS C ANTIBODY: Hep C Virus Ab: NONREACTIVE

## 2023-10-01 LAB — HEPATITIS B SURFACE ANTIGEN: Hepatitis B Surface Ag: NEGATIVE

## 2023-10-01 NOTE — Telephone Encounter (Signed)
Requested medication (s) are due for refill today: No  Requested medication (s) are on the active medication list: Yes  Last refill:  09/30/23  Future visit scheduled: No  Notes to clinic:  Per pharmacy Pt currently taking zyvox, flexeril interaction increase risk of serotonin syndrome. Please let pharmacy know how to proceed.     Requested Prescriptions  Pending Prescriptions Disp Refills   cyclobenzaprine (FLEXERIL) 5 MG tablet [Pharmacy Med Name: CYCLOBENZAPRINE 5MG  TAB] 30 tablet 1    Sig: TAKE 1 TABLET BY MOUTH THREE TIMES DAILY AS NEEDED FOR MUSCLE SPASM     Not Delegated - Analgesics:  Muscle Relaxants Failed - 09/30/2023  4:56 PM      Failed - This refill cannot be delegated      Passed - Valid encounter within last 6 months    Recent Outpatient Visits           Yesterday Abscess of right lower leg   [facility] Mecum, Erin E, PA-C   2 weeks ago Abscess of right lower leg   [facility] Danelle Berry, PA-C   3 months ago Type 2 diabetes mellitus with chronic kidney disease, with long-term current use of insulin, unspecified CKD stage [facility])   [facility] Jessup, [place], PA-C   4 months ago No-show for appointment   [facility] Alfredia Ferguson, PA-C   5 months ago Type 2 diabetes mellitus with chronic kidney disease, with long-term current use of insulin, unspecified CKD stage [facility])   [facility] Alfredia Ferguson, PA-C       Future Appointments             In 4 weeks Elie Goody, MD [facility]

## 2023-10-02 ENCOUNTER — Other Ambulatory Visit: Payer: Self-pay | Admitting: Physician Assistant

## 2023-10-02 DIAGNOSIS — L02415 Cutaneous abscess of right lower limb: Secondary | ICD-10-CM

## 2023-10-03 NOTE — Telephone Encounter (Signed)
Requested medications are due for refill today.  unsure  Requested medications are on the active medications list.  yes  Last refill. 09/30/2023 #30 1 rf  Future visit scheduled.   no  Notes to clinic.  Pharmacy comment: There is a drug interaction between cyclobenzaprine and Zyvox (increased risk of serotonin syndrome). Please advise.   Refill/refusal not delegated.    Requested Prescriptions  Pending Prescriptions Disp Refills   cyclobenzaprine (FLEXERIL) 5 MG tablet [Pharmacy Med Name: CYCLOBENZAPRINE 5MG  TAB] 30 tablet 1    Sig: TAKE 1 TABLET BY MOUTH THREE TIMES DAILY AS NEEDED FOR MUSCLE SPASM     Not Delegated - Analgesics:  Muscle Relaxants Failed - 10/02/2023  2:02 PM      Failed - This refill cannot be delegated      Passed - Valid encounter within last 6 months    Recent Outpatient Visits           3 days ago Abscess of right lower leg   [facility] Mecum, Erin E, PA-C   2 weeks ago Abscess of right lower leg   [facility] Danelle Berry, PA-C   3 months ago Type 2 diabetes mellitus with chronic kidney disease, with long-term current use of insulin, unspecified CKD stage [facility])   [place] [facility] [place], [place], PA-C   4 months ago No-show for appointment   [facility] Alfredia Ferguson, PA-C   5 months ago Type 2 diabetes mellitus with chronic kidney disease, with long-term current use of insulin, unspecified CKD stage [facility])    [facility] Alfredia Ferguson, PA-C       Future Appointments             In 3 weeks Elie Goody, MD [facility]

## 2023-10-04 ENCOUNTER — Ambulatory Visit: Payer: Self-pay

## 2023-10-04 ENCOUNTER — Telehealth: Payer: Self-pay

## 2023-10-04 NOTE — Progress Notes (Signed)
Your labs are back Your CBC shows that you continue to have anemia but this appears to be improving since you are in the hospital. Your iron and ferritin panel appears improved Your B12 and folate are in normal ranges Your electrolytes show some significant derangements particularly your potassium.  Please reduce consumption of foods that are high in potassium such as bananas, potatoes, tomatoes.  Please also make sure you are drinking plenty of water I do recommend that you have your labs redrawn in about a week to make sure that these derangements are improving Your kidney function does appear to be a bit decreased from normal range.  This does appear to be close to your normal but I do recommend that you make sure you are staying well-hydrated and avoid things that are toxic to your kidneys such as NSAIDs.  Please keep upcoming appointments with your PCP for further follow-up

## 2023-10-04 NOTE — Telephone Encounter (Signed)
  Chief Complaint: right leg very swollen copious amounts of brown yellow discharge. No smell. Area is warm and black Symptoms: above Frequency: Ongoing Pertinent Negatives: Patient denies fever Disposition: [] ED /[x] Urgent Care (no appt availability in office) / [] Appointment(In office/virtual)/ []  [facility] [place] [facility]/ [] Home Care/ [] Refused Recommended Disposition /[] [facility]/ [x]  Follow-up with PCP Additional Notes: Returned pt's call. Pt reports that she worked yesterday. When she arrived home she found her right leg very swollen. Wound is draining a lot brown yellow. Area is warm and very dark. Pt states no smell. Appt for Monday with PCP. Pt does not want to see anyone other than her PCP. Pt would like to be seen today.  Advised UC for immediate care. Please advise.  Summary: wound on right leg and left breast / discharge is hard and dark.   Pt stated she has a wound on right leg and left breast was seen at the hospital for it. She mentioned may still be cellulitis was seen 10/25 stated the wound has discharge is hard and dark.  Pain is 6-10 is stinging and burning.  Seeking clinical advice.     Reason for Disposition  [1] Follow-up call from patient regarding patient's clinical status AND [2] information urgent  Answer Assessment - Initial Assessment Questions 1. REASON FOR CALL or QUESTION: "What is your reason for calling today?" or "How can I best help you?" or "What question do you have that I can help answer?"     Pt states that wound is black . Leg is very swollen, oozing  a lot of brown/yellow discharge. Area feels warm 2. CALLER: Document the source of call. (e.g., laboratory, patient).     PT  Protocols used: PCP Call - No Triage-A-AH

## 2023-10-06 NOTE — Progress Notes (Signed)
  Established patient visit Patient was not seen for appt d/t no call, no show, or late arrival >10 mins past appt time.    [name] PA [facility] [address] [place], [place] [address] [phone] (phone) [phone] (fax) [facility]

## 2023-10-07 ENCOUNTER — Ambulatory Visit: Payer: MEDICAID | Admitting: Physician Assistant

## 2023-10-07 DIAGNOSIS — Z91199 Patient's noncompliance with other medical treatment and regimen due to unspecified reason: Secondary | ICD-10-CM

## 2023-10-07 DIAGNOSIS — Z7689 Persons encountering health services in other specified circumstances: Secondary | ICD-10-CM

## 2023-10-08 NOTE — Addendum Note (Signed)
Addended by: Jacquelin Hawking on: 10/08/2023 10:26 AM   Modules accepted: Level of Service

## 2023-10-09 ENCOUNTER — Ambulatory Visit: Payer: Medicaid Other | Admitting: Rheumatology

## 2023-10-10 ENCOUNTER — Telehealth: Payer: Self-pay

## 2023-10-14 ENCOUNTER — Telehealth: Payer: Self-pay

## 2023-10-16 ENCOUNTER — Telehealth: Payer: Self-pay

## 2023-10-22 ENCOUNTER — Encounter: Payer: MEDICAID | Attending: Physician Assistant | Admitting: Physician Assistant

## 2023-10-22 DIAGNOSIS — E1122 Type 2 diabetes mellitus with diabetic chronic kidney disease: Secondary | ICD-10-CM | POA: Diagnosis not present

## 2023-10-22 DIAGNOSIS — Z6841 Body Mass Index (BMI) 40.0 and over, adult: Secondary | ICD-10-CM | POA: Diagnosis not present

## 2023-10-22 DIAGNOSIS — N183 Chronic kidney disease, stage 3 unspecified: Secondary | ICD-10-CM | POA: Insufficient documentation

## 2023-10-22 DIAGNOSIS — E11622 Type 2 diabetes mellitus with other skin ulcer: Secondary | ICD-10-CM | POA: Insufficient documentation

## 2023-10-22 DIAGNOSIS — E669 Obesity, unspecified: Secondary | ICD-10-CM | POA: Insufficient documentation

## 2023-10-22 DIAGNOSIS — I129 Hypertensive chronic kidney disease with stage 1 through stage 4 chronic kidney disease, or unspecified chronic kidney disease: Secondary | ICD-10-CM | POA: Diagnosis not present

## 2023-10-22 DIAGNOSIS — L732 Hidradenitis suppurativa: Secondary | ICD-10-CM | POA: Insufficient documentation

## 2023-10-22 DIAGNOSIS — L03115 Cellulitis of right lower limb: Secondary | ICD-10-CM | POA: Insufficient documentation

## 2023-10-22 NOTE — Progress Notes (Signed)
Theresa Malone ([id number]) [id number] Page 1 of 5 Visit Report for 10/22/2023 Abuse Risk Screen Details Patient Name: Date of Service: Theresa Malone, Theresa Malone 10/22/2023 9:30 A M Medical Record Number: [id number] Patient Account Number: [id number] Date of Birth/Sex: Treating RN: 12-01-1980 (42 y.o. Freddy Finner Primary Care Marnisha Stampley: [name] Other Clinician: Referring Lynnix Schoneman: Treating Wrigley Plasencia/Extender: Elinor Parkinson, CO LLIN-JA MA L Weeks in Treatment: 0 Abuse Risk Screen Items Answer ABUSE RISK SCREEN: Has anyone close to you tried to hurt or harm you recentlyo No Do you feel uncomfortable with anyone in your familyo No Has anyone forced you do things that you didnt want to doo No Electronic Signature(s) Signed: 10/22/2023 4:31:34 PM By: Yevonne Pax RN Entered By: Yevonne Pax on 10/22/2023 06:55:49 -------------------------------------------------------------------------------- Activities of Daily Living Details Patient Name: Date of Service: Theresa Malone 10/22/2023 9:30 A M Medical Record Number: [id number] Patient Account Number: [id number] Date of Birth/Sex: Treating RN: [birth year] (42 y.o. Freddy Finner Primary Care Kairee Isa: [name] Other Clinician: Referring Jourdyn Ferrin: Treating Eisa Necaise/Extender: Elinor Parkinson, CO LLIN-JA MA L Weeks in Treatment: 0 Activities of Daily Living Items Answer Activities of Daily Living (Please select one for each item) Drive Automobile Completely Able T Medications ake Completely Able Use T elephone Completely Able Care for Appearance Completely Able Use T oilet Completely Able Bath / Shower Completely Able Dress Self Completely Able Feed Self Completely Able Walk Completely Able Get In / Out Bed Completely Able Housework Completely Theresa Malone ([id number]) [id number] [id number] Page 2 of 5 Prepare Meals Completely Able Handle Money  Completely Able Shop for Self Completely Able Electronic Signature(s) Signed: 10/22/2023 4:31:34 PM By: Yevonne Pax RN Entered By: Yevonne Pax on 10/22/2023 06:56:13 -------------------------------------------------------------------------------- Education Screening Details Patient Name: Date of Service: Theresa Malone 10/22/2023 9:30 A M Medical Record Number: [id number] Patient Account Number: [id number] Date of Birth/Sex: Treating RN: 12/27/1980 (42 y.o. Freddy Finner Primary Care Nazareth Kirk: [name] Other Clinician: Referring Clanton Emanuelson: Treating Ernesteen Mihalic/Extender: Elinor Parkinson, CO LLIN-JA MA L Weeks in Treatment: 0 Learning Preferences/Education Level/Primary Language Learning Preference: Explanation Highest Education Level: College or Above Preferred Language: English Cognitive Barrier Language Barrier: No Translator Needed: No Memory Deficit: No Emotional Barrier: No Cultural/Religious Beliefs Affecting Medical Care: No Physical Barrier Impaired Vision: Yes Glasses Impaired Hearing: No Decreased Hand dexterity: No Knowledge/Comprehension Knowledge Level: Medium Comprehension Level: High Ability to understand written instructions: High Ability to understand verbal instructions: High Motivation Anxiety Level: Anxious Cooperation: Cooperative Education Importance: Acknowledges Need Interest in Health Problems: Asks Questions Perception: Coherent Willingness to Engage in Self-Management High Activities: Readiness to Engage in Self-Management High Activities: Electronic Signature(s) Signed: 10/22/2023 4:31:34 PM By: Yevonne Pax RN Entered By: Yevonne Pax on 10/22/2023 06:57:08 Dykman, [name] Malone ([id number]) [id number] Page 3 of 5 -------------------------------------------------------------------------------- Fall Risk Assessment Details Patient Name: Date of Service: [name], [name] 10/22/2023 9:30 A M Medical  Record Number: [id number] Patient Account Number: [id number] Date of Birth/Sex: Treating RN: [birth year] (42 y.o. Freddy Finner Primary Care Harvin Konicek: [name] Other Clinician: Referring Chenika Nevils: Treating Dimitra Woodstock/Extender: Elinor Parkinson, CO LLIN-JA MA L Weeks in Treatment: 0 Fall Risk Assessment Items Have you had 2 or more falls in the last 12 monthso 0 No Have you had any fall that resulted in injury in the last 12 monthso 0 No FALLS RISK SCREEN History of falling - immediate or within 3 months 0 No Secondary diagnosis (Do you have 2 or more medical diagnoseso) 0  No Ambulatory aid None/bed rest/wheelchair/nurse 0 Yes Crutches/cane/walker 0 No Furniture 0 No Intravenous therapy Access/Saline/Heparin Lock 0 No Gait/Transferring Normal/ bed rest/ wheelchair 0 Yes Weak (short steps with or without shuffle, stooped but able to lift head while walking, may seek 0 No support from furniture) Impaired (short steps with shuffle, may have difficulty arising from chair, head down, impaired 0 No balance) Mental Status Oriented to own ability 0 Yes Electronic Signature(s) Signed: 10/22/2023 4:31:34 PM By: Yevonne Pax RN Entered By: Yevonne Pax on 10/22/2023 06:57:27 -------------------------------------------------------------------------------- Foot Assessment Details Patient Name: Date of Service: Theresa Malone 10/22/2023 9:30 A M Medical Record Number: [id number] Patient Account Number: [id number] Date of Birth/Sex: Treating RN: January 09, 1981 (42 y.o. Freddy Finner Primary Care Jen Benedict: [name] Other Clinician: Referring Kaari Zeigler: Treating Janaya Broy/Extender: Elinor Parkinson, CO LLIN-JA MA L Weeks in Treatment: 0 Foot Assessment Items Site Locations [place], [place] Malone ([id number]) [id number] Page 4 of 5 + = Sensation present, - = Sensation absent, C = Callus, U = Ulcer Malone = Redness, W = Warmth, M = Maceration, PU =  Pre-ulcerative lesion F = Fissure, S = Swelling, D = Dryness Assessment Right: Left: Other Deformity: No No Prior Foot Ulcer: No No Prior Amputation: No No Charcot Joint: No No Ambulatory Status: Ambulatory Without Help Gait: Steady Electronic Signature(s) Signed: 10/22/2023 4:31:34 PM By: Yevonne Pax RN Entered By: Yevonne Pax on 10/22/2023 06:57:56 -------------------------------------------------------------------------------- Nutrition Risk Screening Details Patient Name: Date of Service: JASSMINE, PARROTTA 10/22/2023 9:30 A M Medical Record Number: [id number] Patient Account Number: [id number] Date of Birth/Sex: Treating RN: 1981/10/31 (42 y.o. Freddy Finner Primary Care Jaelie Aguilera: [name] Other Clinician: Referring Arhum Peeples: Treating Brantlee Penn/Extender: Elinor Parkinson, CO LLIN-JA MA L Weeks in Treatment: 0 Height (in): 61 Weight (lbs): 252 Body Mass Index (BMI): 47.6 Nutrition Risk Screening Items Score Screening NUTRITION RISK SCREEN: I have an illness or condition that made me change the kind and/or amount of food I eat 0 No I eat fewer than two meals per day 0 No I eat few fruits and vegetables, or milk products 0 No I have three or more drinks of beer, liquor or wine almost every day 0 No I have tooth or mouth problems that make it hard for me to eat 0 No I don't always have enough money to buy the food I need 0 No Grosshans, Emaya Malone ([id number]) [phone] [id number] Page 5 of 5 I eat alone most of the time 0 No I take three or more different prescribed or over-the-counter drugs a day 1 Yes Without wanting to, I have lost or gained 10 pounds in the last six months 0 No I am not always physically able to shop, cook and/or feed myself 0 No Nutrition Protocols Good Risk Protocol 0 No interventions needed Moderate Risk Protocol High Risk Proctocol Risk Level: Good Risk Score: 1 Electronic Signature(s) Signed: 10/22/2023 4:31:34 PM  By: Yevonne Pax RN Entered By: Yevonne Pax on 10/22/2023 06:57:47

## 2023-10-22 NOTE — Progress Notes (Signed)
LEKEESHA, STREVEL R ([id number]) [id number] Page 1 of 10 Visit Report for 10/22/2023 Allergy List Details Patient Name: Date of Service: QUIARA, KORZENIEWSKI 10/22/2023 9:30 A M Medical Record Number: [id number] Patient Account Number: [id number] Date of Birth/Sex: Treating RN: [birth year] (42 y.o. Freddy Finner Primary Care Kirtan Sada: [name] Other Clinician: Referring Teran Daughenbaugh: Treating Genesee Nase/Extender: Elinor Parkinson, CO LLIN-JA MA L Weeks in Treatment: 0 Allergies Active Allergies Sulfa (Sulfonamide Antibiotics) lisinopril Allergy Notes Electronic Signature(s) Signed: 10/22/2023 4:31:34 PM By: Yevonne Pax RN Entered By: Yevonne Pax on 10/22/2023 06:49:49 -------------------------------------------------------------------------------- Arrival Information Details Patient Name: Date of Service: [name]. 10/22/2023 9:30 A M Medical Record Number: [id number] Patient Account Number: [id number] Date of Birth/Sex: Treating RN: 1980/12/01 (42 y.o. Freddy Finner Primary Care Pihu Basil: [name] Other Clinician: Referring Alando Colleran: Treating Sydelle Sherfield/Extender: Elinor Parkinson, CO LLIN-JA MA L Weeks in Treatment: 0 Visit Information Patient Arrived: Ambulatory Arrival Time: 09:44 Accompanied By: husband Transfer Assistance: None Patient Identification Verified: Yes Secondary Verification Process Completed: Yes Patient Requires Transmission-Based Precautions: No Patient Has Alerts: No Electronic Signature(s) Signed: 10/22/2023 4:31:34 PM By: Yevonne Pax RN Lavine, Francina R ([id number]) [id number] Page 2 of 10 Entered By: Yevonne Pax on 10/22/2023 06:45:11 -------------------------------------------------------------------------------- Clinic Level of Care Assessment Details Patient Name: Date of Service: MEDRITH, CLABO 10/22/2023 9:30 A M Medical Record Number: [id number] Patient Account Number:  [id number] Date of Birth/Sex: Treating RN: July 27, 1981 (42 y.o. Freddy Finner Primary Care Demarrio Menges: [name] Other Clinician: Referring Penny Arrambide: Treating Zeta Bucy/Extender: Elinor Parkinson, CO LLIN-JA MA L Weeks in Treatment: 0 Clinic Level of Care Assessment Items TOOL 2 Quantity Score X- 1 0 Use when only an EandM is performed on the INITIAL visit ASSESSMENTS - Nursing Assessment / Reassessment X- 1 20 General Physical Exam (combine w/ comprehensive assessment (listed just below) when performed on new pt. evals) X- 1 25 Comprehensive Assessment (HX, ROS, Risk Assessments, Wounds Hx, etc.) ASSESSMENTS - Wound and Skin A ssessment / Reassessment []  - 0 Simple Wound Assessment / Reassessment - one wound X- 2 5 Complex Wound Assessment / Reassessment - multiple wounds []  - 0 Dermatologic / Skin Assessment (not related to wound area) ASSESSMENTS - Ostomy and/or Continence Assessment and Care []  - 0 Incontinence Assessment and Management []  - 0 Ostomy Care Assessment and Management (repouching, etc.) PROCESS - Coordination of Care []  - 0 Simple Patient / Family Education for ongoing care X- 1 20 Complex (extensive) Patient / Family Education for ongoing care []  - 0 Staff obtains Chiropractor, Records, T Results / Process Orders est []  - 0 Staff telephones HHA, Nursing Homes / Clarify orders / etc []  - 0 Routine Transfer to another Facility (non-emergent condition) []  - 0 Routine Hospital Admission (non-emergent condition) X- 1 15 New Admissions / Manufacturing engineer / Ordering NPWT Apligraf, etc. , []  - 0 Emergency Hospital Admission (emergent condition) X- 1 10 Simple Discharge Coordination []  - 0 Complex (extensive) Discharge Coordination PROCESS - Special Needs []  - 0 Pediatric / Minor Patient Management []  - 0 Isolation Patient Management []  - 0 Hearing / Language / Visual special needs []  - 0 Assessment of Community assistance (transportation,  D/C planning, etc.) []  - 0 Additional assistance / Altered mentation []  - 0 Support Surface(s) Assessment (bed, cushion, seat, etc.) Cassetta, Kimie R ([id number]) [id number] Page 3 of 10 INTERVENTIONS - Wound Cleansing / Measurement X- 1 5 Wound Imaging (photographs - any number of wounds) []  - 0 Wound Tracing (instead of photographs) []  -  0 Simple Wound Measurement - one wound X- 2 5 Complex Wound Measurement - multiple wounds []  - 0 Simple Wound Cleansing - one wound X- 2 5 Complex Wound Cleansing - multiple wounds INTERVENTIONS - Wound Dressings []  - 0 Small Wound Dressing one or multiple wounds X- 2 15 Medium Wound Dressing one or multiple wounds []  - 0 Large Wound Dressing one or multiple wounds []  - 0 Application of Medications - injection INTERVENTIONS - Miscellaneous []  - 0 External ear exam []  - 0 Specimen Collection (cultures, biopsies, blood, body fluids, etc.) []  - 0 Specimen(s) / Culture(s) sent or taken to Lab for analysis []  - 0 Patient Transfer (multiple staff / Nurse, adult / Similar devices) []  - 0 Simple Staple / Suture removal (25 or less) []  - 0 Complex Staple / Suture removal (26 or more) []  - 0 Hypo / Hyperglycemic Management (close monitor of Blood Glucose) []  - 0 Ankle / Brachial Index (ABI) - do not check if billed separately Has the patient been seen at the hospital within the last three years: Yes Total Score: 155 Level Of Care: New/Established - Level 4 Electronic Signature(s) Signed: 10/22/2023 4:31:34 PM By: Yevonne Pax RN Entered By: Yevonne Pax on 10/22/2023 07:55:52 -------------------------------------------------------------------------------- Encounter Discharge Information Details Patient Name: Date of Service: [name]. 10/22/2023 9:30 A M Medical Record Number: [id number] Patient Account Number: [id number] Date of Birth/Sex: Treating RN: 1981/09/13 (42 y.o. Freddy Finner Primary Care  Angelissa Supan: [name] Other Clinician: Referring Moani Weipert: Treating Arthuro Canelo/Extender: Elinor Parkinson, CO LLIN-JA MA L Weeks in Treatment: 0 Encounter Discharge Information Items Discharge Condition: Stable Ambulatory Status: Ambulatory Discharge Destination: Home Transportation: Private Auto Accompanied By: self Schedule Follow-up Appointment: No Clinical Summary of Care: LUISANA, TRUMBULL ([id number]) [id number] Page 4 of 10 Electronic Signature(s) Signed: 10/22/2023 11:20:53 AM By: Yevonne Pax RN Previous Signature: 10/22/2023 11:20:37 AM Version By: Yevonne Pax RN Entered By: Yevonne Pax on 10/22/2023 08:20:53 -------------------------------------------------------------------------------- Lower Extremity Assessment Details Patient Name: Date of Service: [name]. 10/22/2023 9:30 A M Medical Record Number: [id number] Patient Account Number: [id number] Date of Birth/Sex: Treating RN: [birth year] (42 y.o. Freddy Finner Primary Care Aniayah Alaniz: [name] Other Clinician: Referring Tishina Lown: Treating Kortlynn Poust/Extender: Elinor Parkinson, CO LLIN-JA MA L Weeks in Treatment: 0 Electronic Signature(s) Signed: 10/22/2023 4:31:34 PM By: Yevonne Pax RN Entered By: Yevonne Pax on 10/22/2023 07:12:10 -------------------------------------------------------------------------------- Multi Wound Chart Details Patient Name: Date of Service: [name]. 10/22/2023 9:30 A M Medical Record Number: [id number] Patient Account Number: [id number] Date of Birth/Sex: Treating RN: 06-Sep-1981 (42 y.o. Freddy Finner Primary Care Brinda Focht: [name] Other Clinician: Referring Adisyn Ruscitti: Treating Haru Anspaugh/Extender: Elinor Parkinson, CO LLIN-JA MA L Weeks in Treatment: 0 Vital Signs Height(in): 61 Pulse(bpm): 85 Weight(lbs): 252 Blood Pressure(mmHg): 183/90 Body Mass Index(BMI): 47.6 Temperature(F): 98.4 Respiratory Rate(breaths/min):  18 [1:Photos:] [N/A:N/A] Right Gluteus Right Gluteus N/A Wound Location: Gradually Appeared Gradually Appeared N/A Wounding Event: LAYNEE, TABLER R ([id number]) [id number] Page 5 of 10 Hidradenitis Hidradenitis N/A Primary Etiology: Hypertension, Type II Diabetes Hypertension, Type II Diabetes N/A Comorbid History: 11/26/2018 11/26/2018 N/A Date Acquired: 0 0 N/A Weeks of Treatment: Open Open N/A Wound Status: No No N/A Wound Recurrence: Yes No N/A Clustered Wound: 20x13x1.5 20x13x2 N/A Measurements L x W x D (cm) 204.204 204.204 N/A A (cm) : rea 306.305 408.407 N/A Volume (cm) : Position 1 (o'clock): Yes Yes N/A Tunneling: Full Thickness With Exposed Support Full Thickness With Exposed Support N/A Classification: Structures Structures Medium  Medium N/A Exudate Amount: Purulent Purulent N/A Exudate Type: yellow, brown, green yellow, brown, green N/A Exudate Color: Medium (34-66%) Medium (34-66%) N/A Granulation Amount: Red Red N/A Granulation Quality: Medium (34-66%) Medium (34-66%) N/A Necrotic Amount: Fat Layer (Subcutaneous Tissue): Yes Fat Layer (Subcutaneous Tissue): Yes N/A Exposed Structures: Fascia: No Fascia: No Tendon: No Tendon: No Muscle: No Muscle: No Joint: No Joint: No Bone: No Bone: No None None N/A Epithelialization: Treatment Notes Electronic Signature(s) Signed: 10/22/2023 4:31:34 PM By: Yevonne Pax RN Entered By: Yevonne Pax on 10/22/2023 07:39:10 -------------------------------------------------------------------------------- Multi-Disciplinary Care Plan Details Patient Name: Date of Service: [name]. 10/22/2023 9:30 A M Medical Record Number: [id number] Patient Account Number: [id number] Date of Birth/Sex: Treating RN: 07-17-1981 (42 y.o. Freddy Finner Primary Care Leimomi Zervas: [name] Other Clinician: Referring Sahas Sluka: Treating Ignacio Lowder/Extender: Elinor Parkinson, CO LLIN-JA MA  L Weeks in Treatment: 0 Active Inactive Electronic Signature(s) Signed: 10/22/2023 11:19:22 AM By: Yevonne Pax RN Entered By: Yevonne Pax on 10/22/2023 08:19:22 Fernholz, Felicity Coyer ([id number]) [id number] Page 6 of 10 -------------------------------------------------------------------------------- Pain Assessment Details Patient Name: Date of Service: LOVIS, BONIS 10/22/2023 9:30 A M Medical Record Number: [id number] Patient Account Number: [id number] Date of Birth/Sex: Treating RN: 01-06-1981 (42 y.o. Freddy Finner Primary Care Gilmer Kaminsky: [name] Other Clinician: Referring Gaspar Fowle: Treating Selene Peltzer/Extender: Elinor Parkinson, CO LLIN-JA MA L Weeks in Treatment: 0 Active Problems Location of Pain Severity and Description of Pain Patient Has Paino Yes Site Locations With Dressing Change: Yes Duration of the Pain. Constant / Intermittento Constant Rate the pain. Current Pain Level: 5 Worst Pain Level: 5 Least Pain Level: 1 Tolerable Pain Level: 5 Character of Pain Describe the Pain: Aching, Burning Pain Management and Medication Current Pain Management: Medication: Yes Cold Application: No Rest: Yes Massage: No Activity: No T.E.N.S.: No Heat Application: No Leg drop or elevation: No Is the Current Pain Management Adequate: Inadequate How does your wound impact your activities of daily livingo Sleep: Yes Bathing: No Appetite: Yes Relationship With Others: No Bladder Continence: No Emotions: No Bowel Continence: No Work: No Toileting: No Drive: No Dressing: No Hobbies: No Electronic Signature(s) Signed: 10/22/2023 4:31:34 PM By: Yevonne Pax RN Entered By: Yevonne Pax on 10/22/2023 06:47:57 Kinnett, Codi R ([id number]) [id number] Page 7 of 10 -------------------------------------------------------------------------------- Patient/Caregiver Education Details Patient Name: Date of  Service: LATECHIA, SPERDUTO 11/26/2024andnbsp9:30 A M Medical Record Number: [id number] Patient Account Number: [id number] Date of Birth/Gender: Treating RN: 02-Oct-1981 (42 y.o. Freddy Finner Primary Care Physician: [name] Other Clinician: Referring Physician: Treating Physician/Extender: Elinor Parkinson, CO LLIN-JA MA L Weeks in Treatment: 0 Education Assessment Education Provided To: Patient Education Topics Provided Welcome [facility]New Patient Packet: o Handouts: Welcome T [facility] o Methods: Explain/Verbal Responses: State content correctly Electronic Signature(s) Signed: 10/22/2023 4:31:34 PM By: Yevonne Pax RN Entered By: Yevonne Pax on 10/22/2023 08:19:38 -------------------------------------------------------------------------------- Wound Assessment Details Patient Name: Date of Service: KELE, HRABOVSKY 10/22/2023 9:30 A M Medical Record Number: [id number] Patient Account Number: [id number] Date of Birth/Sex: Treating RN: 1981-10-23 (42 y.o. Freddy Finner Primary Care Jeraline Marcinek: [name] Other Clinician: Referring Livana Yerian: Treating Kadden Osterhout/Extender: Elinor Parkinson, CO LLIN-JA MA L Weeks in Treatment: 0 Wound Status Wound Number: 1 Primary Etiology: Hidradenitis Wound Location: Right Gluteus Wound Status: Open Wounding Event: Gradually Appeared Comorbid History: Hypertension, Type II Diabetes Date Acquired: 11/26/2018 Weeks Of Treatment: 0 Clustered Wound: Yes Photos MANEKA, BALTZELL R ([id number]) [id number] Page 8 of 10 Wound  Measurements Length: (cm) 20 Width: (cm) 13 Depth: (cm) 1.5 Area: (cm) 204.204 Volume: (cm) 306.305 % Reduction in Area: % Reduction in Volume: Epithelialization: None Tunneling: Yes Wound Description Classification: Full Thickness With Exposed Suppo Exudate Amount: Medium Exudate Type: Purulent Exudate Color: yellow, brown, green rt Structures Foul  Odor After Cleansing: No Slough/Fibrino Yes Wound Bed Granulation Amount: Medium (34-66%) Exposed Structure Granulation Quality: Red Fascia Exposed: No Necrotic Amount: Medium (34-66%) Fat Layer (Subcutaneous Tissue) Exposed: Yes Tendon Exposed: No Muscle Exposed: No Joint Exposed: No Bone Exposed: No Electronic Signature(s) Signed: 10/22/2023 4:31:34 PM By: Yevonne Pax RN Entered By: Yevonne Pax on 10/22/2023 07:10:20 -------------------------------------------------------------------------------- Wound Assessment Details Patient Name: Date of Service: PANKIE, TURNBULL 10/22/2023 9:30 A M Medical Record Number: [id number] Patient Account Number: [id number] Date of Birth/Sex: Treating RN: 1981-02-22 (42 y.o. Freddy Finner Primary Care Christia Domke: [name] Other Clinician: Referring Rydan Gulyas: Treating Krithika Tome/Extender: Elinor Parkinson, CO LLIN-JA MA L Weeks in Treatment: 0 Wound Status Wound Number: 2 Primary Etiology: Hidradenitis Wound Location: Right Gluteus Wound Status: Open Wounding Event: Gradually Appeared Comorbid History: Hypertension, Type II Diabetes Date Acquired: 11/26/2018 Weeks Of Treatment: 0 Clustered Wound: No Photos SHAQUASHA, KWOK R ([id number]) [id number] Page 9 of 10 Wound Measurements Length: (cm) 20 Width: (cm) 13 Depth: (cm) 2 Area: (cm) 204.204 Volume: (cm) 408.407 % Reduction in Area: % Reduction in Volume: Epithelialization: None Tunneling: Yes Wound Description Classification: Full Thickness With Exposed Suppo Exudate Amount: Medium Exudate Type: Purulent Exudate Color: yellow, brown, green rt Structures Foul Odor After Cleansing: No Slough/Fibrino Yes Wound Bed Granulation Amount: Medium (34-66%) Exposed Structure Granulation Quality: Red Fascia Exposed: No Necrotic Amount: Medium (34-66%) Fat Layer (Subcutaneous Tissue) Exposed: Yes Necrotic Quality: Adherent Slough Tendon Exposed: No Muscle  Exposed: No Joint Exposed: No Bone Exposed: No Electronic Signature(s) Signed: 10/22/2023 4:31:34 PM By: Yevonne Pax RN Entered By: Yevonne Pax on 10/22/2023 07:11:49 -------------------------------------------------------------------------------- Vitals Details Patient Name: Date of Service: [name]. 10/22/2023 9:30 A M Medical Record Number: [id number] Patient Account Number: [id number] Date of Birth/Sex: Treating RN: 07/30/1981 (42 y.o. Freddy Finner Primary Care Leya Paige: [name] Other Clinician: Referring Gwendolin Briel: Treating Irlanda Croghan/Extender: Elinor Parkinson, CO LLIN-JA MA L Weeks in Treatment: 0 Vital Signs Time Taken: 09:47 Temperature (F): 98.4 Height (in): 61 Pulse (bpm): 85 Source: Stated Respiratory Rate (breaths/min): 18 Weight (lbs): 252 Blood Pressure (mmHg): 183/90 Source: Stated Reference Range: 80 - 120 mg / dl Body Mass Index (BMI): 47.6 Electronic Signature(s) Signed: 10/22/2023 4:31:34 PM By: Yevonne Pax RN Sciarra, Jamieka R ([id number]) [id number] Page 10 of 10 Signed: 10/22/2023 4:31:34 PM By: Yevonne Pax RN Entered By: Yevonne Pax on 10/22/2023 06:49:21

## 2023-10-23 NOTE — Progress Notes (Signed)
Theresa Malone ([id number]) [id number] Page 1 of 7 Visit Report for 10/22/2023 Chief Complaint Document Details Patient Name: Date of Service: Theresa Malone 10/22/2023 9:30 A M Medical Record Number: [id number] Patient Account Number: [id number] Date of Birth/Sex: Treating RN: May 31, 1981 (42 y.o. Theresa Malone Primary Care Provider: [name] Other Clinician: Referring Provider: Treating Provider/Extender: Elinor Parkinson, CO LLIN-JA MA L Weeks in Treatment: 0 Information Obtained from: Patient Chief Complaint Bilateral gluteal hidradenitis and right LE cellulitis Electronic Signature(s) Signed: 10/22/2023 10:33:09 AM By: Theresa Derry PA-C Entered By: Theresa Malone on 10/22/2023 10:33:09 -------------------------------------------------------------------------------- HPI Details Patient Name: Date of Service: Theresa Malone. 10/22/2023 9:30 A M Medical Record Number: [id number] Patient Account Number: [id number] Date of Birth/Sex: Treating RN: [birth year] (42 y.o. Theresa Malone Primary Care Provider: [name] Other Clinician: Referring Provider: Treating Provider/Extender: Elinor Parkinson, CO LLIN-JA MA L Weeks in Treatment: 0 History of Present Illness HPI Description: 10-22-2023 upon evaluation today patient presents for initial inspection here in our clinic concerning issues she has been having with hidradenitis in multiple locations. She was being seen by dermatology and they made a referral to [place] to see if there is anything we can do to help. Unfortunately this individual has been experiencing some significant issues here with hidradenitis and fairly severe pain as well for quite some time I think since she was 42 years old I was told. With that being said I do not see any signs of active infection locally or systemically at this time which is good news. Nonetheless there is some question about an area of cellulitis on the right  lower extremity but she has been previously treated with linezolid the 1 spot that was there is healed there is a spot just below it but again I am unsure whether this is actually related to the hidradenitis or just a cellulitis. She is not currently on doxycycline she has not seen a hidradenitis specialist. Patient does have a history as mentioned above hidradenitis suppurativa, diabetes mellitus type 2 which is out of control at this point, hypertension, and chronic kidney disease stage III. Her most recent hemoglobin A1c was 11.4. Electronic Signature(s) Signed: 10/22/2023 5:32:44 PM By: Theresa Derry PA-C Entered By: Theresa Malone on 10/22/2023 17:32:44 Malone, Theresa Coyer ([id number]) [id number] Page 2 of 7 -------------------------------------------------------------------------------- Physical Exam Details Patient Name: Date of Service: Theresa Malone 10/22/2023 9:30 A M Medical Record Number: [id number] Patient Account Number: [id number] Date of Birth/Sex: Treating RN: May 18, 1981 (42 y.o. Theresa Malone Primary Care Provider: [name] Other Clinician: Referring Provider: Treating Provider/Extender: Elinor Parkinson, CO LLIN-JA MA L Weeks in Treatment: 0 Constitutional patient is hypertensive.. pulse regular and within target range for patient.Marland Kitchen respirations regular, non-labored and within target range for patient.Marland Kitchen temperature within target range for patient.. Obese and well-hydrated in no acute distress. Eyes conjunctiva clear no eyelid edema noted. pupils equal round and reactive to light and accommodation. Ears, Nose, Mouth, and Throat no gross abnormality of ear auricles or external auditory canals. normal hearing noted during conversation. mucus membranes moist. Respiratory normal breathing without difficulty. Musculoskeletal normal gait and posture. no significant deformity or arthritic changes, no loss or range of motion, no  clubbing. Psychiatric this patient is able to make decisions and demonstrates good insight into disease process. Alert and Oriented x 3. pleasant and cooperative. Notes Upon inspection patient's wounds again are classic hidradenitis over the gluteal region she also showed an area under the breast she has areas under  her underarms and in the groin but the area in the gluteal region was the worst. She definitely has evidence of sinus tracts up underneath and again these are extremely painful. I discussed with her that there really is not an obvious an easy way to treat this. I do believe that this is a multifactorial approach that needs to be undertaken by specialist in the area and I recommended a referral to a hidradenitis clinic we can recommend [facility] with him to see if we can make an appointment for her. Electronic Signature(s) Signed: 10/22/2023 5:33:23 PM By: Theresa Derry PA-C Entered By: Theresa Malone on 10/22/2023 17:33:23 -------------------------------------------------------------------------------- Physician Orders Details Patient Name: Date of Service: Theresa Malone 10/22/2023 9:30 A M Medical Record Number: [id number] Patient Account Number: [id number] Date of Birth/Sex: Treating RN: 05-07-1981 (42 y.o. Theresa Malone Primary Care Provider: [name] Other Clinician: Referring Provider: Treating Provider/Extender: Elinor Parkinson, CO LLIN-JA MA L Weeks in Treatment: 0 The following information was scribed by: Theresa Malone ([id number]) [id number] Page 3 of 7 The information was scribed for: Theresa Malone Verbal / Phone Orders: No Diagnosis Coding ICD-10 Coding Code Description L73.2 Hidradenitis suppurativa L03.115 Cellulitis of right lower limb E11.622 Type 2 diabetes mellitus with other skin ulcer I10 Essential (primary) hypertension N18.30 Chronic kidney disease, stage 3 unspecified Discharge From [facility] Services Consult  Only Consults Dermatology - [facility] Hidradenitis Suppurativa and Follicular Disorder - (ICD10 L73.2 - Hidradenitis suppurativa) Patient Medications llergies: Sulfa (Sulfonamide Antibiotics), lisinopril A Notifications Medication Indication Start End 10/22/2023 doxycycline hyclate DOSE 1 - oral 100 mg capsule - 1 capsule oral once daily x 30 days Electronic Signature(s) Signed: 10/22/2023 1:13:23 PM By: Yevonne Pax RN Signed: 10/22/2023 5:34:59 PM By: Theresa Derry PA-C Previous Signature: 10/22/2023 10:47:45 AM Version By: Theresa Derry PA-C Entered By: Yevonne Pax on 10/22/2023 13:13:23 -------------------------------------------------------------------------------- Problem List Details Patient Name: Date of Service: Theresa Malone. 10/22/2023 9:30 A M Medical Record Number: [id number] Patient Account Number: [id number] Date of Birth/Sex: Treating RN: 1981/07/08 (42 y.o. Theresa Malone Primary Care Provider: [name] Other Clinician: Referring Provider: Treating Provider/Extender: Elinor Parkinson, CO LLIN-JA MA L Weeks in Treatment: 0 Active Problems ICD-10 Encounter Code Description Active Date MDM Diagnosis L73.2 Hidradenitis suppurativa 10/22/2023 No Yes L03.115 Cellulitis of right lower limb 10/22/2023 No Yes E11.622 Type 2 diabetes mellitus with other skin ulcer 10/22/2023 No Yes Membreno, Nessa Malone ([id number]) [id number].pdf Page 4 of 7 I10 Essential (primary) hypertension 10/22/2023 No Yes N18.30 Chronic kidney disease, stage 3 unspecified 10/22/2023 No Yes Inactive Problems Resolved Problems Electronic Signature(s) Signed: 10/22/2023 10:32:39 AM By: Theresa Derry PA-C Previous Signature: 10/22/2023 10:31:58 AM Version By: Theresa Derry PA-C Entered By: Theresa Malone on 10/22/2023 10:32:38 -------------------------------------------------------------------------------- Progress Note Details Patient Name: Date of Service: Theresa Malone.  10/22/2023 9:30 A M Medical Record Number: [id number] Patient Account Number: [id number] Date of Birth/Sex: Treating RN: [birth year] (42 y.o. Theresa Malone Primary Care Provider: [name] Other Clinician: Referring Provider: Treating Provider/Extender: Elinor Parkinson, CO LLIN-JA MA L Weeks in Treatment: 0 Subjective Chief Complaint Information obtained from Patient Bilateral gluteal hidradenitis and right LE cellulitis History of Present Illness (HPI) 10-22-2023 upon evaluation today patient presents for initial inspection here in our clinic concerning issues she has been having with hidradenitis in multiple locations. She was being seen by dermatology and they made a referral to [place] to see if there is anything we can do to help. Unfortunately this individual has been  experiencing some significant issues here with hidradenitis and fairly severe pain as well for quite some time I think since she was 42 years old I was told. With that being said I do not see any signs of active infection locally or systemically at this time which is good news. Nonetheless there is some question about an area of cellulitis on the right lower extremity but she has been previously treated with linezolid the 1 spot that was there is healed there is a spot just below it but again I am unsure whether this is actually related to the hidradenitis or just a cellulitis. She is not currently on doxycycline she has not seen a hidradenitis specialist. Patient does have a history as mentioned above hidradenitis suppurativa, diabetes mellitus type 2 which is out of control at this point, hypertension, and chronic kidney disease stage III. Her most recent hemoglobin A1c was 11.4. Patient History Allergies Sulfa (Sulfonamide Antibiotics), lisinopril Social History Never smoker, Marital Status - Married, Alcohol Use - Never, Drug Use - No History, Caffeine Use - Daily. Medical History Cardiovascular Patient has  history of Hypertension Endocrine Patient has history of Type II Diabetes Patient is treated with Insulin, Oral Agents. Blood sugar is not tested. Review of Systems (ROS) Eyes Dosh, Psalm Malone ([id number]) [id number] Page 5 of 7 Complains or has symptoms of Glasses / Contacts. Genitourinary CKD Integumentary (Skin) Complains or has symptoms of Wounds. Denies complaints or symptoms of Bleeding or bruising tendency, Breakdown, Swelling. Objective Constitutional patient is hypertensive.. pulse regular and within target range for patient.Marland Kitchen respirations regular, non-labored and within target range for patient.Marland Kitchen temperature within target range for patient.. Obese and well-hydrated in no acute distress. Vitals Time Taken: 9:47 AM, Height: 61 in, Source: Stated, Weight: 252 lbs, Source: Stated, BMI: 47.6, Temperature: 98.4 F, Pulse: 85 bpm, Respiratory Rate: 18 breaths/min, Blood Pressure: 183/90 mmHg. Eyes conjunctiva clear no eyelid edema noted. pupils equal round and reactive to light and accommodation. Ears, Nose, Mouth, and Throat no gross abnormality of ear auricles or external auditory canals. normal hearing noted during conversation. mucus membranes moist. Respiratory normal breathing without difficulty. Musculoskeletal normal gait and posture. no significant deformity or arthritic changes, no loss or range of motion, no clubbing. Psychiatric this patient is able to make decisions and demonstrates good insight into disease process. Alert and Oriented x 3. pleasant and cooperative. General Notes: Upon inspection patient's wounds again are classic hidradenitis over the gluteal region she also showed an area under the breast she has areas under her underarms and in the groin but the area in the gluteal region was the worst. She definitely has evidence of sinus tracts up underneath and again these are extremely painful. I discussed with her that there really is  not an obvious an easy way to treat this. I do believe that this is a multifactorial approach that needs to be undertaken by specialist in the area and I recommended a referral to a hidradenitis clinic we can recommend [facility] with him to see if we can make an appointment for her. Integumentary (Hair, Skin) Wound #1 status is Open. Original cause of wound was Gradually Appeared. The date acquired was: 11/26/2018. The wound is located on the Left Gluteus. The wound measures 20cm length x 13cm width x 1.5cm depth; 204.204cm^2 area and 306.305cm^3 volume. There is Fat Layer (Subcutaneous Tissue) exposed. There is tunneling at :00. There is a medium amount of purulent drainage noted. There is medium (34-66%) red granulation within the wound  bed. There is a medium (34-66%) amount of necrotic tissue within the wound bed. Wound #2 status is Open. Original cause of wound was Gradually Appeared. The date acquired was: 11/26/2018. The wound is located on the Right Gluteus. The wound measures 20cm length x 13cm width x 2cm depth; 204.204cm^2 area and 408.407cm^3 volume. There is Fat Layer (Subcutaneous Tissue) exposed. There is tunneling at :00. There is a medium amount of purulent drainage noted. There is medium (34-66%) red granulation within the wound bed. There is a medium (34-66%) amount of necrotic tissue within the wound bed including Adherent Slough. Assessment Active Problems ICD-10 Hidradenitis suppurativa Cellulitis of right lower limb Type 2 diabetes mellitus with other skin ulcer Essential (primary) hypertension Chronic kidney disease, stage 3 unspecified Plan Discharge From [facility] Services: Consult Only Consults ordered were: Dermatology - [facility] Hidradenitis Suppurativa and Follicular Disorder The following medication(s) was prescribed: doxycycline hyclate oral 100 mg capsule 1 1 capsule oral once daily x 30 days starting 10/22/2023 UDELL, HASPEL Malone ([id number]) [id number]  Page 6 of 7 1. Based on what I am seeing I am going to recommend making a referral to [facility] for hidradenitis specialist. I think this is gena be the best way to go. 2. I am also going to go ahead and prescribe doxycycline for her I did send this in with refills for up to 1 year in order to try to cut back on the inflammation. 3. I am hopeful that the doxycycline is good to help with some of the inflammation in there for pain but again I think that she really does need to be under the care of a specialist considering her longstanding history with hidradenitis. Patient was seen today as a consult only and there really is not much more from a wound care perspective that I can do at this point I did make the referral to the hidradenitis specialist and I will let her continue to follow-up with her dermatologist subsequently. Electronic Signature(s) Signed: 10/22/2023 5:34:23 PM By: Theresa Derry PA-C Entered By: Theresa Malone on 10/22/2023 17:34:23 -------------------------------------------------------------------------------- ROS/PFSH Details Patient Name: Date of Service: Theresa Malone. 10/22/2023 9:30 A M Medical Record Number: [id number] Patient Account Number: [id number] Date of Birth/Sex: Treating RN: 08/23/1981 (42 y.o. Theresa Malone Primary Care Provider: [name] Other Clinician: Referring Provider: Treating Provider/Extender: Elinor Parkinson, CO LLIN-JA MA L Weeks in Treatment: 0 Eyes Complaints and Symptoms: Positive for: Glasses / Contacts Integumentary (Skin) Complaints and Symptoms: Positive for: Wounds Negative for: Bleeding or bruising tendency; Breakdown; Swelling Cardiovascular Medical History: Positive for: Hypertension Endocrine Medical History: Positive for: Type II Diabetes Time with diabetes: 2006 Treated with: Insulin, Oral agents Blood sugar tested every day: No Genitourinary Complaints and Symptoms: Review of System  Notes: CKD Immunizations Implantable Devices No devices added Family and Social History Never smoker; Marital Status - Married; Alcohol Use: Never; Drug Use: No History; Caffeine Use: Daily DESIA, CAPP Malone ([id number]) [id number] Page 7 of 7 Electronic Signature(s) Signed: 10/22/2023 4:31:34 PM By: Yevonne Pax RN Signed: 10/22/2023 5:34:59 PM By: Theresa Derry PA-C Entered By: Yevonne Pax on 10/22/2023 09:55:43 -------------------------------------------------------------------------------- SuperBill Details Patient Name: Date of Service: Theresa Malone 10/22/2023 Medical Record Number: [id number] Patient Account Number: [id number] Date of Birth/Sex: Treating RN: Jun 27, 1981 (42 y.o. Theresa Malone Primary Care Provider: [name] Other Clinician: Referring Provider: Treating Provider/Extender: Elinor Parkinson, CO LLIN-JA MA L Weeks in Treatment: 0 Diagnosis Coding ICD-10 Codes Code Description L73.2 Hidradenitis suppurativa L03.115 Cellulitis of right  lower limb E11.622 Type 2 diabetes mellitus with other skin ulcer I10 Essential (primary) hypertension N18.30 Chronic kidney disease, stage 3 unspecified Facility Procedures : CPT4 Code: 16109604 Description: 99214 - WOUND CARE VISIT-LEV 4 EST PT Modifier: Quantity: 1 Physician Procedures : CPT4 Code Description Modifier [id number] - WC PHYS LEVEL 4 - NEW PT ICD-10 Diagnosis Description L73.2 Hidradenitis suppurativa L03.115 Cellulitis of right lower limb E11.622 Type 2 diabetes mellitus with other skin ulcer I10 Essential (primary)  hypertension Quantity: 1 Electronic Signature(s) Signed: 10/22/2023 5:34:41 PM By: Theresa Derry PA-C Previous Signature: 10/22/2023 4:31:34 PM Version By: Yevonne Pax RN Entered By: Theresa Malone on 10/22/2023 17:34:40

## 2023-10-28 ENCOUNTER — Telehealth: Payer: Self-pay

## 2023-10-29 ENCOUNTER — Ambulatory Visit: Payer: MEDICAID | Admitting: Dermatology

## 2023-11-01 ENCOUNTER — Ambulatory Visit: Payer: Self-pay

## 2023-11-01 ENCOUNTER — Telehealth: Payer: MEDICAID

## 2023-11-01 NOTE — Telephone Encounter (Signed)
    Chief Complaint: Cough, congestion, headache, body aches. Symptoms: Above Frequency: Tuesday Pertinent Negatives: Patient denies  Disposition: [] ED /[] Urgent Care (no appt availability in office) / [] Appointment(In office/virtual)/ [x]  [facility]/ [] Home Care/ [] Refused Recommended Disposition /[] [place] [facility]/ []  Follow-up with PCP Additional Notes: Agrees with APPOINTMENT.  Reason for Disposition  [1] MILD difficulty breathing (e.g., minimal/no SOB at rest, SOB with walking, pulse <100) AND [2] still present when not coughing  Answer Assessment - Initial Assessment Questions 1. ONSET: "When did the cough begin?"      Tuesday 2. SEVERITY: "How bad is the cough today?"      Severe 3. SPUTUM: "Describe the color of your sputum" (none, dry cough; clear, white, yellow, green)     Green 4. HEMOPTYSIS: "Are you coughing up any blood?" If so ask: "How much?" (flecks, streaks, tablespoons, etc.)     No 5. DIFFICULTY BREATHING: "Are you having difficulty breathing?" If Yes, ask: "How bad is it?" (e.g., mild, moderate, severe)    - MILD: No SOB at rest, mild SOB with walking, speaks normally in sentences, can lie down, no retractions, pulse < 100.    - MODERATE: SOB at rest, SOB with minimal exertion and prefers to sit, cannot lie down flat, speaks in phrases, mild retractions, audible wheezing, pulse 100-120.    - SEVERE: Very SOB at rest, speaks in single words, struggling to breathe, sitting hunched forward, retractions, pulse > 120      Mild at night 6. FEVER: "Do you have a fever?" If Yes, ask: "What is your temperature, how was it measured, and when did it start?"     No 7. CARDIAC HISTORY: "Do you have any history of heart disease?" (e.g., heart attack, congestive heart failure)      No 8. LUNG HISTORY: "Do you have any history of lung disease?"  (e.g., pulmonary embolus, asthma, emphysema)     No 9. PE RISK FACTORS: "Do you have a history of blood clots?" (or:  recent major surgery, recent prolonged travel, bedridden)     No 10. OTHER SYMPTOMS: "Do you have any other symptoms?" (e.g., runny nose, wheezing, chest pain)       Headache, runny nose 11. PREGNANCY: "Is there any chance you are pregnant?" "When was your last menstrual period?"       No 12. TRAVEL: "Have you traveled out of the country in the last month?" (e.g., travel history, exposures)       No  Protocols used: Cough - Acute Non-Productive-A-AH

## 2023-11-04 ENCOUNTER — Ambulatory Visit: Payer: MEDICAID | Admitting: Dermatology

## 2023-12-09 ENCOUNTER — Encounter: Payer: Self-pay | Admitting: Physician Assistant

## 2023-12-09 ENCOUNTER — Ambulatory Visit (INDEPENDENT_AMBULATORY_CARE_PROVIDER_SITE_OTHER): Payer: MEDICAID | Admitting: Physician Assistant

## 2023-12-09 ENCOUNTER — Ambulatory Visit: Payer: MEDICAID | Admitting: Physician Assistant

## 2023-12-09 VITALS — BP 160/90 | HR 103 | Resp 16 | Wt 242.9 lb

## 2023-12-09 DIAGNOSIS — E1122 Type 2 diabetes mellitus with diabetic chronic kidney disease: Secondary | ICD-10-CM | POA: Diagnosis not present

## 2023-12-09 DIAGNOSIS — F419 Anxiety disorder, unspecified: Secondary | ICD-10-CM | POA: Diagnosis not present

## 2023-12-09 DIAGNOSIS — Z794 Long term (current) use of insulin: Secondary | ICD-10-CM

## 2023-12-09 DIAGNOSIS — L732 Hidradenitis suppurativa: Secondary | ICD-10-CM

## 2023-12-09 DIAGNOSIS — E1159 Type 2 diabetes mellitus with other circulatory complications: Secondary | ICD-10-CM | POA: Diagnosis not present

## 2023-12-09 DIAGNOSIS — M1990 Unspecified osteoarthritis, unspecified site: Secondary | ICD-10-CM

## 2023-12-09 DIAGNOSIS — N920 Excessive and frequent menstruation with regular cycle: Secondary | ICD-10-CM

## 2023-12-09 DIAGNOSIS — I152 Hypertension secondary to endocrine disorders: Secondary | ICD-10-CM

## 2023-12-09 MED ORDER — DEXCOM G7 SENSOR MISC: 2 refills | Status: DC

## 2023-12-09 MED ORDER — HYDROXYZINE PAMOATE 25 MG PO CAPS: 25.0000 mg | ORAL_CAPSULE | Freq: Three times a day (TID) | ORAL | 0 refills | Status: AC | PRN

## 2023-12-09 MED ORDER — LANTUS SOLOSTAR 100 UNIT/ML ~~LOC~~ SOPN: 40.0000 [IU] | PEN_INJECTOR | Freq: Two times a day (BID) | SUBCUTANEOUS | 2 refills | Status: DC

## 2023-12-09 MED ORDER — VALSARTAN 40 MG PO TABS: 40.0000 mg | ORAL_TABLET | Freq: Every day | ORAL | 3 refills | Status: DC

## 2023-12-09 NOTE — Progress Notes (Signed)
 Established patient visit  Patient: Theresa Malone   DOB: 09-27-1981   42 y.o. Female  MRN: [id number] Visit Date: 12/09/2023  Today's healthcare provider: Jolynn Spencer, PA-C   Chief Complaint  Patient presents with   Medical Management of Chronic Issues   Subjective       Discussed the use of AI scribe software for clinical note transcription with the patient, who gave verbal consent to proceed.  History of Present Illness   The patient, with a history of diabetes, hypertension, and anxiety, presents with multiple health concerns. The patient reports heavy menstrual cycles, which have not been evaluated by an OBGYN in several years. The patient also reports anxiety, which is currently being managed with Lexapro . However, the patient reports ongoing sleep issues and has previously been on hydroxyzine  and trazodone  for this. The patient's diabetes is poorly controlled, with blood sugars ranging from 200 to 300. The patient is currently on Lantus  and NovoLog  for diabetes management, but reports running out of NovoLog . The patient also has a history of wound care, but reports that the wounds have healed. The patient's hypertension is currently unmanaged, as the patient is not on any blood pressure medication. The patient also expresses interest in getting an insulin  pump and is seeking disability due to health issues.           09/30/2023    3:11 PM 09/13/2023    8:43 AM 05/02/2023    4:13 PM  Depression screen PHQ 2/9  Decreased Interest 0 0 2  Down, Depressed, Hopeless 1 0 2  PHQ - 2 Score 1 0 4  Altered sleeping 0 0 2  Tired, decreased energy 0 0 2  Change in appetite 0 0 2  Feeling bad or failure about yourself  2 0 2  Trouble concentrating 2 0 1  Moving slowly or fidgety/restless 1 0 0  Suicidal thoughts 0 0 0  PHQ-9 Score 6 0 13  Difficult doing work/chores Somewhat difficult  Somewhat difficult      09/30/2023    3:13 PM 07/21/2021   11:36 AM 03/22/2021   10:41 AM  GAD  7 : Generalized Anxiety Score  Nervous, Anxious, on Edge 1 1 0  Control/stop worrying 3 1 1   Worry too much - different things 3 1 1   Trouble relaxing 3 1 1   Restless 0 1 0  Easily annoyed or irritable 2 1 1   Afraid - awful might happen 3 2 1   Total GAD 7 Score 15 8 5   Anxiety Difficulty Somewhat difficult      Medications: Outpatient Medications Prior to Visit  Medication Sig   acetaminophen  (TYLENOL ) 650 MG CR tablet Take 1 tablet (650 mg total) by mouth every 8 (eight) hours as needed for pain.   amLODipine  (NORVASC ) 5 MG tablet Take 1 tablet (5 mg total) by mouth daily.   BD PEN NEEDLE MICRO U/F 32G X 6 MM MISC Inject into the skin daily.   clindamycin -benzoyl peroxide (BENZACLIN) gel Apply topically 2 (two) times daily.   Continuous Glucose Sensor (DEXCOM G7 SENSOR) MISC Change every 10 days use to continuously monitor blood sugar   cyanocobalamin  1000 MCG tablet Take 1 tablet (1,000 mcg total) by mouth daily.   cyclobenzaprine  (FLEXERIL ) 5 MG tablet Take 1 tablet (5 mg total) by mouth 3 (three) times daily as needed for muscle spasms.   escitalopram  (LEXAPRO ) 20 MG tablet Take 40 mg by mouth daily.   folic acid  (FOLVITE ) 1 MG tablet Take  1 tablet (1 mg total) by mouth daily.   gabapentin  (NEURONTIN ) 300 MG capsule Take 2 capsules by mouth twice daily   insulin  aspart (NOVOLOG  FLEXPEN) 100 UNIT/ML FlexPen CBG 70 - 120: 0 units CBG 121 - 150: 1 unit CBG 151 - 200: 2 units CBG 201 - 250: 3 units CBG 251 - 300: 5 units CBG 301 - 350: 7 units CBG 351 - 400: 9 units CBG > 400: call MD   insulin  glargine-yfgn (SEMGLEE ) 100 UNIT/ML injection Inject 0.4 mLs (40 Units total) into the skin 2 (two) times daily.   iron  polysaccharides (NIFEREX) 150 MG capsule Take 1 capsule (150 mg total) by mouth daily.   metoprolol  tartrate (LOPRESSOR ) 50 MG tablet Take 1 tablet (50 mg total) by mouth 2 (two) times daily.   Multiple Vitamin (MULTIVITAMIN WITH MINERALS) TABS tablet Take 1 tablet by mouth  daily.   Vitamin D , Ergocalciferol , (DRISDOL ) 1.25 MG (50000 UNIT) CAPS capsule Take 1 capsule (50,000 Units total) by mouth every 7 (seven) days.   [DISCONTINUED] LANTUS  SOLOSTAR 100 UNIT/ML Solostar Pen SMARTSIG:40 Unit(s) SUB-Q Twice Daily   No facility-administered medications prior to visit.    Review of Systems All negative Except see HPI       Objective    BP (!) 160/90 (BP Location: Left Arm, Patient Position: Sitting, Cuff Size: Large) Comment: manual  Pulse (!) 103   Resp 16   Wt 242 lb 14.4 oz (110.2 kg)   SpO2 100%   BMI 45.90 kg/m     Physical Exam Vitals reviewed.  Constitutional:      General: She is not in acute distress.    Appearance: Normal appearance. She is well-developed. She is obese. She is not diaphoretic.  HENT:     Head: Normocephalic and atraumatic.  Eyes:     General: No scleral icterus.    Conjunctiva/sclera: Conjunctivae normal.  Neck:     Thyroid: No thyromegaly.  Cardiovascular:     Rate and Rhythm: Normal rate and regular rhythm.     Pulses: Normal pulses.     Heart sounds: Normal heart sounds. No murmur heard. Pulmonary:     Effort: Pulmonary effort is normal. No respiratory distress.     Breath sounds: Normal breath sounds. No wheezing, rhonchi or rales.  Musculoskeletal:     Cervical back: Neck supple.     Right lower leg: No edema.     Left lower leg: No edema.  Lymphadenopathy:     Cervical: No cervical adenopathy.  Skin:    General: Skin is warm and dry.     Findings: No rash.  Neurological:     Mental Status: She is alert and oriented to person, place, and time. Mental status is at baseline.  Psychiatric:        Mood and Affect: Mood normal.        Behavior: Behavior normal.      No results found for any visits on 12/09/23.      Assessment and Plan    Hidradenitis Suppurativa Wounds have healed. Patient has been seen by wound care and dermatology. Patient reported adverse effects to prescribed  doxycycline . -Schedule follow-up appointment with dermatology. -Discontinue doxycycline  due to adverse effects.  Type 2 Diabetes Mellitus Chronic Last A1C was 11.4 two months ago. Patient is currently taking Lantus  50 units twice daily. Novolog  has run out. Patient expressed interest in insulin  pump. -Refill Lantus  and Novolog  prescriptions. -Check A1C. -Refer to endocrinology for insulin  pump evaluation.  Hypertension Chronic BP 160/90 - second reading, 182/94 - first reading Patient reported high blood pressure readings. Current medications amlodipine  5mg . -Check blood pressure in two weeks. -Consider prescribing Valsartan +diuretic Trial of valsartan  advised. Adherence to low salt diet and daily exercise advised  Anxiety and Insomnia chronic Patient reported anxiety and difficulty sleeping. Previously prescribed Trazodone  and Hydroxyzine . -Prescribe Hydroxyzine  25mg  twice daily. -Continue escitalopram  20mg  daily Advised to hold on trazodone  considering pt is currently taking escitalopram  due to interaction and side effects.  Heavy Menstrual Bleeding Patient reported heavy menstrual cycles and concern about low iron  levels. -Check iron  levels. -Refer to OBGYN for evaluation of heavy menstrual bleeding.  Arthritis chronic Patient reported arthritis pain. Currently taking gabapentin  and over-the-counter Tylenol  Arthritis. -Continue current treatment.  General Health Maintenance -Order comprehensive blood work. -Advise patient to monitor blood glucose and blood pressure at home and bring records to next appointment. -Advise patient to bring all medications to next appointment for review. -Schedule follow-up appointment in two weeks.      Orders Placed This Encounter  Procedures   Comprehensive metabolic panel    Has the patient fasted?:   Yes   CBC with Differential/Platelet   Lipid panel    Has the patient fasted?:   Yes   TSH    Return in about 2 weeks (around  12/23/2023).   The patient was advised to call back or seek an in-person evaluation if the symptoms worsen or if the condition fails to improve as anticipated.  I discussed the assessment and treatment plan with the patient. The patient was provided an opportunity to ask questions and all were answered. The patient agreed with the plan and demonstrated an understanding of the instructions.  I, Tyshay Adee, PA-C have reviewed all documentation for this visit. The documentation on 12/09/2023  for the exam, diagnosis, procedures, and orders are all accurate and complete.  Jolynn Spencer, [facility], MMS [facility] [phone] (phone) [phone] (fax)  [facility]

## 2023-12-10 ENCOUNTER — Telehealth: Payer: Self-pay

## 2023-12-10 MED ORDER — NOVOLOG FLEXPEN 100 UNIT/ML ~~LOC~~ SOPN: PEN_INJECTOR | SUBCUTANEOUS | 11 refills | Status: DC

## 2023-12-10 NOTE — Telephone Encounter (Signed)
 Copied from CRM [id number]. Topic: General - Other >> Dec 10, 2023 12:54 PM Everette C wrote: Reason for CRM: The patient has called to request contact with a member of staff to follow up regarding their prescription refill requests for insulin  aspart (NOVOLOG  FLEXPEN) 100 UNIT/ML FlexPen [[id number]] and  LANTUS  SOLOSTAR 100 UNIT/ML Solostar Pen [[id number]  The patient has stressed the urgency of their requests for the medication and contact to confirm that the prescriptions have been received by their pharmacy   Please contact the patient when possible

## 2023-12-10 NOTE — Telephone Encounter (Signed)
 I spoke with pt because I received a different CRM stating that she wanted to change PCP.  The [facility]EC agent had already made an appt with Theresa Malone so I changed the PCP to her after okayed by the patient.  She has concerns about her ov with Janna yesterday.  Will route to office manager to f/u with her concerns.

## 2023-12-23 ENCOUNTER — Encounter: Payer: Self-pay | Admitting: Family Medicine

## 2023-12-23 ENCOUNTER — Telehealth (INDEPENDENT_AMBULATORY_CARE_PROVIDER_SITE_OTHER): Payer: MEDICAID | Admitting: Family Medicine

## 2023-12-23 ENCOUNTER — Ambulatory Visit: Payer: MEDICAID | Admitting: Physician Assistant

## 2023-12-23 DIAGNOSIS — G4700 Insomnia, unspecified: Secondary | ICD-10-CM | POA: Insufficient documentation

## 2023-12-23 DIAGNOSIS — M2550 Pain in unspecified joint: Secondary | ICD-10-CM

## 2023-12-23 DIAGNOSIS — F332 Major depressive disorder, recurrent severe without psychotic features: Secondary | ICD-10-CM | POA: Diagnosis not present

## 2023-12-23 DIAGNOSIS — G8929 Other chronic pain: Secondary | ICD-10-CM | POA: Diagnosis not present

## 2023-12-23 DIAGNOSIS — Z794 Long term (current) use of insulin: Secondary | ICD-10-CM

## 2023-12-23 DIAGNOSIS — M2540 Effusion, unspecified joint: Secondary | ICD-10-CM

## 2023-12-23 DIAGNOSIS — I152 Hypertension secondary to endocrine disorders: Secondary | ICD-10-CM

## 2023-12-23 DIAGNOSIS — E1122 Type 2 diabetes mellitus with diabetic chronic kidney disease: Secondary | ICD-10-CM

## 2023-12-23 DIAGNOSIS — E1159 Type 2 diabetes mellitus with other circulatory complications: Secondary | ICD-10-CM

## 2023-12-23 DIAGNOSIS — N1832 Chronic kidney disease, stage 3b: Secondary | ICD-10-CM

## 2023-12-23 MED ORDER — TRAZODONE HCL 50 MG PO TABS: 50.0000 mg | ORAL_TABLET | Freq: Every evening | ORAL | 0 refills | Status: DC | PRN

## 2023-12-23 MED ORDER — AMLODIPINE BESYLATE 5 MG PO TABS: 5.0000 mg | ORAL_TABLET | Freq: Every day | ORAL | 1 refills | Status: DC

## 2023-12-23 MED ORDER — GABAPENTIN 300 MG PO CAPS: 600.0000 mg | ORAL_CAPSULE | Freq: Three times a day (TID) | ORAL | 3 refills | Status: AC

## 2023-12-23 NOTE — Assessment & Plan Note (Deleted)
Pt has concerns for worsening depression and anxiety Would like referral to behavioral health - ordered Continue Lexapro 20mg  daily As needed hydroxyzine 25 mg prn q8, anxiety.

## 2023-12-23 NOTE — Assessment & Plan Note (Signed)
Chronic, uncontrolled Referred to endocrinology for further mgmt Referral to [facility] for assistance with medications while pt waits for endo appt. Last A1c=11.6 Continue to wear CGM Continue insulin aspart with meals Continue Glargine 40 units BID Plan for foot exam at next visit Small frequent meals with protein

## 2023-12-23 NOTE — Assessment & Plan Note (Signed)
Concerns for chronic joint pain and swelling worsening, unable to move hands and elbows per pt via virutal visit Requesting rheumatology referrl - placed Continue as need tylenol and aleve for pain Refill for gabapentin for pain placed, increased to 600mg  TID

## 2023-12-23 NOTE — Assessment & Plan Note (Signed)
Pt has concerns for worsening depression and anxiety Would like referral to behavioral health - ordered Continue Lexapro 20mg  daily As needed hydroxyzine 25 mg prn q8, anxiety.

## 2023-12-23 NOTE — Assessment & Plan Note (Signed)
Pt presents via video/virtual visit for BP check - while not ideal, pt not feeling well enough to present in person Pt checked Bp with at home monitor during virutal visit which read 185/109 - uncontrolled, concerning Pt is asymptomatic - no vision changes, headaches, weakness GOAL SBP<130; DBP<80 Continue to monitor daily at home - record readings Expressed what signs and symptoms of stroke and MI are - when to present to ED Low sodium diet, exercise, weight loss F/u in 2-3 weeks IN person for BP check

## 2023-12-23 NOTE — Assessment & Plan Note (Signed)
Chronic, states can't sleep with out it.  Risks discussed regarding using with lexapro and hydroxyzine, pt aware and would still like prescription as it is the "only thing that helps her sleep" Requesting refill on trazodone 50 mg prn hs - will order.  Per chart review, pt prescribed medication per Joycelyn Man, PA

## 2023-12-23 NOTE — Progress Notes (Signed)
Virtual Visit via Video Note  I connected with Theresa Malone on 12/23/23 at 11:00 AM EST by a video enabled telemedicine application and verified that I am speaking with the correct person using two identifiers.  Patient Location: Home Provider Location: Office/Clinic  I discussed the limitations, risks, security, and privacy concerns of performing an evaluation and management service by video and the availability of in person appointments. I also discussed with the patient that there may be a patient responsible charge related to this service. The patient expressed understanding and agreed to proceed.  Subjective: PCP: Sallee Provencal, FNP  No chief complaint on file.  43 year old female presents via video visit with a history of hypertension, arthritis, obesity, and type 2 diabetes, presents for a follow-up visit per request of [name], PA. Pt requesting different provider as well.   The primary concerns include elevated blood pressure, increased joint stiffness, and difficulty sleeping.   HTN - pt was seen in office with uncontrolled BP two weeks, on amlodipine 5 mg and started on 40mg  of valsartan.  Joint stiffness - particularly in the hands and feet, to the point of being unable to bend the right elbow or move the hands. This has resulted in significant functional impairment and needing a virtual visit today instead of in person. The patient also describes severe nocturnal elbow pain, managed with gabapentin, Aleve, and Tylenol. The patient requests an increase in gabapentin dosage due to persistent pain.  The patient also reports difficulty sleeping and requests a refill of previously prescribed trazodone.   States goes to [employer]", who has continued the patient on Lexapro and hydroxyzine for anxiety and depression.But pt is also requesting to have referral for behavioral health.  The patient's blood pressure has been a concern, with recent readings as high  as 185/109. The patient is currently on valsartan but reports discontinuation of amlodipine and metoprolol for unknown reasons. The patient expresses a desire for better blood pressure control.  DMII - The patient is currently managing diabetes with Lantus and a Novolog sliding scale.The patient has been referred to endocrinology for diabetes management but has not yet been contacted. She was wondering about starting ozempic or mounjaro for her diabetes as well.      ROS: Per HPI  Current Outpatient Medications:    gabapentin (NEURONTIN) 300 MG capsule, Take 2 capsules (600 mg total) by mouth 3 (three) times daily., Disp: 180 capsule, Rfl: 3   traZODone (DESYREL) 50 MG tablet, Take 1 tablet (50 mg total) by mouth at bedtime as needed for sleep., Disp: 90 tablet, Rfl: 0   acetaminophen (TYLENOL) 650 MG CR tablet, Take 1 tablet (650 mg total) by mouth every 8 (eight) hours as needed for pain., Disp: 90 tablet, Rfl: 0   amLODipine (NORVASC) 5 MG tablet, Take 1 tablet (5 mg total) by mouth daily., Disp: 90 tablet, Rfl: 1   BD PEN NEEDLE MICRO U/F 32G X 6 MM MISC, Inject into the skin daily., Disp: , Rfl:    clindamycin-benzoyl peroxide (BENZACLIN) gel, Apply topically 2 (two) times daily., Disp: 25 g, Rfl: 0   Continuous Glucose Sensor (DEXCOM G7 SENSOR) MISC, Change every 10 days use to continuously monitor blood sugar, Disp: 3 each, Rfl: 3   Continuous Glucose Sensor (DEXCOM G7 SENSOR) MISC, Change every 10 days use to continuously monitor blood sugar, Disp: 3 each, Rfl: 2   cyanocobalamin 1000 MCG tablet, Take 1 tablet (1,000 mcg total) by mouth daily., Disp: 30  tablet, Rfl: 0   cyclobenzaprine (FLEXERIL) 5 MG tablet, Take 1 tablet (5 mg total) by mouth 3 (three) times daily as needed for muscle spasms., Disp: 30 tablet, Rfl: 1   escitalopram (LEXAPRO) 20 MG tablet, Take 40 mg by mouth daily., Disp: , Rfl:    folic acid (FOLVITE) 1 MG tablet, Take 1 tablet (1 mg total) by mouth daily., Disp: 30  tablet, Rfl: 0   hydrOXYzine (VISTARIL) 25 MG capsule, Take 1 capsule (25 mg total) by mouth every 8 (eight) hours as needed., Disp: 30 capsule, Rfl: 0   insulin aspart (NOVOLOG FLEXPEN) 100 UNIT/ML FlexPen, CBG 70 - 120: 0 units CBG 121 - 150: 1 unit CBG 151 - 200: 2 units CBG 201 - 250: 3 units CBG 251 - 300: 5 units CBG 301 - 350: 7 units CBG 351 - 400: 9 units CBG > 400: call MD, Disp: 15 mL, Rfl: 11   insulin glargine-yfgn (SEMGLEE) 100 UNIT/ML injection, Inject 0.4 mLs (40 Units total) into the skin 2 (two) times daily., Disp: 10 mL, Rfl: 11   iron polysaccharides (NIFEREX) 150 MG capsule, Take 1 capsule (150 mg total) by mouth daily., Disp: 30 capsule, Rfl: 0   LANTUS SOLOSTAR 100 UNIT/ML Solostar Pen, Inject 40 Units into the skin 2 (two) times daily. SMARTSIG:40 Unit(s) SUB-Q Twice Daily, Disp: 15 mL, Rfl: 2   Multiple Vitamin (MULTIVITAMIN WITH MINERALS) TABS tablet, Take 1 tablet by mouth daily., Disp: 30 tablet, Rfl: 0   valsartan (DIOVAN) 40 MG tablet, Take 1 tablet (40 mg total) by mouth daily., Disp: 90 tablet, Rfl: 3   Vitamin D, Ergocalciferol, (DRISDOL) 1.25 MG (50000 UNIT) CAPS capsule, Take 1 capsule (50,000 Units total) by mouth every 7 (seven) days., Disp: 5 capsule, Rfl: 0  Observations/Objective: There were no vitals filed for this visit. Physical Exam Constitutional:      General: She is not in acute distress.    Appearance: Normal appearance. She is obese. She is not ill-appearing, toxic-appearing or diaphoretic.  Eyes:     Extraocular Movements: Extraocular movements intact.  Pulmonary:     Effort: Pulmonary effort is normal.  Skin:    General: Skin is dry.  Neurological:     General: No focal deficit present.     Mental Status: She is alert and oriented to person, place, and time. Mental status is at baseline.  Psychiatric:        Mood and Affect: Mood normal. Affect is labile.        Speech: Speech normal.        Behavior: Behavior normal.        Thought  Content: Thought content normal.        Cognition and Memory: Cognition and memory normal.        Judgment: Judgment normal.    Assessment and Plan: Severe episode of recurrent major depressive disorder, without psychotic features (HCC) Assessment & Plan: Pt has concerns for worsening depression and anxiety Would like referral to behavioral health - ordered Continue Lexapro 20mg  daily As needed hydroxyzine 25 mg prn q8, anxiety.  Orders: -     Ambulatory referral to Psychiatry  Joint swelling -     Ambulatory referral to Rheumatology  Chronic joint pain Assessment & Plan: Concerns for chronic joint pain and swelling worsening, unable to move hands and elbows per pt via virutal visit Requesting rheumatology referrl - placed Continue as need tylenol and aleve for pain Refill for gabapentin for pain placed, increased  to 600mg  TID  Orders: -     Ambulatory referral to Rheumatology -     Gabapentin; Take 2 capsules (600 mg total) by mouth 3 (three) times daily.  Dispense: 180 capsule; Refill: 3  Hypertension associated with diabetes (HCC) Assessment & Plan: Pt presents via video/virtual visit for BP check - while not ideal, pt not feeling well enough to present in person Pt checked Bp with at home monitor during virutal visit which read 185/109 - uncontrolled, concerning Pt is asymptomatic - no vision changes, headaches, weakness GOAL SBP<130; DBP<80 Continue to monitor daily at home - record readings Expressed what signs and symptoms of stroke and MI are - when to present to ED Low sodium diet, exercise, weight loss F/u in 2-3 weeks IN person for BP check  Orders: -     amLODIPine Besylate; Take 1 tablet (5 mg total) by mouth daily.  Dispense: 90 tablet; Refill: 1  Insomnia, unspecified type Assessment & Plan: Chronic, states can't sleep with out it.  Risks discussed regarding using with lexapro and hydroxyzine, pt aware and would still like prescription as it is the "only  thing that helps her sleep" Requesting refill on trazodone 50 mg prn hs - will order.  Per chart review, pt prescribed medication per Joycelyn Man, PA  Orders: -     traZODone HCl; Take 1 tablet (50 mg total) by mouth at bedtime as needed for sleep.  Dispense: 90 tablet; Refill: 0  Type 2 diabetes mellitus with stage 3b chronic kidney disease, with long-term current use of insulin (HCC) Assessment & Plan: Chronic, uncontrolled Referred to endocrinology for further mgmt Referral to [facility] for assistance with medications while pt waits for endo appt. Last A1c=11.6 Continue to wear CGM Continue insulin aspart with meals Continue Glargine 40 units BID Plan for foot exam at next visit Small frequent meals with protein  Orders: -     AMB Referral [facility] Care Management   Follow Up Instructions: Return in about 3 weeks (around 01/13/2024) for BP check in person.   I discussed the assessment and treatment plan with the patient. The patient was provided an opportunity to ask questions, and all were answered. The patient agreed with the plan and demonstrated an understanding of the instructions.   The patient was advised to call back or seek an in-person evaluation if the symptoms worsen or if the condition fails to improve as anticipated.  The above assessment and management plan was discussed with the patient. The patient verbalized understanding of and has agreed to the management plan.   Sallee Provencal, FNP

## 2023-12-24 ENCOUNTER — Encounter: Payer: Self-pay | Admitting: Internal Medicine

## 2023-12-24 ENCOUNTER — Telehealth: Payer: Self-pay

## 2023-12-24 NOTE — Progress Notes (Signed)
Care Guide Pharmacy Note  12/24/2023 Name: Theresa Malone MRN: [id number] DOB: March 09, 1981  Referred By: Sallee Provencal, FNP Reason for referral: Care Coordination (Outreach to schedule with Pharm d )   Theresa Malone is a 43 y.o. year old female who is a primary care patient of Sallee Provencal, FNP.  Theresa Malone was referred to the pharmacist for assistance related to: HTN and DMII  Successful contact was made with the patient to discuss pharmacy services including being ready for the pharmacist to call at least 5 minutes before the scheduled appointment time and to have medication bottles and any blood pressure readings ready for review. The patient agreed to meet with the pharmacist via telephone visit on (date/time).01/07/2024  Penne Lash , RMA     [place]  [facility], [facility] Guide  Direct Dial: [phone]  Website: [email or web address]

## 2023-12-31 ENCOUNTER — Ambulatory Visit: Payer: Self-pay | Admitting: *Deleted

## 2023-12-31 ENCOUNTER — Ambulatory Visit: Payer: MEDICAID | Admitting: Family Medicine

## 2023-12-31 ENCOUNTER — Encounter: Payer: Self-pay | Admitting: Family Medicine

## 2023-12-31 VITALS — BP 178/103 | HR 101 | Ht 61.0 in | Wt 250.5 lb

## 2023-12-31 DIAGNOSIS — M2540 Effusion, unspecified joint: Secondary | ICD-10-CM | POA: Diagnosis not present

## 2023-12-31 DIAGNOSIS — M2550 Pain in unspecified joint: Secondary | ICD-10-CM

## 2023-12-31 MED ORDER — PREDNISONE 10 MG PO TABS: ORAL_TABLET | ORAL | 0 refills | Status: DC

## 2023-12-31 NOTE — Telephone Encounter (Signed)
 Hand pain Reason for Disposition  [1] SEVERE pain (e.g., excruciating, unable to do any normal activities) AND [2] not improved 2 hours after pain medicine  Answer Assessment - Initial Assessment Questions 1. ONSET: When did the muscle aches or body pains start?      Saturday- pain got worse 2. LOCATION: What part of your body is hurting? (e.g., entire body, arms, legs)      Finger joints, leg-thigh, lower back, shoulders feet 3. SEVERITY: How bad is the pain? (Scale 1-10; or mild, moderate, severe)   - MILD (1-3): doesn't interfere with normal activities    - MODERATE (4-7): interferes with normal activities or awakens from sleep    - SEVERE (8-10):  excruciating pain, unable to do any normal activities      10/10 4. CAUSE: What do you think is causing the pains?     Rheumatoid arthritis 5. FEVER: Have you been having fever?     no 6. OTHER SYMPTOMS: Do you have any other symptoms? (e.g., chest pain, weakness, rash, cold or flu symptoms, weight loss)     Unable to move, self care-bathing ,etc  Protocols used: Muscle Aches and Body Pain-A-AH

## 2023-12-31 NOTE — Progress Notes (Signed)
 Acute visit   Patient: Theresa Malone   DOB: 24-Apr-1981   43 y.o. Female  MRN: [id number]  Chief Complaint  Patient presents with   Arthritis    Patient present due to flare. Reports it started Saturday and caused her to leave work early, missed work Sunday and yesterday as well as today. Reports pain is severe and at a 10. Patient states she almost fell coming down stairs due to not being able to lift legs up good. Pt is taking tylenol  3 times a day along with her gabapentin  rx with minor relief.    Subjective    Discussed the use of AI scribe software for clinical note transcription with the patient, who gave verbal consent to proceed.  History of Present Illness   The patient, with a history of arthritis and chronic kidney disease, presents with a flare-up of arthritis affecting multiple joints. The patient reports experiencing pain in the neck, back, shoulders, elbows, wrists, fingers, knees, toes, ankles, and hips. The patient describes the pain as severe and debilitating, leading to functional impairment and difficulty performing daily activities, including work. The patient recently started a new job at [employer], which involves increased physical activity and standing for extended periods. This has exacerbated the arthritis symptoms, leading to increased pain and discomfort. The patient had to leave work early due to severe pain and has had to call out of work on multiple occasions. The patient is currently taking gabapentin  three times a day for pain management, which provides temporary relief. The patient also reports feeling depressed and anxious due to the chronic pain and functional impairment.        Review of Systems  Objective    BP (!) 178/103 (BP Location: Left Arm, Patient Position: Sitting, Cuff Size: Normal)   Pulse (!) 101   Ht 5' 1 (1.549 m)   Wt 250 lb 8 oz (113.6 kg)   SpO2 100%   BMI 47.33 kg/m  Physical Exam Vitals reviewed.  Constitutional:       General: She is not in acute distress.    Appearance: Normal appearance. She is well-developed. She is not diaphoretic.  HENT:     Head: Normocephalic and atraumatic.  Neck:     Thyroid: No thyromegaly.  Cardiovascular:     Rate and Rhythm: Normal rate and regular rhythm.     Heart sounds: Normal heart sounds. No murmur heard. Pulmonary:     Effort: Pulmonary effort is normal. No respiratory distress.     Breath sounds: Normal breath sounds. No wheezing, rhonchi or rales.  Musculoskeletal:     Right lower leg: No edema.     Left lower leg: No edema.     Comments: Erythema and swelling of bilateral hands and feet, particularly of joints.  Limited extension of R elbow. Effusion of blateral knees.  Skin:    General: Skin is warm and dry.     Findings: No rash.  Neurological:     Mental Status: She is alert and oriented to person, place, and time. Mental status is at baseline.     Gait: Gait abnormal (antalgic).  Psychiatric:        Mood and Affect: Mood normal.        Behavior: Behavior normal.       No results found for any visits on 12/31/23.  Assessment & Plan     Problem List Items Addressed This Visit   None Visit Diagnoses  Polyarthralgia    -  Primary   Relevant Orders   Rheumatoid factor   CYCLIC CITRUL PEPTIDE ANTIBODY, IGG/IGA   Sedimentation rate   C-reactive protein     Joint swelling       Relevant Orders   Rheumatoid factor   CYCLIC CITRUL PEPTIDE ANTIBODY, IGG/IGA   Sedimentation rate   C-reactive protein       Assessment and Plan    Arthritis, concern for possible rheum condition Presents with a flare affecting multiple joints including neck, back, shoulders, elbows, wrists, fingers, knees, toes, ankles, and hips. Symptoms exacerbated by physical activity. Previous labs showed elevated inflammatory markers (sed rate and CRP) and negative ANA. Differential diagnosis includes rheumatoid arthritis and psoriatic arthritis. Physical exam reveals  joint tenderness, effusion, and contractures. Rheumatology referral in process. Discussed prednisone  for symptomatic relief, noting its anti-inflammatory effects and potential side effects. Advised to take prednisone  in the morning with food to avoid gastrointestinal upset and insomnia. Emphasized avoiding NSAIDs due to chronic kidney disease and the need for follow-up with rheumatology for targeted treatment. - Prescribe prednisone  with a tapering dose starting at 40 mg for 4 days, then 30 mg for 4 days, 20 mg for 4 days, 10 mg for 4 days, and 5 mg for 4 days. Take in the morning with food. - Continue gabapentin  three times a day. - Use acetaminophen  for additional pain relief as needed. - Avoid NSAIDs due to kidney function concerns. - Order labs: CCP, rheumatoid factor, sed rate, and CRP. - Follow up with rheumatology for targeted treatment.  Chronic Kidney Disease Chronic kidney disease contraindicates the use of NSAIDs. Discussed the risks of NSAIDs with compromised kidney function and the importance of using acetaminophen  for pain management instead. - Avoid NSAIDs. - Use acetaminophen  for pain management.  Depression and Anxiety Reports feeling depressed and anxious, exacerbated by her physical condition and inability to perform daily activities. PHQ-9 and GAD-7 scores indicate moderate depression and anxiety. Discussed the potential for FMLA to protect job security and the importance of follow-up with primary care or mental health specialist for ongoing management. - Discuss the potential for FMLA to protect job security. Patient will look into it and follow up with PCP - Follow up with primary care or mental health specialist for ongoing management of depression and anxiety.  Follow-up - Follow up with rheumatology for targeted treatment. - Follow up with primary care or mental health specialist for depression and anxiety. - Provide work note excusing patient from work on 12/29/2023 and  12/31/2023. - Send prescription to [facility] on [address]. - Draw labs in the clinic today.         Meds ordered this encounter  Medications   predniSONE  (DELTASONE ) 10 MG tablet    Sig: Take 40mg  PO daily x4d, then 30mg  daily x4d, then 20mg  daily x4d, then 10mg  daily x4 d, then 5mg  daily x4d    Dispense:  42 tablet    Refill:  0     Return if symptoms worsen or fail to improve.      Jon Eva, MD  [facility] [phone] (phone) [phone] (fax)  [facility]

## 2023-12-31 NOTE — Telephone Encounter (Signed)
  Chief Complaint: rheumatoid arthritis  Symptoms: pain 10/10- missing work, pain is in all joints Frequency: started Saturday Pertinent Negatives: Patient denies fever Disposition: [] ED /[] Urgent Care (no appt availability in office) / [x] Appointment(In office/virtual)/ []  [facility]/ [] Home Care/ [] Refused Recommended Disposition /[] [facility]/ []  Follow-up with PCP Additional Notes: Chronic joint pain- flare- patient states she is hurting all over. Patient states her pain is 10/10 today- unable to move,ADL are difficult

## 2024-01-02 ENCOUNTER — Encounter: Payer: Self-pay | Admitting: Certified Nurse Midwife

## 2024-01-02 ENCOUNTER — Encounter: Payer: MEDICAID | Admitting: Certified Nurse Midwife

## 2024-01-02 ENCOUNTER — Encounter: Payer: Self-pay | Admitting: Family Medicine

## 2024-01-02 DIAGNOSIS — N926 Irregular menstruation, unspecified: Secondary | ICD-10-CM

## 2024-01-02 LAB — C-REACTIVE PROTEIN: CRP: 70 mg/L — ABNORMAL HIGH (ref 0–10)

## 2024-01-02 LAB — SEDIMENTATION RATE: Sed Rate: 85 mm/h — ABNORMAL HIGH (ref 0–32)

## 2024-01-02 LAB — CYCLIC CITRUL PEPTIDE ANTIBODY, IGG/IGA: Cyclic Citrullin Peptide Ab: 7 U (ref 0–19)

## 2024-01-02 LAB — RHEUMATOID FACTOR: Rheumatoid fact SerPl-aCnc: 12.3 [IU]/mL (ref <14.0)

## 2024-01-07 ENCOUNTER — Other Ambulatory Visit: Payer: MEDICAID | Admitting: Pharmacist

## 2024-01-07 ENCOUNTER — Telehealth: Payer: Self-pay | Admitting: Pharmacist

## 2024-01-07 ENCOUNTER — Encounter: Payer: Self-pay | Admitting: Certified Nurse Midwife

## 2024-01-07 NOTE — Progress Notes (Signed)
Patient did NOT pick-up the phone today for scheduled call. Unable to leave voicemail requesting call back due to mailbox being full. Left helpful information below in preparation for re-scheduled visit.       01/07/2024 Name: Theresa Malone MRN: [id number] DOB: Jan 25, 1981  Chief Complaint  Patient presents with   Medication Management   Diabetes   Hypertension    Theresa Malone is a 43 y.o. year old female who presented for a telephone visit.   They were referred to the pharmacist by their PCP for assistance in managing diabetes and hypertension.    Subjective:  Care Team: Primary Care Provider: Sallee Provencal, FNP ; Next Scheduled Visit: 01/14/24 Clinical Pharmacist: Marlowe Aschoff, PharmD  Medication Access/Adherence  Current Pharmacy:  [facility] [address]), [place] - [address] [place] ([address]) [place] [address] Phone: [phone] Fax: [phone]  [facility] [address], [place] - [address] [place] [address] Phone: [phone] Fax: [phone]   Patient reports affordability concerns with their medications:  Unknown Patient reports access/transportation concerns to their pharmacy:  Unknown Patient reports adherence concerns with their medications:   Unknown     Diabetes:  Current medications: Novolog with sliding scale, Lantus 40 U twice a day (80 U total) Medications tried in the past: Glyburide 5mg , Victoza, Trulicity, Tradjenta, Metformin max dose  Using Dexcom G7- need info hooked up to [facility] Clarity account for tracking   Patient denies hypoglycemic s/sx including dizziness, shakiness, sweating. Patient denies hyperglycemic symptoms including polyuria, polydipsia, polyphagia, nocturia, neuropathy, blurred vision.  Current meal patterns:  - Breakfast:  - Lunch  - Supper  - Snacks  - Drinks   Current physical activity:   Current medication access support:  [employer]    Hypertension:  Current medications: Amlodipine 5mg  daily, Valsartan 40mg  daily (max of 320mg ; 40-160 is maintenance dose) Medications previously tried: Hydralazine 50mg  (told to restart Metoprolol first), hydrochlorothiazide 25mg , Labetalol 200mg  (pregnancy only), Losartan 50mg  (hyperkalemia), Losartan-HTZ 50-12.5mg . Lopressor 50mg , Triamterene-hydrochlorothiazide 75-50mg   Patient has a validated, automated, upper arm home BP cuff Current blood pressure readings readings:   Patient denies hypotensive s/sx including dizziness, lightheadedness.  Patient denies hypertensive symptoms including headache, chest pain, shortness of breath    Objective:  Lab Results  Component Value Date   HGBA1C 11.4 (H) 09/21/2023    Lab Results  Component Value Date   CREATININE 1.43 (H) 09/30/2023   BUN 22 09/30/2023   NA 132 (L) 09/30/2023   K 5.4 (H) 09/30/2023   CL 97 (L) 09/30/2023   CO2 27 09/30/2023    Lab Results  Component Value Date   CHOL 214 (H) 02/15/2023   HDL 62 02/15/2023   LDLCALC 113 (H) 02/15/2023   TRIG 229 (H) 02/15/2023   CHOLHDL 3.5 02/15/2023    Medications Reviewed Today   Medications were not reviewed in this encounter       Assessment/Plan:   Diabetes: - Currently uncontrolled - Reviewed long term cardiovascular and renal outcomes of uncontrolled blood sugar - Reviewed goal A1c, goal fasting, and goal 2 hour post prandial glucose - Reviewed dietary modifications including:  - Reviewed lifestyle modifications including:  - Patient denies personal or family history of multiple endocrine neoplasia type 2, medullary thyroid cancer; personal history of pancreatitis or gallbladder disease. - Recommend to check glucose continuously with Dexcom G7    Hypertension: - Currently uncontrolled - Reviewed long term cardiovascular and renal  outcomes of uncontrolled blood pressure - Reviewed appropriate blood pressure monitoring technique  and reviewed goal blood pressure. Recommended to check home blood pressure and heart rate daily - Recommend to minimize salt intake   Follow Up Plan:  - Remove Semglee and Vitamin D from med list - Get Dexcom G7 hooked up to [employer] if using app - Get BP + BG readings - Discuss Ozempic or Trulicity- preferred products - Recommend Toprol XL for BP due to elevated pulse- need daily meds for better adherence? - Increase Valsartan dose     Marlowe Aschoff, PharmD [facility] Phone Number: [phone]

## 2024-01-14 ENCOUNTER — Ambulatory Visit: Payer: Self-pay | Admitting: Family Medicine

## 2024-01-14 ENCOUNTER — Telehealth: Payer: Self-pay | Admitting: Family Medicine

## 2024-01-14 NOTE — Telephone Encounter (Signed)
 Medication Refill -  Most Recent Primary Care Visit:  Provider: Charlcie Cradle A  Department: [id number]  Visit Type: MYCHART VIDEO VISIT  Date: 12/23/2023  Medication: predniSONE (DELTASONE) 10 MG tablet   Has the patient contacted their pharmacy? Yes Contact the office   Is this the correct pharmacy for this prescription? Yes This is the patient's preferred pharmacy:  [facility] [address] ([address]), [place] - [address] [place]) [place] [address] Phone: [phone] Fax: [phone]   Has the prescription been filled recently? Yes  Is the patient out of the medication? No  Has the patient been seen for an appointment in the last year OR does the patient have an upcoming appointment? Yes  Can we respond through MyChart? Yes  Agent: Please be advised that Rx refills may take up to 3 business days. We ask that you follow-up with your pharmacy.

## 2024-01-14 NOTE — Telephone Encounter (Signed)
 Patient called and advised prednisone is usually prescribed short term for the tapering dose, asked if she's still having symptoms. She says when she saw the other provider, Dr. Beryle Flock, she was under the impression she would be on prednisone until she can see the rheumatologist and she has not heard back about her lab results.Pt given lab results per notes of Dr. Beryle Flock on 01/02/24. Pt verbalized understanding. She asked does that mean she has RA, advised it's no diagnosis noted and that she will need to f/u with the rheumatologist. Advised to scheduled visit with PCP to discuss labs and refill, she agreed, scheduled MyChart Video tomorrow morning.

## 2024-01-14 NOTE — Telephone Encounter (Signed)
 Patient called, mailbox is full. If she returns the call, will need to know why she needs a refill on prednisone, is she having symptoms. Send the call to NT.

## 2024-01-15 ENCOUNTER — Encounter: Payer: Self-pay | Admitting: Family Medicine

## 2024-01-15 ENCOUNTER — Telehealth: Payer: MEDICAID | Admitting: Family Medicine

## 2024-01-15 DIAGNOSIS — M2540 Effusion, unspecified joint: Secondary | ICD-10-CM | POA: Insufficient documentation

## 2024-01-15 DIAGNOSIS — M2550 Pain in unspecified joint: Secondary | ICD-10-CM

## 2024-01-15 DIAGNOSIS — R5381 Other malaise: Secondary | ICD-10-CM | POA: Diagnosis not present

## 2024-01-15 NOTE — Assessment & Plan Note (Signed)
 D/t chronic pain and swelling Affects her daily life and job She is concerned she will be unable to keep her current job d/t symptoms Recommend discussing FMLA options with management

## 2024-01-15 NOTE — Assessment & Plan Note (Signed)
 Persistent inflammation noted on labs.  Symptom relief with prednisone, but nearing end of course.  Pain managed also with gabapentin and Tylenol.  Rheumatology consult scheduled for 06/16/2024. -Continue gabapentin 600mg  TID and Tylenol extra strength every 4 hours (max 6/day). -Complete current course of prednisone. -Discussed potential need for longer term steroid use, but should be managed by specialist, rheumatology -Encourage patient to keep rheumatology appointment and to stay on waitlist for potential earlier appointment.

## 2024-01-15 NOTE — Telephone Encounter (Signed)
 Pt seen today via virtual - discussed lab results and need for further eval from rhem for steroid mgmt long term.

## 2024-01-15 NOTE — Progress Notes (Signed)
 Virtual Visit via Video Note  I connected with Theresa Malone on 01/15/24 at  9:00 AM EST by a video enabled telemedicine application and verified that I am speaking with the correct person using two identifiers.  Patient Location: Home Provider Location: Office/Clinic  I discussed the limitations, risks, security, and privacy concerns of performing an evaluation and management service by video and the availability of in person appointments. I also discussed with the patient that there may be a patient responsible charge related to this service. The patient expressed understanding and agreed to proceed.  Subjective: PCP: Sallee Provencal, FNP  No chief complaint on file.  Theresa Malone is a 43 year old female who presents for f/u on joint swelling due to persistent inflammation and lab results.  She has been taking prednisone and feels fine while on it, but is concerned about returning to 'square one' once the medication is finished. She has two pills left, which she plans to take today and tomorrow. She is also taking gabapentin 600 mg TID and Tylenol extra strength every four hours, ensuring not to exceed six doses per day. Previously, she was on Tylenol, gabapentin, and tramadol, but currently, she is only taking gabapentin and Tylenol along with prednisone.  Previous blood tests showed elevated rheumatoid arthritis factor, which normalized during her last visit on February 4th, though inflammation is still present. She is curious about the type of arthritis she might have, as she was informed that blood tests could help pinpoint the specific type.  Her symptoms worsen when she is at work, where she is on her feet for eight hours with two 15-minute breaks and an hour lunch. By the end of her shift, she feels as though she is 'almost crawling up out of there.' She has started working at [employer] and is considering applying for disability due to the severity of her symptoms at  work.   ROS: Per HPI  Current Outpatient Medications:    acetaminophen (TYLENOL) 650 MG CR tablet, Take 1 tablet (650 mg total) by mouth every 8 (eight) hours as needed for pain., Disp: 90 tablet, Rfl: 0   amLODipine (NORVASC) 5 MG tablet, Take 1 tablet (5 mg total) by mouth daily., Disp: 90 tablet, Rfl: 1   BD PEN NEEDLE MICRO U/F 32G X 6 MM MISC, Inject into the skin daily., Disp: , Rfl:    clindamycin-benzoyl peroxide (BENZACLIN) gel, Apply topically 2 (two) times daily., Disp: 25 g, Rfl: 0   Continuous Glucose Sensor (DEXCOM G7 SENSOR) MISC, Change every 10 days use to continuously monitor blood sugar, Disp: 3 each, Rfl: 3   Continuous Glucose Sensor (DEXCOM G7 SENSOR) MISC, Change every 10 days use to continuously monitor blood sugar, Disp: 3 each, Rfl: 2   cyanocobalamin 1000 MCG tablet, Take 1 tablet (1,000 mcg total) by mouth daily., Disp: 30 tablet, Rfl: 0   cyclobenzaprine (FLEXERIL) 5 MG tablet, Take 1 tablet (5 mg total) by mouth 3 (three) times daily as needed for muscle spasms., Disp: 30 tablet, Rfl: 1   escitalopram (LEXAPRO) 20 MG tablet, Take 40 mg by mouth daily., Disp: , Rfl:    folic acid (FOLVITE) 1 MG tablet, Take 1 tablet (1 mg total) by mouth daily., Disp: 30 tablet, Rfl: 0   gabapentin (NEURONTIN) 300 MG capsule, Take 2 capsules (600 mg total) by mouth 3 (three) times daily., Disp: 180 capsule, Rfl: 3   hydrOXYzine (VISTARIL) 25 MG capsule, Take 1 capsule (25 mg total)  by mouth every 8 (eight) hours as needed., Disp: 30 capsule, Rfl: 0   insulin aspart (NOVOLOG FLEXPEN) 100 UNIT/ML FlexPen, CBG 70 - 120: 0 units CBG 121 - 150: 1 unit CBG 151 - 200: 2 units CBG 201 - 250: 3 units CBG 251 - 300: 5 units CBG 301 - 350: 7 units CBG 351 - 400: 9 units CBG > 400: call MD, Disp: 15 mL, Rfl: 11   insulin glargine-yfgn (SEMGLEE) 100 UNIT/ML injection, Inject 0.4 mLs (40 Units total) into the skin 2 (two) times daily., Disp: 10 mL, Rfl: 11   iron polysaccharides (NIFEREX) 150 MG  capsule, Take 1 capsule (150 mg total) by mouth daily., Disp: 30 capsule, Rfl: 0   LANTUS SOLOSTAR 100 UNIT/ML Solostar Pen, Inject 40 Units into the skin 2 (two) times daily. SMARTSIG:40 Unit(s) SUB-Q Twice Daily, Disp: 15 mL, Rfl: 2   Multiple Vitamin (MULTIVITAMIN WITH MINERALS) TABS tablet, Take 1 tablet by mouth daily., Disp: 30 tablet, Rfl: 0   predniSONE (DELTASONE) 10 MG tablet, Take 40mg  PO daily x4d, then 30mg  daily x4d, then 20mg  daily x4d, then 10mg  daily x4 d, then 5mg  daily x4d, Disp: 42 tablet, Rfl: 0   traZODone (DESYREL) 50 MG tablet, Take 1 tablet (50 mg total) by mouth at bedtime as needed for sleep., Disp: 90 tablet, Rfl: 0   valsartan (DIOVAN) 40 MG tablet, Take 1 tablet (40 mg total) by mouth daily., Disp: 90 tablet, Rfl: 3   Vitamin D, Ergocalciferol, (DRISDOL) 1.25 MG (50000 UNIT) CAPS capsule, Take 1 capsule (50,000 Units total) by mouth every 7 (seven) days., Disp: 5 capsule, Rfl: 0  Observations/Objective: There were no vitals filed for this visit. Physical Exam Vitals reviewed: virtual visit, no vitals available.  Constitutional:      General: She is not in acute distress.    Appearance: Normal appearance. She is obese. She is not ill-appearing, toxic-appearing or diaphoretic.  Eyes:     Extraocular Movements: Extraocular movements intact.  Pulmonary:     Effort: Pulmonary effort is normal. No respiratory distress.  Musculoskeletal:     Cervical back: Normal range of motion.  Skin:    General: Skin is dry.  Neurological:     General: No focal deficit present.     Mental Status: She is alert and oriented to person, place, and time. Mental status is at baseline.  Psychiatric:        Mood and Affect: Mood normal.        Behavior: Behavior normal.        Thought Content: Thought content normal.        Judgment: Judgment normal.     Assessment and Plan: Polyarthralgia Assessment & Plan: Persistent inflammation noted on labs.  Symptom relief with  prednisone, but nearing end of course.  Pain managed also with gabapentin and Tylenol.  Rheumatology consult scheduled for 06/16/2024. -Continue gabapentin 600mg  TID and Tylenol extra strength every 4 hours (max 6/day). -Complete current course of prednisone. -Discussed potential need for longer term steroid use, but should be managed by specialist, rheumatology -Encourage patient to keep rheumatology appointment and to stay on waitlist for potential earlier appointment.   Joint swelling Assessment & Plan: Chronic, improved with tapered steroid pack  Continue TID gabapentin 600mg  and as needed tylenol 500mg   Pt has rhem appt on 06/16/24 - please keep.    Disability affecting daily living Assessment & Plan: D/t chronic pain and swelling Affects her daily life and job She is concerned  she will be unable to keep her current job d/t symptoms Recommend discussing FMLA options with management     Follow Up Instructions: Return for pt has schedule visit with provider on 01/23/24 to discuss BM and DMII mgmt.   I discussed the assessment and treatment plan with the patient. The patient was provided an opportunity to ask questions, and all were answered. The patient agreed with the plan and demonstrated an understanding of the instructions.   The patient was advised to call back or seek an in-person evaluation if the symptoms worsen or if the condition fails to improve as anticipated.  The above assessment and management plan was discussed with the patient. The patient verbalized understanding of and has agreed to the management plan.   Sallee Provencal, FNP

## 2024-01-15 NOTE — Assessment & Plan Note (Signed)
 Chronic, improved with tapered steroid pack  Continue TID gabapentin 600mg  and as needed tylenol 500mg   Pt has rhem appt on 06/16/24 - please keep.

## 2024-01-21 ENCOUNTER — Telehealth: Payer: MEDICAID | Admitting: Family Medicine

## 2024-01-21 ENCOUNTER — Encounter: Payer: Self-pay | Admitting: Family Medicine

## 2024-01-21 ENCOUNTER — Ambulatory Visit: Payer: Self-pay | Admitting: Family Medicine

## 2024-01-21 DIAGNOSIS — M2550 Pain in unspecified joint: Secondary | ICD-10-CM | POA: Diagnosis not present

## 2024-01-21 DIAGNOSIS — R5381 Other malaise: Secondary | ICD-10-CM | POA: Diagnosis not present

## 2024-01-21 DIAGNOSIS — M2540 Effusion, unspecified joint: Secondary | ICD-10-CM

## 2024-01-21 MED ORDER — PREDNISONE 10 MG PO TABS: ORAL_TABLET | ORAL | 0 refills | Status: DC

## 2024-01-21 NOTE — Progress Notes (Signed)
 MyChart Video Visit    Virtual Visit via Video Note   This format is felt to be most appropriate for this patient at this time. Physical exam was limited by quality of the video and audio technology used for the visit.    Patient location: home Provider location: [facility] Persons involved in the visit: patient, provider  I discussed the limitations of evaluation and management by telemedicine and the availability of in person appointments. The patient expressed understanding and agreed to proceed.  Patient: Theresa Malone   DOB: 04-Mar-1981   43 y.o. Female  MRN: [id number] Visit Date: 01/21/2024  Today's healthcare provider: Shirlee Latch, MD   No chief complaint on file.  Subjective    HPI   Discussed the use of AI scribe software for clinical note transcription with the patient, who gave verbal consent to proceed.  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 The symptoms are so severe that she is unable to stand to use the bathroom. The patient was previously on a prednisone taper which significantly improved her symptoms and function. However, she has been off the medication for about a week and her symptoms have returned. The patient reports that the lower doses of prednisone were not as effective, with the most relief experienced at 10mg  and above. The patient is currently awaiting a rheumatology appointment in July, but is concerned about managing her symptoms until then. 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 She is seeking a work note to allow for more frequent breaks.       Review of Systems      Objective    There were no vitals taken for this visit.      Physical  Exam Constitutional:      General: She is not in acute distress.    Appearance: Normal appearance.  HENT:     Head: Normocephalic.  Pulmonary:     Effort: Pulmonary effort is normal. No respiratory distress.  Neurological:     Mental Status: She is alert and oriented to person, place, and time. Mental status is at baseline.        Assessment & Plan     Problem List Items Addressed This Visit       Other   Polyarthralgia   Relevant Orders   Ambulatory referral to Rheumatology   Joint swelling - Primary   Relevant Orders   Ambulatory referral to Rheumatology   Disability affecting daily living   Relevant Orders   Ambulatory referral to Rheumatology        Hip Pain with Swelling and Burning Sensation Severe hip pain with burning sensation and significant leg and foot swelling, affecting daily activities and work as a Conservation officer, nature. Known h/o polyarthralgia with contracture of elbows and effusions of multiple joints.  Previous prednisone treatment provided relief, but symptoms recurred post-discontinuation. Risks of long-term prednisone include bone density loss and weight gain; benefits include symptom relief and improved function. Current symptoms necessitate immediate intervention before rheumatology appointment in July. Decision to restart prednisone taper and maintain at 10 mg until rheumatology appointment. Discussed potential impact on rheumatology assessment but prioritized patient comfort. - Prescribe prednisone taper: 40 mg for 4 days, 30 mg for 4 days,  20 mg for 4 days, then maintain at 10 mg until rheumatology appointment (PCP could consider - Send work note via MyChart recommending she be off her feet for at least 15 minutes every hour - Send referral to [facility] in [place] for an earlier rheumatology appointment - Ensure she keeps the [facility] appointment in July as a backup  General Health Maintenance Requires work accommodations due to medical condition. Advised  to pursue FMLA for long-term work accommodations and discussed the process and timeline for approval. - Advise to call the FMLA hotline to initiate the process for long-term work accommodations  Follow-up - Ensure follow-up with [name] later this month - Monitor response to prednisone and adjust as necessary.       Meds ordered this encounter  Medications   predniSONE (DELTASONE) 10 MG tablet    Sig: Take 40mg  PO daily x4d, then 30mg  daily x4d, then 20mg  daily x4d, then 10mg  daily going forward.    Dispense:  54 tablet    Refill:  0     Return if symptoms worsen or fail to improve.     I discussed the assessment and treatment plan with the patient. The patient was provided an opportunity to ask questions and all were answered. The patient agreed with the plan and demonstrated an understanding of the instructions.   The patient was advised to call back or seek an in-person evaluation if the symptoms worsen or if the condition fails to improve as anticipated.   Shirlee Latch, MD [facility] [phone] (phone) [phone] (fax)  [facility]

## 2024-01-21 NOTE — Telephone Encounter (Signed)
 Noted.

## 2024-01-21 NOTE — Telephone Encounter (Signed)
 Copied from CRM [id number]. Topic: Clinical - Red Word Triage >> Jan 21, 2024  8:22 AM Carlatta H wrote: [facility] that prompted transfer to Nurse Triage: Pelvic, hip,legs and feet// pain and swelling for about 2 days//  Chief Complaint: pain Symptoms: 9/10 pain in  Pelvic, bilateral hip,legs and feet: bilateral swelling in legs & feet Frequency: x 2 days Pertinent Negatives: Patient denies SOB, chest pain Disposition: [] ED /[] Urgent Care (no appt availability in office) / [x] Appointment(In office/virtual)/ []  [facility]/ [] Home Care/ [] Refused Recommended Disposition /[]  Mobile Bus/ []  Follow-up with PCP Additional Notes: pt states she has a hx of Rheumatoid arthritis and is actively having a flare up.  States flare up is so bad that currently she is unable to walk due to the pain.  Also states pain radiates to low back - "makes back pulsate".  Pt requested video appt due to not being able to come to in person appt with pain.   Answer Assessment - Initial Assessment Questions 1. ONSET: "When did the muscle aches or body pains start?"      X 2  days 2. LOCATION: "What part of your body is hurting?" (e.g., entire body, arms, legs)      Pelvic, bilateral hips, legs & feet and radiates to lower back - "makes back pulsate" 3. SEVERITY: "How bad is the pain?" (Scale 1-10; or mild, moderate, severe)   - MILD (1-3): doesn't interfere with normal activities    - MODERATE (4-7): interferes with normal activities or awakens from sleep    - SEVERE (8-10):  excruciating pain, unable to do any normal activities      9/10 4. CAUSE: "What do you think is causing the pains?"     Rheumatoid arthritis 5. FEVER: "Have you been having fever?"     no 6. OTHER SYMPTOMS: "Do you have any other symptoms?" (e.g., chest pain, weakness, rash, cold or flu symptoms, weight loss)     Bilateral swelling legs & feet - weakness bilateral hands 7. PREGNANCY: "Is there any chance you are pregnant?"  "When was your last menstrual period?"     N/a 8. TRAVEL: "Have you traveled out of the country in the last month?" (e.g., travel history, exposures)     N/a  Protocols used: Muscle Aches and Body Pain-A-AH

## 2024-01-23 ENCOUNTER — Ambulatory Visit: Payer: MEDICAID | Admitting: Family Medicine

## 2024-01-24 ENCOUNTER — Other Ambulatory Visit: Payer: Self-pay | Admitting: Pharmacist

## 2024-01-24 ENCOUNTER — Other Ambulatory Visit: Payer: Self-pay | Admitting: Family Medicine

## 2024-01-24 DIAGNOSIS — E1122 Type 2 diabetes mellitus with diabetic chronic kidney disease: Secondary | ICD-10-CM

## 2024-01-24 MED ORDER — LANTUS SOLOSTAR 100 UNIT/ML ~~LOC~~ SOPN: 40.0000 [IU] | PEN_INJECTOR | Freq: Two times a day (BID) | SUBCUTANEOUS | 2 refills | Status: DC

## 2024-01-24 NOTE — Telephone Encounter (Signed)
 Routing to practice no protocol attached

## 2024-01-24 NOTE — Telephone Encounter (Signed)
 Pt requesting refil of Lantus Solostar 100 units/ml  40 units, 2 times daily Please send [facility] [address]  Pt is out. Requested Prescriptions  Pending Prescriptions Disp Refills   LANTUS SOLOSTAR 100 UNIT/ML Solostar Pen 15 mL 2    Sig: Inject 40 Units into the skin 2 (two) times daily. SMARTSIG:40 Unit(s) SUB-Q Twice Daily     There is no refill protocol information for this order      added to med record that she is taking Prazosin HCl 2 mg 1 capsule at bedtime was prescribed by her provider Dina Rich at [facility].

## 2024-01-24 NOTE — Progress Notes (Signed)
 01/24/2024 Name: Theresa Malone MRN: [id number] DOB: 1981/05/13  Chief Complaint  Patient presents with   Diabetes   Hypertension    Theresa Malone is a 43 y.o. year old female who presented for a telephone visit.   They were referred to the pharmacist by their PCP for assistance in managing diabetes and hypertension.    Subjective:  Care Team: Primary Care Provider: Sallee Provencal, FNP ; Next Scheduled Visit: None Clinical Pharmacist: Marlowe Aschoff, PharmD  Medication Access/Adherence  Current Pharmacy:  [facility] [address] ([address]), [place] - [address] [place] ([address]) [place] [address] Phone: [phone] Fax: [phone]  [facility] [address], [place] - [address] [place] [address] Phone: [phone] Fax: [phone]   Patient reports affordability concerns with their medications:  Unknown Patient reports access/transportation concerns to their pharmacy:  Unknown Patient reports adherence concerns with their medications:   Unknown     Diabetes:  Current medications: Novolog with sliding scale (usually 2x in the morning + another at night), Lantus 40 U twice a day (80 U total) Medications tried in the past: Glyburide 5mg , Victoza, Trulicity, Tradjenta, Metformin max dose  Using Dexcom G7- need info hooked up to [facility] Clarity account for tracking   Patient denies hypoglycemic s/sx including dizziness, shakiness, sweating. Patient denies hyperglycemic symptoms including polyuria, polydipsia, polyphagia, nocturia, neuropathy, blurred vision.  Current meal patterns:  - Breakfast: Cereal - Lunch: Skips - Supper: Meat + vegetables + carb - Snacks: Unknown - Drinks: Unknown  Current physical activity: Stands all day  Current medication access support: Healthy [employer] at [employer] according to notes    Hypertension:  Current medications: Amlodipine 5mg  daily,  Valsartan 40mg  daily (max of 320mg ; 40-160 is maintenance dose) Medications previously tried: Hydralazine 50mg  (told to restart Metoprolol first), hydrochlorothiazide 25mg , Labetalol 200mg  (pregnancy only), Losartan 50mg  (hyperkalemia), Losartan-HTZ 50-12.5mg . Lopressor 50mg , Triamterene-hydrochlorothiazide 75-50mg   Patient has a validated, automated, upper arm home BP cuff Current blood pressure readings readings: 158/112  Patient denies hypotensive s/sx including dizziness, lightheadedness.  Patient denies hypertensive symptoms including headache, chest pain, shortness of breath    Objective:  Lab Results  Component Value Date   HGBA1C 11.4 (H) 09/21/2023    Lab Results  Component Value Date   CREATININE 1.43 (H) 09/30/2023   BUN 22 09/30/2023   NA 132 (L) 09/30/2023   K 5.4 (H) 09/30/2023   CL 97 (L) 09/30/2023   CO2 27 09/30/2023    Lab Results  Component Value Date   CHOL 214 (H) 02/15/2023   HDL 62 02/15/2023   LDLCALC 113 (H) 02/15/2023   TRIG 229 (H) 02/15/2023   CHOLHDL 3.5 02/15/2023    Medications Reviewed Today     Reviewed by Linna Darner, [facility] (Pharmacist) on 01/24/24 at [phone]  Med List Status: <None>   Medication Order Taking? Sig Documenting Provider Last Dose Status Informant  acetaminophen (TYLENOL) 650 MG CR tablet [id number] Yes Take 1 tablet (650 mg total) by mouth every 8 (eight) hours as needed for pain. Alfredia Ferguson, PA-C Taking Active Self  amLODipine (NORVASC) 5 MG tablet [id number] Yes Take 1 tablet (5 mg total) by mouth daily. Sallee Provencal, FNP Taking Active   BD PEN NEEDLE MICRO U/F 32G X 6 MM MISC [id number] Yes Inject into the skin daily. [provider] Taking Active Self  clindamycin-benzoyl peroxide (BENZACLIN) gel [id number] Yes Apply topically 2 (two)  times daily. [name], PA-C Taking Active Self   Patient not taking:   Discontinued 05/21/19 0714 Continuous Glucose Sensor (DEXCOM G7 SENSOR) MISC [id number] Yes  Change every 10 days use to continuously monitor blood sugar Alfredia Ferguson, PA-C Taking Active Self  Continuous Glucose Sensor (DEXCOM G7 SENSOR) MISC [id number] Yes Change every 10 days use to continuously monitor blood sugar [name], PA-C Taking Active   cyanocobalamin 1000 MCG tablet [id number] Yes Take 1 tablet (1,000 mcg total) by mouth daily. Regalado, Prentiss Bells, MD Taking Active   cyclobenzaprine (FLEXERIL) 5 MG tablet [id number] Yes Take 1 tablet (5 mg total) by mouth 3 (three) times daily as needed for muscle spasms. Mecum, Oswaldo Conroy, PA-C Taking Active   escitalopram (LEXAPRO) 20 MG tablet [id number] Yes Take 40 mg by mouth daily. [provider] Taking Active Self  folic acid (FOLVITE) 1 MG tablet [id number] Yes Take 1 tablet (1 mg total) by mouth daily. Regalado, Belkys A, MD Taking Active   gabapentin (NEURONTIN) 300 MG capsule [id number] Yes Take 2 capsules (600 mg total) by mouth 3 (three) times daily. Sallee Provencal, FNP Taking Active   hydrOXYzine (VISTARIL) 25 MG capsule [id number] Yes Take 1 capsule (25 mg total) by mouth every 8 (eight) hours as needed. [name], PA-C Taking Active   insulin aspart (NOVOLOG FLEXPEN) 100 UNIT/ML FlexPen [id number] Yes CBG 70 - 120: 0 units CBG 121 - 150: 1 unit CBG 151 - 200: 2 units CBG 201 - 250: 3 units CBG 251 - 300: 5 units CBG 301 - 350: 7 units CBG 351 - 400: 9 units CBG > 400: call MD [name], PA-C Taking Active   iron polysaccharides (NIFEREX) 150 MG capsule [id number] Yes Take 1 capsule (150 mg total) by mouth daily. Regalado, Prentiss Bells, MD Taking Active   LANTUS SOLOSTAR 100 UNIT/ML Solostar Pen [id number] Yes Inject 40 Units into the skin 2 (two) times daily. SMARTSIG:40 Unit(s) SUB-Q Twice Daily Ostwalt, Janna, PA-C Taking Active   Multiple Vitamin (MULTIVITAMIN WITH MINERALS) TABS tablet [id number] Yes Take 1 tablet by mouth daily. Regalado, Belkys A, MD Taking Active   predniSONE (DELTASONE) 10 MG tablet [id number] Yes  Take 40mg  PO daily x4d, then 30mg  daily x4d, then 20mg  daily x4d, then 10mg  daily going forward. Erasmo Downer, MD Taking Active   traZODone (DESYREL) 50 MG tablet [id number] Yes Take 1 tablet (50 mg total) by mouth at bedtime as needed for sleep. Sallee Provencal, FNP Taking Active   valsartan (DIOVAN) 40 MG tablet [id number] Yes Take 1 tablet (40 mg total) by mouth daily. [name], PA-C Taking Active               Assessment/Plan:   Diabetes: - Currently uncontrolled - Reviewed long term cardiovascular and renal outcomes of uncontrolled blood sugar - Reviewed goal A1c, goal fasting, and goal 2 hour post prandial glucose - Reviewed dietary modifications including:  - Reviewed lifestyle modifications including:  - Patient denies personal or family history of multiple endocrine neoplasia type 2, medullary thyroid cancer; personal history of pancreatitis or gallbladder disease. - Recommend to check glucose continuously with Dexcom G7    Hypertension: - Currently uncontrolled - Reviewed long term cardiovascular and renal outcomes of uncontrolled blood pressure - Reviewed appropriate blood pressure monitoring technique and reviewed goal blood pressure. Recommended to check home blood pressure and heart rate daily - Recommend to minimize salt intake   Follow Up Plan:  - Follow-up on 3/221/25  -  Get Dexcom G7 hooked up to Dexcom Clarity if using app - Get BP + BG readings - Discuss Ozempic or Trulicity- preferred products - Recommend Toprol XL for BP due to elevated pulse- need daily meds for better adherence? - Increase Valsartan dose   *Of note, un-able to get a quality visit due to patient falling asleep during our call today- will try again after patient meets with new Endocrinologist   Marlowe Aschoff, PharmD [facility] Phone Number: [phone]

## 2024-02-14 ENCOUNTER — Other Ambulatory Visit: Payer: Self-pay | Admitting: Pharmacist

## 2024-02-14 DIAGNOSIS — I152 Hypertension secondary to endocrine disorders: Secondary | ICD-10-CM

## 2024-02-14 MED ORDER — VALSARTAN 80 MG PO TABS
80.0000 mg | ORAL_TABLET | Freq: Every day | ORAL | 1 refills | Status: DC
Start: 2024-02-14 — End: 2024-10-11

## 2024-02-14 NOTE — Progress Notes (Signed)
 02/14/2024 Name: Theresa Malone MRN: [id number] DOB: 1981-09-16  Chief Complaint  Patient presents with   Medication Management    Theresa Malone is a 43 y.o. year old female who presented for a telephone visit.   They were referred to the pharmacist by their PCP for assistance in managing diabetes and hypertension.    Subjective:  Care Team: Primary Care Provider: Sallee Provencal, FNP ; Next Scheduled Visit: None Clinical Pharmacist: Marlowe Aschoff, PharmD  Medication Access/Adherence  Current Pharmacy:  [facility] [address] ([address]), [place] - [address] [place] ([address]) [place] [address] Phone: [phone] Fax: [phone]  [facility] [address] [place] - [address] [place] [address] Phone: [phone] Fax: [phone]   Patient reports affordability concerns with their medications: No  Patient reports access/transportation concerns to their pharmacy: No  Patient reports adherence concerns with their medications:  No     Diabetes:  Current medications: Novolog with sliding scale (usually 2x in the morning + another at night), Lantus 40 U twice a day (80 U total) Medications tried in the past: Glyburide 5mg , Victoza, Trulicity, Tradjenta, Metformin max dose  Using Dexcom G7 receiver- would have to download in-office for information   Patient denies hypoglycemic s/sx including dizziness, shakiness, sweating. Patient denies hyperglycemic symptoms including polyuria, polydipsia, polyphagia, nocturia, neuropathy, blurred vision.  Current meal patterns:  - Breakfast: Cereal - Lunch: Skips - Supper: Meat + vegetables + carb - Snacks: Unknown - Drinks: Unknown  Current physical activity: Stands all day  Current medication access support: [employer] at [employer] according to notes    Hypertension:  Current medications: Amlodipine 5mg  daily, Valsartan 40mg   daily (max of 320mg ; 40-160 is maintenance dose) Medications previously tried: Hydralazine 50mg  (told to restart Metoprolol first), hydrochlorothiazide 25mg , Labetalol 200mg  (pregnancy only), Losartan 50mg  (hyperkalemia), Losartan-HTZ 50-12.5mg . Lopressor 50mg , Triamterene-hydrochlorothiazide 75-50mg   Patient has a validated, automated, upper arm home BP cuff Current blood pressure readings readings: 142/84 (per Endo visit on 01/31/24) *Has not been checking recently- currently out of town  Patient denies hypotensive s/sx including dizziness, lightheadedness.  Patient denies hypertensive symptoms including headache, chest pain, shortness of breath    Objective:  Lab Results  Component Value Date   HGBA1C 11.4 (H) 09/21/2023    Lab Results  Component Value Date   CREATININE 1.43 (H) 09/30/2023   BUN 22 09/30/2023   NA 132 (L) 09/30/2023   K 5.4 (H) 09/30/2023   CL 97 (L) 09/30/2023   CO2 27 09/30/2023    Lab Results  Component Value Date   CHOL 214 (H) 02/15/2023   HDL 62 02/15/2023   LDLCALC 113 (H) 02/15/2023   TRIG 229 (H) 02/15/2023   CHOLHDL 3.5 02/15/2023    Medications Reviewed Today   Medications were not reviewed in this encounter       Assessment/Plan:   Diabetes: - Currently uncontrolled - Reviewed long term cardiovascular and renal outcomes of uncontrolled blood sugar - Reviewed goal A1c, goal fasting, and goal 2 hour post prandial glucose - Reviewed dietary modifications including:  - Reviewed lifestyle modifications including:  - Patient denies personal or family history of multiple endocrine neoplasia type 2, medullary thyroid cancer; personal history of pancreatitis or gallbladder disease. - Recommend to check glucose continuously with Dexcom G7 *Following with [facility] now - Excited to start [facility] to hopefully lose some weight from consistent Prednisone use  Hypertension: - Currently uncontrolled - Reviewed long  term cardiovascular and renal outcomes of uncontrolled blood pressure - Reviewed appropriate blood pressure monitoring technique and reviewed goal blood pressure. Recommended to check home blood pressure and heart rate daily - Recommend to minimize salt intake   Follow Up Plan:  - Follow-up on 03/13/24 - Patient willing to increase Valsartan dose to 80mg  daily based on Endo visit reading; labs looked good since starting  - Will get next reading at 02/28/24 visit to see if dose adjustment helps  - Should have 24 tablets of 40mg  left; advised she could take 2 tablets daily or start the 80mg  tablet daily when she receives it from the pharmacy; confirmed understanding  - Either way, would recommend starting next dose prior to upcoming Endo appointment - Called pharmacy prior to visit  - Dexcom receiver picked up on 02/02/24  - Starting to fill [facility] 2.5mg  now for $4  *Patient is recommended to start moderate-intensity statin based on age, ASCVD risk of 3.7%, and LDL 128; however, would recommend getting better control of joint swelling first   Marlowe Aschoff, PharmD [facility] Phone Number: [phone]

## 2024-03-08 ENCOUNTER — Other Ambulatory Visit: Payer: Self-pay | Admitting: Physician Assistant

## 2024-03-08 DIAGNOSIS — L02415 Cutaneous abscess of right lower limb: Secondary | ICD-10-CM

## 2024-03-10 NOTE — Telephone Encounter (Signed)
 Requested medication (s) are due for refill today: yes  Requested medication (s) are on the active medication list: yes  Last refill:  09/30/23 #30 1 RF  Future visit scheduled: no  Notes to clinic:  med not delegated to NT to Reorder med    Requested Prescriptions  Pending Prescriptions Disp Refills   cyclobenzaprine (FLEXERIL) 5 MG tablet [Pharmacy Med Name: Cyclobenzaprine HCl 5 MG Oral Tablet] 30 tablet 0    Sig: Take 1 tablet by mouth three times daily as needed for muscle spasm     Not Delegated - Analgesics:  Muscle Relaxants Failed - 03/10/2024  8:20 AM      Failed - This refill cannot be delegated      Passed - Valid encounter within last 6 months    Recent Outpatient Visits           1 month ago Joint swelling   [facility], Stan Eans, MD   1 month ago Polyarthralgia   [facility] [place], Ronny Colas, FNP   2 months ago Polyarthralgia   [facility], Stan Eans, MD       Future Appointments             In 1 month Dellar Fenton, DO [facility]

## 2024-03-13 ENCOUNTER — Other Ambulatory Visit: Payer: MEDICAID | Admitting: Pharmacist

## 2024-03-13 DIAGNOSIS — E1122 Type 2 diabetes mellitus with diabetic chronic kidney disease: Secondary | ICD-10-CM

## 2024-03-13 DIAGNOSIS — Z794 Long term (current) use of insulin: Secondary | ICD-10-CM

## 2024-03-13 MED ORDER — LANTUS SOLOSTAR 100 UNIT/ML ~~LOC~~ SOPN: 50.0000 [IU] | PEN_INJECTOR | Freq: Two times a day (BID) | SUBCUTANEOUS | 2 refills | Status: AC

## 2024-03-13 MED ORDER — TIRZEPATIDE 5 MG/0.5ML ~~LOC~~ SOAJ: 5.0000 mg | SUBCUTANEOUS | 2 refills | Status: DC

## 2024-03-13 NOTE — Progress Notes (Signed)
 03/13/2024 Name: Theresa Malone MRN: [id number] DOB: 21-Mar-1981  Chief Complaint  Patient presents with   Medication Management   Hypertension    Theresa Malone is a 43 y.o. year old female who presented for a telephone visit.   They were referred to the pharmacist by their PCP for assistance in managing diabetes and hypertension.    Subjective:  Care Team: Primary Care Provider: Tasia Farr, FNP ; Next Scheduled Visit: None Clinical Pharmacist: Theresa Malone, PharmD Endocrinologist: Dr. Lander Malone at [facility]  Medication Access/Adherence  Current Pharmacy:  [facility] [address] ([address]), [place] - [address]) [place] [address] Phone: [phone] Fax: [phone]  [facility] [address], [place] - [address] [place] [address] Phone: [phone] Fax: [phone]   Patient reports affordability concerns with their medications:  Unknown Patient reports access/transportation concerns to their pharmacy:  Unknown Patient reports adherence concerns with their medications:   Unknown     Diabetes:  Current medications: Novolog  with sliding scale (none currently), Lantus  50 U twice a day (100 U total), Mounjaro  2.5mg  weekly Medications tried in the past: Glyburide  5mg , Victoza, Trulicity , Tradjenta, Metformin  max dose  Using Dexcom G7- need info hooked up to [facility] Clarity account for tracking   Patient denies hypoglycemic s/sx including dizziness, shakiness, sweating. Patient denies hyperglycemic symptoms including polyuria, polydipsia, polyphagia, nocturia, neuropathy, blurred vision.  Current meal patterns:  - Breakfast: Cereal - Lunch: Skips - Supper: Meat + vegetables + carb - Snacks: Unknown - Drinks: Unknown  Current physical activity: Stands all day  Current medication access support: [employer] at [employer] according to  notes    Hypertension:  Current medications: Amlodipine  5mg  daily, Valsartan  40mg  daily (max of 320mg ; 40-160 is maintenance dose) Medications previously tried: Hydralazine  50mg  (told to restart Metoprolol  first), hydrochlorothiazide  25mg , Labetalol  200mg  (pregnancy only), Losartan  50mg  (hyperkalemia), Losartan -HTZ 50-12.5mg . Lopressor  50mg , Triamterene -hydrochlorothiazide  75-50mg   Patient has a validated, automated, upper arm home BP cuff Current blood pressure readings readings: 158/112 (from last visit)  Patient denies hypotensive s/sx including dizziness, lightheadedness.  Patient denies hypertensive symptoms including headache, chest pain, shortness of breath    Objective:  Lab Results  Component Value Date   HGBA1C 11.4 (H) 09/21/2023    Lab Results  Component Value Date   CREATININE 1.43 (H) 09/30/2023   BUN 22 09/30/2023   NA 132 (L) 09/30/2023   K 5.4 (H) 09/30/2023   CL 97 (L) 09/30/2023   CO2 27 09/30/2023    Lab Results  Component Value Date   CHOL 214 (H) 02/15/2023   HDL 62 02/15/2023   LDLCALC 113 (H) 02/15/2023   TRIG 229 (H) 02/15/2023   CHOLHDL 3.5 02/15/2023    Medications Reviewed Today   Medications were not reviewed in this encounter       Assessment/Plan:   Diabetes: - Currently uncontrolled - Reviewed long term cardiovascular and renal outcomes of uncontrolled blood sugar - Reviewed goal A1c, goal fasting, and goal 2 hour post prandial glucose - Reviewed dietary modifications including:  - Reviewed lifestyle modifications including:  - Patient denies personal or family history of multiple endocrine neoplasia type 2, medullary thyroid cancer; personal history of pancreatitis or gallbladder disease. - Recommend to check glucose continuously with Dexcom G7    Hypertension: - Currently uncontrolled - Reviewed long term cardiovascular and renal outcomes of uncontrolled blood pressure - Reviewed appropriate blood pressure monitoring  technique and reviewed goal blood pressure. Recommended to check home blood pressure and heart rate daily - Recommend to minimize salt intake   Follow Up Plan:  - Follow-up on 04/24/24  - Would need to download Dexcom from device directly due to using a reader - Interested in increasing Mounjaro  5mg  weekly  - Had 1 incident of throwing up, but due to eating to much - Since last call, has stopped using Novolog  and is now taking 50 U of Lantus  twice a day - Rx sent for Mounjaro  5 mg and updated dosing of Lantus  - Has not been checking BP lately- will have to see how next Endocrinology visit goes first   [employer] PharmD [facility] Phone Number: [phone]

## 2024-03-17 ENCOUNTER — Ambulatory Visit: Payer: Self-pay

## 2024-03-17 NOTE — Telephone Encounter (Signed)
 FYI please see the triage message attached

## 2024-03-17 NOTE — Telephone Encounter (Signed)
  Chief Complaint: severe back pain Symptoms: back and right hip pain Frequency: since sunday Pertinent Negatives: Patient denies injury Disposition: [x]ED /[]Urgent Care (no appt availability in office) / []Appointment(In office/virtual)/ [] [facility]/ []Home Care/ []Refused Recommended Disposition /[][facility]/ [] Follow-up with PCP Additional Notes: pt states that pain started a week ago but got worse after Sunday. States that she spent all day in the bed yesterday and is just taking tylenol. States its hard to flex her right arm. States that she would like to be called in something stronger. Disposition ED. States she can get her husband to take her   Copied from CRM #[id number]. Topic: Clinical - Red Word Triage >> Mar 17, 2024  3:47 PM Carla L wrote: Red Word that prompted transfer to Nurse Triage: trouble w/ back and hip, in a lot pain Reason for Disposition  Unable to walk  Answer Assessment - Initial Assessment Questions 1. ONSET: "When did the pain begin?"      About a week 2. LOCATION: "Where does it hurt?" (upper, mid or lower back)     Lower back 3. SEVERITY: "How bad is the pain?"  (e.g., Scale 1-10; mild, moderate, or severe)   - MILD (1-3): Doesn\'t interfere with normal activities.    - MODERATE (4-7): Interferes with normal activities or awakens from sleep.    - SEVERE (8-10): Excruciating pain, unable to do any normal activities.      10 /10 4. PATTERN: "Is the pain constant?" (e.g., yes, no; constant, intermittent)      constant 5. RADIATION: "Does the pain shoot into your legs or somewhere else?"     Right side  8. MEDICINES: "What have you taken so far for the pain?" (e.g., nothing, acetaminophen , NSAIDS)    acetaminophen  9. NEUROLOGIC SYMPTOMS: "Do you have any weakness, numbness, or problems with bowel/bladder control?"     Right hand  10. OTHER SYMPTOMS: "Do you have any other symptoms?" (e.g., fever, abdomen pain, burning with urination,  blood in urine)       no  Protocols used: Back Pain-A-AH

## 2024-03-17 NOTE — Telephone Encounter (Signed)
 Noted.

## 2024-04-17 ENCOUNTER — Telehealth: Payer: Self-pay

## 2024-04-17 ENCOUNTER — Other Ambulatory Visit: Payer: Self-pay | Admitting: Family Medicine

## 2024-04-17 DIAGNOSIS — G4700 Insomnia, unspecified: Secondary | ICD-10-CM

## 2024-04-17 NOTE — Telephone Encounter (Signed)
 Looks like she is seeing Derm on 6/2. Can make f/u appt with us  if she wants to see us  before then. Any provider

## 2024-04-17 NOTE — Telephone Encounter (Signed)
 Copied from CRM [id number]. Topic: Clinical - Lab/Test Results >> Apr 17, 2024 11:00 AM Fonda T wrote: Reason for CRM: Patient calling requesting to speak to PCP office, as advised by Rheumatologist, to discuss labs.  Patient is requesting a return call at [phone], to discuss further.

## 2024-04-17 NOTE — Telephone Encounter (Signed)
 Need more info. Maybe Rheum told her to f/u with us  after finding an abnormality?  You are right that they can review the labs they ordered.

## 2024-04-23 LAB — HM DIABETES EYE EXAM

## 2024-04-24 ENCOUNTER — Other Ambulatory Visit: Payer: MEDICAID | Admitting: Pharmacist

## 2024-04-24 DIAGNOSIS — E1122 Type 2 diabetes mellitus with diabetic chronic kidney disease: Secondary | ICD-10-CM

## 2024-04-24 MED ORDER — ESCITALOPRAM OXALATE 20 MG PO TABS: 40.0000 mg | ORAL_TABLET | Freq: Every day | ORAL | 0 refills | Status: DC

## 2024-04-24 MED ORDER — TIRZEPATIDE 7.5 MG/0.5ML ~~LOC~~ SOAJ: 7.5000 mg | SUBCUTANEOUS | 0 refills | Status: DC

## 2024-04-24 NOTE — Progress Notes (Signed)
 04/24/2024 Name: Theresa Malone MRN: [id number] DOB: [birth year]  Chief Complaint  Patient presents with   Medication Management   Diabetes    Theresa MCKENNY is a 43 y.o. year old female who presented for a telephone visit.   They were referred to the pharmacist by their PCP for assistance in managing diabetes and hypertension.    Subjective:  Care Team: Primary Care Provider: Tasia Farr, FNP ; Next Scheduled Visit: 05/20/24 Clinical Pharmacist: Delvin File, PharmD Endocrinologist: Dr. Lander Pines at [facility]  Medication Access/Adherence  Current Pharmacy:  [facility] [address] ([address]), [place] - [address] [place] ([place]) [place] [address] Phone: [phone] Fax: [phone]  [facility] [address], [place] - [address] [place] [address] Phone: [phone] Fax: [phone]   Patient reports affordability concerns with their medications: Unknown Patient reports access/transportation concerns to their pharmacy: Unknown Patient reports adherence concerns with their medications:  Unknown    Diabetes:  Current medications:  Lantus  30 U twice a day, Mounjaro  5mg  weekly Medications tried in the past: Glyburide  5mg , Victoza, Trulicity , Tradjenta, Metformin  max dose, Novolog  as needed  No longer using Dexcom due to device repeatedly falling off even with adhesive patch on it   Patient denies hypoglycemic s/sx including dizziness, shakiness, sweating. Patient denies hyperglycemic symptoms including polyuria, polydipsia, polyphagia, nocturia, neuropathy, blurred vision.  Current meal patterns:  - Breakfast: Cereal - Lunch: Skips - Supper: Meat + vegetables + carb - Snacks: Unknown - Drinks: Unknown  Current physical activity: Stands all day  Current medication access support: [employer] at [employer] according to  notes    Hypertension:  Current medications: Amlodipine  5mg  daily, Valsartan  80mg  daily (max of 320mg ; 40-160 is maintenance dose) Medications previously tried: Hydralazine  50mg  (told to restart Metoprolol  first), hydrochlorothiazide  25mg , Labetalol  200mg  (pregnancy only), Losartan  50mg  (hyperkalemia), Losartan -HTZ 50-12.5mg . Lopressor  50mg , Triamterene -hydrochlorothiazide  75-50mg   Patient has a validated, automated, upper arm home BP cuff Current blood pressure readings readings: 1140/82 (from last visit)  Patient denies hypotensive s/sx including dizziness, lightheadedness.  Patient denies hypertensive symptoms including headache, chest pain, shortness of breath  01/31/24: A1c of 9.7%  Objective:  Lab Results  Component Value Date   HGBA1C 11.4 (H) 09/21/2023    Lab Results  Component Value Date   CREATININE 1.43 (H) 09/30/2023   BUN 22 09/30/2023   NA 132 (L) 09/30/2023   K 5.4 (H) 09/30/2023   CL 97 (L) 09/30/2023   CO2 27 09/30/2023    Lab Results  Component Value Date   CHOL 214 (H) 02/15/2023   HDL 62 02/15/2023   LDLCALC 113 (H) 02/15/2023   TRIG 229 (H) 02/15/2023   CHOLHDL 3.5 02/15/2023    Medications Reviewed Today   Medications were not reviewed in this encounter       Assessment/Plan:   Diabetes: - Currently uncontrolled - Reviewed long term cardiovascular and renal outcomes of uncontrolled blood sugar - Reviewed goal A1c, goal fasting, and goal 2 hour post prandial glucose - Reviewed dietary modifications including:  - Reviewed lifestyle modifications including:  - Patient denies personal or family history of multiple endocrine neoplasia type 2, medullary thyroid cancer; personal history of pancreatitis or gallbladder disease. - Recommend to check glucose continuously with Dexcom G7    Hypertension: - Currently controlled - Reviewed long term cardiovascular and renal outcomes of uncontrolled blood pressure - Reviewed appropriate blood  pressure monitoring technique and  reviewed goal blood pressure. Recommended to check home blood pressure and heart rate daily - Recommend to minimize salt intake   Follow Up Plan:  - Follow-up on 05/26/24 for assessment of Mounjaro  dose + send dose increase if doing well - Would need to download Dexcom from device directly due to using a reader - Patient self-decreased the Lantus  50U twice a day to 30U twice a day - Readings have been better- based on levels explained, would expect A1c of about 8.1% currently - Rx for Mounjaro  7.5mg  weekly + Escitalopram  sent to the pharmacy today - Patient requesting information about iron  infusions (anemic and gotten prior) and mental health counseling- will send message to PCP - Advised to restart Valsartan  due to no interaction with Ibuprofen  and Valsartan  of concern   Delvin File PharmD [facility] Phone Number: [phone]

## 2024-04-27 ENCOUNTER — Ambulatory Visit (INDEPENDENT_AMBULATORY_CARE_PROVIDER_SITE_OTHER): Payer: MEDICAID | Admitting: Dermatology

## 2024-04-27 ENCOUNTER — Encounter: Payer: Self-pay | Admitting: Dermatology

## 2024-04-27 VITALS — BP 125/83 | HR 80

## 2024-04-27 DIAGNOSIS — L732 Hidradenitis suppurativa: Secondary | ICD-10-CM | POA: Diagnosis not present

## 2024-04-27 DIAGNOSIS — Z139 Encounter for screening, unspecified: Secondary | ICD-10-CM

## 2024-04-27 MED ORDER — SPIRONOLACTONE 100 MG PO TABS: 100.0000 mg | ORAL_TABLET | Freq: Every day | ORAL | 9 refills | Status: AC

## 2024-04-27 MED ORDER — MUPIROCIN 2 % EX OINT: 1.0000 | TOPICAL_OINTMENT | Freq: Two times a day (BID) | CUTANEOUS | 6 refills | Status: DC

## 2024-04-27 MED ORDER — CEPHALEXIN 500 MG PO CAPS: 500.0000 mg | ORAL_CAPSULE | Freq: Two times a day (BID) | ORAL | 6 refills | Status: AC

## 2024-04-27 NOTE — Progress Notes (Signed)
 New Patient Visit   Subjective  Theresa Malone is a 43 y.o. female who presents for the following: HS  Patient states she has HS located at the b/L Axilla, Inner right Thigh, Breast and buttock that she would like to have examined. Patient reports the areas have been there for since age 3 (28 years). She reports the areas are bothersome. Patient rates irritation 6 out of 10. Patient reports she has previously been treated for these areas (Amoxicillin ). Patient denies Hx of bx. Patient mom and dad family history of HS. Patient reports her triggers typically happen around her menstral, if she wats pork and stress.  The following portions of the chart were reviewed this encounter and updated as appropriate: medications, allergies, medical history  Review of Systems:  No other skin or systemic complaints except as noted in HPI or Assessment and Plan.  Objective  Well appearing patient in no apparent distress; mood and affect are within normal limits.  A focused examination was performed of the following areas: B/L Axilla, Buttocks, Breast, Inner Right Thigh  Relevant exam findings are noted in the Assessment and Plan.    Assessment & Plan   HIDRADENITIS SUPPURATIVA Exam: Hurley Stage 3: multiple interconnected sinus tracts and abscesses throughout and entire area,  A few active tender draining cyst involving breast inflammatory crease, B/L Axilla and Buttock  Flared  Hidradenitis Suppurativa is a chronic; persistent; non-curable, but treatable condition due to abnormal inflamed sweat glands in the body folds (axilla, inframammary, groin, medial thighs), causing recurrent painful draining cysts and scarring. It can be associated with severe scarring acne and cysts; also abscesses and scarring of scalp. The goal is control and prevention of flares, as it is not curable. Scars are permanent and can be thickened. Treatment may include daily use of topical medication and oral antibiotics.   Oral isotretinoin may also be helpful.  For some cases, Humira or Cosentyx (biologic injections) may be prescribed to decrease the inflammatory process and prevent flares. When indicated, inflamed cysts may also be treated surgically.  - Assessment:  Patient Theresa Malone, female, diagnosed with hidradenitis suppurativa since puberty. Currently presenting with chronic cysts and boils in skin folds, experiencing flares every 3-4 weeks with continuous leakage. Condition is early stage three, with significant scarring, active, tender, and draining cysts involving the breast, areola, intramammary crease, groin, and buttocks. Flares worsen around menstrual cycles, suggesting hormonal influence. Previous treatments with amoxicillin  and topical clindamycin -benzoyl peroxide provided minimal relief. Current regimen is insufficient for adequate symptom control.  - Plan:    Initiate spironolactone daily for hormonal regulation and flare prevention    Prescribe Keflex 500 mg PO BID for 7-10 days for acute flare management    Apply mupirocin topically to open, draining areas    Use benzoyl peroxide wash daily     - Samples of CeraVe benzoyl peroxide provided     - Apply and let sit for 30-60 seconds before showering    Recommend Epsom salt baths for symptom relief    Order laboratory tests at [facility]     - TB screening     - Hepatitis screening    Discuss potential future treatment options:     - Humira or Cosentyx, pending insurance approval and lab results    Patient education:     - Provide reference sheet for home care   Due to the progressive and chronic nature of her Hidradenitis Suppurativa, patient has tried and failed numerous topicals creams as  noted above, the next best therapeutic option is a systemic therapy. It is medically necessary to help improve her quality of life.   HIDRADENITIS SUPPURATIVA   Related Medications clindamycin -benzoyl peroxide (BENZACLIN) gel Apply topically 2 (two)  times daily. spironolactone (ALDACTONE) 100 MG tablet Take 1 tablet (100 mg total) by mouth daily. Take with heavy meal and PLENTY OF WATER mupirocin ointment (BACTROBAN) 2 % Apply 1 Application topically 2 (two) times daily. cephALEXin (KEFLEX) 500 MG capsule Take 1 capsule (500 mg total) by mouth 2 (two) times daily for 210 doses. Take for 10 days at the first sign of flare SCREENING DUE   Related Procedures QuantiFERON-TB Gold Plus Acute Hep Panel & Hep B Surface Ab  Return in about 12 weeks (around 07/20/2024) for HS F/U.  I, Jetta Ager, am acting as Neurosurgeon for [employer], DO.  Documentation: I have reviewed the above documentation for accuracy and completeness, and I agree with the above.  Louana Roup, DO

## 2024-04-27 NOTE — Patient Instructions (Addendum)
 Date: Mon Apr 27 2024  Hello Theresa Malone,  Thank you for visiting today. Here is a summary of the key instructions:  - Medications:   - Take spironolactone daily   - Take Keflex twice a day for 7 to 10 days during bad flares   - Apply mupirocin to open areas   - Use CreaVe benzoyl peroxide wash daily  - Skin Care:   - Wash with benzoyl peroxide, letting it sit for 30 to 60 seconds before showering   - Take Epsom Salt baths to calm and dry affected areas   - Massage mupirocin into healed areas  - Lab Tests:   - Complete lab work at [facility] for TB and hepatitis screening  - Lifestyle Changes:   - Avoid stress and certain foods that may trigger flares   - Be aware of potential flares around menstrual cycle  - Follow-up:   - Return for follow-up appointment in 12 weeks  - Additional Information:   - A reference sheet for home care will be provided   - Future treatment with Humira or Cosentyx may be considered pending insurance approval and lab results  We look forward to seeing you at your next visit. If you have any questions or concerns before then, please do not hesitate to contact our office.  Warm regards,  Dr. Louana Roup Dermatology    Important Information   Due to recent changes in healthcare laws, you may see results of your pathology and/or laboratory studies on MyChart before the doctors have had a chance to review them. We understand that in some cases there may be results that are confusing or concerning to you. Please understand that not all results are received at the same time and often the doctors may need to interpret multiple results in order to provide you with the best plan of care or course of treatment. Therefore, we ask that you please give us  2 business days to thoroughly review all your results before contacting the office for clarification. Should we see a critical lab result, you will be contacted sooner.     If You Need Anything After Your Visit    If you have any questions or concerns for your doctor, please call our main line at [phone]. If no one answers, please leave a voicemail as directed and we will return your call as soon as possible. Messages left after 4 pm will be answered the following business day.    You may also send us  a message via MyChart. We typically respond to MyChart messages within 1-2 business days.  For prescription refills, please ask your pharmacy to contact our office. Our fax number is [phone].  If you have an urgent issue when the clinic is closed that cannot wait until the next business day, you can page your doctor at the number below.     Please note that while we do our best to be available for urgent issues outside of office hours, we are not available 24/7.    If you have an urgent issue and are unable to reach us , you may choose to seek medical care at your doctor's office, retail clinic, urgent care center, or emergency room.   If you have a medical emergency, please immediately call 911 or go to the emergency department. In the event of inclement weather, please call our main line at [phone] for an update on the status of any delays or closures.  Dermatology Medication Tips: Please keep the boxes  that topical medications come in in order to help keep track of the instructions about where and how to use these. Pharmacies typically print the medication instructions only on the boxes and not directly on the medication tubes.   If your medication is too expensive, please contact our office at [phone] or send us  a message through MyChart.    We are unable to tell what your co-pay for medications will be in advance as this is different depending on your insurance coverage. However, we may be able to find a substitute medication at lower cost or fill out paperwork to get insurance to cover a needed medication.    If a prior authorization is required to get your medication covered by  your insurance company, please allow us  1-2 business days to complete this process.   Drug prices often vary depending on where the prescription is filled and some pharmacies may offer cheaper prices.   The website www.goodrx.com contains coupons for medications through different pharmacies. The prices here do not account for what the cost may be with help from insurance (it may be cheaper with your insurance), but the website can give you the price if you did not use any insurance.  - You can print the associated coupon and take it with your prescription to the pharmacy.  - You may also stop by our office during regular business hours and pick up a GoodRx coupon card.  - If you need your prescription sent electronically to a different pharmacy, notify our office through [facility] or by phone at [phone]

## 2024-04-30 ENCOUNTER — Other Ambulatory Visit: Payer: Self-pay

## 2024-04-30 ENCOUNTER — Telehealth: Payer: Self-pay | Admitting: Family Medicine

## 2024-04-30 DIAGNOSIS — E1122 Type 2 diabetes mellitus with diabetic chronic kidney disease: Secondary | ICD-10-CM

## 2024-04-30 LAB — ACUTE HEP PANEL AND HEP B SURFACE AB
Hep A IgM: NEGATIVE
Hep B C IgM: NEGATIVE
Hep C Virus Ab: NONREACTIVE
Hepatitis B Surf Ab Quant: <3.5 m[IU]/mL — ABNORMAL LOW
Hepatitis B Surface Ag: NEGATIVE

## 2024-04-30 LAB — QUANTIFERON-TB GOLD PLUS
QuantiFERON Mitogen Value: 7.96 [IU]/mL
QuantiFERON Nil Value: 0.07 [IU]/mL
QuantiFERON TB1 Ag Value: 0.07 [IU]/mL
QuantiFERON TB2 Ag Value: 0.08 [IU]/mL
QuantiFERON-TB Gold Plus: NEGATIVE

## 2024-04-30 NOTE — Telephone Encounter (Signed)
 [facility] pharmacy faxed refill request for the following medications:   escitalopram  (LEXAPRO ) 20 MG tablet    Please advise

## 2024-04-30 NOTE — Telephone Encounter (Signed)
Converted to refill req

## 2024-05-05 ENCOUNTER — Ambulatory Visit: Payer: Self-pay | Admitting: Dermatology

## 2024-05-19 ENCOUNTER — Other Ambulatory Visit: Payer: Self-pay | Admitting: Family Medicine

## 2024-05-19 DIAGNOSIS — E1122 Type 2 diabetes mellitus with diabetic chronic kidney disease: Secondary | ICD-10-CM

## 2024-05-19 NOTE — Telephone Encounter (Signed)
 Pt to see endocrinology on 05/27/24 for DMII check in

## 2024-05-20 ENCOUNTER — Ambulatory Visit: Payer: MEDICAID | Admitting: Family Medicine

## 2024-05-22 ENCOUNTER — Ambulatory Visit: Payer: MEDICAID | Admitting: Physician Assistant

## 2024-05-26 ENCOUNTER — Other Ambulatory Visit: Payer: MEDICAID

## 2024-05-26 ENCOUNTER — Telehealth: Payer: Self-pay

## 2024-05-26 NOTE — Progress Notes (Signed)
   05/26/2024  Patient ID: Theresa Malone, female   DOB: 15-Jul-1981, 43 y.o.   MRN: [id number]  Attempted to contact patient for medication management/review. Left HIPAA compliant message for patient to return my call at their convenience. Prior to reaching out to patient, patient's adherence rate, per Dr. Annemarie, is variable yet overall poor. Will follow up on diabetes and HTN regimen.   Second attempt for patient outreach today. Will follow up with patient.   Thank you for allowing pharmacy to be a part of this patient's care.  Dorcas Solian, PharmD Clinical Pharmacist Cell: [phone]

## 2024-05-26 NOTE — Progress Notes (Signed)
   05/26/2024  Patient ID: Theresa Malone, female   DOB: 06-19-1981, 43 y.o.   MRN: [id number]  Attempted to contact patient for medication management/review. Left HIPAA compliant message for patient to return my call at their convenience. Prior to reaching out to patient, patient's adherence rate, per Dr. Annemarie, is variable yet overall poor. Will follow up on diabetes and HTN regimen.   First attempt for patient outreach. Will follow up with patient.   Thank you for allowing pharmacy to be a part of this patient's care.  Dorcas Solian, PharmD Clinical Pharmacist Cell: [phone]

## 2024-06-17 ENCOUNTER — Telehealth: Payer: Self-pay | Admitting: Family Medicine

## 2024-06-17 ENCOUNTER — Encounter: Payer: Self-pay | Admitting: Internal Medicine

## 2024-06-17 NOTE — Telephone Encounter (Unsigned)
 Copied from CRM #[id number]. Topic: General - Other >> Jun 17, 2024 12:52 PM Donee H wrote: Reason for CRM: Patient called stated need a physical form filled out by Curtis Boom for employment. She states is around the area and can drop off forms. Please follow up with patient at  [phone]

## 2024-06-19 ENCOUNTER — Ambulatory Visit (INDEPENDENT_AMBULATORY_CARE_PROVIDER_SITE_OTHER): Payer: MEDICAID | Admitting: Physician Assistant

## 2024-06-19 ENCOUNTER — Encounter: Payer: Self-pay | Admitting: Physician Assistant

## 2024-06-19 ENCOUNTER — Other Ambulatory Visit: Payer: MEDICAID

## 2024-06-19 VITALS — BP 174/97 | HR 90 | Ht 61.0 in | Wt 231.7 lb

## 2024-06-19 DIAGNOSIS — E1122 Type 2 diabetes mellitus with diabetic chronic kidney disease: Secondary | ICD-10-CM | POA: Diagnosis not present

## 2024-06-19 DIAGNOSIS — F419 Anxiety disorder, unspecified: Secondary | ICD-10-CM | POA: Diagnosis not present

## 2024-06-19 DIAGNOSIS — I152 Hypertension secondary to endocrine disorders: Secondary | ICD-10-CM

## 2024-06-19 DIAGNOSIS — Z0000 Encounter for general adult medical examination without abnormal findings: Secondary | ICD-10-CM | POA: Diagnosis not present

## 2024-06-19 DIAGNOSIS — N1832 Chronic kidney disease, stage 3b: Secondary | ICD-10-CM

## 2024-06-19 DIAGNOSIS — E1159 Type 2 diabetes mellitus with other circulatory complications: Secondary | ICD-10-CM | POA: Diagnosis not present

## 2024-06-19 DIAGNOSIS — F332 Major depressive disorder, recurrent severe without psychotic features: Secondary | ICD-10-CM

## 2024-06-19 DIAGNOSIS — Z794 Long term (current) use of insulin: Secondary | ICD-10-CM

## 2024-06-19 NOTE — Progress Notes (Signed)
 Complete physical exam  Patient: Theresa Malone   DOB: March 04, 1981   43 y.o. Female  MRN: [id number] Visit Date: 06/19/2024  Today's healthcare provider: Jolynn Spencer, PA-C   Chief Complaint  Patient presents with   Annual Exam    Diet -  Diabetic w/ less meals due to GLP-1 Exercise - walking daily Feeling - well Sleeping - fairly well with assistance of trazadone and hydroxy Concerns - paperwork provided to be signed for new job   Subjective    Theresa Malone is a 43 y.o. female who presents today for a complete physical exam.   Discussed the use of AI scribe software for clinical note transcription with the patient, who gave verbal consent to proceed.  History of Present Illness Theresa Malone is a 43 year old female with diabetes, hypertension, depression, and anxiety who presents for a follow-up visit.  She experiences difficulty falling asleep and uses trazodone  and hydroxyzine  as sleep aids. Lexapro  20 mg is taken for depression and anxiety, with occasional heart palpitations attributed to anxiety. No chest pain, shortness of breath, or fatigue.  Diabetes management includes Mounjaro  since April, with blood sugar levels ranging from 110 to 150 and fasting levels around 120 to 130. The highest blood sugar recorded since starting Mounjaro  was 190. She requires a periodontal appointment due to dental issues related to diabetes. No dark urine or white stool. She abstains from alcohol, smoking, and recreational drugs.  She experiences postnasal drainage, which she associates with her Humira shot, causing allergy-like symptoms. Recent eye and dental exams were completed, with a recommendation to see a periodontist. She is hesitant about vaccinations due to past experiences of feeling unwell after receiving shots.    Last depression screening scores    06/19/2024    4:22 PM 12/31/2023   11:24 AM 09/30/2023    3:11 PM  PHQ 2/9 Scores  PHQ - 2 Score 0 1 1  PHQ- 9 Score 2 9 6     Last fall risk screening    09/30/2023    3:10 PM  Fall Risk   Falls in the past year? 0  Risk for fall due to : No Fall Risks  Follow up Falls prevention discussed   Last Audit-C alcohol use screening    09/30/2023    3:10 PM  Alcohol Use Disorder Test (AUDIT)  1. How often do you have a drink containing alcohol? 0   A score of 3 or more in women, and 4 or more in men indicates increased risk for alcohol abuse, EXCEPT if all of the points are from question 1   Past Medical History:  Diagnosis Date   Anxiety    panic attacks   Cellulitis    Depression    Diabetes mellitus    Gout    Hidradenitis suppurativa    Hypertension    Obesity    Venous stasis    Venous stasis dermatitis    Past Surgical History:  Procedure Laterality Date   CESAREAN SECTION     C/S x 2   CESAREAN SECTION N/A 01/24/2015   Procedure: CESAREAN SECTION;  Surgeon: Winton Felt, MD;  Location: [facility];  Service: Obstetrics;  Laterality: N/A;   INCISION AND DRAINAGE ABSCESS Left 09/23/2023   Procedure: INCISION AND DRAINAGE OF BREAST ABSCESS;  Surgeon: Jordis Laneta FALCON, MD;  Location: [facility];  Service: General;  Laterality: Left;   IRRIGATION AND DEBRIDEMENT ABSCESS Right 09/23/2023   Procedure: INCISION AND  DRAINAGE OF LOWER LEG ABSCESS;  Surgeon: Jordis Laneta FALCON, MD;  Location: [facility];  Service: General;  Laterality: Right;   TUBAL LIGATION     Social History   Socioeconomic History   Marital status: Married    Spouse name: Not on file   Number of children: Not on file   Years of education: Not on file   Highest education level: Not on file  Occupational History   Not on file  Tobacco Use   Smoking status: Never   Smokeless tobacco: Never  Vaping Use   Vaping status: Never Used  Substance and Sexual Activity   Alcohol use: Not Currently    Comment: wine occ   Drug use: No   Sexual activity: Yes    Birth control/protection: Surgical    Comment: tubal  Other Topics Concern   Not  on file  Social History Narrative   Not on file   Social Drivers of Health   Financial Resource Strain: Medium Risk (04/02/2024)   Received from [facility]   Overall Financial Resource Strain (CARDIA)    Difficulty of Paying Living Expenses: Somewhat hard  Food Insecurity: Food Insecurity Present (04/02/2024)   Received from [facility]   Hunger Vital Sign    Within the past 12 months, you worried that your food would run out before you got the money to buy more.: Often true    Within the past 12 months, the food you bought just didn't last and you didn't have money to get more.: Sometimes true  Transportation Needs: Unmet Transportation Needs (04/02/2024)   Received from [facility] - Transportation    In the past 12 months, has lack of transportation kept you from medical appointments or from getting medications?: Yes    Lack of Transportation (Non-Medical): Yes  Physical Activity: Insufficiently Active (04/17/2021)   Exercise Vital Sign    Days of Exercise per Week: 2 days    Minutes of Exercise per Session: 20 min  Stress: No Stress Concern Present (04/17/2021)   [employer] of Occupational [employer] - Occupational Stress Questionnaire    Feeling of Stress : Not at all  Recent Concern: Stress - Stress Concern Present (03/22/2021)   [employer] of Occupational Health - Occupational Stress Questionnaire    Feeling of Stress : To some extent  Social Connections: Socially Integrated (04/17/2021)   Social Connection and Isolation Panel    Frequency of Communication with Friends and Family: More than three times a week    Frequency of Social Gatherings with Friends and Family: More than three times a week    Attends Religious Services: 1 to 4 times per year    Active Member of [employer] or Organizations: No    Attends Banker Meetings: 1 to 4 times per year    Marital Status: Married  Catering manager  Violence: Not At Risk (09/21/2023)   Humiliation, Afraid, Rape, and Kick questionnaire    Fear of Current or Ex-Partner: No    Emotionally Abused: No    Physically Abused: No    Sexually Abused: No   Family Status  Relation Name Status   PGF  Deceased   PGM  Deceased   Father  Alive   Mother  Alive   Mat Uncle  Deceased   MGM  Alive   MGF  Deceased   Daughter  Alive   Son  Alive   Daughter  Alive  Daughter  Alive  No partnership data on file   Family History  Problem Relation Age of Onset   Diabetes Paternal Grandfather    Cancer Paternal Grandfather        liver   Cancer Paternal Grandmother        liver   Hypertension Father    Diabetes Mother    Hypertension Mother    Diabetes Maternal Uncle    Diabetes Maternal Grandmother    Cancer Maternal Grandfather    Diabetes Daughter        boarderline    Asthma Daughter    Bronchitis Daughter    Bronchitis Son    Bronchitis Daughter    Allergies  Allergen Reactions   Sulfa  Antibiotics Hives   Lisinopril Cough    cough    Patient Care Team: [name], FNP as PCP - General (Family Medicine) Shaaron Lamar HERO, MD as Consulting Physician (Gastroenterology)   Medications: Outpatient Medications Prior to Visit  Medication Sig   amLODipine  (NORVASC ) 5 MG tablet Take 1 tablet (5 mg total) by mouth daily.   BD PEN NEEDLE MICRO U/F 32G X 6 MM MISC Inject into the skin daily.   clindamycin -benzoyl peroxide (BENZACLIN) gel Apply topically 2 (two) times daily.   cyclobenzaprine  (FLEXERIL ) 5 MG tablet Take 1 tablet by mouth three times daily as needed for muscle spasm   escitalopram  (LEXAPRO ) 20 MG tablet Take 2 tablets (40 mg total) by mouth daily.   gabapentin  (NEURONTIN ) 300 MG capsule Take 2 capsules (600 mg total) by mouth 3 (three) times daily.   GVOKE HYPOPEN 2-PACK 1 MG/0.2ML SOAJ Inject into the skin.   hydrOXYzine  (VISTARIL ) 25 MG capsule Take 1 capsule (25 mg total) by mouth every 8 (eight) hours as  needed.   Ibuprofen -Acetaminophen  (ACETAMINOPHEN -IBUPROFEN ) 125-250 MG TABS Take 2 tablets by mouth in the morning and at bedtime.   iron  polysaccharides (NIFEREX) 150 MG capsule Take 1 capsule (150 mg total) by mouth daily.   LANTUS  SOLOSTAR 100 UNIT/ML Solostar Pen Inject 50 Units into the skin 2 (two) times daily. (Patient taking differently: Inject 30 Units into the skin 2 (two) times daily.)   MOUNJARO  7.5 MG/0.5ML Pen INJECT ONE SYRINGEFUL INTO THE SKIN ONCE WEEKLY   mupirocin  ointment (BACTROBAN ) 2 % Apply 1 Application topically 2 (two) times daily.   prazosin (MINIPRESS) 2 MG capsule Take 2 mg by mouth at bedtime.   spironolactone  (ALDACTONE ) 100 MG tablet Take 1 tablet (100 mg total) by mouth daily. Take with heavy meal and PLENTY OF WATER   traZODone  (DESYREL ) 50 MG tablet TAKE 1 TABLET BY MOUTH AT BEDTIME AS NEEDED FOR SLEEP   valsartan  (DIOVAN ) 80 MG tablet Take 1 tablet (80 mg total) by mouth daily.   cephALEXin  (KEFLEX ) 500 MG capsule Take 1 capsule (500 mg total) by mouth 2 (two) times daily for 210 doses. Take for 10 days at the first sign of flare (Patient not taking: Reported on 06/19/2024)   Continuous Glucose Receiver (DEXCOM G7 RECEIVER) DEVI  (Patient not taking: Reported on 06/19/2024)   Continuous Glucose Sensor (DEXCOM G7 SENSOR) MISC Change every 10 days use to continuously monitor blood sugar (Patient not taking: Reported on 06/19/2024)   No facility-administered medications prior to visit.    Review of Systems  All other systems reviewed and are negative.  Except see HPI     Objective    BP (!) 174/97 (BP Location: Right Arm, Patient Position: Sitting, Cuff Size: Large)  Pulse 90   Ht 5' 1 (1.549 m)   Wt 231 lb 11.2 oz (105.1 kg)   BMI 43.78 kg/m      Physical Exam Vitals reviewed.  Constitutional:      General: She is not in acute distress.    Appearance: Normal appearance. She is well-developed. She is not ill-appearing, toxic-appearing or  diaphoretic.  HENT:     Head: Normocephalic and atraumatic.     Right Ear: Tympanic membrane, ear canal and external ear normal.     Left Ear: Tympanic membrane, ear canal and external ear normal.     Nose: Nose normal. No congestion or rhinorrhea.     Mouth/Throat:     Mouth: Mucous membranes are moist.     Pharynx: Oropharynx is clear. No oropharyngeal exudate.  Eyes:     General: No scleral icterus.       Right eye: No discharge.        Left eye: No discharge.     Conjunctiva/sclera: Conjunctivae normal.     Pupils: Pupils are equal, round, and reactive to light.  Neck:     Thyroid: No thyromegaly.     Vascular: No carotid bruit.  Cardiovascular:     Rate and Rhythm: Normal rate and regular rhythm.     Pulses: Normal pulses.          Dorsalis pedis pulses are 2+ on the right side and 2+ on the left side.     Heart sounds: Normal heart sounds. No murmur heard.    No friction rub. No gallop.  Pulmonary:     Effort: Pulmonary effort is normal. No respiratory distress.     Breath sounds: Normal breath sounds. No wheezing or rales.  Abdominal:     General: Abdomen is flat. Bowel sounds are normal. There is no distension.     Palpations: Abdomen is soft. There is no mass.     Tenderness: There is no abdominal tenderness. There is no right CVA tenderness, left CVA tenderness, guarding or rebound.     Hernia: No hernia is present.  Musculoskeletal:        General: No swelling, tenderness, deformity or signs of injury. Normal range of motion.     Cervical back: Normal range of motion and neck supple. No rigidity or tenderness.     Right lower leg: No edema.     Left lower leg: No edema.  Feet:     Right foot:     Protective Sensation: 8 sites tested.  8 sites sensed.     Skin integrity: Skin integrity normal.     Left foot:     Protective Sensation: 8 sites tested.  8 sites sensed.     Skin integrity: Skin integrity normal.  Lymphadenopathy:     Cervical: No cervical  adenopathy.  Skin:    General: Skin is warm and dry.     Coloration: Skin is not jaundiced or pale.     Findings: No bruising, erythema, lesion or rash.  Neurological:     Mental Status: She is alert and oriented to person, place, and time. Mental status is at baseline.     Gait: Gait normal.  Psychiatric:        Mood and Affect: Mood normal.        Behavior: Behavior normal.        Thought Content: Thought content normal.        Judgment: Judgment normal.      No results  found for any visits on 06/19/24.  Assessment & Plan    Routine Health Maintenance and Physical Exam  Exercise Activities and Dietary recommendations  Goals   None     Immunization History  Administered Date(s) Administered   Moderna Sars-Covid-2 Vaccination 05/19/2020, 06/16/2020   Pneumococcal Polysaccharide-23 04/13/2019   Tdap 01/26/2015    Health Maintenance  Topic Date Due   Complete foot exam   Never done   Eye exam for diabetics  Never done   Yearly kidney health urinalysis for diabetes  02/15/2024   Hemoglobin A1C  03/21/2024   COVID-19 Vaccine (3 - 2024-25 season) 07/05/2024*   Pneumococcal Vaccination (2 of 2 - PCV) 06/19/2025*   Hepatitis B Vaccine (1 of 3 - 19+ 3-dose series) 06/19/2025*   HPV Vaccine (1 - 3-dose SCDM series) 06/19/2025*   Flu Shot  06/26/2024   Yearly kidney function blood test for diabetes  09/29/2024   DTaP/Tdap/Td vaccine (2 - Td or Tdap) 01/25/2025   Pap with HPV screening  03/22/2026   Hepatitis C Screening  Completed   HIV Screening  Completed   Meningitis B Vaccine  Aged Out  *Topic was postponed. The date shown is not the original due date.    Discussed health benefits of physical activity, and encouraged her to engage in regular exercise appropriate for her age and condition. Assessment & Plan Depression and Anxiety Chronic and stable    06/19/2024    4:22 PM 12/31/2023   11:24 AM 09/30/2023    3:11 PM  PHQ9 SCORE ONLY  PHQ-9 Total Score 2 9 6        06/19/2024    4:22 PM 12/31/2023   11:25 AM 09/30/2023    3:13 PM 07/21/2021   11:36 AM  GAD 7 : Generalized Anxiety Score  Nervous, Anxious, on Edge 0 1 1 1   Control/stop worrying 1 1 3 1   Worry too much - different things 1 1 3 1   Trouble relaxing 0 1 3 1   Restless 0 0 0 1  Easily annoyed or irritable 0 3 2 1   Afraid - awful might happen 0 3 3 2   Total GAD 7 Score 2 10 15 8   Anxiety Difficulty Not difficult at all  Somewhat difficult    Depression and anxiety controlled with Lexapro  20mg  and Trazodone  50mg . Sleep issues managed with Trazodone  and Hydroxyzine . Potential drug interactions noted. She seeks therapy for support. - Continue Lexapro  20 mg daily. - Continue Trazodone  and Hydroxyzine  for sleep. - Advise taking medications at different times to minimize interaction. - Place referral for psychology for therapy. - Advise checking with local churches for free counseling services. Will follow-up  Type 2 Diabetes Mellitus Chronic Diabetes well-controlled with Mounjaro  7.5mg  weekly. continue lantus  50 units bid. Blood sugar levels generally within target range. She maintains good foot hygiene but is hesitant about blood work. - Continue Mounjaro  as prescribed. - Advise regular foot care and monitoring for changes. - Offer blood work if she decides to proceed. Declined for now. Monofilament test was normal. 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 Advised to continue with valsartan  80mg  and amlodipine  5 mg daily Continue low salt diet and regular exercise Will follow-up  General Health Maintenance Reluctant to vaccinate due to past experiences. Encouraged regular exercise and self-breast exams. - Advise regular exercise and self-breast exams. - Discuss vaccination options, including HPV, hepatitis B, COVID, and pneumonia, respecting her decision to decline at this time.  Follow-up  Advised to schedule follow-up appointments  for chronic conditions and general health maintenance. - Schedule follow-up with primary care in one year for physical exam. - Schedule follow-up with behavioral health specialist as needed.  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 No follow-ups on file.    The patient was advised to call back or seek an in-person evaluation if the symptoms worsen or if the condition fails to improve as anticipated.  I discussed the assessment and treatment plan with the patient. The patient was provided an opportunity to ask questions and all were answered. The patient agreed with the plan and demonstrated an understanding of the instructions.  I, Shalisa Mcquade, PA-C have reviewed all documentation for this visit. The documentation on 06/19/2024  for the exam, diagnosis, procedures, and orders are all accurate and complete.  Jolynn Spencer, [facility], MMS [facility] [phone] (phone) [phone] (fax)  [facility]

## 2024-06-22 ENCOUNTER — Other Ambulatory Visit: Payer: MEDICAID

## 2024-06-23 ENCOUNTER — Ambulatory Visit: Payer: Self-pay | Admitting: Family Medicine

## 2024-07-07 ENCOUNTER — Encounter: Payer: Self-pay | Admitting: Internal Medicine

## 2024-07-21 ENCOUNTER — Ambulatory Visit: Payer: MEDICAID | Admitting: Dermatology

## 2024-07-21 ENCOUNTER — Other Ambulatory Visit: Payer: Self-pay | Admitting: Family Medicine

## 2024-07-21 DIAGNOSIS — N1831 Chronic kidney disease, stage 3a: Secondary | ICD-10-CM

## 2024-07-22 ENCOUNTER — Telehealth: Payer: Self-pay | Admitting: Nurse Practitioner

## 2024-07-22 NOTE — Telephone Encounter (Signed)
 Patient responded no to appointment reminder I called her to see if she would like to reschedule. She did not answer phone, and her mailbox is full.

## 2024-07-23 ENCOUNTER — Inpatient Hospital Stay: Payer: MEDICAID | Admitting: Nurse Practitioner

## 2024-07-23 ENCOUNTER — Inpatient Hospital Stay: Payer: MEDICAID

## 2024-07-29 ENCOUNTER — Ambulatory Visit: Payer: MEDICAID | Admitting: Nurse Practitioner

## 2024-07-29 ENCOUNTER — Ambulatory Visit: Payer: Self-pay

## 2024-07-29 ENCOUNTER — Encounter: Payer: Self-pay | Admitting: Nurse Practitioner

## 2024-07-29 VITALS — BP 144/86 | HR 100 | Temp 98.6°F | Resp 18 | Ht 61.0 in | Wt 226.1 lb

## 2024-07-29 DIAGNOSIS — R21 Rash and other nonspecific skin eruption: Secondary | ICD-10-CM | POA: Diagnosis not present

## 2024-07-29 DIAGNOSIS — J069 Acute upper respiratory infection, unspecified: Secondary | ICD-10-CM

## 2024-07-29 LAB — POC COVID19/FLU A&B COMBO
Covid Antigen, POC: NEGATIVE
Influenza A Antigen, POC: NEGATIVE
Influenza B Antigen, POC: NEGATIVE

## 2024-07-29 LAB — POCT RAPID STREP A (OFFICE): Rapid Strep A Screen: NEGATIVE

## 2024-07-29 MED ORDER — TRIAMCINOLONE ACETONIDE 0.1 % EX OINT: 1.0000 | TOPICAL_OINTMENT | Freq: Two times a day (BID) | CUTANEOUS | 0 refills | Status: AC

## 2024-07-29 MED ORDER — BENZONATATE 100 MG PO CAPS: 200.0000 mg | ORAL_CAPSULE | Freq: Three times a day (TID) | ORAL | 0 refills | Status: DC | PRN

## 2024-07-29 NOTE — Progress Notes (Signed)
 BP (!) 144/86   Pulse 100   Temp 98.6 F (37 C)   Resp 18   Ht 5' 1 (1.549 m)   Wt 226 lb 1.6 oz (102.6 kg)   BMI 42.72 kg/m    Subjective:    Patient ID: Theresa Malone, female    DOB: 1981-10-02, 43 y.o.   MRN: [id number]  HPI: Theresa Malone is a 43 y.o. female presenting with a 2 day history of URI symptoms and nausea. She was tested at urgent care yesterday for covid and flu but tests were negative. She was prescribed zofran  for her nausea. She reports that she has not vomited. She reports body aches, headache, runny nose, sore throat, and cough that is worse at night. She denies any fever.  She also reports a rash to bilateral arms, hands, and right knee that has been present since the middle of August 2025. Patent states she noticed it after starting the second round of Humara. She reports it is itchy. She has tried hydrocortisone cream but has not been helpful in reducing the itch.Patient states she moisturizes daily. She reports she did not take her Humara dose this week due to the itchiness      06/19/2024    4:22 PM 12/31/2023   11:24 AM 09/30/2023    3:11 PM  Depression screen PHQ 2/9  Decreased Interest 0 0 0  Down, Depressed, Hopeless 0 1 1  PHQ - 2 Score 0 1 1  Altered sleeping 1 0 0  Tired, decreased energy 1 3 0  Change in appetite 0 0 0  Feeling bad or failure about yourself  0 1 2  Trouble concentrating 0 0 2  Moving slowly or fidgety/restless 0 3 1  Suicidal thoughts 0 1 0  PHQ-9 Score 2 9 6   Difficult doing work/chores Not difficult at all Somewhat difficult Somewhat difficult    Relevant past medical, surgical, family and social history reviewed and updated as indicated. Interim medical history since our last visit reviewed. Allergies and medications reviewed and updated.  Review of Systems  Constitutional: Negative for fever or weight change.  Respiratory: Positive for cough and reports occasional shortness of breath with cough  Cardiovascular:  Negative for chest pain or palpitations.  Gastrointestinal: Negative for abdominal pain, no bowel changes.  Musculoskeletal: Negative for gait problem or joint swelling.  Skin: Positive for rash to bilateral arms and right knee  Neurological: Positive for headache. Negative for dizziness. .  No other specific complaints in a complete review of systems (except as listed in HPI above).      Objective:     BP (!) 144/86   Pulse 100   Temp 98.6 F (37 C)   Resp 18   Ht 5' 1 (1.549 m)   Wt 226 lb 1.6 oz (102.6 kg)   BMI 42.72 kg/m    Wt Readings from Last 3 Encounters:  07/29/24 226 lb 1.6 oz (102.6 kg)  06/19/24 231 lb 11.2 oz (105.1 kg)  12/31/23 250 lb 8 oz (113.6 kg)    Physical Exam Constitutional:      Appearance: Normal appearance.  HENT:     Head: Normocephalic and atraumatic.     Right Ear: Tympanic membrane normal.     Left Ear: Tympanic membrane normal.     Nose:     Right Sinus: Maxillary sinus tenderness present.     Left Sinus: Maxillary sinus tenderness present.     Mouth/Throat:  Mouth: Mucous membranes are moist.     Pharynx: No oropharyngeal exudate or posterior oropharyngeal erythema.  Eyes:     Pupils: Pupils are equal, round, and reactive to light.  Cardiovascular:     Rate and Rhythm: Normal rate and regular rhythm.     Pulses: Normal pulses.     Heart sounds: Normal heart sounds.  Pulmonary:     Effort: Pulmonary effort is normal.     Breath sounds: Normal breath sounds.  Musculoskeletal:     Cervical back: Normal range of motion and neck supple.  Skin:    General: Skin is warm and dry.     Findings: Rash present. Rash is not pustular or urticarial.     Comments: Arms, hands, and right knee   Neurological:     General: No focal deficit present.     Mental Status: She is alert.  Psychiatric:        Mood and Affect: Mood normal.        Behavior: Behavior normal.        Thought Content: Thought content normal.      Results for orders  placed or performed in visit on 07/29/24  POCT rapid strep A   Collection Time: 07/29/24  3:14 PM  Result Value Ref Range   Rapid Strep A Screen Negative Negative  POC Covid19/Flu A&B Antigen   Collection Time: 07/29/24  3:16 PM  Result Value Ref Range   Influenza A Antigen, POC Negative Negative   Influenza B Antigen, POC Negative Negative   Covid Antigen, POC Negative Negative          Assessment & Plan:   Problem List Items Addressed This Visit   None Visit Diagnoses       Viral upper respiratory tract infection    -  Primary   Tessalon  perles sent to pharmamcy. Can use 3 times daily as needed.   Relevant Medications   benzonatate  (TESSALON  PERLES) 100 MG capsule   Other Relevant Orders   POC Covid19/Flu A&B Antigen (Completed)   POCT rapid strep A (Completed)     Rash       Kenalog  cream sent to pharmacy. Apply twice daily as needed to affected areas. Continue to moisturize daily.   Relevant Medications   triamcinolone  ointment (KENALOG ) 0.1 %       -Discussed symptom management for URI symptoms and nausea -COVID and FLU tests were done at urgent care yesterday and were both negative  -Advised on the importance of rest and hydration  -Tessalon  Perles sent to pharmacy (take 3 times a day as needed) -Zofran  was previously prescribed through urgent care for her nausea  -For rash to bilateral arms, hands, and right knee, kenalog  cream was sent to pharmacy. Apply cream 2 times a day to affected areas.  -Also informed patient that she can use her Hydroxyzine  at night to help with the itching -Discussed following up on Monday if symptoms have not improved and we will talk about sending in an antibiotic         Follow up plan: Return if symptoms worsen or fail to improve.  I have reviewed this encounter including the documentation in this note and/or discussed this patient with the provider, Aislinn Womack, SNP, I am certifying that I agree with the content of this  note as supervising/preceptor nurse practitioner.  Mliss Spray, FNP-C [facility] 07/29/2024, 3:54 PM

## 2024-07-29 NOTE — Telephone Encounter (Signed)
 Scheduled for same day OV d/t patient being immunocompromised. ED precautions reviewed, pt verbalized understanding.  FYI Only or Action Required?: FYI only for provider.  Patient was last seen in primary care on 01/21/2024 by Myrla Jon HERO, MD.  Called Nurse Triage reporting URI.  Symptoms began yesterday.  Interventions attempted: Rest, hydration, or home remedies.  Symptoms are: unchanged.  Triage Disposition: See Physician Within 4 Hours (or PCP Triage)  Patient/caregiver understands and will follow disposition?: Yes  Answer Assessment - Initial Assessment Questions Patient reports that for 2 days, she has developed URI symptoms and nausea that starts at 2pm. Was seen at [facility] yesterday for same and was neg for covid and flu. States they ordered zofran  but she has not picked it up. States is still nauseated but has not vomited today.    RESPIRATORY DISTRESS: Describe your breathing.      Mild SOB at onset of symptoms that resolves  OTHER SYMPTOMS: Do you have any other symptoms? (e.g., earache, mouth sores, sore throat, wheezing)     Nausea  Protocols used: Common Cold-A-AH Copied from CRM #[id number]. Topic: Clinical - Red Word Triage >> Jul 29, 2024  2:06 PM Jasmin G wrote: Red Word that prompted transfer to Nurse Triage: Pt believes that she has an upper respiratory track infection and a rash possibly due to her taking Humira due to her having a low immune system.

## 2024-08-14 NOTE — Progress Notes (Deleted)
 ANNUAL GYNECOLOGICAL EXAM  SUBJECTIVE  HPI  Theresa Malone is a 43 y.o.-year-old [id number] who presents for an annual gynecological exam today.  She denies pelvic pain, dyspareunia, abnormal vaginal bleeding or discharge, and UTI symptoms. ***  Medical/Surgical History Past Medical History:  Diagnosis Date   Anxiety    panic attacks   Cellulitis    Depression    Diabetes mellitus    Gout    Hidradenitis suppurativa    Hypertension    Obesity    Venous stasis    Venous stasis dermatitis    Past Surgical History:  Procedure Laterality Date   CESAREAN SECTION     C/S x 2   CESAREAN SECTION N/A 01/24/2015   Procedure: CESAREAN SECTION;  Surgeon: Winton Felt, MD;  Location: [facility];  Service: Obstetrics;  Laterality: N/A;   INCISION AND DRAINAGE ABSCESS Left 09/23/2023   Procedure: INCISION AND DRAINAGE OF BREAST ABSCESS;  Surgeon: Jordis Laneta FALCON, MD;  Location: [facility];  Service: General;  Laterality: Left;   IRRIGATION AND DEBRIDEMENT ABSCESS Right 09/23/2023   Procedure: INCISION AND DRAINAGE OF LOWER LEG ABSCESS;  Surgeon: Jordis Laneta FALCON, MD;  Location: [facility];  Service: General;  Laterality: Right;   TUBAL LIGATION      Social History Lives with ***. ***Feels safe there Work: Exercise: Substances: ***EtOH, tobacco, vape, and recreational drugs  Obstetric History OB History     Gravida  4   Para  4   Term  2   Preterm  2   AB      Living  4      SAB      IAB      Ectopic      Multiple  0   Live Births  4            GYN/Menstrual History No LMP recorded. (Menstrual status: Other). {Regular/irregular menstrual period abdominal pain hpi md:[id number]} Last Pap: Contraception:  Prevention Dentist Eye exam Mammogram Colonoscopy Flu shot/vaccines  Current Medications Outpatient Medications Prior to Visit  Medication Sig   amLODipine  (NORVASC ) 5 MG tablet Take 1 tablet (5 mg total) by mouth daily.   BD PEN NEEDLE MICRO U/F 32G X 6 MM  MISC Inject into the skin daily.   benzonatate  (TESSALON  PERLES) 100 MG capsule Take 2 capsules (200 mg total) by mouth 3 (three) times daily as needed.   clindamycin -benzoyl peroxide (BENZACLIN) gel Apply topically 2 (two) times daily.   Continuous Glucose Receiver (DEXCOM G7 RECEIVER) DEVI    Continuous Glucose Sensor (DEXCOM G7 SENSOR) MISC Change every 10 days use to continuously monitor blood sugar   cyclobenzaprine  (FLEXERIL ) 5 MG tablet Take 1 tablet by mouth three times daily as needed for muscle spasm (Patient not taking: Reported on 07/29/2024)   escitalopram  (LEXAPRO ) 20 MG tablet Take 2 tablets (40 mg total) by mouth daily.   gabapentin  (NEURONTIN ) 300 MG capsule Take 2 capsules (600 mg total) by mouth 3 (three) times daily.   GVOKE HYPOPEN 2-PACK 1 MG/0.2ML SOAJ Inject into the skin.   hydrOXYzine  (VISTARIL ) 25 MG capsule Take 1 capsule (25 mg total) by mouth every 8 (eight) hours as needed.   Ibuprofen -Acetaminophen  (ACETAMINOPHEN -IBUPROFEN ) 125-250 MG TABS Take 2 tablets by mouth in the morning and at bedtime.   iron  polysaccharides (NIFEREX) 150 MG capsule Take 1 capsule (150 mg total) by mouth daily.   LANTUS  SOLOSTAR 100 UNIT/ML Solostar Pen Inject 50 Units into the skin 2 (two) times daily. (  Patient taking differently: Inject 30 Units into the skin 2 (two) times daily.)   MOUNJARO  7.5 MG/0.5ML Pen INJECT ONE SYRINGEFUL INTO THE SKIN ONCE WEEKLY   mupirocin  ointment (BACTROBAN ) 2 % Apply 1 Application topically 2 (two) times daily.   prazosin (MINIPRESS) 2 MG capsule Take 2 mg by mouth at bedtime.   spironolactone  (ALDACTONE ) 100 MG tablet Take 1 tablet (100 mg total) by mouth daily. Take with heavy meal and PLENTY OF WATER   traZODone  (DESYREL ) 50 MG tablet TAKE 1 TABLET BY MOUTH AT BEDTIME AS NEEDED FOR SLEEP   triamcinolone  ointment (KENALOG ) 0.1 % Apply 1 Application topically 2 (two) times daily. To affected areas   valsartan  (DIOVAN ) 80 MG tablet Take 1 tablet (80 mg total)  by mouth daily.   No facility-administered medications prior to visit.        ROS Constitutional: Denied constitutional symptoms, night sweats, recent illness, fatigue, fever, insomnia and weight loss.  Eyes: Denied eye symptoms, eye pain, photophobia, vision change and visual disturbance.  Ears/Nose/Throat/Neck: Denied ear, nose, throat or neck symptoms, hearing loss, nasal discharge, sinus congestion and sore throat.  Cardiovascular: Denied cardiovascular symptoms, arrhythmia, chest pain/pressure, edema, exercise intolerance, orthopnea and palpitations.  Respiratory: Denied pulmonary symptoms, asthma, pleuritic pain, productive sputum, cough, dyspnea and wheezing.  Gastrointestinal: Denied gastro-esophageal reflux, melena, nausea and vomiting.  Genitourinary:*** Denied genitourinary symptoms including symptomatic vaginal discharge, pelvic relaxation issues, and urinary complaints.  Musculoskeletal: Denied musculoskeletal symptoms, stiffness, swelling, muscle weakness and myalgia.  Dermatologic: Denied dermatology symptoms, rash and scar.  Neurologic: Denied neurology symptoms, dizziness, headache, neck pain and syncope.  Psychiatric: Denied psychiatric symptoms, anxiety and depression.  Endocrine: Denied endocrine symptoms including hot flashes and night sweats.    OBJECTIVE  There were no vitals taken for this visit.   Physical examination General NAD, Conversant  HEENT Atraumatic; Op clear with mmm.  Normo-cephalic. Pupils reactive. Anicteric sclerae  Thyroid/Neck Smooth without nodularity or enlargement. Normal ROM.  Neck Supple.  Skin No rashes, lesions or ulceration. Normal palpated skin turgor. No nodularity.  Breasts: No masses or discharge.  Symmetric.  No axillary adenopathy.  Lungs: Clear to auscultation.No rales or wheezes. Normal Respiratory effort, no retractions.  Heart: NSR.  No murmurs or rubs appreciated. No peripheral edema  Abdomen: Soft.  Non-tender.  No  masses.  No HSM. No hernia  Extremities: Moves all appropriately.  Normal ROM for age. No lymphadenopathy.  Neuro: Oriented to PPT.  Normal mood. Normal affect.     Pelvic:   Vulva: Normal appearance.  No lesions.  Vagina: No lesions or abnormalities noted.  Support: Normal pelvic support.  Urethra No masses tenderness or scarring.  Meatus Normal size without lesions or prolapse.  Cervix: Normal appearance.  No lesions.  Anus: Normal exam.  No lesions.  Perineum: Normal exam.  No lesions.        Bimanual   Uterus: Normal size.  Non-tender.  Mobile.  AV.  Adnexae: No masses.  Non-tender to palpation.  Cul-de-sac: Negative for abnormality.    ASSESSMENT  1) Annual exam  PLAN 1) Physical exam as noted. Discussed healthy lifestyle choices and preventive care. 2) 3) Return in one year for annual exam or as needed for concerns.   Melissa Swanson, CNM

## 2024-08-17 ENCOUNTER — Encounter: Payer: MEDICAID | Admitting: Obstetrics

## 2024-08-17 DIAGNOSIS — Z7689 Persons encountering health services in other specified circumstances: Secondary | ICD-10-CM

## 2024-08-19 ENCOUNTER — Other Ambulatory Visit: Payer: Self-pay | Admitting: Family Medicine

## 2024-08-19 DIAGNOSIS — G4700 Insomnia, unspecified: Secondary | ICD-10-CM

## 2024-08-21 ENCOUNTER — Ambulatory Visit: Payer: Self-pay | Admitting: *Deleted

## 2024-08-21 NOTE — Telephone Encounter (Signed)
 Copied from CRM #[id number]. Topic: Clinical - Red Word Triage >> Aug 21, 2024  9:08 AM Antony RAMAN wrote: Red Word that prompted transfer to Nurse Triage: Off humara for a month, joint pain in hand cant bend pointer finger, swollen to capacity. Also broken out with boils, one on breast and 2 on bottom. Reason for Disposition  [1] Boil AND [2] diabetes mellitus or weak immune system (e.g., HIV positive, cancer chemo, splenectomy, organ transplant, chronic steroids)  Answer Assessment - Initial Assessment Questions 1. APPEARANCE of BOIL: What does the boil look like?      I have boils one on my left breast and 2 on buttocks. 2 weeks now   It came up as a painful spot.   I could feel it coming.   It's to a head and it's an open wound.   I have diabetes.    I was on Humara I started getting a lot of URI and calling out of work.   I was getting rashes.   I've not been on it for a month now.   Now I have these boils coming up.   The boils are draining on my bottom.   I'm wearing pull ups. I can't move my pointer finger.  My joint is swollen on my right hand.  I can't bend it.   2. LOCATION: Where is the boil located?      See above 3. NUMBER: How many boils are there?      3 total 4. SIZE: How big is the boil? (e.g., inches, cm; compare to size of a coin or other object)     I also have a rash on my arm.  I saw a doctor for that and she prescribed me a cream but I never got it because I could not afford it.     I have a dermatologist appt coming up.    In the meantime can my doctor do anything about all these issues. I'm depressed and don't want to get out of bed.   I just don't feel like doing anything.   Denies suicidal thoughts.   I just don't like my life right now.   I'm just tired.   5. ONSET: When did the boil start?     2 weeks 6. PAIN: Is there any pain? If Yes, ask: How bad is the pain?   (Scale 1-10; or mild, moderate, severe)     Joint pain has come back since being off the  Humara.  7. FEVER: Do you have a fever? If Yes, ask: What is it, how was it measured, and when did it start?      No 8. SOURCE: Have you been around anyone with boils or other Staph infections? Have you ever had boils before?     I'm immunosuppressed  9. OTHER SYMPTOMS: Do you have any other symptoms? (e.g., shaking chills, weakness, rash elsewhere on body)     See above 10. PREGNANCY: Is there any chance you are pregnant? When was your last menstrual period?       Not asked  Protocols used: Boil (Skin Abscess)-A-AH FYI Only or Action Required?: FYI only for provider.  Patient was last seen in primary care on 07/29/2024 by Gareth Mliss FALCON, FNP.  Called Nurse Triage reporting Mass.Has a boil on left breast and 2 on buttocks that are draining.   Also index finger is very swollen.  Having joint pain and depression since being off Humara.  Was having side effects from it and had to come off of it.  Symptoms began several weeks ago.2 weeks ago  Interventions attempted: Nothing.Wears pads over boils due to them draining.  Also depressed.  I let her know about the [phone] number and going to the ED if she started having thoughts of harming herself.   Denies suicidal thoughts.     Symptoms are: gradually worsening.Boils are painful and draining  Triage Disposition: See Physician Within 24 HoursNo appts with any providers at [facility].   Appt made with Mliss Spray, at [facility] for 08/24/2024 at 2:00.   Pt will go to the urgent care over the weekend if her symptoms become worse.   Patient/caregiver understands and will follow disposition?: Yes

## 2024-08-24 ENCOUNTER — Encounter: Payer: Self-pay | Admitting: Nurse Practitioner

## 2024-08-24 ENCOUNTER — Ambulatory Visit (INDEPENDENT_AMBULATORY_CARE_PROVIDER_SITE_OTHER): Payer: MEDICAID | Admitting: Nurse Practitioner

## 2024-08-24 VITALS — BP 130/74 | HR 90 | Resp 16 | Ht 61.0 in | Wt 225.0 lb

## 2024-08-24 DIAGNOSIS — L732 Hidradenitis suppurativa: Secondary | ICD-10-CM

## 2024-08-24 DIAGNOSIS — L0291 Cutaneous abscess, unspecified: Secondary | ICD-10-CM

## 2024-08-24 DIAGNOSIS — B379 Candidiasis, unspecified: Secondary | ICD-10-CM | POA: Diagnosis not present

## 2024-08-24 DIAGNOSIS — F332 Major depressive disorder, recurrent severe without psychotic features: Secondary | ICD-10-CM | POA: Diagnosis not present

## 2024-08-24 DIAGNOSIS — T3695XA Adverse effect of unspecified systemic antibiotic, initial encounter: Secondary | ICD-10-CM

## 2024-08-24 MED ORDER — MUPIROCIN 2 % EX OINT: 1.0000 | TOPICAL_OINTMENT | Freq: Two times a day (BID) | CUTANEOUS | 1 refills | Status: AC

## 2024-08-24 MED ORDER — CEPHALEXIN 500 MG PO CAPS: 500.0000 mg | ORAL_CAPSULE | Freq: Two times a day (BID) | ORAL | 0 refills | Status: AC
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
## 2024-08-24 NOTE — Telephone Encounter (Signed)
 Late entry    I lost power at the end of this call so completing charting.   The call was finished but I had to complete the chart this morning. Pt was scheduled with Mliss Spray at [facility] for 08/24/2024 at 2:00 because no appts available at [facility]. Routed to [facility] this morning.

## 2024-08-25 MED ORDER — FLUCONAZOLE 150 MG PO TABS: 150.0000 mg | ORAL_TABLET | ORAL | 0 refills | Status: DC | PRN

## 2024-08-25 NOTE — Addendum Note (Signed)
 Addended by: Tiernan Suto F on: 08/25/2024 07:45 AM   Modules accepted: Orders

## 2024-08-31 ENCOUNTER — Other Ambulatory Visit: Payer: Self-pay

## 2024-08-31 ENCOUNTER — Emergency Department
Admission: EM | Admit: 2024-08-31 | Discharge: 2024-08-31 | Disposition: A | Discharge status: Home / Self Care | Payer: MEDICAID | Attending: Emergency Medicine | Admitting: Emergency Medicine

## 2024-08-31 DIAGNOSIS — E1122 Type 2 diabetes mellitus with diabetic chronic kidney disease: Secondary | ICD-10-CM | POA: Diagnosis not present

## 2024-08-31 DIAGNOSIS — L732 Hidradenitis suppurativa: Secondary | ICD-10-CM | POA: Diagnosis present

## 2024-08-31 DIAGNOSIS — N189 Chronic kidney disease, unspecified: Secondary | ICD-10-CM | POA: Insufficient documentation

## 2024-08-31 DIAGNOSIS — I129 Hypertensive chronic kidney disease with stage 1 through stage 4 chronic kidney disease, or unspecified chronic kidney disease: Secondary | ICD-10-CM | POA: Diagnosis not present

## 2024-08-31 DIAGNOSIS — N764 Abscess of vulva: Secondary | ICD-10-CM | POA: Insufficient documentation

## 2024-08-31 DIAGNOSIS — L0291 Cutaneous abscess, unspecified: Secondary | ICD-10-CM

## 2024-08-31 LAB — CBC WITH DIFFERENTIAL/PLATELET
Abs Immature Granulocytes: 0.06 K/uL (ref 0.00–0.07)
Basophils Absolute: 0 K/uL (ref 0.0–0.1)
Basophils Relative: 0 %
Eosinophils Absolute: 0.2 K/uL (ref 0.0–0.5)
Eosinophils Relative: 2 %
HCT: 27 % — ABNORMAL LOW (ref 36.0–46.0)
Hemoglobin: 8.4 g/dL — ABNORMAL LOW (ref 12.0–15.0)
Immature Granulocytes: 1 %
Lymphocytes Relative: 20 %
Lymphs Abs: 1.8 K/uL (ref 0.7–4.0)
MCH: 26.7 pg (ref 26.0–34.0)
MCHC: 31.1 g/dL (ref 30.0–36.0)
MCV: 85.7 fL (ref 80.0–100.0)
Monocytes Absolute: 0.8 K/uL (ref 0.1–1.0)
Monocytes Relative: 9 %
Neutro Abs: 6.1 K/uL (ref 1.7–7.7)
Neutrophils Relative %: 68 %
Platelets: 375 K/uL (ref 150–400)
RBC: 3.15 MIL/uL — ABNORMAL LOW (ref 3.87–5.11)
RDW: 14.6 % (ref 11.5–15.5)
WBC: 9 K/uL (ref 4.0–10.5)
nRBC: 0 % (ref 0.0–0.2)

## 2024-08-31 LAB — LACTIC ACID, PLASMA: Lactic Acid, Venous: 0.9 mmol/L (ref 0.5–1.9)

## 2024-08-31 LAB — COMPREHENSIVE METABOLIC PANEL WITH GFR
ALT: 25 U/L (ref 0–44)
AST: 18 U/L (ref 15–41)
Albumin: 2.8 g/dL — ABNORMAL LOW (ref 3.5–5.0)
Alkaline Phosphatase: 158 U/L — ABNORMAL HIGH (ref 38–126)
Anion gap: 13 (ref 5–15)
BUN: 28 mg/dL — ABNORMAL HIGH (ref 6–20)
CO2: 20 mmol/L — ABNORMAL LOW (ref 22–32)
Calcium: 8.8 mg/dL — ABNORMAL LOW (ref 8.9–10.3)
Chloride: 103 mmol/L (ref 98–111)
Creatinine, Ser: 1.68 mg/dL — ABNORMAL HIGH (ref 0.44–1.00)
GFR, Estimated: 39 mL/min — ABNORMAL LOW (ref >60)
Glucose, Bld: 217 mg/dL — ABNORMAL HIGH (ref 70–99)
Potassium: 4 mmol/L (ref 3.5–5.1)
Sodium: 136 mmol/L (ref 135–145)
Total Bilirubin: 0.3 mg/dL (ref 0.0–1.2)
Total Protein: 7.7 g/dL (ref 6.5–8.1)

## 2024-08-31 MED ORDER — LORAZEPAM 1 MG PO TABS
0.5000 mg | ORAL_TABLET | Freq: Once | ORAL | Status: AC
  Administered 2024-08-31: 0.5 mg via ORAL
  Filled 2024-08-31: qty 1

## 2024-08-31 MED ORDER — CLINDAMYCIN PHOSPHATE 600 MG/50ML IV SOLN
600.0000 mg | Freq: Once | INTRAVENOUS | Status: AC
  Administered 2024-08-31: 600 mg via INTRAVENOUS
  Filled 2024-08-31: qty 50

## 2024-08-31 MED ORDER — LIDOCAINE-PRILOCAINE 2.5-2.5 % EX CREA
TOPICAL_CREAM | Freq: Once | CUTANEOUS | Status: AC
  Administered 2024-08-31: 1 via TOPICAL
  Filled 2024-08-31: qty 5

## 2024-08-31 MED ORDER — OXYCODONE-ACETAMINOPHEN 5-325 MG PO TABS
1.0000 | ORAL_TABLET | Freq: Once | ORAL | Status: AC
  Administered 2024-08-31: 1 via ORAL
  Filled 2024-08-31: qty 1

## 2024-08-31 MED ORDER — LIDOCAINE HCL (PF) 1 % IJ SOLN
5.0000 mL | Freq: Once | INTRAMUSCULAR | Status: AC
  Administered 2024-08-31: 5 mL
  Filled 2024-08-31: qty 5

## 2024-08-31 MED ORDER — CLINDAMYCIN HCL 300 MG PO CAPS: 300.0000 mg | ORAL_CAPSULE | Freq: Three times a day (TID) | ORAL | 0 refills | Status: AC

## 2024-08-31 MED ORDER — OXYCODONE HCL 5 MG PO TABS: 5.0000 mg | ORAL_TABLET | Freq: Four times a day (QID) | ORAL | 0 refills | Status: DC | PRN

## 2024-08-31 MED ORDER — CLINDAMYCIN HCL 300 MG PO CAPS
300.0000 mg | ORAL_CAPSULE | Freq: Three times a day (TID) | ORAL | 0 refills | Status: DC
Start: 2024-08-31 — End: 2024-08-31

## 2024-08-31 NOTE — ED Triage Notes (Signed)
 Pt to ED via POV from [facility]. Pt has large open abscess on left breast x2 wks and is on antibiotics. Pt with hx hidradenitis suppurativa

## 2024-08-31 NOTE — Discharge Instructions (Addendum)
 Consider calling and make an appointment with the surgeon listed on your discharge papers who is on-call day for an appointment in the future to consider further surgery.  Began doing sitz bath's 2-3 times per day if possible which will increase circulation and encourage drainage.  A second antibiotic was sent to the pharmacy for you to take with the antibiotic that you are currently taking.  Also take a probiotic or eat yogurt to prevent any yeast infections or diarrhea from the antibiotic.  A pain medication was sent to the pharmacy as well.  Do not drive or operate machinery while taking the pain medication as it could cause drowsiness and increase your risk for injury.  Return to the emergency department if any severe worsening of your symptoms such as nausea, vomiting, inability to take the antibiotic or fever and chills.  Also while taking the pain medication take a stool softener to prevent constipation.

## 2024-08-31 NOTE — ED Provider Notes (Signed)
 [facility] Provider Note    Event Date/Time   First MD Initiated Contact with Patient 08/31/24 1123     (approximate)   History   No chief complaint on file.   HPI  Theresa Malone is a 43 y.o. female   presents to the ED from [facility].  Patient states she has a large open abscess to her left breast for the last 2 weeks that opened on its home.  Patient currently is taking cephalexin  which she states does not seem to be working and she was sent here from [facility] acute care for IV antibiotics.  Patient has a history of hidradenitis.  She also has 2 other areas that are beginning and are becoming painful.  Patient has history also of hypertension, diabetes, prolonged QT intervals, anxiety, gout, CKD, anemia, morbid obesity and polyarthralgia.  Patient had I&D during hospitalization October 2024 by Dr. Jordis.   Physical Exam   Triage Vital Signs: ED Triage Vitals  Encounter Vitals Group     BP 08/31/24 0925 (!) 165/81     Girls Systolic BP Percentile --      Girls Diastolic BP Percentile --      Boys Systolic BP Percentile --      Boys Diastolic BP Percentile --      Pulse Rate 08/31/24 0925 81     Resp 08/31/24 0925 16     Temp 08/31/24 0925 98.2 F (36.8 C)     Temp Source 08/31/24 0925 Oral     SpO2 08/31/24 0925 99 %     Weight --      Height --      Head Circumference --      Peak Flow --      Pain Score 08/31/24 0924 10     Pain Loc --      Pain Education --      Exclude from Growth Chart --     Most recent vital signs: Vitals:   08/31/24 0925 08/31/24 1128  BP: (!) 165/81   Pulse: 81   Resp: 16   Temp: 98.2 F (36.8 C)   SpO2: 99% 99%     General: Awake, no distress.  CV:  Good peripheral perfusion.  Resp:  Normal effort.  Abd:  No distention.  Other:  Left breast anteriorly is a 2.5 oval superficial open abscess without active drainage.  Area is tender to palpation.  There is also a area of tenderness beneath  her right breast without erythema or localized abscess and midline with an area that is tender and most likely is becoming an abscess but no fluctuance is noted at this time.  Also right labial area with multiple areas of scar tissue from previous abscesses with moderate tenderness to light palpation.  No active drainage at this time.  No vaginal discharge.   ED Results / Procedures / Treatments   Labs (all labs ordered are listed, but only abnormal results are displayed) Labs Reviewed  COMPREHENSIVE METABOLIC PANEL WITH GFR - Abnormal; Notable for the following components:      Result Value   CO2 20 (*)    Glucose, Bld 217 (*)    BUN 28 (*)    Creatinine, Ser 1.68 (*)    Calcium 8.8 (*)    Albumin 2.8 (*)    Alkaline Phosphatase 158 (*)    GFR, Estimated 39 (*)    All other components within normal limits  CBC WITH DIFFERENTIAL/PLATELET -  Abnormal; Notable for the following components:   RBC 3.15 (*)    Hemoglobin 8.4 (*)    HCT 27.0 (*)    All other components within normal limits  LACTIC ACID, PLASMA  LACTIC ACID, PLASMA      PROCEDURES:  Critical Care performed:   .Incision and Drainage  Date/Time: 08/31/2024 1:55 PM  Performed by: Saunders Shona CROME, PA-C Authorized by: Saunders Shona CROME, PA-C   Consent:    Consent obtained:  Verbal   Consent given by:  Patient   Risks discussed:  Infection and incomplete drainage   Alternatives discussed:  No treatment Universal protocol:    Patient identity confirmed:  Verbally with patient Location:    Type:  Abscess   Location:  Anogenital   Anogenital location:  Vulva Pre-procedure details:    Skin preparation:  Antiseptic wash Sedation:    Sedation type:  Anxiolysis Anesthesia:    Anesthesia method:  Topical application and local infiltration   Topical anesthetic:  EMLA cream   Local anesthetic:  Lidocaine  1% w/o epi Procedure type:    Complexity:  Simple Procedure details:    Incision types:  Stab incision    Incision depth:  Dermal   Drainage:  Bloody and purulent   Drainage amount:  Scant   Wound treatment:  Wound left open   Packing materials:  None Post-procedure details:    Procedure completion:  Tolerated well, no immediate complications    MEDICATIONS ORDERED IN ED: Medications  oxyCODONE -acetaminophen  (PERCOCET/ROXICET) 5-325 MG per tablet 1 tablet (1 tablet Oral Given 08/31/24 1154)  clindamycin  (CLEOCIN ) IVPB 600 mg (0 mg Intravenous Stopped 08/31/24 1242)  lidocaine  (PF) (XYLOCAINE ) 1 % injection 5 mL (5 mLs Infiltration Given 08/31/24 1345)  lidocaine -prilocaine (EMLA) cream (1 Application Topical Given 08/31/24 1345)  LORazepam  (ATIVAN ) tablet 0.5 mg (0.5 mg Oral Given 08/31/24 1350)     IMPRESSION / MDM / ASSESSMENT AND PLAN / ED COURSE  I reviewed the triage vital signs and the nursing notes.   Differential diagnosis includes, but is not limited to, multiple abscesses, chronic recurrent abscesses, multi area hidradenitis.  43 year old female presents to the ED with complaint of multiple abscesses.  Patient states that the cephalexin  that she is on currently is not working well and has been taking it for a couple of days.  Lab work was compared with previous CMP's with BUN and creatinine in keeping with previous lab work for her CKD.  Lactic acid was 0.9.  Patient was afebrile.  She was given Percocet while in the emergency department and then attempt to I&D was made with minimal drainage.  I encouraged her to do sitz bath's 2-3 times a day.  She was given clindamycin  600 mg IV while in the ED and a one-time dose of lorazepam  for anxiety.  A prescription for clindamycin  was sent to the pharmacy along with a prescription for oxycodone  as needed for pain.  I strongly suggested that she call make an appointment with the surgeon as the areas that are involved are so extensive and chronic that she will only continue to have problems in the future.  She is to continue taking the cephalexin   until completely finished.  She was made aware that she should return to the emergency department should she have any worsening of her symptoms, fever or chills.    Clinical Course as of 08/31/24 1552  Mon Aug 31, 2024  1134 BUN(!): 28 [RS]    Clinical Course User Index [RS] Saunders,  Shona CROME, PA-C   Patient's presentation is most consistent with acute illness / injury with system symptoms.  FINAL CLINICAL IMPRESSION(S) / ED DIAGNOSES   Final diagnoses:  Abscess of multiple sites  Hidradenitis suppurativa     Rx / DC Orders   ED Discharge Orders          Ordered    oxyCODONE  (OXY IR/ROXICODONE ) 5 MG immediate release tablet  Every 6 hours PRN,   Status:  Discontinued        08/31/24 1501    clindamycin  (CLEOCIN ) 300 MG capsule  3 times daily,   Status:  Discontinued        08/31/24 1501    clindamycin  (CLEOCIN ) 300 MG capsule  3 times daily        08/31/24 1512    oxyCODONE  (OXY IR/ROXICODONE ) 5 MG immediate release tablet  Every 6 hours PRN        08/31/24 1512             Note:  This document was prepared using Dragon voice recognition software and may include unintentional dictation errors.   Saunders Shona CROME, PA-C 08/31/24 1552    Bradler, Evan K, MD 09/02/24 [phone]

## 2024-09-01 ENCOUNTER — Ambulatory Visit: Payer: Self-pay

## 2024-09-01 ENCOUNTER — Encounter: Payer: Self-pay | Admitting: Family Medicine

## 2024-09-01 ENCOUNTER — Ambulatory Visit (INDEPENDENT_AMBULATORY_CARE_PROVIDER_SITE_OTHER): Payer: MEDICAID | Admitting: Family Medicine

## 2024-09-01 VITALS — BP 132/75 | HR 103 | Ht 61.0 in | Wt 233.2 lb

## 2024-09-01 DIAGNOSIS — L0291 Cutaneous abscess, unspecified: Secondary | ICD-10-CM

## 2024-09-01 DIAGNOSIS — N611 Abscess of the breast and nipple: Secondary | ICD-10-CM

## 2024-09-01 DIAGNOSIS — L732 Hidradenitis suppurativa: Secondary | ICD-10-CM | POA: Diagnosis not present

## 2024-09-01 MED ORDER — OXYCODONE HCL 10 MG PO TABS: 10.0000 mg | ORAL_TABLET | Freq: Four times a day (QID) | ORAL | 0 refills | Status: AC | PRN

## 2024-09-01 NOTE — Telephone Encounter (Signed)
 FYI Only or Action Required?: Action required by provider: request for appointment.  Patient was last seen in primary care on 08/24/2024 by Gareth Mliss FALCON, FNP.  Called Nurse Triage reporting Abscess.  Symptoms began several weeks ago.  Interventions attempted: Prescription medications: clindamycin  , percocet .  Symptoms are: gradually worsening.  Triage Disposition: See Physician Within 24 Hours  Patient/caregiver understands and will follow disposition?: YesCopied from CRM #[id number]. Topic: Clinical - Red Word Triage >> Sep 01, 2024 11:10 AM Mia F wrote: Red Word that prompted transfer to Nurse Triage: Pt state she has 3 abscesses. One that was cut yesterday, and one under each her breast. She says she was given pain medication that is not helping at all. She says the pain is unbearable and she is in need of something to help Reason for Disposition  [1] Red area or streak AND [2] no fever  Answer Assessment - Initial Assessment Questions Ongoing abscesses for last 2 weeks. Pt was seen at ED yesterday. Pt is taking abx and pain medication. Pt stated I'm hurting like hell. I'm no miserable. When I pee, it runs on the bottom abscess and kills me. My pain is 11/10. I taking percocet every 6 hours and no relief. I need something stronger. I have appt with Curtis tomorrow but I can't wait. I need  help.       1. LOCATION: Where is the wound located?      3 - under left breast; under right breast, bottom  2. WOUND APPEARANCE: What does the wound look like?      Abscesses- 3. SIZE: If redness is present, ask: What is the size of the red area? (Inches, centimeters, or compare to size of a coin)      Its a mess  4. SPREAD: What's changed in the last day?  Do you see any red streaks coming from the wound?     Oozing  5. ONSET: When did it start to look infected?      2 weeks ago  6. MECHANISM: How did the wound start, what was the cause?     Not sure 7. PAIN: Is there  any pain? If Yes, ask: How bad is the pain?   (Scale 1-10; or mild, moderate, severe)     11 8. FEVER: Do you have a fever? If Yes, ask: What is your temperature, how was it measured, and when did it start?     denies 9. OTHER SYMPTOMS: Do you have any other symptoms? (e.g., shaking chills, weakness, rash elsewhere on body)     Miserable all over  Protocols used: Wound Infection-A-AH

## 2024-09-01 NOTE — Telephone Encounter (Signed)
 Noted

## 2024-09-01 NOTE — Progress Notes (Signed)
 Acute visit   Patient: Theresa Malone   DOB: [birth year]   43 y.o. Female  MRN: [id number] PCP: [name], FNP   Chief Complaint  Patient presents with   Acute Visit    skin abscesses; rn triage today Pt state she has 3 abscesses. One that was cut yesterday, and one under each her breast. She says she was given pain medication that is not helping at all. She says the pain is unbearable and she is in need of something to help   Abscess of multiple sites    Ongoing abscesses for last 2 weeks. Pt was seen at ED yesterday. Pt is taking abx and pain medication. Pt stated I'm hurting like hell. I'm no miserable. When I pee, it runs on the bottom abscess and kills me. My pain is 11/10. I taking percocet every 6 hours and no relief. I need something stronger. I have appt with Curtis tomorrow but I can't wait. I need  help.     Subjective    Discussed the use of AI scribe software for clinical note transcription with the patient, who gave verbal consent to proceed.  History of Present Illness   Theresa Malone is a 43 year old female with hidradenitis suppurativa who presents with painful abscesses.  She has multiple painful abscesses, with the most severe on her tail and under her breast. The tail abscess was lanced in the ER yesterday, and she is on clindamycin  and cephalexin . An abscess under her breast has not come to a head, and a new one is developing on her wrist. She takes oxycodone  5 mg, two at a time, due to inadequate relief from a single pill, and is concerned about running out. She also uses ibuprofen  and dual-action Tylenol  for pain. Pain affects her sleep and causes irritability. Soap and water cause burning in the affected areas. She previously discontinued Humira due to recurrent infections and rashes.        Review of Systems  Objective    BP 132/75 (BP Location: Left Arm, Patient Position: Sitting, Cuff Size: Normal)   Pulse (!) 103   Ht 5' 1 (1.549 m)   Wt  233 lb 3.2 oz (105.8 kg)   BMI 44.06 kg/m  Physical Exam   L anterior breast with superficial open abscess about 2.5 cm with active drainage. R breast with indurated abscess +TTP. R labial abscess, open and draining. Multiple scars from previous abscesses  Appears uncomfortable, tearful.  No results found for any visits on 09/01/24.  Assessment & Plan     Problem List Items Addressed This Visit       Musculoskeletal and Integument   Hidradenitis suppurativa     Other   Breast abscess - Primary   Other Visit Diagnoses       Abscess              Hidradenitis suppurativa with recurrent abscesses and acute pain Chronic hidradenitis suppurativa with recurrent abscesses, currently experiencing acute pain. Abscesses located on the tail, under the breast, and on the buttocks. Recent lancing of abscesses in the ER with ongoing severe pain. Current treatment includes clindamycin  and cephalexin . Pain management with oxycodone  5 mg was insufficient, requiring increased dosage. - Prescribe oxycodone  10 mg for pain management, instructing not to take two of the 10 mg tablets at once. - Continue clindamycin  and cephalexin  as prescribed. - Advise consistent use of ibuprofen  or acetaminophen  three times a day for  baseline pain control. - Recommend sitz baths for soothing the area, especially the buttocks. - Advise use of nonstick gauze to manage drainage from abscesses. - Suggest over-the-counter lidocaine  cream for topical pain relief, available in the burn section. - Encourage follow-up with dermatology for further management of hidradenitis.          Meds ordered this encounter  Medications   oxyCODONE  10 MG TABS    Sig: Take 1 tablet (10 mg total) by mouth every 6 (six) hours as needed for severe pain (pain score 7-10).    Dispense:  15 tablet    Refill:  0     No follow-ups on file.      Jon Eva, MD  [facility] [phone]  (phone) [phone] (fax)  [facility]

## 2024-09-02 ENCOUNTER — Encounter: Payer: Self-pay | Admitting: Family Medicine

## 2024-09-02 ENCOUNTER — Telehealth: Payer: Self-pay | Admitting: Family Medicine

## 2024-09-02 ENCOUNTER — Ambulatory Visit: Payer: MEDICAID | Admitting: Family Medicine

## 2024-09-02 NOTE — Telephone Encounter (Signed)
 Ok to give work note for her to be out until 10/10 as requested

## 2024-09-02 NOTE — Telephone Encounter (Signed)
 Note provided. Available in letters

## 2024-09-02 NOTE — Telephone Encounter (Signed)
 Copied from CRM #[id number]. 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 Patient needs letter ASAP, letter can be uploaded to MyChart.

## 2024-09-11 ENCOUNTER — Emergency Department: Payer: MEDICAID

## 2024-09-11 ENCOUNTER — Emergency Department
Admission: EM | Admit: 2024-09-11 | Discharge: 2024-09-11 | Disposition: A | Discharge status: Home / Self Care | Payer: MEDICAID | Attending: Emergency Medicine | Admitting: Emergency Medicine

## 2024-09-11 ENCOUNTER — Other Ambulatory Visit: Payer: Self-pay

## 2024-09-11 DIAGNOSIS — E1122 Type 2 diabetes mellitus with diabetic chronic kidney disease: Secondary | ICD-10-CM | POA: Diagnosis not present

## 2024-09-11 DIAGNOSIS — N189 Chronic kidney disease, unspecified: Secondary | ICD-10-CM | POA: Insufficient documentation

## 2024-09-11 DIAGNOSIS — I129 Hypertensive chronic kidney disease with stage 1 through stage 4 chronic kidney disease, or unspecified chronic kidney disease: Secondary | ICD-10-CM | POA: Diagnosis not present

## 2024-09-11 DIAGNOSIS — R197 Diarrhea, unspecified: Secondary | ICD-10-CM | POA: Diagnosis not present

## 2024-09-11 DIAGNOSIS — D649 Anemia, unspecified: Secondary | ICD-10-CM | POA: Insufficient documentation

## 2024-09-11 DIAGNOSIS — R0789 Other chest pain: Secondary | ICD-10-CM | POA: Diagnosis present

## 2024-09-11 DIAGNOSIS — J40 Bronchitis, not specified as acute or chronic: Secondary | ICD-10-CM | POA: Diagnosis not present

## 2024-09-11 LAB — CBC
HCT: 27.1 % — ABNORMAL LOW (ref 36.0–46.0)
Hemoglobin: 8.6 g/dL — ABNORMAL LOW (ref 12.0–15.0)
MCH: 27 pg (ref 26.0–34.0)
MCHC: 31.7 g/dL (ref 30.0–36.0)
MCV: 85 fL (ref 80.0–100.0)
Platelets: 352 K/uL (ref 150–400)
RBC: 3.19 MIL/uL — ABNORMAL LOW (ref 3.87–5.11)
RDW: 14.7 % (ref 11.5–15.5)
WBC: 11 K/uL — ABNORMAL HIGH (ref 4.0–10.5)
nRBC: 0 % (ref 0.0–0.2)

## 2024-09-11 LAB — BASIC METABOLIC PANEL WITH GFR
Anion gap: 12 (ref 5–15)
BUN: 24 mg/dL — ABNORMAL HIGH (ref 6–20)
CO2: 22 mmol/L (ref 22–32)
Calcium: 8.8 mg/dL — ABNORMAL LOW (ref 8.9–10.3)
Chloride: 99 mmol/L (ref 98–111)
Creatinine, Ser: 1.46 mg/dL — ABNORMAL HIGH (ref 0.44–1.00)
GFR, Estimated: 46 mL/min — ABNORMAL LOW (ref >60)
Glucose, Bld: 132 mg/dL — ABNORMAL HIGH (ref 70–99)
Potassium: 3.8 mmol/L (ref 3.5–5.1)
Sodium: 133 mmol/L — ABNORMAL LOW (ref 135–145)

## 2024-09-11 LAB — RESP PANEL BY RT-PCR (RSV, FLU A&B, COVID)  RVPGX2
Influenza A by PCR: NEGATIVE
Influenza B by PCR: NEGATIVE
Resp Syncytial Virus by PCR: NEGATIVE
SARS Coronavirus 2 by RT PCR: NEGATIVE

## 2024-09-11 LAB — TROPONIN I (HIGH SENSITIVITY): Troponin I (High Sensitivity): 8 ng/L (ref <18)

## 2024-09-11 LAB — CBG MONITORING, ED: Glucose-Capillary: 132 mg/dL — ABNORMAL HIGH (ref 70–99)

## 2024-09-11 MED ORDER — IPRATROPIUM-ALBUTEROL 0.5-2.5 (3) MG/3ML IN SOLN
3.0000 mL | Freq: Once | RESPIRATORY_TRACT | Status: AC
  Administered 2024-09-11: 3 mL via RESPIRATORY_TRACT
  Filled 2024-09-11: qty 3

## 2024-09-11 MED ORDER — GUAIFENESIN ER 600 MG PO TB12: 600.0000 mg | ORAL_TABLET | Freq: Two times a day (BID) | ORAL | 0 refills | Status: AC

## 2024-09-11 MED ORDER — ALBUTEROL SULFATE HFA 108 (90 BASE) MCG/ACT IN AERS: 2.0000 | INHALATION_SPRAY | Freq: Four times a day (QID) | RESPIRATORY_TRACT | 0 refills | Status: AC | PRN

## 2024-09-11 NOTE — ED Provider Notes (Signed)
 [facility] Provider Note    Event Date/Time   First MD Initiated Contact with Patient 09/11/24 [phone]     (approximate)   History   Chief Complaint Chest Pain   HPI  Theresa Malone is a 43 y.o. female with past medical history of hypertension, diabetes, and CKD who presents to the ED complaining of chest pain.  Patient reports that she has had 3 days of pain in her chest that she describes as sharp and worse when she coughs.  In between coughing episodes, she describes a tightness in her chest with some mild difficulty breathing.  She has had a frequent dry cough over the past few days with subjective fevers and chills, also describes diarrhea without nausea or vomiting.  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     Physical Exam   Triage Vital Signs: ED Triage Vitals [09/11/24 0904]  Encounter Vitals Group     BP (!) 154/97     Girls Systolic BP Percentile      Girls Diastolic BP Percentile      Boys Systolic BP Percentile      Boys Diastolic BP Percentile      Pulse Rate 87     Resp 18     Temp 98.4 F (36.9 C)     Temp Source Oral     SpO2 100 %     Weight 226 lb (102.5 kg)     Height 5' 1 (1.549 m)     Head Circumference      Peak Flow      Pain Score 8     Pain Loc      Pain Education      Exclude from Growth Chart     Most recent vital signs: Vitals:   09/11/24 1030 09/11/24 1115  BP: 139/71   Pulse: 90 92  Resp: 15 19  Temp:    SpO2: 100% 100%    Constitutional: Alert and oriented. Eyes: Conjunctivae are normal. Head: Atraumatic. Nose: No congestion/rhinnorhea. Mouth/Throat: Mucous membranes are moist.  Cardiovascular: Normal rate, regular rhythm. Grossly normal heart sounds.  2+ radial pulses bilaterally. Respiratory: Normal respiratory effort.  No retractions. Lungs CTAB. Gastrointestinal: Soft and nontender. No distention. Musculoskeletal: No lower extremity  tenderness nor edema.  Neurologic:  Normal speech and language. No gross focal neurologic deficits are appreciated.    ED Results / Procedures / Treatments   Labs (all labs ordered are listed, but only abnormal results are displayed) Labs Reviewed  BASIC METABOLIC PANEL WITH GFR - Abnormal; Notable for the following components:      Result Value   Sodium 133 (*)    Glucose, Bld 132 (*)    BUN 24 (*)    Creatinine, Ser 1.46 (*)    Calcium 8.8 (*)    GFR, Estimated 46 (*)    All other components within normal limits  CBC - Abnormal; Notable for the following components:   WBC 11.0 (*)    RBC 3.19 (*)    Hemoglobin 8.6 (*)    HCT 27.1 (*)    All other components within normal limits  CBG MONITORING, ED - Abnormal; Notable for the following components:   Glucose-Capillary 132 (*)    All other components within normal limits  RESP PANEL BY RT-PCR (RSV, FLU A&B, COVID)  RVPGX2  POC URINE PREG, ED  TROPONIN I (HIGH SENSITIVITY)  EKG  ED ECG REPORT I, Carlin Palin, the attending physician, personally viewed and interpreted this ECG.   Date: 09/11/2024  EKG Time: 9:05  Rate: 79  Rhythm: normal sinus rhythm  Axis: Normal  Intervals:none  ST&T Change: None  RADIOLOGY Chest x-ray reviewed and interpreted by me with no infiltrate, edema, or effusion.  PROCEDURES:  Critical Care performed: No  Procedures   MEDICATIONS ORDERED IN ED: Medications  ipratropium-albuterol  (DUONEB) 0.5-2.5 (3) MG/3ML nebulizer solution 3 mL (3 mLs Nebulization Given 09/11/24 0944)     IMPRESSION / MDM / ASSESSMENT AND PLAN / ED COURSE  I reviewed the triage vital signs and the nursing notes.                              43 y.o. female with past medical history of hypertension, diabetes, and CKD who presents to the ED complaining of chest tightness and sharp pain when coughing over the past 3 days with some difficulty breathing and diarrhea.  Patient's presentation is most  consistent with acute presentation with potential threat to life or bodily function.  Differential diagnosis includes, but is not limited to, ACS, PE, pneumonia, pneumothorax, musculoskeletal pain, GERD, anxiety, bronchitis, COVID-19.  Patient nontoxic-appearing and in no acute distress, vital signs are unremarkable.  EKG shows no evidence of arrhythmia or ischemia, troponin within normal limits and symptoms seem atypical for ACS.  Chest x-ray is unremarkable and I suspect bronchitis given her recent cough with subjective fevers and chills.  Additional labs show stable CKD and anemia without significant leukocytosis or electrolyte abnormality.  COVID and flu testing is negative, patient feels better following breathing treatment.  She is appropriate for outpatient management, will prescribe albuterol  and Mucinex .  She was counseled to return to the ED for new or worsening symptoms, patient agrees with plan.      FINAL CLINICAL IMPRESSION(S) / ED DIAGNOSES   Final diagnoses:  Atypical chest pain  Bronchitis     Rx / DC Orders   ED Discharge Orders          Ordered    albuterol  (VENTOLIN  HFA) 108 (90 Base) MCG/ACT inhaler  Every 6 hours PRN       Note to Pharmacy: Please supply with spacer   09/11/24 1136    guaiFENesin  (MUCINEX ) 600 MG 12 hr tablet  2 times daily        09/11/24 1136             Note:  This document was prepared using Dragon voice recognition software and may include unintentional dictation errors.   Palin Carlin, MD 09/11/24 1139

## 2024-09-11 NOTE — ED Notes (Signed)
 Pt stating she thinks her blood sugar is low and would like it checked. This RN checked sugar level, 132. Pt stating she felt jittery, reminded pt she received breathing treatment and that may make her feel jittery and make her HR increase.

## 2024-09-11 NOTE — ED Triage Notes (Signed)
 Patient states chest pain and shortness of breath x 3 days, pain is worse with coughing/sneezing. Patient was initially at [facility] and told them she had intermittent vision changes in her right eye and was sent here for further evaluation. Patient reports normal vision at this time.

## 2024-09-11 NOTE — ED Notes (Signed)
 Pt taken to XR.

## 2024-10-05 ENCOUNTER — Ambulatory Visit: Payer: MEDICAID | Admitting: Family Medicine

## 2024-10-07 ENCOUNTER — Telehealth: Payer: Self-pay

## 2024-10-07 NOTE — Telephone Encounter (Signed)
 Copied from CRM [id number]. Topic: Clinical - Medication Question >> Oct 07, 2024  2:49 PM Roselie BROCKS wrote: Reason for CRM: Patient needs to know if can get increased  to stronger 7.5 MOUNJARO  7.5 MG/0.5ML request a call back on thisl

## 2024-10-08 ENCOUNTER — Ambulatory Visit: Payer: MEDICAID

## 2024-10-08 ENCOUNTER — Encounter: Payer: Self-pay | Admitting: Family Medicine

## 2024-10-08 ENCOUNTER — Ambulatory Visit: Payer: MEDICAID | Admitting: Family Medicine

## 2024-10-08 VITALS — BP 160/90 | HR 87 | Resp 16 | Ht 61.0 in | Wt 231.6 lb

## 2024-10-08 DIAGNOSIS — E1122 Type 2 diabetes mellitus with diabetic chronic kidney disease: Secondary | ICD-10-CM

## 2024-10-08 DIAGNOSIS — E1159 Type 2 diabetes mellitus with other circulatory complications: Secondary | ICD-10-CM | POA: Diagnosis not present

## 2024-10-08 DIAGNOSIS — N1831 Chronic kidney disease, stage 3a: Secondary | ICD-10-CM

## 2024-10-08 DIAGNOSIS — N1832 Chronic kidney disease, stage 3b: Secondary | ICD-10-CM

## 2024-10-08 DIAGNOSIS — G8929 Other chronic pain: Secondary | ICD-10-CM

## 2024-10-08 DIAGNOSIS — M2550 Pain in unspecified joint: Secondary | ICD-10-CM

## 2024-10-08 DIAGNOSIS — Z7985 Long-term (current) use of injectable non-insulin antidiabetic drugs: Secondary | ICD-10-CM | POA: Diagnosis not present

## 2024-10-08 DIAGNOSIS — F32A Depression, unspecified: Secondary | ICD-10-CM

## 2024-10-08 DIAGNOSIS — Z794 Long term (current) use of insulin: Secondary | ICD-10-CM

## 2024-10-08 DIAGNOSIS — I152 Hypertension secondary to endocrine disorders: Secondary | ICD-10-CM

## 2024-10-08 DIAGNOSIS — F419 Anxiety disorder, unspecified: Secondary | ICD-10-CM

## 2024-10-08 MED ORDER — FREESTYLE LIBRE 3 PLUS SENSOR MISC: 3 refills | Status: AC

## 2024-10-08 MED ORDER — MELOXICAM 7.5 MG PO TABS: 7.5000 mg | ORAL_TABLET | Freq: Every day | ORAL | 0 refills | Status: AC

## 2024-10-08 MED ORDER — MOUNJARO 10 MG/0.5ML ~~LOC~~ SOAJ: 10.0000 mg | SUBCUTANEOUS | 2 refills | Status: DC

## 2024-10-08 NOTE — Progress Notes (Signed)
 Established Patient Office Visit  Introduced to nurse practitioner role and practice setting.  All questions answered.  Discussed provider/patient relationship and expectations.   Subjective   Patient ID: Theresa Malone, female    DOB: 06-06-1981  Age: 43 y.o. MRN: [id number]  Chief Complaint  Patient presents with   Arthritis    Feet and legs   Weight Management Screening    Patient would like to increase Mounjaro  dosage.    Discussed the use of AI scribe software for clinical note transcription with the patient, who gave verbal consent to proceed.  History of Present Illness Theresa Malone is a 43 year old female with diabetes and hypertension who presents with concerns about medication management and emotional stress.  She is managing her diabetes with Mounjaro  7.5 mg and reports a perceived weight loss plateau. Her fasting blood sugar levels range from 136 to 200 mg/dL, and she has not had an A1c check in over six months. She occasionally uses Lantus  30 units and Novolog  3 units, particularly after dietary indiscretions, such as during blood sugar spikes to over 300 mg/dL.  For hypertension, she is on amlodipine  5 mg and valsartan  80 mg. She mentions her blood pressure is elevated due to stress and is considering a refill for valsartan  next week due to financial constraints.  She reports significant emotional stress following a conflict daughter -  exacerbated her anxiety and depression, leading to increased use of hydroxyzine , taking two pills to manage her symptoms. She also takes trazodone  and melatonin to aid sleep. She has been unable to connect with a psychiatrist for further mental health support despite a referral.  She has a history of arthritis and reports increased pain and difficulty walking, similar to previous episodes. She has not been on her HS shot for three months due to a rash. Previous treatments like prednisone  caused adverse effects, including elevated blood  sugars.      08/24/2024    2:32 PM 06/19/2024    4:22 PM 12/31/2023   11:24 AM  Depression screen PHQ 2/9  Decreased Interest 3 0 0  Down, Depressed, Hopeless 3 0 1  PHQ - 2 Score 6 0 1  Altered sleeping 3 1 0  Tired, decreased energy 3 1 3   Change in appetite 3 0 0  Feeling bad or failure about yourself  3 0 1  Trouble concentrating 3 0 0  Moving slowly or fidgety/restless 3 0 3  Suicidal thoughts 0 0 1  PHQ-9 Score 24  2  9    Difficult doing work/chores Very difficult Not difficult at all Somewhat difficult     Data saved with a previous flowsheet row definition       06/19/2024    4:22 PM 12/31/2023   11:25 AM 09/30/2023    3:13 PM 07/21/2021   11:36 AM  GAD 7 : Generalized Anxiety Score  Nervous, Anxious, on Edge 0 1 1 1   Control/stop worrying 1 1 3 1   Worry too much - different things 1 1 3 1   Trouble relaxing 0 1 3 1   Restless 0 0 0 1  Easily annoyed or irritable 0 3 2 1   Afraid - awful might happen 0 3 3 2   Total GAD 7 Score 2 10 15 8   Anxiety Difficulty Not difficult at all  Somewhat difficult      ROS  Negative unless indicated in HPI   Objective:     BP (!) 160/90 (BP Location: Left Arm,  Patient Position: Sitting, Cuff Size: Large)   Pulse 87   Resp 16   Ht 5' 1 (1.549 m)   Wt 231 lb 9.6 oz (105.1 kg)   SpO2 100%   BMI 43.76 kg/m    Physical Exam Constitutional:      General: She is not in acute distress.    Appearance: Normal appearance. She is obese. She is not ill-appearing, toxic-appearing or diaphoretic.  HENT:     Head: Normocephalic.  Eyes:     Extraocular Movements: Extraocular movements intact.     Pupils: Pupils are equal, round, and reactive to light.  Cardiovascular:     Rate and Rhythm: Normal rate and regular rhythm.     Pulses: Normal pulses.     Heart sounds: Normal heart sounds. No murmur heard.    No friction rub. No gallop.  Pulmonary:     Effort: No respiratory distress.     Breath sounds: No stridor. No wheezing,  rhonchi or rales.  Chest:     Chest wall: No tenderness.  Musculoskeletal:     Right lower leg: No edema.     Left lower leg: No edema.  Skin:    General: Skin is warm and dry.     Capillary Refill: Capillary refill takes less than 2 seconds.  Neurological:     General: No focal deficit present.     Mental Status: She is alert and oriented to person, place, and time. Mental status is at baseline.  Psychiatric:        Mood and Affect: Mood is depressed. Affect is tearful.        Behavior: Behavior normal.        Thought Content: Thought content normal.        Judgment: Judgment normal.      Results for orders placed or performed in visit on 10/08/24  Hemoglobin A1c  Result Value Ref Range   Hgb A1c MFr Bld 6.5 (H) 4.8 - 5.6 %   Est. average glucose Bld gHb Est-mCnc 140 mg/dL  Urine microalbumin-creatinine with uACR  Result Value Ref Range   Creatinine, Urine 105.0 Not Estab. mg/dL   Microalbumin, Urine 8,992.5 Not Estab. ug/mL   Microalb/Creat Ratio 959 (H) 0 - 29 mg/g creat  Comprehensive metabolic panel with GFR  Result Value Ref Range   Glucose 260 (H) 70 - 99 mg/dL   BUN 25 (H) 6 - 24 mg/dL   Creatinine, Ser 8.43 (H) 0.57 - 1.00 mg/dL   eGFR 43 (L) >40 fO/fpw/8.26   BUN/Creatinine Ratio 16 9 - 23   Sodium 133 (L) 134 - 144 mmol/L   Potassium 4.4 3.5 - 5.2 mmol/L   Chloride 99 96 - 106 mmol/L   CO2 20 20 - 29 mmol/L   Calcium 9.0 8.7 - 10.2 mg/dL   Total Protein 7.4 6.0 - 8.5 g/dL   Albumin 3.4 (L) 3.9 - 4.9 g/dL   Globulin, Total 4.0 1.5 - 4.5 g/dL   Bilirubin Total 0.3 0.0 - 1.2 mg/dL   Alkaline Phosphatase 284 (H) 41 - 116 IU/L   AST 13 0 - 40 IU/L   ALT 18 0 - 32 IU/L  Lipid panel  Result Value Ref Range   Cholesterol, Total 227 (H) 100 - 199 mg/dL   Triglycerides 808 (H) 0 - 149 mg/dL   HDL 69 >60 mg/dL   VLDL Cholesterol Cal 33 5 - 40 mg/dL   LDL Chol Calc (NIH) 874 (H) 0 - 99 mg/dL  Chol/HDL Ratio 3.3 0.0 - 4.4 ratio      The 10-year ASCVD risk  score (Arnett DK, et al., 2019) is: 7.7%    Assessment & Plan:  Type 2 diabetes mellitus with stage 3b chronic kidney disease, with long-term current use of insulin  (HCC)  Type 2 diabetes mellitus with stage 3a chronic kidney disease, without long-term current use of insulin  (HCC) -     Hemoglobin A1c -     Microalbumin / creatinine urine ratio -     Comprehensive metabolic panel with GFR -     Mounjaro ; Inject 10 mg into the skin once a week.  Dispense: 2 mL; Refill: 2 -     Lipid panel -     FreeStyle Libre 3 Plus Sensor; Change sensor every 15 days.  Dispense: 6 each; Refill: 3  Hypertension associated with diabetes (HCC) -     amLODIPine  Besylate; Take 1 tablet (5 mg total) by mouth daily.  Dispense: 90 tablet; Refill: 1 -     Valsartan ; Take 1 tablet (80 mg total) by mouth daily.  Dispense: 90 tablet; Refill: 1  Type 2 diabetes mellitus with chronic kidney disease, with long-term current use of insulin , unspecified CKD stage (HCC) -     FreeStyle Libre 3 Plus Sensor; Change sensor every 15 days.  Dispense: 6 each; Refill: 3  Polyarthralgia -     Meloxicam ; Take 1 tablet (7.5 mg total) by mouth daily.  Dispense: 30 tablet; Refill: 0 -     Ambulatory referral to Pain Clinic  Morbid obesity (HCC)  Chronic joint pain -     Ambulatory referral to Pain Clinic  Anxiety and depression     Assessment and Plan Assessment & Plan Type 2 diabetes mellitus - associated with diabetic chronic kidney disease, stage 3b - managed with Mounjaro , Lantus , and Novolog  per current medication list- pt states does take mounjaro  weekly. She stopped taking the meal time insulin  and took the lantus  on halloween due to elevated sugars.  - Blood glucose levels range from 136 to 200 mg/dL per patient, but unable to show provider today levels, states takes with finger stick, does not have meter with her - No A1c check  in over six months. - Increased Mounjaro  to 10 mg for diabetes control. - will hold  insulin  if A1c is <7% - Checked labs to assess current diabetes control and kidney function. - previously seen by endocrine in June - no recent follow up - check urine ACR as well - on ARB - recommend continuing CGM - may need statin based on risk  Hypertension - Chronic, elevated today - pt is very tearful and going through significant family stress the past several weeks - uncertain if patient has taken today, history is poor - had patient meet with [facility] pharmacist - Pt to schedule appointment and bring in all medications to clarify what she is taking - Active on list is: Amlodipine  5mg  daily - continue valsartan  80mg  daily - will reorder medications - GOAL<130/80 - pt appears to be intermittently controlled when she does consistently take medication - check CMP  Depression and anxiety Exacerbated by recent family conflict and stress. Increased anxiety requiring hydroxyzine . Previous referral to behavioral health specialist not followed up.  - continue Escitalopram  40mg  daily - Provided phone number for behavioral health specialist for follow-up. - Continue trazodone  50mg  nightly prn for sleep. DO not use with hydroxyzine   Polyarthralgia/ RA Significant pain and functional limitations. Recent exacerbation with pain in  shoulders, back, arms, and feet. Previous steroid use not tolerated due to hyperglycemia.  - Pt is seeing Rheumatology mid December - Would like to also be Referred to pain management specialist. - Prescribed meloxicam  prn for acute pain management - continue gabapentin  600mg  prn TID - CMP checked  CKD, stage 3b - recheck CMP - check urine acr - encourage increasing water intake - may need SGLT-2 for DMII and kidney protection   Hidradenitis suppurativa - sees dermatology - Recent severe outbreak. - on spirinolactone 100mg  daily - states she is getting approved for a new injection, BImzelx    Return in about 4 weeks (around 11/05/2024) for Chronic  DIsease MGMT and to establish with new PCP.    Curtis DELENA Boom, FNP

## 2024-10-08 NOTE — Progress Notes (Signed)
 S:     Reason for visit: ?  Theresa Malone is a 43 y.o. female with a history of diabetes (type 2), who presents today for an initial diabetes Face to Face pharmacotherapy visit.? Pertinent PMH also includes HTN, CKD stage 3b, obesity, gout.  Care Team: Primary Care Provider: [name], FNP  Today, patient reports she has been infrequently taking Lantus  at 30 units once daily when BG is >300 mg/dL, about once a week. She also reports some concerns with feeling that Mounjaro  dose is no longer curbing her appetite.   Current diabetes medications include: Lantus  30 units BID (not taking), Mounjaro  7.5 mg weekly  Previous diabetes medications include: metformin  Current hypertension medications include: amlodipine  5 mg daily, spironolactone  100 mg daily, valsartan  80 mg daily Current hyperlipidemia medications include: none  Patient reports adherence to taking all medications as prescribed, however, she is unable to confirm which medications she currently takes. She also takes Lantus  inconsistently.   Have you been experiencing any side effects to the medications prescribed? no Do you have any problems obtaining medications due to transportation or finances? no Insurance coverage: [employer] [facility] Medicaid  Patient denies hypoglycemic events.  Reported home fasting blood sugars: 130-150 mg/dL   Patient reports nocturia (nighttime urination).  Patient denies neuropathy (nerve pain). Patient denies visual changes. Patient reports self foot exams.   Patient-reported exercise habits: none reported  DM Prevention:  Statin: Not taking.?  ACE/ARB: yes; valsartan  History of chronic kidney disease? yes Last urinary albumin/creatinine ratio:  Lab Results  Component Value Date   MICRALBCREAT WILL FOLLOW 10/08/2024   MICRALBCREAT 422 (H) 02/15/2023   Last eye exam:  Lab Results  Component Value Date   HMDIABEYEEXA Retinopathy (A) 04/23/2024   Lab Results  Component Value Date    HMDIABEYEEXA Retinopathy (A) 04/23/2024   Last foot exam: No foot exam found Tobacco Use:  Tobacco Use: Low Risk  (10/08/2024)   Patient History    Smoking Tobacco Use: Never    Smokeless Tobacco Use: Never    Passive Exposure: Not on file   O:   Vitals:  Wt Readings from Last 3 Encounters:  10/08/24 231 lb 9.6 oz (105.1 kg)  09/11/24 226 lb (102.5 kg)  09/01/24 233 lb 3.2 oz (105.8 kg)   BP Readings from Last 3 Encounters:  10/08/24 (!) 160/90  09/11/24 139/71  09/01/24 132/75   Pulse Readings from Last 3 Encounters:  10/08/24 87  09/11/24 92  09/01/24 (!) 103     Labs:?  Lab Results  Component Value Date   HGBA1C 6.5 (H) 10/08/2024   HGBA1C 11.4 (H) 09/21/2023   HGBA1C 10.3 (A) 06/27/2023   GLUCOSE 260 (H) 10/08/2024   MICRALBCREAT WILL FOLLOW 10/08/2024   MICRALBCREAT 422 (H) 02/15/2023   CREATININE 1.56 (H) 10/08/2024   CREATININE 1.46 (H) 09/11/2024   CREATININE 1.68 (H) 08/31/2024    Lab Results  Component Value Date   CHOL 227 (H) 10/08/2024   LDLCALC 125 (H) 10/08/2024   LDLCALC 113 (H) 02/15/2023   LDLCALC Comment 03/28/2016   HDL 69 10/08/2024   TRIG 191 (H) 10/08/2024   TRIG 229 (H) 02/15/2023   TRIG 470 (H) 03/28/2016   ALT 18 10/08/2024   ALT 25 08/31/2024   AST 13 10/08/2024   AST 18 08/31/2024      Chemistry      Component Value Date/Time   NA 133 (L) 10/08/2024 1605   K 4.4 10/08/2024 1605  CL 99 10/08/2024 1605   CO2 20 10/08/2024 1605   BUN 25 (H) 10/08/2024 1605   CREATININE 1.56 (H) 10/08/2024 1605   CREATININE 1.43 (H) 09/30/2023 1606      Component Value Date/Time   CALCIUM 9.0 10/08/2024 1605   ALKPHOS 284 (H) 10/08/2024 1605   AST 13 10/08/2024 1605   ALT 18 10/08/2024 1605   BILITOT 0.3 10/08/2024 1605       The 10-year ASCVD risk score (Arnett DK, et al., 2019) is: 7.7%  Lab Results  Component Value Date   MICRALBCREAT WILL FOLLOW 10/08/2024   MICRALBCREAT 422 (H) 02/15/2023    A/P: Diabetes  currently controlled with a most recent A1c of 6.5% on 10/08/24. Patient is able to verbalize appropriate hypoglycemia management plan. Medication adherence appears suboptimal for insulin . Will hold insulin  therapy at this time and increase Mounjaro  dose. Previously used Dexcom CGM for BG monitoring, but her phone is no longer compatible. Will start Freestyle Libre CGM, as A1c can be an inaccurate measurement of BG with CKD. -HOLD basal insulin  Lantus  (insulin  glargine). Will re-evaluate necessity at follow up pending CGM report readings -Increased dose of GLP-1 Mounjaro  (tirzepatide )  from 7.5 to 10 mg weekly.  -Patient educated on purpose, proper use, and potential adverse effects of Mounjaro .  -Extensively discussed pathophysiology of diabetes, recommended lifestyle interventions, dietary effects on blood sugar control.  -Counseled on s/sx of and management of hypoglycemia.  -Begin monitoring BG via Freestyle Libre 3+ CGM. Samples provided in clinic  ASCVD risk - primary prevention in patient with diabetes. Last LDL is 125 mg/dL, not at goal of <29 mg/dL. 10-year ASCVD risk score of 9.4%. moderate intensity statin indicated. Patient requested to discuss at follow up.  Hypertension longstanding currently uncontrolled. Blood pressure goal of <130/80 mmHg. Medication adherence suboptimal and unable to fully assess which medications she is taking at this time. Will have patient present with pill bottles to follow up.  Patient verbalized understanding of treatment plan. Total time patient counseling 30 minutes.  Follow-up:  Pharmacist on 10/15/24 PCP clinic visit on 11/09/24  Peyton CHARLENA Ferries, PharmD, CPP Clinical Pharmacist [facility] [phone]

## 2024-10-08 NOTE — Telephone Encounter (Signed)
Patient coming in today to be seen.

## 2024-10-09 LAB — HEMOGLOBIN A1C
Est. average glucose Bld gHb Est-mCnc: 140 mg/dL
Hgb A1c MFr Bld: 6.5 % — ABNORMAL HIGH (ref 4.8–5.6)

## 2024-10-09 LAB — LIPID PANEL
Chol/HDL Ratio: 3.3 ratio (ref 0.0–4.4)
Cholesterol, Total: 227 mg/dL — ABNORMAL HIGH (ref 100–199)
HDL: 69 mg/dL (ref >39)
LDL Chol Calc (NIH): 125 mg/dL — ABNORMAL HIGH (ref 0–99)
Triglycerides: 191 mg/dL — ABNORMAL HIGH (ref 0–149)
VLDL Cholesterol Cal: 33 mg/dL (ref 5–40)

## 2024-10-09 LAB — MICROALBUMIN / CREATININE URINE RATIO
Creatinine, Urine: 105 mg/dL
Microalb/Creat Ratio: 959 mg/g{creat} — ABNORMAL HIGH (ref 0–29)
Microalbumin, Urine: 1007.4 ug/mL

## 2024-10-09 LAB — COMPREHENSIVE METABOLIC PANEL WITH GFR
ALT: 18 IU/L (ref 0–32)
AST: 13 IU/L (ref 0–40)
Albumin: 3.4 g/dL — ABNORMAL LOW (ref 3.9–4.9)
Alkaline Phosphatase: 284 IU/L — ABNORMAL HIGH (ref 41–116)
BUN/Creatinine Ratio: 16 (ref 9–23)
BUN: 25 mg/dL — ABNORMAL HIGH (ref 6–24)
Bilirubin Total: 0.3 mg/dL (ref 0.0–1.2)
CO2: 20 mmol/L (ref 20–29)
Calcium: 9 mg/dL (ref 8.7–10.2)
Chloride: 99 mmol/L (ref 96–106)
Creatinine, Ser: 1.56 mg/dL — ABNORMAL HIGH (ref 0.57–1.00)
Globulin, Total: 4 g/dL (ref 1.5–4.5)
Glucose: 260 mg/dL — ABNORMAL HIGH (ref 70–99)
Potassium: 4.4 mmol/L (ref 3.5–5.2)
Sodium: 133 mmol/L — ABNORMAL LOW (ref 134–144)
Total Protein: 7.4 g/dL (ref 6.0–8.5)
eGFR: 43 mL/min/1.73 — ABNORMAL LOW (ref >59)

## 2024-10-11 ENCOUNTER — Encounter: Payer: Self-pay | Admitting: Family Medicine

## 2024-10-11 ENCOUNTER — Ambulatory Visit: Payer: Self-pay | Admitting: Family Medicine

## 2024-10-11 MED ORDER — VALSARTAN 80 MG PO TABS: 80.0000 mg | ORAL_TABLET | Freq: Every day | ORAL | 1 refills | Status: AC

## 2024-10-11 MED ORDER — AMLODIPINE BESYLATE 5 MG PO TABS: 5.0000 mg | ORAL_TABLET | Freq: Every day | ORAL | 1 refills | Status: AC

## 2024-10-12 ENCOUNTER — Telehealth: Payer: Self-pay

## 2024-10-12 ENCOUNTER — Other Ambulatory Visit (HOSPITAL_COMMUNITY): Payer: Self-pay

## 2024-10-12 NOTE — Telephone Encounter (Signed)
 Pharmacy Patient Advocate Encounter   Received notification from Onbase that prior authorization for FreeStyle Libre 3 Plus Sensor  is required/requested.   Insurance verification completed.   The patient is insured through [employer].   Per test claim: PA required; PA submitted to above mentioned insurance via Latent Key/confirmation #/EOC [id number] Status is pending

## 2024-10-13 ENCOUNTER — Other Ambulatory Visit (HOSPITAL_COMMUNITY): Payer: Self-pay

## 2024-10-13 NOTE — Telephone Encounter (Signed)
 Pharmacy Patient Advocate Encounter  Received notification from [facility] that Prior Authorization for FreeStyle Libre 3 Plus Sensor  has been APPROVED from 10/13/24 to 04/12/25. Ran test claim, Copay is $0. This test claim was processed through [facility]- copay amounts may vary at other pharmacies due to pharmacy/plan contracts, or as the patient moves through the different stages of their insurance plan.   PA #/Case ID/Reference #: [id number]

## 2024-10-15 ENCOUNTER — Ambulatory Visit: Payer: MEDICAID

## 2024-10-15 NOTE — Progress Notes (Deleted)
 S:     Reason for visit: ?  Theresa Malone is a 43 y.o. female with a history of diabetes (type 2), who presents today for an initial diabetes Face to Face pharmacotherapy visit.? Pertinent PMH also includes HTN, CKD stage 3b, obesity, gout.  Care Team: Primary Care Provider: Wellington Malone LABOR, FNP  Today, patient reports she has been infrequently taking Lantus  at 30 units once daily when BG is >300 mg/dL, about once a week. She also reports some concerns with feeling that Mounjaro  dose is no longer curbing her appetite.   Current diabetes medications include: Lantus  30 units BID (not taking), Mounjaro  7.5 mg weekly  Previous diabetes medications include: metformin  Current hypertension medications include: amlodipine  5 mg daily, spironolactone  100 mg daily, valsartan  80 mg daily Current hyperlipidemia medications include: none  Patient reports adherence to taking all medications as prescribed, however, she is unable to confirm which medications she currently takes. She also takes Lantus  inconsistently.   Have you been experiencing any side effects to the medications prescribed? no Do you have any problems obtaining medications due to transportation or finances? no Insurance coverage: [employer] Medicaid  Patient denies hypoglycemic events.  Reported home fasting blood sugars: 130-150 mg/dL   Patient reports nocturia (nighttime urination).  Patient denies neuropathy (nerve pain). Patient denies visual changes. Patient reports self foot exams.   Patient-reported exercise habits: none reported  DM Prevention:  Statin: Not taking.?  ACE/ARB: yes; valsartan  History of chronic kidney disease? yes Last urinary albumin/creatinine ratio:  Lab Results  Component Value Date   MICRALBCREAT 959 (H) 10/08/2024   MICRALBCREAT 422 (H) 02/15/2023   Last eye exam:  Lab Results  Component Value Date   HMDIABEYEEXA Retinopathy (A) 04/23/2024   Lab Results  Component Value Date    HMDIABEYEEXA Retinopathy (A) 04/23/2024   Last foot exam: No foot exam found Tobacco Use:  Tobacco Use: Low Risk  (10/11/2024)   Patient History    Smoking Tobacco Use: Never    Smokeless Tobacco Use: Never    Passive Exposure: Not on file   O:   Vitals:  Wt Readings from Last 3 Encounters:  10/08/24 231 lb 9.6 oz (105.1 kg)  09/11/24 226 lb (102.5 kg)  09/01/24 233 lb 3.2 oz (105.8 kg)   BP Readings from Last 3 Encounters:  10/08/24 (!) 160/90  09/11/24 139/71  09/01/24 132/75   Pulse Readings from Last 3 Encounters:  10/08/24 87  09/11/24 92  09/01/24 (!) 103     Labs:?  Lab Results  Component Value Date   HGBA1C 6.5 (H) 10/08/2024   HGBA1C 11.4 (H) 09/21/2023   HGBA1C 10.3 (A) 06/27/2023   GLUCOSE 260 (H) 10/08/2024   MICRALBCREAT 959 (H) 10/08/2024   MICRALBCREAT 422 (H) 02/15/2023   CREATININE 1.56 (H) 10/08/2024   CREATININE 1.46 (H) 09/11/2024   CREATININE 1.68 (H) 08/31/2024    Lab Results  Component Value Date   CHOL 227 (H) 10/08/2024   LDLCALC 125 (H) 10/08/2024   LDLCALC 113 (H) 02/15/2023   LDLCALC Comment 03/28/2016   HDL 69 10/08/2024   TRIG 191 (H) 10/08/2024   TRIG 229 (H) 02/15/2023   TRIG 470 (H) 03/28/2016   ALT 18 10/08/2024   ALT 25 08/31/2024   AST 13 10/08/2024   AST 18 08/31/2024      Chemistry      Component Value Date/Time   NA 133 (L) 10/08/2024 1605   K 4.4 10/08/2024 1605  CL 99 10/08/2024 1605   CO2 20 10/08/2024 1605   BUN 25 (H) 10/08/2024 1605   CREATININE 1.56 (H) 10/08/2024 1605   CREATININE 1.43 (H) 09/30/2023 1606      Component Value Date/Time   CALCIUM 9.0 10/08/2024 1605   ALKPHOS 284 (H) 10/08/2024 1605   AST 13 10/08/2024 1605   ALT 18 10/08/2024 1605   BILITOT 0.3 10/08/2024 1605       The 10-year ASCVD risk score (Arnett DK, et al., 2019) is: 7.7%  Lab Results  Component Value Date   MICRALBCREAT 959 (H) 10/08/2024   MICRALBCREAT 422 (H) 02/15/2023    A/P: Diabetes currently  controlled with a most recent A1c of 6.5% on 10/08/24. Patient is able to verbalize appropriate hypoglycemia management plan. Medication adherence appears suboptimal for insulin . Will hold insulin  therapy at this time and increase Mounjaro  dose. Previously used Dexcom CGM for BG monitoring, but her phone is no longer compatible. Will start Freestyle Libre CGM, as A1c can be an inaccurate measurement of BG with CKD. -HOLD basal insulin  Lantus  (insulin  glargine). Will re-evaluate necessity at follow up pending CGM report readings -Increased dose of GLP-1 Mounjaro  (tirzepatide )  from 7.5 to 10 mg weekly.  -Patient educated on purpose, proper use, and potential adverse effects of Mounjaro .  -Extensively discussed pathophysiology of diabetes, recommended lifestyle interventions, dietary effects on blood sugar control.  -Counseled on s/sx of and management of hypoglycemia.  -Begin monitoring BG via Freestyle Libre 3+ CGM. Samples provided in clinic  ASCVD risk - primary prevention in patient with diabetes. Last LDL is 125 mg/dL, not at goal of <29 mg/dL. 10-year ASCVD risk score of 9.4%. moderate intensity statin indicated. Patient requested to discuss at follow up.  Hypertension longstanding currently uncontrolled. Blood pressure goal of <130/80 mmHg. Medication adherence suboptimal and unable to fully assess which medications she is taking at this time. Will have patient present with pill bottles to follow up.  Patient verbalized understanding of treatment plan. Total time patient counseling 30 minutes.  Follow-up:  Pharmacist on 10/15/24 PCP clinic visit on 11/09/24  Theresa Malone, PharmD, CPP Clinical Pharmacist [facility] [phone]

## 2024-10-16 ENCOUNTER — Encounter: Payer: Self-pay | Admitting: Family Medicine

## 2024-10-16 ENCOUNTER — Telehealth: Payer: Self-pay

## 2024-10-16 NOTE — Telephone Encounter (Signed)
 Can we please call patient and see why she would need this letter? And to who?

## 2024-10-16 NOTE — Telephone Encounter (Signed)
 Noted. Sent note via MyChart to patient.

## 2024-10-16 NOTE — Telephone Encounter (Signed)
 Copied from CRM #[id number]. Topic: Clinical - Medication Question >> Oct 16, 2024  9:23 AM Eleanor C wrote: Reason for CRM: Patient needs a letter stating that she is no longer insulin  dependent. She said that her physician took her off of insulin  recently and called to speak to the pharmacist but after calling [facility] I discovered the pharmacist was not in today. I let her know I would sent a note to her clinical team and they would get back with her regarding a letter. She said if it can be placed in her Mychart she can take it from there. She is hoping to hear from someone soon if this can be written. Please contact patient at [phone].

## 2024-11-09 ENCOUNTER — Ambulatory Visit: Payer: MEDICAID

## 2024-11-09 NOTE — Progress Notes (Deleted)
 IF patient agreeable okay to place Jardiance 10mg  daily for CKD & DMII and rosuvastatin 10mg  daily for hyperlipidemia  Established patient visit   Patient: Theresa Malone   DOB: 10-09-1981   43 y.o. Female  MRN: [id number] Visit Date: 11/09/2024  Today's healthcare provider: Isaiah DELENA Pepper, MD   No chief complaint on file.  Subjective    HPI  Discussed the use of AI scribe software for clinical note transcription with the patient, who gave verbal consent to proceed.  History of Present Illness      Medications: Show/hide medication list[1]  Review of Systems as noted in HPI.  {Insert previous labs (optional):23779} {See past labs  Heme  Chem  Endocrine  Serology  Results Review (optional):1}   Objective    There were no vitals taken for this visit. {Insert last BP/Wt (optional):23777}{See vitals history (optional):1}  Physical Exam   No results found for any visits on 11/09/24.  Assessment & Plan     Problem List Items Addressed This Visit   None   Assessment and Plan Assessment & Plan       No follow-ups on file.       Isaiah DELENA Pepper, MD  [facility] [phone] (phone) [phone] (fax)    [1]  Outpatient Medications Prior to Visit  Medication Sig   albuterol  (VENTOLIN  HFA) 108 (90 Base) MCG/ACT inhaler Inhale 2 puffs into the lungs every 6 (six) hours as needed for wheezing or shortness of breath.   amLODipine  (NORVASC ) 5 MG tablet Take 1 tablet (5 mg total) by mouth daily.   BD PEN NEEDLE MICRO U/F 32G X 6 MM MISC Inject into the skin daily.   clindamycin -benzoyl peroxide (BENZACLIN) gel Apply topically 2 (two) times daily.   Continuous Glucose Receiver (DEXCOM G7 RECEIVER) DEVI    Continuous Glucose Sensor (FREESTYLE LIBRE 3 PLUS SENSOR) MISC Change sensor every 15 days.   escitalopram  (LEXAPRO ) 20 MG tablet Take 2 tablets (40 mg total) by mouth daily.   gabapentin  (NEURONTIN ) 300 MG capsule Take  2 capsules (600 mg total) by mouth 3 (three) times daily.   GVOKE HYPOPEN 2-PACK 1 MG/0.2ML SOAJ Inject into the skin.   hydrOXYzine  (VISTARIL ) 25 MG capsule Take 1 capsule (25 mg total) by mouth every 8 (eight) hours as needed.   Ibuprofen -Acetaminophen  (ACETAMINOPHEN -IBUPROFEN ) 125-250 MG TABS Take 2 tablets by mouth in the morning and at bedtime.   iron  polysaccharides (NIFEREX) 150 MG capsule Take 1 capsule (150 mg total) by mouth daily.   LANTUS  SOLOSTAR 100 UNIT/ML Solostar Pen Inject 50 Units into the skin 2 (two) times daily. (Patient taking differently: Inject 30 Units into the skin 2 (two) times daily.)   meloxicam  (MOBIC ) 7.5 MG tablet Take 1 tablet (7.5 mg total) by mouth daily.   mupirocin  ointment (BACTROBAN ) 2 % Apply 1 Application topically 2 (two) times daily.   oxyCODONE  10 MG TABS Take 1 tablet (10 mg total) by mouth every 6 (six) hours as needed for severe pain (pain score 7-10).   prazosin (MINIPRESS) 2 MG capsule Take 2 mg by mouth at bedtime.   spironolactone  (ALDACTONE ) 100 MG tablet Take 1 tablet (100 mg total) by mouth daily. Take with heavy meal and PLENTY OF WATER   tirzepatide  (MOUNJARO ) 10 MG/0.5ML Pen Inject 10 mg into the skin once a week.   traZODone  (DESYREL ) 50 MG tablet TAKE 1 TABLET BY MOUTH AT BEDTIME AS NEEDED FOR SLEEP   triamcinolone  ointment (KENALOG )  0.1 % Apply 1 Application topically 2 (two) times daily. To affected areas   valsartan  (DIOVAN ) 80 MG tablet Take 1 tablet (80 mg total) by mouth daily.   No facility-administered medications prior to visit.

## 2024-11-20 ENCOUNTER — Other Ambulatory Visit: Payer: Self-pay | Admitting: Family Medicine

## 2024-11-20 DIAGNOSIS — E1122 Type 2 diabetes mellitus with diabetic chronic kidney disease: Secondary | ICD-10-CM

## 2024-11-23 ENCOUNTER — Other Ambulatory Visit: Payer: Self-pay

## 2024-11-23 DIAGNOSIS — Z794 Long term (current) use of insulin: Secondary | ICD-10-CM

## 2024-11-23 MED ORDER — ESCITALOPRAM OXALATE 20 MG PO TABS: 40.0000 mg | ORAL_TABLET | Freq: Every day | ORAL | 1 refills | Status: AC

## 2024-11-23 NOTE — Telephone Encounter (Signed)
 Requested Prescriptions  Pending Prescriptions Disp Refills   escitalopram  (LEXAPRO ) 20 MG tablet 180 tablet 1    Sig: Take 2 tablets (40 mg total) by mouth daily.     Psychiatry:  Antidepressants - SSRI Passed - 11/23/2024  3:23 PM      Passed - Completed PHQ-2 or PHQ-9 in the last 360 days      Passed - Valid encounter within last 6 months    Recent Outpatient Visits           1 month ago Type 2 diabetes mellitus with stage 3b chronic kidney disease, with long-term current use of insulin  [facility])   [facility] [place], Curtis LABOR, FNP   2 months ago Breast abscess   [facility] [place], Jon HERO, MD   3 months ago Abscess   [facility] Gareth Mliss FALCON, FNP   3 months ago Viral upper respiratory tract infection   [facility] Gareth Mliss FALCON, FNP   5 months ago Annual physical exam   [facility] [place], Janna, PA-C

## 2024-11-23 NOTE — Telephone Encounter (Signed)
 Copied from CRM #[id number]. Topic: Clinical - Prescription Issue >> Nov 23, 2024 12:46 PM Nessti S wrote: Reason for CRM: pt called in because she hasn't received prescription escitalopram  (LEXAPRO ) 20 MG tablet. >> Nov 23, 2024 12:48 PM Nessti S wrote: She would like a call back soon as possible

## 2024-12-01 DIAGNOSIS — Z794 Long term (current) use of insulin: Secondary | ICD-10-CM

## 2024-12-01 DIAGNOSIS — E1122 Type 2 diabetes mellitus with diabetic chronic kidney disease: Secondary | ICD-10-CM

## 2024-12-01 MED ORDER — TIRZEPATIDE 7.5 MG/0.5ML ~~LOC~~ SOAJ: 7.5000 mg | SUBCUTANEOUS | 1 refills | Status: AC

## 2024-12-01 MED ORDER — TIRZEPATIDE 7.5 MG/0.5ML ~~LOC~~ SOAJ: 7.5000 mg | SUBCUTANEOUS | 1 refills | Status: DC

## 2024-12-01 NOTE — Telephone Encounter (Signed)
 FYI Only or Action Required?: Action required by provider: medication refill request and clinical question for provider.  Patient was last seen in primary care on 10/08/2024 by [name], FNP.  Called Nurse Triage reporting Diarrhea.  Symptoms began yesterday.  Interventions attempted: Nothing.  Symptoms are: unchanged.  Triage Disposition: See Physician Within 24 Hours  Patient/caregiver understands and will follow disposition?: Yes    Copied from CRM #[id number]. Topic: Clinical - Red Word Triage >> Dec 01, 2024 10:04 AM Rosaria BRAVO wrote: Red Word that prompted transfer to Nurse Triage: Pt is having egg burps, uncontrollable diarrhea, and vomiting. Reason for Disposition  [1] MODERATE diarrhea (e.g., 4-6 times / day more than normal) AND [2] present > 48 hours (2 days)  Answer Assessment - Initial Assessment Questions No available appts today. Advised UC today and ED/911 if symptoms worsen. Patient verbalized understanding.  Patient requesting dosage change of Mounjaro ; reports symptoms occurred after increase to 10 mg, requesting reduce to 7mg .   1. DIARRHEA SEVERITY: How bad is the diarrhea? How many more stools have you had in the past 24 hours than normal?      5x 2. ONSET: When did the diarrhea begin?      yesterday 3. STOOL DESCRIPTION:  How loose or watery is the diarrhea? What is the stool color? Is there any blood or mucous in the stool?     Watery and loose, tan brown 4. VOMITING: Are you also vomiting? If Yes, ask: How many times in the past 24 hours?      5x 5. ABDOMEN PAIN: Are you having any abdomen pain? If Yes, ask: What does it feel like? (e.g., crampy, dull, intermittent, constant)      aching 6. ABDOMEN PAIN SEVERITY: If present, ask: How bad is the pain?  (e.g., Scale 1-10; mild, moderate, or severe)     5/10 7. ORAL INTAKE: If vomiting, Have you been able to drink liquids? How much liquids have you had in the past 24  hours?     yes 8. HYDRATION: Any signs of dehydration? (e.g., dry mouth [not just dry lips], too weak to stand, dizziness, new weight loss) When did you last urinate?     Dry mouth, urinating as usual 9. EXPOSURE: Have you traveled to a foreign country recently? Have you been exposed to anyone with diarrhea? Could you have eaten any food that was spoiled?     no 10. ANTIBIOTIC USE: Are you taking antibiotics now or have you taken antibiotics in the past 2 months?       no 11. OTHER SYMPTOMS: Do you have any other symptoms? (e.g., fever, blood in stool) denies  Protocols used: Diarrhea-A-AH

## 2024-12-01 NOTE — Telephone Encounter (Signed)
 I recommend that patient be seen in urgent care or ED. I sent a refill of her Mounjaro  7.5mg .

## 2024-12-18 ENCOUNTER — Other Ambulatory Visit: Payer: Self-pay

## 2024-12-18 ENCOUNTER — Emergency Department
Admission: EM | Admit: 2024-12-18 | Discharge: 2024-12-18 | Disposition: A | Discharge status: Home / Self Care | Payer: MEDICAID | Attending: Emergency Medicine | Admitting: Emergency Medicine

## 2024-12-18 DIAGNOSIS — E1122 Type 2 diabetes mellitus with diabetic chronic kidney disease: Secondary | ICD-10-CM | POA: Diagnosis not present

## 2024-12-18 DIAGNOSIS — L732 Hidradenitis suppurativa: Secondary | ICD-10-CM | POA: Diagnosis not present

## 2024-12-18 DIAGNOSIS — N189 Chronic kidney disease, unspecified: Secondary | ICD-10-CM | POA: Diagnosis not present

## 2024-12-18 DIAGNOSIS — I129 Hypertensive chronic kidney disease with stage 1 through stage 4 chronic kidney disease, or unspecified chronic kidney disease: Secondary | ICD-10-CM | POA: Insufficient documentation

## 2024-12-18 DIAGNOSIS — N611 Abscess of the breast and nipple: Secondary | ICD-10-CM | POA: Diagnosis present

## 2024-12-18 LAB — CBC WITH DIFFERENTIAL/PLATELET
Abs Immature Granulocytes: 0.08 K/uL — ABNORMAL HIGH (ref 0.00–0.07)
Basophils Absolute: 0 K/uL (ref 0.0–0.1)
Basophils Relative: 0 %
Eosinophils Absolute: 0.4 K/uL (ref 0.0–0.5)
Eosinophils Relative: 3 %
HCT: 27 % — ABNORMAL LOW (ref 36.0–46.0)
Hemoglobin: 8.7 g/dL — ABNORMAL LOW (ref 12.0–15.0)
Immature Granulocytes: 1 %
Lymphocytes Relative: 20 %
Lymphs Abs: 2 K/uL (ref 0.7–4.0)
MCH: 26.8 pg (ref 26.0–34.0)
MCHC: 32.2 g/dL (ref 30.0–36.0)
MCV: 83.1 fL (ref 80.0–100.0)
Monocytes Absolute: 0.8 K/uL (ref 0.1–1.0)
Monocytes Relative: 8 %
Neutro Abs: 6.9 K/uL (ref 1.7–7.7)
Neutrophils Relative %: 68 %
Platelets: 344 K/uL (ref 150–400)
RBC: 3.25 MIL/uL — ABNORMAL LOW (ref 3.87–5.11)
RDW: 14.2 % (ref 11.5–15.5)
WBC: 10.2 K/uL (ref 4.0–10.5)
nRBC: 0 % (ref 0.0–0.2)

## 2024-12-18 LAB — BASIC METABOLIC PANEL WITH GFR
Anion gap: 12 (ref 5–15)
BUN: 25 mg/dL — ABNORMAL HIGH (ref 6–20)
CO2: 22 mmol/L (ref 22–32)
Calcium: 8.7 mg/dL — ABNORMAL LOW (ref 8.9–10.3)
Chloride: 98 mmol/L (ref 98–111)
Creatinine, Ser: 1.59 mg/dL — ABNORMAL HIGH (ref 0.44–1.00)
GFR, Estimated: 41 mL/min — ABNORMAL LOW
Glucose, Bld: 246 mg/dL — ABNORMAL HIGH (ref 70–99)
Potassium: 4 mmol/L (ref 3.5–5.1)
Sodium: 132 mmol/L — ABNORMAL LOW (ref 135–145)

## 2024-12-18 MED ORDER — ALPRAZOLAM 0.5 MG PO TABS
0.5000 mg | ORAL_TABLET | Freq: Once | ORAL | Status: AC
  Administered 2024-12-18: 0.5 mg via ORAL
  Filled 2024-12-18: qty 1

## 2024-12-18 MED ORDER — OXYCODONE-ACETAMINOPHEN 5-325 MG PO TABS
1.0000 | ORAL_TABLET | Freq: Once | ORAL | Status: AC
  Administered 2024-12-18: 1 via ORAL
  Filled 2024-12-18: qty 1

## 2024-12-18 MED ORDER — LIDOCAINE HCL (PF) 1 % IJ SOLN
10.0000 mL | Freq: Once | INTRAMUSCULAR | Status: AC
  Administered 2024-12-18: 10 mL
  Filled 2024-12-18: qty 10

## 2024-12-18 MED ORDER — OXYCODONE-ACETAMINOPHEN 5-325 MG PO TABS: 1.0000 | ORAL_TABLET | Freq: Three times a day (TID) | ORAL | 0 refills | Status: AC | PRN

## 2024-12-18 NOTE — ED Notes (Addendum)
 Wound dressed after procedure. Iodoform in place.  Patient tolerated procedure well. Husband at bedside.

## 2024-12-18 NOTE — Discharge Instructions (Signed)
 Keep the wound clean, dry, and covered.  Apply warm compresses over the dressing to help remote healing.  Remove the wound packing in 3 days time.  Take the antibiotics prescribed by your dermatologist as directed.  Follow-up with your dermatologist as scheduled.  Return to the ED if needed.

## 2024-12-18 NOTE — ED Provider Notes (Signed)
 SABRA

## 2024-12-18 NOTE — ED Triage Notes (Signed)
 Pt to ED for abscess with purulent drainage under L breast since 6 days. Was placed on doxycycline  by PCP 1 week ago. Currently taking the doxy (14 day course) and abscess not getting better. Tissue around the abscess is hard and red. Was prescribed percocet also but has mostly just been taking tylenol . Pt has DM, takes Manjouro.

## 2025-01-18 ENCOUNTER — Encounter: Payer: MEDICAID | Admitting: Nurse Practitioner
